# Patient Record
Sex: Female | Born: 1955 | Race: White | Hispanic: No | Marital: Married | Smoking: Never smoker
Health system: Southern US, Community
[De-identification: ages and names within clinical notes are randomized; demographics above are authoritative.]

## PROBLEM LIST (undated history)

## (undated) DIAGNOSIS — D649 Anemia, unspecified: Secondary | ICD-10-CM

## (undated) DIAGNOSIS — M1990 Unspecified osteoarthritis, unspecified site: Secondary | ICD-10-CM

## (undated) DIAGNOSIS — E079 Disorder of thyroid, unspecified: Secondary | ICD-10-CM

## (undated) DIAGNOSIS — T7840XA Allergy, unspecified, initial encounter: Secondary | ICD-10-CM

## (undated) DIAGNOSIS — M25473 Effusion, unspecified ankle: Secondary | ICD-10-CM

## (undated) DIAGNOSIS — Z87442 Personal history of urinary calculi: Secondary | ICD-10-CM

## (undated) DIAGNOSIS — G4730 Sleep apnea, unspecified: Secondary | ICD-10-CM

## (undated) DIAGNOSIS — E039 Hypothyroidism, unspecified: Secondary | ICD-10-CM

## (undated) DIAGNOSIS — E669 Obesity, unspecified: Secondary | ICD-10-CM

## (undated) DIAGNOSIS — L309 Dermatitis, unspecified: Secondary | ICD-10-CM

## (undated) DIAGNOSIS — C801 Malignant (primary) neoplasm, unspecified: Secondary | ICD-10-CM

## (undated) HISTORY — PX: COLONOSCOPY: SHX174

## (undated) HISTORY — PX: KNEE ARTHROSCOPY: SUR90

## (undated) HISTORY — DX: Unspecified osteoarthritis, unspecified site: M19.90

## (undated) HISTORY — DX: Allergy, unspecified, initial encounter: T78.40XA

## (undated) HISTORY — DX: Obesity, unspecified: E66.9

## (undated) HISTORY — DX: Sleep apnea, unspecified: G47.30

## (undated) HISTORY — DX: Disorder of thyroid, unspecified: E07.9

## (undated) HISTORY — PX: POLYPECTOMY: SHX149

---

## 1999-11-21 HISTORY — PX: BREAST BIOPSY: SHX20

## 2000-11-27 ENCOUNTER — Encounter: Admission: RE | Admit: 2000-11-27 | Discharge: 2000-11-27 | Payer: Self-pay | Admitting: Gynecology

## 2000-11-27 ENCOUNTER — Encounter: Payer: Self-pay | Admitting: Gynecology

## 2000-11-29 ENCOUNTER — Other Ambulatory Visit: Admission: RE | Admit: 2000-11-29 | Discharge: 2000-11-29 | Payer: Self-pay | Admitting: Gynecology

## 2002-05-27 ENCOUNTER — Encounter: Admission: RE | Admit: 2002-05-27 | Discharge: 2002-05-27 | Payer: Self-pay | Admitting: Gynecology

## 2002-05-27 ENCOUNTER — Encounter: Payer: Self-pay | Admitting: Gynecology

## 2002-06-04 ENCOUNTER — Other Ambulatory Visit: Admission: RE | Admit: 2002-06-04 | Discharge: 2002-06-04 | Payer: Self-pay | Admitting: Gynecology

## 2004-12-28 ENCOUNTER — Other Ambulatory Visit: Admission: RE | Admit: 2004-12-28 | Discharge: 2004-12-28 | Payer: Self-pay | Admitting: Gynecology

## 2005-01-11 ENCOUNTER — Encounter: Admission: RE | Admit: 2005-01-11 | Discharge: 2005-01-11 | Payer: Self-pay | Admitting: Gynecology

## 2007-07-09 ENCOUNTER — Encounter: Admission: RE | Admit: 2007-07-09 | Discharge: 2007-07-09 | Payer: Self-pay | Admitting: Gynecology

## 2007-07-11 ENCOUNTER — Other Ambulatory Visit: Admission: RE | Admit: 2007-07-11 | Discharge: 2007-07-11 | Payer: Self-pay | Admitting: Gynecology

## 2007-11-21 HISTORY — PX: CHOLECYSTECTOMY: SHX55

## 2007-11-27 ENCOUNTER — Inpatient Hospital Stay (HOSPITAL_COMMUNITY): Admission: EM | Admit: 2007-11-27 | Discharge: 2007-12-03 | Payer: Self-pay | Admitting: Emergency Medicine

## 2007-11-28 ENCOUNTER — Encounter (INDEPENDENT_AMBULATORY_CARE_PROVIDER_SITE_OTHER): Payer: Self-pay | Admitting: General Surgery

## 2008-01-03 ENCOUNTER — Encounter (INDEPENDENT_AMBULATORY_CARE_PROVIDER_SITE_OTHER): Payer: Self-pay | Admitting: *Deleted

## 2008-01-03 ENCOUNTER — Encounter: Admission: RE | Admit: 2008-01-03 | Discharge: 2008-01-03 | Payer: Self-pay | Admitting: General Surgery

## 2008-05-27 ENCOUNTER — Encounter: Admission: RE | Admit: 2008-05-27 | Discharge: 2008-05-27 | Payer: Self-pay | Admitting: General Surgery

## 2008-05-29 ENCOUNTER — Encounter (INDEPENDENT_AMBULATORY_CARE_PROVIDER_SITE_OTHER): Payer: Self-pay | Admitting: *Deleted

## 2008-05-29 ENCOUNTER — Encounter: Admission: RE | Admit: 2008-05-29 | Discharge: 2008-05-29 | Payer: Self-pay | Admitting: General Surgery

## 2008-09-22 ENCOUNTER — Encounter (INDEPENDENT_AMBULATORY_CARE_PROVIDER_SITE_OTHER): Payer: Self-pay | Admitting: *Deleted

## 2008-09-22 ENCOUNTER — Encounter: Admission: RE | Admit: 2008-09-22 | Discharge: 2008-09-22 | Payer: Self-pay | Admitting: Gastroenterology

## 2009-10-08 ENCOUNTER — Ambulatory Visit: Payer: Self-pay | Admitting: Gastroenterology

## 2009-10-08 DIAGNOSIS — R933 Abnormal findings on diagnostic imaging of other parts of digestive tract: Secondary | ICD-10-CM | POA: Insufficient documentation

## 2009-10-12 ENCOUNTER — Ambulatory Visit (HOSPITAL_COMMUNITY): Admission: RE | Admit: 2009-10-12 | Discharge: 2009-10-12 | Payer: Self-pay | Admitting: Gastroenterology

## 2009-11-20 HISTORY — PX: ROTATOR CUFF REPAIR: SHX139

## 2010-12-11 ENCOUNTER — Encounter: Payer: Self-pay | Admitting: Family Medicine

## 2010-12-11 ENCOUNTER — Encounter: Payer: Self-pay | Admitting: Gastroenterology

## 2011-01-24 ENCOUNTER — Encounter (INDEPENDENT_AMBULATORY_CARE_PROVIDER_SITE_OTHER): Payer: Self-pay | Admitting: *Deleted

## 2011-01-31 NOTE — Letter (Signed)
Summary: Pre Visit Letter Revised  [facility]  [address] [place], [place] [address]   Phone: [phone]  Fax: [phone]        01/24/2011 MRN: [id number] Carol [name] [address] [place], [place]  [address]             Procedure Date:  03/10/2011 @ 9:00   Direct colon-Dr. Christella Hartigan   Welcome to the Gastroenterology Division at [facility].    You are scheduled to see a nurse for your pre-procedure visit on 02/23/2011 at 4:30 on the 3rd floor at [facility], [address].  We ask that you try to arrive at our office 15 minutes prior to your appointment time to allow for check-in.  Please take a minute to review the attached form.  If you answer "Yes" to one or more of the questions on the first page, we ask that you call the person listed at your earliest opportunity.  If you answer "No" to all of the questions, please complete the rest of the form and bring it to your appointment.    Your nurse visit will consist of discussing your medical and surgical history, your immediate family medical history, and your medications.   If you are unable to list all of your medications on the form, please bring the medication bottles to your appointment and we will list them.  We will need to be aware of both prescribed and over the counter drugs.  We will need to know exact dosage information as well.    Please be prepared to read and sign documents such as consent forms, a financial agreement, and acknowledgement forms.  If necessary, and with your consent, a friend or relative is welcome to sit-in on the nurse visit with you.  Please bring your insurance card so that we may make a copy of it.  If your insurance requires a referral to see a specialist, please bring your referral form from your primary care physician.  No co-pay is required for this nurse visit.     If you cannot keep your appointment, please call [phone] to cancel or reschedule prior to your  appointment date.  This allows [place] the opportunity to schedule an appointment for another patient in need of care.    Thank you for choosing [facility] for your medical needs.  We appreciate the opportunity to care for you.  Please visit [place] at our website  to learn more about our practice.  Sincerely, The Gastroenterology Division

## 2011-02-21 ENCOUNTER — Telehealth: Payer: Self-pay | Admitting: Gastroenterology

## 2011-02-21 NOTE — Telephone Encounter (Signed)
Pt saw Dr Kinnie Scales three years ago and was told to have a colon in three years.  She will bring her records with her to the previsit appt on Friday

## 2011-02-23 ENCOUNTER — Ambulatory Visit (AMBULATORY_SURGERY_CENTER): Payer: Managed Care, Other (non HMO) | Admitting: *Deleted

## 2011-02-23 VITALS — Ht 62.0 in | Wt 278.6 lb

## 2011-02-23 DIAGNOSIS — Z1211 Encounter for screening for malignant neoplasm of colon: Secondary | ICD-10-CM

## 2011-02-23 MED ORDER — PEG-KCL-NACL-NASULF-NA ASC-C 100 G PO SOLR: ORAL | Status: DC

## 2011-03-05 ENCOUNTER — Telehealth: Payer: Self-pay | Admitting: Gastroenterology

## 2011-03-05 NOTE — Telephone Encounter (Signed)
Received records dated 3 years ago: Colonoscopy dated 06/2007 with Dr. Kinnie Scales; diverticulosis, hemorrhoids; 2 lipomas; 2 polyps (one was 10mm, adenomatous on pathology); recall set for 3 years

## 2011-03-07 ENCOUNTER — Encounter: Payer: Self-pay | Admitting: Gastroenterology

## 2011-03-10 ENCOUNTER — Ambulatory Visit (AMBULATORY_SURGERY_CENTER): Payer: Managed Care, Other (non HMO) | Admitting: Gastroenterology

## 2011-03-10 ENCOUNTER — Encounter: Payer: Self-pay | Admitting: Gastroenterology

## 2011-03-10 VITALS — BP 112/71 | HR 78 | Temp 99.0°F | Resp 18 | Ht 62.0 in | Wt 274.0 lb

## 2011-03-10 DIAGNOSIS — D126 Benign neoplasm of colon, unspecified: Secondary | ICD-10-CM

## 2011-03-10 DIAGNOSIS — Z86010 Personal history of colonic polyps: Secondary | ICD-10-CM

## 2011-03-10 DIAGNOSIS — D1779 Benign lipomatous neoplasm of other sites: Secondary | ICD-10-CM

## 2011-03-10 DIAGNOSIS — D128 Benign neoplasm of rectum: Secondary | ICD-10-CM

## 2011-03-10 DIAGNOSIS — Z1211 Encounter for screening for malignant neoplasm of colon: Secondary | ICD-10-CM

## 2011-03-10 DIAGNOSIS — K635 Polyp of colon: Secondary | ICD-10-CM

## 2011-03-10 DIAGNOSIS — D129 Benign neoplasm of anus and anal canal: Secondary | ICD-10-CM

## 2011-03-10 MED ORDER — SODIUM CHLORIDE 0.9 % IV SOLN: 500.0000 mL | INTRAVENOUS | Status: DC

## 2011-03-10 NOTE — Patient Instructions (Signed)
Two small polyps were removed from your colon today.  Three lipomas seen, that were previously noted by Dr. Kinnie Scales.  Given your personal history of adenomatous polyps, you will need a repeat colonoscopy in 5 years.  Information handout about polyps given to patient and their care partner.  Please call with any questions or concerns.

## 2011-03-13 ENCOUNTER — Telehealth: Payer: Self-pay

## 2011-03-13 NOTE — Telephone Encounter (Signed)
I called home # and the line was busy.  I called her cell home and ID was on the message, left message for pt to call if any questions or concerns.  MAW

## 2011-04-04 NOTE — Consult Note (Signed)
[name], [name] NO.:  [id number]   MEDICAL RECORD NO.:  [id number]          PATIENT TYPE:  EMS   LOCATION:  MAJO                         FACILITY:  [facility]   PHYSICIAN:  Thomas A. Cornett, M.D.DATE OF BIRTH:  01-01-1956   DATE OF CONSULTATION:  11/27/2007  DATE OF DISCHARGE:                                 CONSULTATION   REASON FOR CONSULTATION:  Right upper quadrant pain.   HISTORY OF PRESENT ILLNESS:  The patient is a 55 year old female with a  7-day history of mild right upper quadrant and epigastric pain.  Last  night it became severe with severe, stabbing, sharp, right upper  quadrant pain radiating through her back into her right shoulder on a  scale of 10 to 10 associated with nausea and vomiting.  Nothing made it  better or worse.  She came to the emergency room where ultrasound  revealed thickened, distended gallbladder with gallstones worrisome for  acute cholecystitis.  I was asked to see the patient at the request of  the emergency room doctor for the above.  She had mild pain around [place]  Year's day but it has gotten much worse, and she thought this was going  to be secondary to her reflux.   PAST MEDICAL HISTORY:  1. Questionable borderline diabetes mellitus, diet controlled.  2. Gastroesophageal reflux disease.   PAST SURGICAL HISTORY:  History of right breast biopsy for benign  disease.   FAMILY HISTORY:  Noncontributory.   SOCIAL HISTORY:  Denies tobacco or alcohol use.  She is married.   DRUG ALLERGIES:  None.   MEDICATIONS:  Currently Vicodin given by her primary care doctor  yesterday for her epigastric pain.   REVIEW OF SYSTEMS:  A 15-point review of systems otherwise is negative  except for that stated above.   PHYSICAL EXAMINATION:  VITAL SIGNS:  Temperature 97.  Pulse 69.  Blood  pressure 141/80.  GENERAL APPEARANCE:  Pleasant female in no apparent distress.  HEENT:  Extraocular movements are intact.  No evidence of  scleral  icterus.  NECK:  Supple, nontender.  Trachea midline.  No mass.  CHEST:  Lungs sound clear bilaterally.  Chest wall motion normal.  CARDIOVASCULAR:  Regular rate and rhythm without rub, murmur or gallop.  EXTREMITIES:  Well perfused and warm.  ABDOMEN:  Tender right upper quadrant.  Positive Murphy's sign.  No  mass.  No hernia.  EXTREMITIES:  Muscle tone normal.  Range of motion normal.  NEUROLOGIC:  Motor and sensory function grossly intact.  Glasgow Coma  Scale 15.   DIAGNOSTIC STUDIES:  Ultrasound reveals gallstones and distended,  thickened gallbladder worrisome for acute cholecystitis.  She has a  white count of 12,800, hemoglobin of 14.6.  Platelet count is 140,000.  Urinalysis is within normal limits.  She has a sodium of 132, a glucose  of 185, BUN of 9 and creatinine of 0.6.  Liver function studies appear  to be within normal limits.  Lipase is 23.   Please note, on ultrasound there was a questionable left liver mass  measuring roughly 1  cm.   IMPRESSION:  1. Acute cholecystitis.  2. Borderline diabetes mellitus.  3. Questionable left liver mass by ultrasound.   PLAN:  Ms. Klassen does have acute cholecystitis.  We will admit her for  IV fluids, antibiotics, and laparoscopic cholecystectomy by Dr.  Abbey Chatters who is the doctor of the week.  I told her about this  possibility of having a left liver mass, and that will need to be  further evaluated with laparoscopy and possible CT scan.  She  understands the above and agrees.      Thomas A. Cornett, M.D.  Electronically Signed     TAC/MEDQ  D:  11/27/2007  T:  11/27/2007  Job:  [id number]   cc:   Paula Libra, MD

## 2011-04-04 NOTE — Discharge Summary (Signed)
NAMEMARIUM, RAGAN NO.:  [id number]   MEDICAL RECORD NO.:  [id number]          PATIENT TYPE:  INP   LOCATION:  5018                         FACILITY:  [facility]   PHYSICIAN:  Ardeth Sportsman, MD     DATE OF BIRTH:  08-Jul-1956   DATE OF ADMISSION:  11/27/2007  DATE OF DISCHARGE:  12/03/2007                               DISCHARGE SUMMARY   DISCHARGE PHYSICIAN:  Dr. Michaell Cowing.   OPERATIVE PHYSICIAN:  Dr. Abbey Chatters.   CHIEF COMPLAINT/REASON FOR ADMISSION:  Ms. Davidson is a 51-year female  patient with 7 days of mild right upper quadrant epigastric pain.  Twenty four hours prior to presentation, the patient developed  unrelenting, severe pain of a stabbing sharp quality in the right upper  quadrant radiating through the back and into the shoulder, rated as  10/10.  She was experiencing nausea and vomiting, was unable to obtain  relief presented to the ER where she was found to have significant right  upper quadrant pain on exam.  Her white count was 12,800.  Her LFTs were  normal and ultrasound of the abdomen did reveal gallstones and a  distended thickened gallbladder worse for acute cholecystitis.  Therefore, the patient was admitted with a diagnosis of cholelithiasis  and acute cholecystitis.  In addition, there was a questionable left  liver mass seen on ultrasound.   HOSPITAL COURSE:  The patient was admitted to the general floor where  she was placed on bowel rest, IV fluids and empiric antibiotic therapy  with Cipro to cover enteric pathogens.  Subsequently, care was assumed  by Dr. Abbey Chatters who discussed risks and benefits of surgery with the  patient, and the patient agreed to proceed with surgical intervention.   November 28, 2007, the patient was taken to the OR by Dr. Abbey Chatters where  she underwent an attempted laparoscopic cholecystectomy.  She was found  to have a massive (near football size) gallbladder, so therefore she was  converted to an open  cholecystectomy.  During the procedure, attempt was  made to fully exam the liver to determine possible etiology for the  unusual finding on ultrasound on the liver, but due to the patient's  body habitus, size of the gallbladder and inflammatory changes and acute  cholecystitis, complete visualization of the liver was unable to be  achieved.  The patient otherwise tolerated open procedure well, was  initially sent to the post anesthesia care unit and then back to the  general floor to recover.  Intraoperatively, she had one Blake drain to  the JP bulb suction placed.   In the postoperative period, the patient did well.  Because she  experienced an open procedure, her diet was advanced slowly.  JP drain  remained stable without any bilious output.  She was having  serosanguineous output but a large volume.  She remained afebrile on  Cipro.  Vital signs were otherwise stable.  The pathology from the  procedure showed no evidence of malignancy and she did have fatty liver  changes noted with a sampling of liver tissue that was sent  down with  the pathology.  By November 30, 2007, the patient's white count had  normalized at 6900.   By postop day #4, the patient was ambulating without difficulty.  She  was tolerating Vicodin as needed.  She was given Toradol p.r.n. for pain  management.  At this point, she was tolerating a full liquid diet.  By  postop her day #5, date of discharge, the patient's vital signs were  stable.  Her abdominal exam was benign.  Her staples were clean, dry and  intact in the right upper quadrant umbilical region.  JP drainage was  still serosanguineous in large volumes.  This was left in place.  She  was tolerating a solid diet and passing bowel movements and was  otherwise deemed appropriate for discharge home.   The patient reports that prior to admission she had been on the Nutri-  System nutritional program and had experienced a 60-pound weight loss,  advised  the patient to wait a minimum of 2 weeks, preferably 4 weeks  before reinitiating calorie restricted diet to assure she had adequate  wound healing.   DISCHARGE DIAGNOSES:  1. Acute cholecystitis and cholelithiasis.  2. Status post laparoscopic converted to open cholecystectomy      secondary to massive gallbladder size.  3. Questionable left liver mass seen on ultrasound, unable to      visualize in OR.  4. Obesity.   DISCHARGE MEDICATIONS:  The patient was not on any medications prior to  presenting the hospital.   NEW MEDICATIONS:  1. Vicodin 5/325 1-2 tablets q.4 h p.r.n. pain.  2. Over-the-counter ibuprofen 1-4 tablets q.8 h p.r.n. pain, take with      food.  3. Over-the-counter stool softener twice daily to prevent      constipation.  Stop if you develop diarrhea.  4. Over-the-counter MiraLax one-quarter to one capsule daily to      prevent constipation.   DISCHARGE INSTRUCTIONS:  1. Diet:  No restrictions other than recommendations to not resume      Nutri-System until healed from surgery.  2. Activity:  Increase activity slowly.  May walk up steps.  Sponge      bathe only until drain is removed.  No lifting greater than 10      pounds for 6 weeks from date of surgery.  No driving for 4 weeks or      until released by Dr. Abbey Chatters.  Return to work in 6 weeks from      date of surgery.  Please see attached work note.  Final      determination per Dr. Abbey Chatters.   FOLLOW-UP APPOINTMENTS:  She is to see Dr. Abbey Chatters on Friday, January  16 to 4:00 p.m. for evaluation for a JP drain removal as well as for  removal of wound staples.   OTHER INSTRUCTIONS:  1. Wound care.  The patient is to empty the JP drain and record and      bring this information to her follow-up visit.  She is to pat her      staple area dry.  2.  She is to call the surgeon if she develops:      a.     Fever greater than 101 degrees Fahrenheit.      b.     New or increased belly pain.      c.      Redness or drainage from wounds or change in the character  of the drain, output or color from the JP drain.      d.     Nausea, vomiting or diarrhea.     Allison L. Eliott Nine, MD  Electronically Signed   ALE/MEDQ  D:  12/03/2007  T:  12/03/2007  Job:  [id number]   cc:   Adolph Pollack, M.D.

## 2011-04-04 NOTE — Op Note (Signed)
Carol [name], [name]:  [id number]   MEDICAL RECORD NO.:  [id number]          PATIENT TYPE:  INP   LOCATION:  5018                         FACILITY:  [facility]   PHYSICIAN:  Adolph Pollack, M.D.DATE OF BIRTH:  [birth year]   DATE OF PROCEDURE:  11/28/2007  DATE OF DISCHARGE:                               OPERATIVE REPORT   PREOPERATIVE DIAGNOSIS:  Acute cholecystitis.   POSTOPERATIVE DIAGNOSIS:  Acute cholecystitis.   PROCEDURE:  Laparoscopic converted to open cholecystectomy.   SURGEON:  Adolph Pollack, M.D.   ASSISTANT:  Violeta Gelinas, M.D.   ANESTHESIA:  General.   INDICATIONS:  [name] is a 55 year old female who had a 7-day history  of mild right upper quadrant pain that became very severe the night of  her admission two nights ago.  She subsequently was admitted and  diagnosed with acute cholecystitis and is now brought to the operating  room for cholecystectomy.  Procedure and risks have been discussed with  her preoperatively.   TECHNIQUE:  She is brought to the operating room and placed on the  operating table.  General anesthetic was administered.  The abdominal  wall was sterilely prepped and draped.  Small subumbilical incision was  made through skin, subcutaneous tissue, fascia and peritoneum entering  the peritoneal cavity under direct vision.  A pursestring suture of 0-0  Vicryl placed around the fascial edges.  A Hasson trocar was introduced  into the peritoneal cavity and pneumoperitoneum was created by  insufflation of CO2 gas.   Next laparoscope was introduced and she had a very large distended very  edematous and erythematous gallbladder.  A 10 mm trocar was placed in  the epigastric incision and two 5 mm trocars placed in right lateral  abdomen.  She is placed in reverse Trendelenburg position and right side  tilted slightly up.   The gallbladder was then decompressed of very thick bile.  The fundus  was grasped and  retracted toward the right shoulder.  There was  significant adhesions between the omentum and the gallbladder and using  hydrodissection and careful blunt dissection on the gallbladder I slowly  took these down.  There is bleeding from these.  We continued to do this  to a point that there was a fairly thick rind versus duodenum.  I could  not separate this safely.  At this point I decided to convert to an open  procedure.   I removed the 10 mm trocar in epigastrium and made a right subcostal  incision dividing the skin, subcutaneous tissue and fascial layers,  muscle and peritoneum entering the peritoneal cavity.  Bookwalter  retractor was used for exposure.   Following this we inspected the area and noted that the tissue that was  extremely adherent to the infundibulum and part of the body was just  inflammatory peel.  This was taken down carefully bluntly.  There was  very firm induration down by the infundibular area.   I then inspected the dome of the gallbladder began at the fundus and  which was partially intrahepatic and using  electrocautery was able to  dissect this free from the liver controlling bleeding with  electrocautery and direct pressure applied laparotomy packs.  I used  very careful blunt dissection and when I got down to the infundibulum I  was able to identify the infundibulum and retract it laterally.  I then  saw some small anterior and posterior branches of the cystic artery  which were clipped and divided.  This left me with a singular short  cystic duct structure and I could identify the common bile duct as well.  Her liver function tests were normal.  I felt I had adequate  visualization of her anatomy.  The cystic duct was then clipped and  ligated with Vicryl suture and divided.  The gallbladder was extremely  large and removed from the field.   Following this I irrigated out the hepatic fossa and controlled bleeding  with electrocautery.  Surgicel was  then placed.  There was no bile leak  or further bleeding.   I removed the 5 mm trocars and then through the lateral 5 mm incision  placed 19 Blake drain.  I put it into the near the gallbladder fossa and  tied it and anchored it to the skin with 3-0 nylon suture.  I then  removed the subumbilical trocar and I closed the fascial defect by tying  up and tying down the pursestring suture.  I evacuated as much  irrigation fluid as possible and requested needle, sponge, instrument  count which was reportedly correct.   I then closed the fascia in two layers using running #1 PDS suture.  Subcutaneous tissue was irrigated.  All skin incisions were closed with  staples and sterile dressings were applied.   She tolerated procedure without apparent complications and was taken to  recovery in satisfactory condition.      Adolph Pollack, M.D.  Electronically Signed     TJR/MEDQ  D:  11/28/2007  T:  11/28/2007  Job:  [id number]

## 2011-08-10 LAB — BASIC METABOLIC PANEL
BUN: 6
GFR calc Af Amer: >60
GFR calc non Af Amer: >60
Potassium: 3.5
Sodium: 140

## 2011-08-10 LAB — COMPREHENSIVE METABOLIC PANEL
ALT: 19
AST: 17
Alkaline Phosphatase: 92
BUN: 3 — ABNORMAL LOW
CO2: 22
Chloride: 101
Chloride: 102
Creatinine, Ser: 0.6
GFR calc non Af Amer: >60
Glucose, Bld: 148 — ABNORMAL HIGH
Potassium: 3.7
Total Bilirubin: 0.7
Total Bilirubin: 0.8

## 2011-08-10 LAB — CBC
HCT: 28.6 — ABNORMAL LOW
HCT: 31.4 — ABNORMAL LOW
HCT: 41.7
Hemoglobin: 10 — ABNORMAL LOW
MCV: 88.7
Platelets: 186
Platelets: 240
RBC: 3.52 — ABNORMAL LOW
RBC: 4.74
RDW: 12.9
WBC: 12.8 — ABNORMAL HIGH
WBC: 8.5

## 2011-08-10 LAB — DIFFERENTIAL
Basophils Absolute: 0
Basophils Absolute: 0
Basophils Relative: 0
Basophils Relative: 0
Eosinophils Relative: 0
Monocytes Absolute: 0.4
Neutro Abs: 11.5 — ABNORMAL HIGH
Neutro Abs: 5
Neutrophils Relative %: 73

## 2011-08-10 LAB — URINALYSIS, ROUTINE W REFLEX MICROSCOPIC
Ketones, ur: 40 — AB
Nitrite: NEGATIVE
Urobilinogen, UA: 1

## 2011-08-10 LAB — POCT CARDIAC MARKERS
Operator id: 282201
Troponin i, poc: <0.05

## 2011-08-10 LAB — LIPASE, BLOOD
Lipase: 20
Lipase: 23

## 2011-08-17 NOTE — Progress Notes (Signed)
Addended by: Maple Hudson on: 08/17/2011 06:37 PM   Modules accepted: Orders, Level of Service

## 2012-01-02 ENCOUNTER — Other Ambulatory Visit: Payer: Self-pay | Admitting: Gynecology

## 2012-07-09 ENCOUNTER — Encounter: Payer: Managed Care, Other (non HMO) | Attending: Family Medicine | Admitting: *Deleted

## 2012-07-09 ENCOUNTER — Encounter: Payer: Self-pay | Admitting: *Deleted

## 2012-07-09 DIAGNOSIS — E119 Type 2 diabetes mellitus without complications: Secondary | ICD-10-CM | POA: Insufficient documentation

## 2012-07-09 DIAGNOSIS — Z713 Dietary counseling and surveillance: Secondary | ICD-10-CM | POA: Insufficient documentation

## 2012-07-09 NOTE — Patient Instructions (Addendum)
Plan: Aim for 3 Carbs (45 grams) per meal +/- 1 either way Aim for 0-1 Carb per snack if hungry Consider reading food labels for total carb and fat grams of foods Consider ways to increase activity level in small time increments daily, to be discussed in more detail at next visit

## 2012-07-09 NOTE — Progress Notes (Signed)
  Medical Nutrition Therapy:  Appt start time: 0800 end time:  0900.  Assessment:  Primary concerns today: patient here for education on pre-diabetes. 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 She lives with her husband and 2 grown children along with a 57 year old grandchild. She states she does have a meter and is testing every AM with current average range of 135-150 mg/dl. No activity at this time. She is having foot pain on one foot so walking is painful right now. She tends to eat more on the weekends as she is less busyl  MEDICATIONS: see list  Current A1c on 05/29/2012 = 6.6%  DIETARY INTAKE: Usual eating pattern includes 3 meals and 0-2 snacks per day.  Everyday foods include meats, green vegetables, pasta.  Avoided foods include milk, cheese, beans some vegetables.    24-hr recall:  B ( AM): [employer] cereal with fruit OR scrambled eggs with 2 toast with butter OR 2 toast and butter, water Snk ( AM): not usually unless brought into office (donuts etc)  L ( PM): bring left overs from dinner: casserole, sandwich, etc, occasionally fruit Snk ( PM): PNB crackers or pretzels from vending machine and every other day a Diet soda D ( PM): meat or pasta, bread during the week, on weekends adds vegetables Snk ( PM): occasionally ice cream Beverages: water and diet soda occasionally  Usual physical activity: none due to work schedule and foot pain   Estimated energy needs: 1500 calories 170 g carbohydrates 112 g protein 42 g fat  Progress Towards Goal(s):  In progress.   Nutritional Diagnosis:  NI-1.5 Excessive energy intake As related to activity level.  As evidenced by BMI of 49.9.    Intervention:  Nutrition counseling and diabetes education initiated. Discussed basic physiology of diabetes, SMBG and rationale of checking BG at alternate times of day, A1c, Carb Counting and reading food labels, and benefits  of increased activity.  Plan: Aim for 3 Carbs (45 grams) per meal +/- 1 either way Aim for 0-1 Carb per snack if hungry Consider reading food labels for total carb and fat grams of foods Consider ways to increase activity level in small time increments daily, to be discussed in more detail at next visit  Handouts given during visit include: Living Well with Diabetes Carb Counting and Food Label handouts Meal Plan Card Arm Chair Exercise Book and Calorie King APP information  Monitoring/Evaluation:  Dietary intake, exercise, reading food labels, and body weight in 4 week(s).

## 2012-08-06 ENCOUNTER — Ambulatory Visit: Payer: Managed Care, Other (non HMO) | Admitting: *Deleted

## 2013-01-06 ENCOUNTER — Other Ambulatory Visit: Payer: Self-pay | Admitting: Family Medicine

## 2013-01-06 DIAGNOSIS — Z1231 Encounter for screening mammogram for malignant neoplasm of breast: Secondary | ICD-10-CM

## 2013-01-09 ENCOUNTER — Other Ambulatory Visit: Payer: Self-pay | Admitting: Gynecology

## 2013-01-15 ENCOUNTER — Ambulatory Visit: Payer: Managed Care, Other (non HMO)

## 2013-01-16 ENCOUNTER — Encounter (HOSPITAL_BASED_OUTPATIENT_CLINIC_OR_DEPARTMENT_OTHER): Payer: Self-pay | Admitting: *Deleted

## 2013-01-16 NOTE — Progress Notes (Signed)
To [facility] at 0615- [place],Ekg on arrival-Npo after Mn-may take levothyroxine with small amt water that am.Instructed to bring cpap and mask.

## 2013-01-20 ENCOUNTER — Ambulatory Visit
Admission: RE | Admit: 2013-01-20 | Discharge: 2013-01-20 | Disposition: A | Discharge status: Home / Self Care | Payer: Managed Care, Other (non HMO) | Source: Ambulatory Visit | Attending: Family Medicine | Admitting: Family Medicine

## 2013-01-20 DIAGNOSIS — Z1231 Encounter for screening mammogram for malignant neoplasm of breast: Secondary | ICD-10-CM

## 2013-01-21 ENCOUNTER — Encounter (HOSPITAL_BASED_OUTPATIENT_CLINIC_OR_DEPARTMENT_OTHER): Payer: Self-pay | Admitting: *Deleted

## 2013-01-21 ENCOUNTER — Ambulatory Visit (HOSPITAL_BASED_OUTPATIENT_CLINIC_OR_DEPARTMENT_OTHER)
Admission: RE | Admit: 2013-01-21 | Discharge: 2013-01-21 | Disposition: A | Discharge status: Home / Self Care | Payer: Managed Care, Other (non HMO) | Source: Ambulatory Visit | Attending: Gynecology | Admitting: Gynecology

## 2013-01-21 ENCOUNTER — Encounter (HOSPITAL_BASED_OUTPATIENT_CLINIC_OR_DEPARTMENT_OTHER)
Admission: RE | Disposition: A | Discharge status: Home / Self Care | Payer: Self-pay | Source: Ambulatory Visit | Attending: Gynecology

## 2013-01-21 ENCOUNTER — Other Ambulatory Visit: Payer: Self-pay | Admitting: Family Medicine

## 2013-01-21 ENCOUNTER — Encounter (HOSPITAL_BASED_OUTPATIENT_CLINIC_OR_DEPARTMENT_OTHER): Payer: Self-pay | Admitting: Anesthesiology

## 2013-01-21 ENCOUNTER — Ambulatory Visit (HOSPITAL_BASED_OUTPATIENT_CLINIC_OR_DEPARTMENT_OTHER): Payer: Managed Care, Other (non HMO) | Admitting: Anesthesiology

## 2013-01-21 DIAGNOSIS — E119 Type 2 diabetes mellitus without complications: Secondary | ICD-10-CM | POA: Insufficient documentation

## 2013-01-21 DIAGNOSIS — Z6841 Body Mass Index (BMI) 40.0 and over, adult: Secondary | ICD-10-CM | POA: Insufficient documentation

## 2013-01-21 DIAGNOSIS — N950 Postmenopausal bleeding: Secondary | ICD-10-CM | POA: Insufficient documentation

## 2013-01-21 DIAGNOSIS — N840 Polyp of corpus uteri: Secondary | ICD-10-CM | POA: Insufficient documentation

## 2013-01-21 DIAGNOSIS — Z79899 Other long term (current) drug therapy: Secondary | ICD-10-CM | POA: Insufficient documentation

## 2013-01-21 DIAGNOSIS — E039 Hypothyroidism, unspecified: Secondary | ICD-10-CM | POA: Insufficient documentation

## 2013-01-21 DIAGNOSIS — R9389 Abnormal findings on diagnostic imaging of other specified body structures: Secondary | ICD-10-CM | POA: Insufficient documentation

## 2013-01-21 DIAGNOSIS — R928 Other abnormal and inconclusive findings on diagnostic imaging of breast: Secondary | ICD-10-CM

## 2013-01-21 DIAGNOSIS — G4730 Sleep apnea, unspecified: Secondary | ICD-10-CM | POA: Insufficient documentation

## 2013-01-21 HISTORY — DX: Hypothyroidism, unspecified: E03.9

## 2013-01-21 HISTORY — PX: DILATATION & CURRETTAGE/HYSTEROSCOPY WITH RESECTOCOPE: SHX5572

## 2013-01-21 LAB — GLUCOSE, CAPILLARY: Glucose-Capillary: 139 mg/dL — ABNORMAL HIGH (ref 70–99)

## 2013-01-21 LAB — POCT I-STAT 4, (NA,K, GLUC, HGB,HCT): Hemoglobin: 12.9 g/dL (ref 12.0–15.0)

## 2013-01-21 SURGERY — DILATATION & CURETTAGE/HYSTEROSCOPY WITH RESECTOCOPE
Anesthesia: Monitor Anesthesia Care | Site: Uterus | Wound class: Clean Contaminated

## 2013-01-21 MED ORDER — FENTANYL CITRATE 0.05 MG/ML IJ SOLN
25.0000 ug | INTRAMUSCULAR | Status: DC | PRN
  Administered 2013-01-21: 25 ug via INTRAVENOUS
  Filled 2013-01-21: qty 1

## 2013-01-21 MED ORDER — PROPOFOL 10 MG/ML IV EMUL
INTRAVENOUS | Status: DC | PRN
  Administered 2013-01-21: 50 ug/kg/min via INTRAVENOUS

## 2013-01-21 MED ORDER — MEPERIDINE HCL 25 MG/ML IJ SOLN
6.2500 mg | INTRAMUSCULAR | Status: DC | PRN
  Filled 2013-01-21: qty 1

## 2013-01-21 MED ORDER — ONDANSETRON HCL 4 MG/2ML IJ SOLN
INTRAMUSCULAR | Status: DC | PRN
  Administered 2013-01-21: 4 mg via INTRAVENOUS

## 2013-01-21 MED ORDER — FENTANYL CITRATE 0.05 MG/ML IJ SOLN
INTRAMUSCULAR | Status: DC | PRN
  Administered 2013-01-21 (×5): 12.5 ug via INTRAVENOUS
  Administered 2013-01-21: 25 ug via INTRAVENOUS
  Administered 2013-01-21 (×5): 12.5 ug via INTRAVENOUS
  Administered 2013-01-21: 25 ug via INTRAVENOUS
  Administered 2013-01-21 (×2): 12.5 ug via INTRAVENOUS

## 2013-01-21 MED ORDER — MIDAZOLAM HCL 5 MG/5ML IJ SOLN
INTRAMUSCULAR | Status: DC | PRN
  Administered 2013-01-21 (×4): 1 mg via INTRAVENOUS

## 2013-01-21 MED ORDER — LACTATED RINGERS IV SOLN
INTRAVENOUS | Status: DC
  Administered 2013-01-21: 07:00:00 via INTRAVENOUS
  Filled 2013-01-21: qty 1000

## 2013-01-21 MED ORDER — LIDOCAINE HCL (PF) 1 % IJ SOLN
INTRAMUSCULAR | Status: DC | PRN
  Administered 2013-01-21: 10 mL

## 2013-01-21 MED ORDER — KETOROLAC TROMETHAMINE 30 MG/ML IJ SOLN
INTRAMUSCULAR | Status: DC | PRN
  Administered 2013-01-21: 30 mg via INTRAVENOUS

## 2013-01-21 MED ORDER — LACTATED RINGERS IV SOLN
INTRAVENOUS | Status: DC | PRN
  Administered 2013-01-21 (×2): via INTRAVENOUS

## 2013-01-21 MED ORDER — PROMETHAZINE HCL 25 MG/ML IJ SOLN
6.2500 mg | INTRAMUSCULAR | Status: DC | PRN
  Filled 2013-01-21: qty 1

## 2013-01-21 MED ORDER — LACTATED RINGERS IV SOLN
INTRAVENOUS | Status: DC
  Filled 2013-01-21: qty 1000

## 2013-01-21 MED ORDER — PROPOFOL 10 MG/ML IV BOLUS
INTRAVENOUS | Status: DC | PRN
  Administered 2013-01-21: 20 mg via INTRAVENOUS

## 2013-01-21 MED ORDER — GLYCINE 1.5 % IR SOLN
Status: DC | PRN
  Administered 2013-01-21: 6000 mL

## 2013-01-21 SURGICAL SUPPLY — 29 items
CANISTER SUCTION 2500CC (MISCELLANEOUS) ×2 IMPLANT
CATH ROBINSON RED A/P 16FR (CATHETERS) IMPLANT
CLOTH BEACON ORANGE TIMEOUT ST (SAFETY) ×2 IMPLANT
CORD ACTIVE DISPOSABLE (ELECTRODE) ×1
CORD ELECTRO ACTIVE DISP (ELECTRODE) ×1 IMPLANT
COVER TABLE BACK 60X90 (DRAPES) ×2 IMPLANT
DRAPE CAMERA CLOSED 9X96 (DRAPES) ×2 IMPLANT
DRAPE LG THREE QUARTER DISP (DRAPES) ×2 IMPLANT
ELECT LOOP GYNE PRO 24FR (CUTTING LOOP) ×2
ELECT REM PT RETURN 9FT ADLT (ELECTROSURGICAL) ×2
ELECT VAPORTRODE GRVD BAR (ELECTRODE) IMPLANT
ELECTRODE LOOP GYNE PRO 24FR (CUTTING LOOP) ×1 IMPLANT
ELECTRODE REM PT RTRN 9FT ADLT (ELECTROSURGICAL) ×1 IMPLANT
GLOVE BIO SURGEON STRL SZ8 (GLOVE) ×4 IMPLANT
GLOVE INDICATOR 6.0 STRL GRN (GLOVE) ×1 IMPLANT
GLOVE INDICATOR 6.5 STRL GRN (GLOVE) ×1 IMPLANT
GOWN STRL NON-REIN LRG LVL3 (GOWN DISPOSABLE) ×2 IMPLANT
GOWN STRL REIN XL XLG (GOWN DISPOSABLE) ×2 IMPLANT
LEGGING LITHOTOMY PAIR STRL (DRAPES) ×2 IMPLANT
NDL SPNL 22GX3.5 QUINCKE BK (NEEDLE) ×1 IMPLANT
NEEDLE SPNL 22GX3.5 QUINCKE BK (NEEDLE) ×2 IMPLANT
PACK BASIN DAY SURGERY FS (CUSTOM PROCEDURE TRAY) ×2 IMPLANT
PAD OB MATERNITY 4.3X12.25 (PERSONAL CARE ITEMS) ×2 IMPLANT
PAD PREP 24X48 CUFFED NSTRL (MISCELLANEOUS) ×2 IMPLANT
SYR CONTROL 10ML LL (SYRINGE) ×2 IMPLANT
TOWEL OR 17X24 6PK STRL BLUE (TOWEL DISPOSABLE) ×2 IMPLANT
TRAY DSU PREP LF (CUSTOM PROCEDURE TRAY) ×2 IMPLANT
TUBING HYDROFLEX HYSTEROSCOPY (TUBING) ×2 IMPLANT
WATER STERILE IRR 500ML POUR (IV SOLUTION) ×2 IMPLANT

## 2013-01-21 NOTE — Anesthesia Preprocedure Evaluation (Addendum)
Anesthesia Evaluation  Patient identified by MRN, date of birth, ID band Patient awake    Reviewed: Allergy & Precautions, H&P , NPO status , Patient's Chart, lab work & pertinent test results  Airway Mallampati: II TM Distance: >3 FB Neck ROM: Full    Dental no notable dental hx.    Pulmonary sleep apnea and Continuous Positive Airway Pressure Ventilation ,  breath sounds clear to auscultation  Pulmonary exam normal       Cardiovascular negative cardio ROS  Rhythm:Regular Rate:Normal     Neuro/Psych negative neurological ROS  negative psych ROS   GI/Hepatic negative GI ROS, Neg liver ROS,   Endo/Other  diabetes, Type 2, Oral Hypoglycemic AgentsHypothyroidism Morbid obesity  Renal/GU negative Renal ROS  negative genitourinary   Musculoskeletal negative musculoskeletal ROS (+)   Abdominal   Peds negative pediatric ROS (+)  Hematology negative hematology ROS (+)   Anesthesia Other Findings   Reproductive/Obstetrics negative OB ROS                          Anesthesia Physical Anesthesia Plan  ASA: III  Anesthesia Plan: MAC   Post-op Pain Management:    Induction:   Airway Management Planned: Simple Face Mask  Additional Equipment:   Intra-op Plan:   Post-operative Plan:   Informed Consent: I have reviewed the patients History and Physical, chart, labs and discussed the procedure including the risks, benefits and alternatives for the proposed anesthesia with the patient or authorized representative who has indicated his/her understanding and acceptance.   Dental advisory given  Plan Discussed with:   Anesthesia Plan Comments:         Anesthesia Quick Evaluation

## 2013-01-21 NOTE — Anesthesia Procedure Notes (Signed)
Procedure Name: MAC Date/Time: 01/21/2013 7:46 AM Performed by: Jessica Priest Pre-anesthesia Checklist: Patient identified, Emergency Drugs available, Suction available, Patient being monitored and Timeout performed Patient Re-evaluated:Patient Re-evaluated prior to inductionOxygen Delivery Method: Simple face mask Preoxygenation: Pre-oxygenation with 100% oxygen Intubation Type: IV induction Airway Equipment and Method: Oral airway Placement Confirmation: positive ETCO2

## 2013-01-21 NOTE — Anesthesia Postprocedure Evaluation (Signed)
  Anesthesia Post-op Note  Patient: Carol [name]  Procedure(s) Performed: Procedure(s) (LRB): DILATATION & CURETTAGE/HYSTEROSCOPY WITH RESECTOCOPE AND RESECTION OF ENDOMETRIAL (N/A)  Patient Location: PACU  Anesthesia Type: MAC  Level of Consciousness: awake and alert   Airway and Oxygen Therapy: Patient Spontanous Breathing  Post-op Pain: mild  Post-op Assessment: Post-op Vital signs reviewed, Patient's Cardiovascular Status Stable, Respiratory Function Stable, Patent Airway and No signs of Nausea or vomiting  Last Vitals:  Filed Vitals:   01/21/13 0820  BP: 142/80  Pulse: 72  Temp: 36.5 C  Resp: 12    Post-op Vital Signs: stable   Complications: No apparent anesthesia complications

## 2013-01-21 NOTE — Transfer of Care (Signed)
Immediate Anesthesia Transfer of Care Note  Patient: Carol [name]  Procedure(s) Performed: Procedure(s) (LRB): DILATATION & CURETTAGE/HYSTEROSCOPY WITH RESECTOCOPE AND RESECTION OF ENDOMETRIAL (N/A)  Patient Location: PACU  Anesthesia Type: General  Level of Consciousness: awake, sedated, patient cooperative and responds to stimulation  Airway & Oxygen Therapy: Patient Spontanous Breathing and Patient connected to  O2  Post-op Assessment: Report given to PACU RN, Post -op Vital signs reviewed and stable and Patient moving all extremities  Post vital signs: Reviewed and stable  Complications: No apparent anesthesia complications

## 2013-01-22 ENCOUNTER — Encounter (HOSPITAL_BASED_OUTPATIENT_CLINIC_OR_DEPARTMENT_OTHER): Payer: Self-pay | Admitting: Gynecology

## 2013-01-22 NOTE — Op Note (Signed)
Carol [name], Carol [name] NO.:  [id number]  MEDICAL RECORD NO.:  [id number]  LOCATION:                                 FACILITY:  PHYSICIAN:  Gretta Cool, M.D. DATE OF BIRTH:  Apr 24, 1956  DATE OF PROCEDURE:  01/21/2013 DATE OF DISCHARGE:                              OPERATIVE REPORT   PREOPERATIVE DIAGNOSIS:  Postmenopausal bleeding with endometrial thickening of 9 mm by ultrasound.  POSTOPERATIVE DIAGNOSIS:  Postmenopausal bleeding with endometrial thickening of 9 mm by ultrasound, high risk for endometrial cancer for history of diabetes, BMI 47.  PROCEDURE:  D and C, hysteroscopy.  SURGEON:  Gretta Cool, M.D.  ANESTHESIA:  IV sedation and paracervical block.  DESCRIPTION OF PROCEDURE:  Under excellent general anesthesia as above with the patient prepped and draped as a sterile field, lithotomy position, the cervix was grasped with single-tooth tenaculum and pulled down into view.  It was progressively dilated with series of Pratt dilators to accommodate 7-mm resectoscope.  The resectoscope was then introduced and the cavity examined.  There were thickened areas compatible with polyp formation on anterior and posterior walls. Nothing grossly compatible with endometrial cancer or more.  At this point, the endometrium was sampled and all of the thickened areas and eventually all of the endometrial tissue was removed and submitted for the D and C specimen.  At this point, there was no significant bleeding. The patient was given Toradol 30 mg IV with reduced pressure.  There was no significant bleeding.  Procedure was then terminated without complication.  The patient was returned to the recovery room in excellent condition.          ______________________________ Gretta Cool, M.D.     CWL/MEDQ  D:  01/21/2013  T:  01/21/2013  Job:  [id number]  cc:   Tammy R. Collins [place], M.D. Fax: [phone]

## 2013-01-31 ENCOUNTER — Ambulatory Visit
Admission: RE | Admit: 2013-01-31 | Discharge: 2013-01-31 | Disposition: A | Discharge status: Home / Self Care | Payer: Managed Care, Other (non HMO) | Source: Ambulatory Visit | Attending: Family Medicine | Admitting: Family Medicine

## 2013-01-31 DIAGNOSIS — R928 Other abnormal and inconclusive findings on diagnostic imaging of breast: Secondary | ICD-10-CM

## 2013-08-12 ENCOUNTER — Other Ambulatory Visit: Payer: Self-pay | Admitting: Family Medicine

## 2013-08-12 DIAGNOSIS — R921 Mammographic calcification found on diagnostic imaging of breast: Secondary | ICD-10-CM

## 2013-08-26 ENCOUNTER — Ambulatory Visit
Admission: RE | Admit: 2013-08-26 | Discharge: 2013-08-26 | Disposition: A | Discharge status: Home / Self Care | Payer: Private Health Insurance - Indemnity | Source: Ambulatory Visit | Attending: Family Medicine | Admitting: Family Medicine

## 2013-08-26 DIAGNOSIS — R921 Mammographic calcification found on diagnostic imaging of breast: Secondary | ICD-10-CM

## 2014-11-20 DIAGNOSIS — C801 Malignant (primary) neoplasm, unspecified: Secondary | ICD-10-CM

## 2014-11-20 HISTORY — DX: Malignant (primary) neoplasm, unspecified: C80.1

## 2015-02-20 ENCOUNTER — Emergency Department (HOSPITAL_COMMUNITY): Payer: BLUE CROSS/BLUE SHIELD

## 2015-02-20 ENCOUNTER — Emergency Department (HOSPITAL_COMMUNITY)
Admission: EM | Admit: 2015-02-20 | Discharge: 2015-02-20 | Disposition: A | Discharge status: Home / Self Care | Payer: BLUE CROSS/BLUE SHIELD | Attending: Emergency Medicine | Admitting: Emergency Medicine

## 2015-02-20 ENCOUNTER — Encounter (HOSPITAL_COMMUNITY): Payer: Self-pay | Admitting: Emergency Medicine

## 2015-02-20 DIAGNOSIS — E669 Obesity, unspecified: Secondary | ICD-10-CM | POA: Diagnosis not present

## 2015-02-20 DIAGNOSIS — R935 Abnormal findings on diagnostic imaging of other abdominal regions, including retroperitoneum: Secondary | ICD-10-CM

## 2015-02-20 DIAGNOSIS — K5732 Diverticulitis of large intestine without perforation or abscess without bleeding: Secondary | ICD-10-CM

## 2015-02-20 DIAGNOSIS — R933 Abnormal findings on diagnostic imaging of other parts of digestive tract: Secondary | ICD-10-CM | POA: Insufficient documentation

## 2015-02-20 DIAGNOSIS — G4730 Sleep apnea, unspecified: Secondary | ICD-10-CM | POA: Diagnosis not present

## 2015-02-20 DIAGNOSIS — E119 Type 2 diabetes mellitus without complications: Secondary | ICD-10-CM | POA: Insufficient documentation

## 2015-02-20 DIAGNOSIS — Z9981 Dependence on supplemental oxygen: Secondary | ICD-10-CM | POA: Insufficient documentation

## 2015-02-20 DIAGNOSIS — E079 Disorder of thyroid, unspecified: Secondary | ICD-10-CM | POA: Insufficient documentation

## 2015-02-20 DIAGNOSIS — Z79899 Other long term (current) drug therapy: Secondary | ICD-10-CM | POA: Insufficient documentation

## 2015-02-20 DIAGNOSIS — R1032 Left lower quadrant pain: Secondary | ICD-10-CM | POA: Diagnosis present

## 2015-02-20 LAB — URINALYSIS, ROUTINE W REFLEX MICROSCOPIC
Bilirubin Urine: NEGATIVE
GLUCOSE, UA: NEGATIVE mg/dL
Hgb urine dipstick: NEGATIVE
KETONES UR: NEGATIVE mg/dL
Leukocytes, UA: NEGATIVE
NITRITE: NEGATIVE
Protein, ur: NEGATIVE mg/dL
SPECIFIC GRAVITY, URINE: 1.01 (ref 1.005–1.030)
Urobilinogen, UA: 0.2 mg/dL (ref 0.0–1.0)
pH: 5 (ref 5.0–8.0)

## 2015-02-20 LAB — CBC WITH DIFFERENTIAL/PLATELET
Basophils Absolute: 0 10*3/uL (ref 0.0–0.1)
Basophils Relative: 0 % (ref 0–1)
EOS PCT: 1 % (ref 0–5)
Eosinophils Absolute: 0.1 10*3/uL (ref 0.0–0.7)
HEMATOCRIT: 39.9 % (ref 36.0–46.0)
Hemoglobin: 13.6 g/dL (ref 12.0–15.0)
Lymphocytes Relative: 22 % (ref 12–46)
Lymphs Abs: 1.9 10*3/uL (ref 0.7–4.0)
MCH: 30.8 pg (ref 26.0–34.0)
MCHC: 34.1 g/dL (ref 30.0–36.0)
MCV: 90.5 fL (ref 78.0–100.0)
MONOS PCT: 7 % (ref 3–12)
Monocytes Absolute: 0.6 10*3/uL (ref 0.1–1.0)
Neutro Abs: 6 10*3/uL (ref 1.7–7.7)
Neutrophils Relative %: 70 % (ref 43–77)
PLATELETS: 193 10*3/uL (ref 150–400)
RBC: 4.41 MIL/uL (ref 3.87–5.11)
RDW: 13 % (ref 11.5–15.5)
WBC: 8.6 10*3/uL (ref 4.0–10.5)

## 2015-02-20 LAB — I-STAT CHEM 8, ED
BUN: 11 mg/dL (ref 6–23)
Calcium, Ion: 1.13 mmol/L (ref 1.12–1.23)
Chloride: 102 mmol/L (ref 96–112)
Creatinine, Ser: 0.6 mg/dL (ref 0.50–1.10)
Glucose, Bld: 137 mg/dL — ABNORMAL HIGH (ref 70–99)
HCT: 39 % (ref 36.0–46.0)
Hemoglobin: 13.3 g/dL (ref 12.0–15.0)
Potassium: 3.6 mmol/L (ref 3.5–5.1)
SODIUM: 139 mmol/L (ref 135–145)
TCO2: 21 mmol/L (ref 0–100)

## 2015-02-20 LAB — HEPATIC FUNCTION PANEL
ALT: 36 U/L — ABNORMAL HIGH (ref 0–35)
AST: 30 U/L (ref 0–37)
Albumin: 4 g/dL (ref 3.5–5.2)
Alkaline Phosphatase: 102 U/L (ref 39–117)
BILIRUBIN DIRECT: 0.2 mg/dL (ref 0.0–0.5)
Indirect Bilirubin: 1 mg/dL — ABNORMAL HIGH (ref 0.3–0.9)
Total Bilirubin: 1.2 mg/dL (ref 0.3–1.2)
Total Protein: 7.3 g/dL (ref 6.0–8.3)

## 2015-02-20 LAB — POC OCCULT BLOOD, ED: FECAL OCCULT BLD: NEGATIVE

## 2015-02-20 LAB — LIPASE, BLOOD: Lipase: 21 U/L (ref 11–59)

## 2015-02-20 MED ORDER — HYDROCODONE-ACETAMINOPHEN 5-325 MG PO TABS: 1.0000 | ORAL_TABLET | Freq: Four times a day (QID) | ORAL | Status: DC | PRN

## 2015-02-20 MED ORDER — CIPROFLOXACIN HCL 500 MG PO TABS: 500.0000 mg | ORAL_TABLET | Freq: Two times a day (BID) | ORAL | Status: DC

## 2015-02-20 MED ORDER — METRONIDAZOLE 500 MG PO TABS
500.0000 mg | ORAL_TABLET | Freq: Once | ORAL | Status: AC
  Administered 2015-02-20: 500 mg via ORAL
  Filled 2015-02-20: qty 1

## 2015-02-20 MED ORDER — CIPROFLOXACIN HCL 500 MG PO TABS
500.0000 mg | ORAL_TABLET | Freq: Once | ORAL | Status: AC
  Administered 2015-02-20: 500 mg via ORAL
  Filled 2015-02-20: qty 1

## 2015-02-20 MED ORDER — IOHEXOL 300 MG/ML  SOLN
100.0000 mL | Freq: Once | INTRAMUSCULAR | Status: AC | PRN
  Administered 2015-02-20: 100 mL via INTRAVENOUS

## 2015-02-20 MED ORDER — METRONIDAZOLE 500 MG PO TABS: 500.0000 mg | ORAL_TABLET | Freq: Two times a day (BID) | ORAL | Status: DC

## 2015-02-20 MED ORDER — SODIUM CHLORIDE 0.9 % IV BOLUS (SEPSIS)
1000.0000 mL | Freq: Once | INTRAVENOUS | Status: AC
  Administered 2015-02-20: 1000 mL via INTRAVENOUS

## 2015-02-20 MED ORDER — ONDANSETRON HCL 4 MG/2ML IJ SOLN
4.0000 mg | Freq: Once | INTRAMUSCULAR | Status: AC
  Administered 2015-02-20: 4 mg via INTRAVENOUS
  Filled 2015-02-20: qty 2

## 2015-02-20 MED ORDER — IOHEXOL 300 MG/ML  SOLN
50.0000 mL | Freq: Once | INTRAMUSCULAR | Status: AC | PRN
  Administered 2015-02-20: 50 mL via ORAL

## 2015-02-20 NOTE — ED Notes (Signed)
Pt alert and oriented x4. Respirations even and unlabored, bilateral symmetrical rise and fall of chest. Skin warm and dry. In no acute distress. Denies needs.   

## 2015-02-20 NOTE — ED Notes (Signed)
Pt escorted to discharge window. Pt verbalized understanding discharge instructions. In no acute distress.  

## 2015-02-20 NOTE — ED Notes (Signed)
Pt reports LLQ abd pain that started yesterday. 48 hour hx of diarrhea, but has not had any diarrhea today. Felt a little nauseated yesterday, none today. No dysuria or unusual odor to urine. Has not seen any blood in diarrhea. Had gallbladder removed several years ago, but no other GI problems.

## 2015-02-20 NOTE — Discharge Instructions (Signed)
Please take antibiotics as prescribed for the full duration.  Taking diabetic medication with antibiotic can sometimes affect your blood sugar level therefore monitor carefully.  There is a lesion noted in your left kidney, which is new when compared to prior CT.  Please discuss this with your doctor as you will need an abdominal MRI in the near future for further evaluation.  Return if you have any concerns   Diverticulitis Diverticulitis is inflammation or infection of small pouches in your colon that form when you have a condition called diverticulosis. The pouches in your colon are called diverticula. Your colon, or large intestine, is where water is absorbed and stool is formed. Complications of diverticulitis can include:  Bleeding.  Severe infection.  Severe pain.  Perforation of your colon.  Obstruction of your colon. CAUSES  Diverticulitis is caused by bacteria. Diverticulitis happens when stool becomes trapped in diverticula. This allows bacteria to grow in the diverticula, which can lead to inflammation and infection. RISK FACTORS People with diverticulosis are at risk for diverticulitis. Eating a diet that does not include enough fiber from fruits and vegetables may make diverticulitis more likely to develop. SYMPTOMS  Symptoms of diverticulitis may include:  Abdominal pain and tenderness. The pain is normally located on the left side of the abdomen, but may occur in other areas.  Fever and chills.  Bloating.  Cramping.  Nausea.  Vomiting.  Constipation.  Diarrhea.  Blood in your stool. DIAGNOSIS  Your health care provider will ask you about your medical history and do a physical exam. You may need to have tests done because many medical conditions can cause the same symptoms as diverticulitis. Tests may include:  Blood tests.  Urine tests.  Imaging tests of the abdomen, including X-rays and CT scans. When your condition is under control, your health care  provider may recommend that you have a colonoscopy. A colonoscopy can show how severe your diverticula are and whether something else is causing your symptoms. TREATMENT  Most cases of diverticulitis are mild and can be treated at home. Treatment may include:  Taking over-the-counter pain medicines.  Following a clear liquid diet.  Taking antibiotic medicines by mouth for 7-10 days. More severe cases may be treated at a hospital. Treatment may include:  Not eating or drinking.  Taking prescription pain medicine.  Receiving antibiotic medicines through an IV tube.  Receiving fluids and nutrition through an IV tube.  Surgery. HOME CARE INSTRUCTIONS   Follow your health care provider's instructions carefully.  Follow a full liquid diet or other diet as directed by your health care provider. After your symptoms improve, your health care provider may tell you to change your diet. He or she may recommend you eat a high-fiber diet. Fruits and vegetables are good sources of fiber. Fiber makes it easier to pass stool.  Take fiber supplements or probiotics as directed by your health care provider.  Only take medicines as directed by your health care provider.  Keep all your follow-up appointments. SEEK MEDICAL CARE IF:   Your pain does not improve.  You have a hard time eating food.  Your bowel movements do not return to normal. SEEK IMMEDIATE MEDICAL CARE IF:   Your pain becomes worse.  Your symptoms do not get better.  Your symptoms suddenly get worse.  You have a fever.  You have repeated vomiting.  You have bloody or black, tarry stools. MAKE SURE YOU:   Understand these instructions.  Will watch your condition.  Will get help right away if you are not doing well or get worse. Document Released: 08/16/2005 Document Revised: 11/11/2013 Document Reviewed: 10/01/2013 [facility] Patient Information 2015 [place], [place]. This information is not intended to replace advice  given to you by your health care provider. Make sure you discuss any questions you have with your health care provider.  Incidental Abdominal Radiologic Finding An incidental abdominal radiologic finding happens during an imaging exam of your abdominal region. The finding is an abnormality, and it is incidental because it is unrelated to the reason your caregiver prescribed the exam. The abnormality is usually a very small mass or scar tissue.  With newer imaging tests and technologies, it is becoming more common to find small masses and tissue when looking for other things. These findings can sometimes be related to undiagnosed illness. Although many are not a cause for concern, it is always a good idea to talk to your caregiver about what was found.  TYPES OF FINDINGS  Incidental abdominal radiologic findings are often located in the kidneys or liver, but they can also be found in the gallbladder, adrenal glands, intestines, and other surrounding organs and tissues. There are many types of masses and tissue abnormalities that can be detected during an imaging test:  Lesions. Changes in tissue due to infection, tissue death, or trauma.  Cysts. Sacs filled with fluid or some other substance.  Tumors. Noncancerous or cancerous solid formation. ADDITIONAL TESTING AND DIAGNOSIS Additional tests may be needed when the finding is first noted. After these tests, your caregiver may request periodic follow-up tests to determine if any changes are occurring. Tests may include:  A physical exam.  Blood tests.  Urine tests.  Imaging tests, such as abdominal ultrasonography, computerized X-ray scan (computed tomography, CT), and computerized magnetic scan (MRI).  Biopsy. TREATMENT  Treatment, if any, varies and depends on:  The cause.  Location.  Size.  Appearance of the finding. Treatment will also depend on your age and underlying conditions or symptoms. Treatment may include:  Watchful  waiting, with periodic examination and testing.  Treatments to reduce the size of the mass or abnormality.  Surgical removal of the mass or abnormality. HOME CARE INSTRUCTIONS  See your caregiver for follow-up exams as directed. Your caregiver will let you know if this is routine follow-up or if there is a reason for concern. Early detection can be very beneficial to you. Follow up with your caregiver to find out the results of your tests. Not all test results may be available during your visit. If your test results are not back during the visit, make an appointment with your caregiver to find out the results. Do not assume everything is normal if you have not heard from your caregiver or the medical facility. It is important for you to follow up on all of your test results. MAKE SURE YOU:   Understand these instructions.  Will watch your condition.  Will get help right away if you are not doing well or get worse. Document Released: 04/26/2010 Document Revised: 03/23/2014 Document Reviewed: 04/26/2010 [facility] Patient Information 2015 [place], [place]. This information is not intended to replace advice given to you by your health care provider. Make sure you discuss any questions you have with your health care provider.

## 2015-02-20 NOTE — ED Provider Notes (Signed)
CSN: [id number]     Arrival date & time 02/20/15  1448 History   First MD Initiated Contact with Patient 02/20/15 [phone]     Chief Complaint  Patient presents with  . Abdominal Pain     (Consider location/radiation/quality/duration/timing/severity/associated sxs/prior Treatment) HPI   59 year old obese female with history of non-insulin-dependent diabetes, thyroid disease presents c/o abd pain. Patient reports for the past 48 hours patient has had persistent left lower quadrant abdominal pain with associated nausea and diarrhea. Yesterday she has 6 bouts of nonbloody non-mucousy diarrhea but none today. She felt nauseous without vomiting. She describes sharp pain to her left lower quadrant, intense, worsening with palpation or with movement. Pain initially 8 out of 10 and now is it is 5 out of 10. There is no associated fever, chills, chest pain, shortness of breath, productive cough, back pain, dysuria, hematuria, hematochezia or melena. Prior abdominal surgery for cholecystectomy. No specific treatment tried. No recent antibiotic use. No other specific complaint.  Past Medical History  Diagnosis Date  . Allergy   . Thyroid disease   . Obesity   . Sleep apnea     CPAP  . Diabetes mellitus     oral agent control  . Arthritis     hands,knees,elbows  . Hypothyroidism    Past Surgical History  Procedure Laterality Date  . Colonoscopy    . Polypectomy    . Rotator cuff repair Left 2011  . Cholecystectomy  2009  . Breast biopsy Right 2001  . Dilatation & currettage/hysteroscopy with resectocope N/A 01/21/2013    Procedure: DILATATION & CURETTAGE/HYSTEROSCOPY WITH RESECTOCOPE AND RESECTION OF ENDOMETRIAL;  Surgeon: Selinda Orion, MD;  Location: [facility];  Service: Gynecology;  Laterality: N/A;   No family history on file. History  Substance Use Topics  . Smoking status: Never Smoker   . Smokeless tobacco: Never Used  . Alcohol Use: No   OB History    No data  available     Review of Systems  All other systems reviewed and are negative.     Allergies  Review of patient's allergies indicates no known allergies.  Home Medications   Prior to Admission medications   Medication Sig Start Date End Date Taking? Authorizing Provider  Calcium Carbonate-Vitamin D (CALCIUM-VITAMIN D) 500-200 MG-UNIT per tablet Take 1 tablet by mouth daily.      Historical Provider, MD  furosemide (LASIX) 20 MG tablet Take 20 mg by mouth 2 (two) times daily.      Historical Provider, MD  ibuprofen (ADVIL,MOTRIN) 200 MG tablet Take 200 mg by mouth as needed.      Historical Provider, MD  levothyroxine (SYNTHROID, LEVOTHROID) 25 MCG tablet Take 25 mcg by mouth daily.      Historical Provider, MD  metFORMIN (GLUCOPHAGE) 1000 MG tablet Take 1,000 mg by mouth 2 (two) times daily with a meal.    Historical Provider, MD   There were no vitals taken for this visit. Physical Exam  Constitutional: She appears well-developed and well-nourished. No distress.  Moderately obese Caucasian female appears to be in no acute distress, nontoxic appearance.  HENT:  Head: Atraumatic.  Eyes: Conjunctivae are normal.  Neck: Neck supple.  Cardiovascular: Normal rate and regular rhythm.   Pulmonary/Chest: Effort normal and breath sounds normal.  Abdominal: Soft. Bowel sounds are normal. She exhibits no distension. There is tenderness (tenderness to left lower quadrant on palpation without guarding or rebound tenderness).  Genitourinary:  No CVA tenderness  Neurological:  She is alert.  Skin: No rash noted.  Psychiatric: She has a normal mood and affect.  Nursing note and vitals reviewed.   ED Course  Procedures (including critical care time)  Patient presents with symptoms suggestive of diverticulitis or colitis. Workup initiated. Pain medication offered, patient declined.  1:11 PM Patient's labs were reassuring. Abdominal and pelvis CT scan shows evidence of diverticulitis at the  descending and sigmoid colon without evidence of abscess or perforation. There is also an enhancing mass lesion in the left kidney that was not present in 2009. Recommend MRI for further evaluation as this could be malignancy. I discussed this finding with patient. At this time patient is stable for discharge. She is able to tolerates by mouth. Her blood pressures is soft however she prefers to be discharged. Antibiotic prescribed, return precaution given. Care discussed with Dr. Maryan Rued.  Labs Review Labs Reviewed  HEPATIC FUNCTION PANEL - Abnormal; Notable for the following:    ALT 36 (*)    Indirect Bilirubin 1.0 (*)    All other components within normal limits  I-STAT CHEM 8, ED - Abnormal; Notable for the following:    Glucose, Bld 137 (*)    All other components within normal limits  CBC WITH DIFFERENTIAL/PLATELET  URINALYSIS, ROUTINE W REFLEX MICROSCOPIC  LIPASE, BLOOD  POC OCCULT BLOOD, ED    Imaging Review Ct Abdomen Pelvis W Contrast  02/20/2015   CLINICAL DATA:  Left lower quadrant pain for 1 day  EXAM: CT ABDOMEN AND PELVIS WITH CONTRAST  TECHNIQUE: Multidetector CT imaging of the abdomen and pelvis was performed using the standard protocol following bolus administration of intravenous contrast.  CONTRAST:  79mL OMNIPAQUE IOHEXOL 300 MG/ML SOLN, 14mL OMNIPAQUE IOHEXOL 300 MG/ML SOLN  COMPARISON:  01/03/2008.  FINDINGS: Lung bases are free of acute infiltrate or sizable effusion.  The liver is diffusely fatty infiltrated. The gallbladder has been surgically removed. The spleen, adrenal glands and pancreas are all normal in their CT appearance. Right kidney show some mild fullness of the collecting system although no true obstructive changes are seen. The left kidney demonstrates similar fullness of the collecting system without definitive obstructive change. No calculi are seen. A 3.7 x 2.9 cm enhancing mass lesion is noted arising from midportion of the left kidney however. This must  be viewed with suspicion for neoplasm.  Changes of diverticulitis are noted at junction of the descending and sigmoid colons. No abscess or perforation is identified.  The bladder is well distended. No pelvic fluid is seen. Degenerative changes of the lumbar spine are seen. No acute bony abnormality noted.  IMPRESSION: Changes consistent with diverticulitis as described.  Enhancing mass lesion in the left kidney. This was not present on a prior exam of 2009 and must be viewed as a neoplastic lesion until proven otherwise. Contrast enhanced MRI may be helpful for further evaluation when the patient's current condition improves.   Electronically Signed   By: Inez Catalina M.D.   On: 02/20/2015 11:31     EKG Interpretation None      MDM   Final diagnoses:  Diverticulitis of large intestine without perforation or abscess without bleeding  Abnormal abdominal CT scan    BP 100/46 mmHg  Pulse 85  Temp(Src) 97.8 F (36.6 C) (Oral)  Resp 18  SpO2 99%  I have reviewed nursing notes and vital signs. I personally reviewed the imaging tests through PACS system  I reviewed available ER/hospitalization records thought the EMR  Domenic Moras, PA-C 02/20/15 1313  Blanchie Dessert, MD 02/20/15 [phone]

## 2015-02-20 NOTE — ED Notes (Signed)
Pt back from CT

## 2015-02-20 NOTE — ED Notes (Signed)
PA at bedside.

## 2015-05-05 ENCOUNTER — Other Ambulatory Visit: Payer: Self-pay | Admitting: Urology

## 2015-05-06 ENCOUNTER — Other Ambulatory Visit: Payer: Self-pay | Admitting: Urology

## 2015-05-31 NOTE — Patient Instructions (Signed)
Carol [name]  05/31/2015   Your procedure is scheduled on:    06/11/2015    Report to [facility] Main  Entrance take [place]  elevators to 3rd floor to  [place] at     [place] AM.  Call this number if you have problems the morning of surgery [phone]   Remember: ONLY 1 PERSON MAY GO WITH YOU TO SHORT STAY TO GET  READY MORNING OF [place].  Do not eat food or drink liquids :After Midnight.     Take these medicines the morning of surgery with A SIP OF WATER:   Synthroid, Proair inhaler if needed and bring                               You may not have any metal on your body including hair pins and              piercings  Do not wear jewelry, make-up, lotions, powders or perfumes, deodorant             Do not wear nail polish.  Do not shave  48 hours prior to surgery.                Do not bring valuables to the hospital. [place].  Contacts, dentures or bridgework may not be worn into surgery.  Leave suitcase in the car. After surgery it may be brought to your room.      Special Instructions: coughing and deep breathing exercises, leg exercises               Please read over the following fact sheets you were given: _____________________________________________________________________             [facility] - Preparing for Surgery Before surgery, you can play an important role.  Because skin is not sterile, your skin needs to be as free of germs as possible.  You can reduce the number of germs on your skin by washing with CHG (chlorahexidine gluconate) soap before surgery.  CHG is an antiseptic cleaner which kills germs and bonds with the skin to continue killing germs even after washing. Please DO NOT use if you have an allergy to CHG or antibacterial soaps.  If your skin becomes reddened/irritated stop using the CHG and inform your nurse when you arrive at Short Stay. Do not shave (including  legs and underarms) for at least 48 hours prior to the first CHG shower.  You may shave your face/neck. Please follow these instructions carefully:  1.  Shower with CHG Soap the night before surgery and the  morning of Surgery.  2.  If you choose to wash your hair, wash your hair first as usual with your  normal  shampoo.  3.  After you shampoo, rinse your hair and body thoroughly to remove the  shampoo.                           4.  Use CHG as you would any other liquid soap.  You can apply chg directly  to the skin and wash  Gently with a scrungie or clean washcloth.  5.  Apply the CHG Soap to your body ONLY FROM THE NECK DOWN.   Do not use on face/ open                           Wound or open sores. Avoid contact with eyes, ears mouth and genitals (private parts).                       Wash face,  Genitals (private parts) with your normal soap.             6.  Wash thoroughly, paying special attention to the area where your surgery  will be performed.  7.  Thoroughly rinse your body with warm water from the neck down.  8.  DO NOT shower/wash with your normal soap after using and rinsing off  the CHG Soap.                9.  Pat yourself dry with a clean towel.            10.  Wear clean pajamas.            11.  Place clean sheets on your bed the night of your first shower and do not  sleep with pets. Day of Surgery : Do not apply any lotions/deodorants the morning of surgery.  Please wear clean clothes to the hospital/surgery center.  FAILURE TO FOLLOW THESE INSTRUCTIONS MAY RESULT IN THE CANCELLATION OF YOUR SURGERY PATIENT SIGNATURE_________________________________  NURSE SIGNATURE__________________________________  ________________________________________________________________________  WHAT IS A BLOOD TRANSFUSION? Blood Transfusion Information  A transfusion is the replacement of blood or some of its parts. Blood is made up of multiple cells which provide  different functions.  Red blood cells carry oxygen and are used for blood loss replacement.  White blood cells fight against infection.  Platelets control bleeding.  Plasma helps clot blood.  Other blood products are available for specialized needs, such as hemophilia or other clotting disorders. BEFORE THE TRANSFUSION  Who gives blood for transfusions?   Healthy volunteers who are fully evaluated to make sure their blood is safe. This is blood bank blood. Transfusion therapy is the safest it has ever been in the practice of medicine. Before blood is taken from a donor, a complete history is taken to make sure that person has no history of diseases nor engages in risky social behavior (examples are intravenous drug use or sexual activity with multiple partners). The donor's travel history is screened to minimize risk of transmitting infections, such as malaria. The donated blood is tested for signs of infectious diseases, such as HIV and hepatitis. The blood is then tested to be sure it is compatible with you in order to minimize the chance of a transfusion reaction. If you or a relative donates blood, this is often done in anticipation of surgery and is not appropriate for emergency situations. It takes many days to process the donated blood. RISKS AND COMPLICATIONS Although transfusion therapy is very safe and saves many lives, the main dangers of transfusion include:   Getting an infectious disease.  Developing a transfusion reaction. This is an allergic reaction to something in the blood you were given. Every precaution is taken to prevent this. The decision to have a blood transfusion has been considered carefully by your caregiver before blood is given. Blood is not given unless the benefits outweigh  the risks. AFTER THE TRANSFUSION  Right after receiving a blood transfusion, you will usually feel much better and more energetic. This is especially true if your red blood cells have  gotten low (anemic). The transfusion raises the level of the red blood cells which carry oxygen, and this usually causes an energy increase.  The nurse administering the transfusion will monitor you carefully for complications. HOME CARE INSTRUCTIONS  No special instructions are needed after a transfusion. You may find your energy is better. Speak with your caregiver about any limitations on activity for underlying diseases you may have. SEEK MEDICAL CARE IF:   Your condition is not improving after your transfusion.  You develop redness or irritation at the intravenous (IV) site. SEEK IMMEDIATE MEDICAL CARE IF:  Any of the following symptoms occur over the next 12 hours:  Shaking chills.  You have a temperature by mouth above 102 F (38.9 C), not controlled by medicine.  Chest, back, or muscle pain.  People around you feel you are not acting correctly or are confused.  Shortness of breath or difficulty breathing.  Dizziness and fainting.  You get a rash or develop hives.  You have a decrease in urine output.  Your urine turns a dark color or changes to pink, red, or brown. Any of the following symptoms occur over the next 10 days:  You have a temperature by mouth above 102 F (38.9 C), not controlled by medicine.  Shortness of breath.  Weakness after normal activity.  The white part of the eye turns yellow (jaundice).  You have a decrease in the amount of urine or are urinating less often.  Your urine turns a dark color or changes to pink, red, or brown. Document Released: 11/03/2000 Document Revised: 01/29/2012 Document Reviewed: 06/22/2008 [facility] Patient Information 2014 [place], [place].  _______________________________________________________________________

## 2015-06-01 ENCOUNTER — Other Ambulatory Visit: Payer: Self-pay

## 2015-06-01 ENCOUNTER — Encounter (HOSPITAL_COMMUNITY)
Admission: RE | Admit: 2015-06-01 | Discharge: 2015-06-01 | Disposition: A | Discharge status: Home / Self Care | Payer: BLUE CROSS/BLUE SHIELD | Source: Ambulatory Visit | Attending: Urology | Admitting: Urology

## 2015-06-01 ENCOUNTER — Ambulatory Visit (HOSPITAL_COMMUNITY)
Admission: RE | Admit: 2015-06-01 | Discharge: 2015-06-01 | Disposition: A | Discharge status: Home / Self Care | Payer: BLUE CROSS/BLUE SHIELD | Source: Ambulatory Visit | Attending: Urology | Admitting: Urology

## 2015-06-01 ENCOUNTER — Encounter (HOSPITAL_COMMUNITY): Payer: Self-pay

## 2015-06-01 DIAGNOSIS — Z01818 Encounter for other preprocedural examination: Secondary | ICD-10-CM | POA: Diagnosis not present

## 2015-06-01 DIAGNOSIS — E119 Type 2 diabetes mellitus without complications: Secondary | ICD-10-CM | POA: Insufficient documentation

## 2015-06-01 DIAGNOSIS — Z01810 Encounter for preprocedural cardiovascular examination: Secondary | ICD-10-CM | POA: Diagnosis not present

## 2015-06-01 HISTORY — DX: Effusion, unspecified ankle: M25.473

## 2015-06-01 LAB — COMPREHENSIVE METABOLIC PANEL
ALBUMIN: 4.4 g/dL (ref 3.5–5.0)
ALT: 35 U/L (ref 14–54)
ANION GAP: 10 (ref 5–15)
AST: 27 U/L (ref 15–41)
Alkaline Phosphatase: 86 U/L (ref 38–126)
BUN: 16 mg/dL (ref 6–20)
CO2: 26 mmol/L (ref 22–32)
Calcium: 9.7 mg/dL (ref 8.9–10.3)
Chloride: 102 mmol/L (ref 101–111)
Creatinine, Ser: 0.78 mg/dL (ref 0.44–1.00)
GFR calc Af Amer: >60 mL/min (ref >60)
GFR calc non Af Amer: >60 mL/min (ref >60)
GLUCOSE: 225 mg/dL — AB (ref 65–99)
Potassium: 4.1 mmol/L (ref 3.5–5.1)
SODIUM: 138 mmol/L (ref 135–145)
Total Bilirubin: 0.8 mg/dL (ref 0.3–1.2)
Total Protein: 7.5 g/dL (ref 6.5–8.1)

## 2015-06-01 LAB — CBC
HCT: 39.5 % (ref 36.0–46.0)
Hemoglobin: 14.1 g/dL (ref 12.0–15.0)
MCH: 31.9 pg (ref 26.0–34.0)
MCHC: 35.7 g/dL (ref 30.0–36.0)
MCV: 89.4 fL (ref 78.0–100.0)
PLATELETS: 214 10*3/uL (ref 150–400)
RBC: 4.42 MIL/uL (ref 3.87–5.11)
RDW: 13.4 % (ref 11.5–15.5)
WBC: 6.8 10*3/uL (ref 4.0–10.5)

## 2015-06-01 NOTE — Progress Notes (Signed)
Final EKG in EPIC done 06/01/2015.

## 2015-06-02 LAB — URINE CULTURE

## 2015-06-02 NOTE — Progress Notes (Signed)
Urine culture done 06/01/2015 faxed via EPIC to Dr Louis Meckel.

## 2015-06-02 NOTE — Progress Notes (Signed)
05/07/2015 [name],NP with LOV on chart.

## 2015-06-09 NOTE — Progress Notes (Signed)
Called patient to clarify which part of cpap machine was broken and how long had it been broken. Patient stated that she has not used in greater than a year and that the mask is the part that is broken.  Patient reports she had knee surgery earlier this year and did not have to have a CPAP.  Patient plans to get fixed in the upcoming future.

## 2015-06-11 ENCOUNTER — Inpatient Hospital Stay (HOSPITAL_COMMUNITY): Payer: BLUE CROSS/BLUE SHIELD | Admitting: Anesthesiology

## 2015-06-11 ENCOUNTER — Inpatient Hospital Stay (HOSPITAL_COMMUNITY)
Admission: RE | Admit: 2015-06-11 | Discharge: 2015-06-15 | DRG: 660 | Disposition: A | Discharge status: Home / Self Care | Payer: BLUE CROSS/BLUE SHIELD | Source: Ambulatory Visit | Attending: Urology | Admitting: Urology

## 2015-06-11 ENCOUNTER — Encounter (HOSPITAL_COMMUNITY)
Admission: RE | Disposition: A | Discharge status: Home / Self Care | Payer: Self-pay | Source: Ambulatory Visit | Attending: Urology

## 2015-06-11 ENCOUNTER — Encounter (HOSPITAL_COMMUNITY): Payer: Self-pay | Admitting: Urology

## 2015-06-11 DIAGNOSIS — G4733 Obstructive sleep apnea (adult) (pediatric): Secondary | ICD-10-CM | POA: Diagnosis present

## 2015-06-11 DIAGNOSIS — K219 Gastro-esophageal reflux disease without esophagitis: Secondary | ICD-10-CM | POA: Diagnosis present

## 2015-06-11 DIAGNOSIS — N280 Ischemia and infarction of kidney: Secondary | ICD-10-CM | POA: Diagnosis present

## 2015-06-11 DIAGNOSIS — E039 Hypothyroidism, unspecified: Secondary | ICD-10-CM | POA: Diagnosis present

## 2015-06-11 DIAGNOSIS — D49519 Neoplasm of unspecified behavior of unspecified kidney: Secondary | ICD-10-CM | POA: Diagnosis present

## 2015-06-11 DIAGNOSIS — Z6841 Body Mass Index (BMI) 40.0 and over, adult: Secondary | ICD-10-CM | POA: Diagnosis not present

## 2015-06-11 DIAGNOSIS — Z9049 Acquired absence of other specified parts of digestive tract: Secondary | ICD-10-CM | POA: Diagnosis present

## 2015-06-11 DIAGNOSIS — D62 Acute posthemorrhagic anemia: Secondary | ICD-10-CM | POA: Diagnosis not present

## 2015-06-11 DIAGNOSIS — N2889 Other specified disorders of kidney and ureter: Principal | ICD-10-CM | POA: Diagnosis present

## 2015-06-11 DIAGNOSIS — E119 Type 2 diabetes mellitus without complications: Secondary | ICD-10-CM

## 2015-06-11 DIAGNOSIS — E780 Pure hypercholesterolemia: Secondary | ICD-10-CM | POA: Diagnosis present

## 2015-06-11 HISTORY — PX: ROBOTIC ASSITED PARTIAL NEPHRECTOMY: SHX6087

## 2015-06-11 LAB — BASIC METABOLIC PANEL
Anion gap: 9 (ref 5–15)
BUN: 13 mg/dL (ref 6–20)
CHLORIDE: 105 mmol/L (ref 101–111)
CO2: 22 mmol/L (ref 22–32)
CREATININE: 0.91 mg/dL (ref 0.44–1.00)
Calcium: 7.7 mg/dL — ABNORMAL LOW (ref 8.9–10.3)
GFR calc non Af Amer: >60 mL/min (ref >60)
Glucose, Bld: 319 mg/dL — ABNORMAL HIGH (ref 65–99)
Potassium: 4.4 mmol/L (ref 3.5–5.1)
Sodium: 136 mmol/L (ref 135–145)

## 2015-06-11 LAB — ABO/RH: ABO/RH(D): O NEG

## 2015-06-11 LAB — GLUCOSE, CAPILLARY
GLUCOSE-CAPILLARY: 138 mg/dL — AB (ref 65–99)
Glucose-Capillary: 308 mg/dL — ABNORMAL HIGH (ref 65–99)
Glucose-Capillary: 335 mg/dL — ABNORMAL HIGH (ref 65–99)

## 2015-06-11 LAB — TYPE AND SCREEN
ABO/RH(D): O NEG
Antibody Screen: NEGATIVE

## 2015-06-11 LAB — HEMOGLOBIN AND HEMATOCRIT, BLOOD
HCT: 26.6 % — ABNORMAL LOW (ref 36.0–46.0)
Hemoglobin: 9.1 g/dL — ABNORMAL LOW (ref 12.0–15.0)

## 2015-06-11 SURGERY — ROBOTIC ASSITED PARTIAL NEPHRECTOMY
Anesthesia: General | Laterality: Left

## 2015-06-11 MED ORDER — MEPERIDINE HCL 50 MG/ML IJ SOLN: 6.2500 mg | INTRAMUSCULAR | Status: DC | PRN

## 2015-06-11 MED ORDER — LEVOTHYROXINE SODIUM 25 MCG PO TABS
75.0000 ug | ORAL_TABLET | Freq: Every day | ORAL | Status: DC
  Administered 2015-06-12 – 2015-06-14 (×3): 75 ug via ORAL
  Filled 2015-06-11 (×3): qty 3

## 2015-06-11 MED ORDER — CEFAZOLIN SODIUM-DEXTROSE 2-3 GM-% IV SOLR
INTRAVENOUS | Status: AC
  Filled 2015-06-11: qty 50

## 2015-06-11 MED ORDER — ONDANSETRON HCL 4 MG/2ML IJ SOLN
INTRAMUSCULAR | Status: AC
  Administered 2015-06-11: 18:00:00
  Filled 2015-06-11: qty 2

## 2015-06-11 MED ORDER — LIDOCAINE HCL (CARDIAC) 20 MG/ML IV SOLN
INTRAVENOUS | Status: DC | PRN
  Administered 2015-06-11: 50 mg via INTRAVENOUS

## 2015-06-11 MED ORDER — BUPIVACAINE-EPINEPHRINE (PF) 0.25% -1:200000 IJ SOLN
INTRAMUSCULAR | Status: DC | PRN
  Administered 2015-06-11: 10 mL

## 2015-06-11 MED ORDER — DIPHENHYDRAMINE HCL 12.5 MG/5ML PO ELIX: 12.5000 mg | ORAL_SOLUTION | Freq: Four times a day (QID) | ORAL | Status: DC | PRN

## 2015-06-11 MED ORDER — MORPHINE SULFATE 2 MG/ML IJ SOLN
2.0000 mg | INTRAMUSCULAR | Status: DC | PRN
  Filled 2015-06-11: qty 1

## 2015-06-11 MED ORDER — SODIUM CHLORIDE 0.9 % IJ SOLN
INTRAMUSCULAR | Status: AC
  Filled 2015-06-11: qty 10

## 2015-06-11 MED ORDER — PHENYLEPHRINE HCL 10 MG/ML IJ SOLN
INTRAMUSCULAR | Status: DC | PRN
  Administered 2015-06-11 (×2): 80 ug via INTRAVENOUS

## 2015-06-11 MED ORDER — PROPOFOL 10 MG/ML IV BOLUS
INTRAVENOUS | Status: DC | PRN
  Administered 2015-06-11: 200 mg via INTRAVENOUS

## 2015-06-11 MED ORDER — BUPIVACAINE-EPINEPHRINE (PF) 0.25% -1:200000 IJ SOLN
INTRAMUSCULAR | Status: AC
  Filled 2015-06-11: qty 30

## 2015-06-11 MED ORDER — INSULIN ASPART 100 UNIT/ML ~~LOC~~ SOLN
0.0000 [IU] | SUBCUTANEOUS | Status: DC
  Administered 2015-06-11: 11 [IU] via SUBCUTANEOUS
  Administered 2015-06-12 (×3): 3 [IU] via SUBCUTANEOUS
  Administered 2015-06-12: 5 [IU] via SUBCUTANEOUS
  Administered 2015-06-12: 11 [IU] via SUBCUTANEOUS
  Administered 2015-06-12 (×2): 5 [IU] via SUBCUTANEOUS
  Administered 2015-06-13 (×3): 3 [IU] via SUBCUTANEOUS
  Administered 2015-06-13: 2 [IU] via SUBCUTANEOUS
  Administered 2015-06-13 – 2015-06-15 (×9): 3 [IU] via SUBCUTANEOUS

## 2015-06-11 MED ORDER — HYDROMORPHONE HCL 2 MG/ML IJ SOLN
INTRAMUSCULAR | Status: AC
  Filled 2015-06-11: qty 1

## 2015-06-11 MED ORDER — FENTANYL CITRATE (PF) 100 MCG/2ML IJ SOLN: 25.0000 ug | INTRAMUSCULAR | Status: DC | PRN

## 2015-06-11 MED ORDER — LACTATED RINGERS IV SOLN: INTRAVENOUS | Status: DC

## 2015-06-11 MED ORDER — EPHEDRINE SULFATE 50 MG/ML IJ SOLN
INTRAMUSCULAR | Status: DC | PRN
  Administered 2015-06-11: 5 mg via INTRAVENOUS

## 2015-06-11 MED ORDER — ROCURONIUM BROMIDE 100 MG/10ML IV SOLN
INTRAVENOUS | Status: DC | PRN
  Administered 2015-06-11: 20 mg via INTRAVENOUS
  Administered 2015-06-11: 50 mg via INTRAVENOUS
  Administered 2015-06-11 (×2): 20 mg via INTRAVENOUS

## 2015-06-11 MED ORDER — ALBUTEROL SULFATE (2.5 MG/3ML) 0.083% IN NEBU: 3.0000 mL | INHALATION_SOLUTION | RESPIRATORY_TRACT | Status: DC | PRN

## 2015-06-11 MED ORDER — NEOSTIGMINE METHYLSULFATE 10 MG/10ML IV SOLN
INTRAVENOUS | Status: DC | PRN
  Administered 2015-06-11: 5 mg via INTRAVENOUS

## 2015-06-11 MED ORDER — MANNITOL 25 % IV SOLN
25.0000 g | Freq: Once | INTRAVENOUS | Status: AC
  Administered 2015-06-11 (×2): 12.5 g via INTRAVENOUS
  Filled 2015-06-11: qty 100

## 2015-06-11 MED ORDER — DEXAMETHASONE SODIUM PHOSPHATE 10 MG/ML IJ SOLN
INTRAMUSCULAR | Status: DC | PRN
  Administered 2015-06-11: 10 mg via INTRAVENOUS

## 2015-06-11 MED ORDER — LIDOCAINE HCL (CARDIAC) 20 MG/ML IV SOLN
INTRAVENOUS | Status: AC
  Filled 2015-06-11: qty 5

## 2015-06-11 MED ORDER — MIDAZOLAM HCL 2 MG/2ML IJ SOLN
INTRAMUSCULAR | Status: AC
  Filled 2015-06-11: qty 2

## 2015-06-11 MED ORDER — CIPROFLOXACIN IN D5W 400 MG/200ML IV SOLN
400.0000 mg | INTRAVENOUS | Status: AC
  Administered 2015-06-11: 400 mg via INTRAVENOUS

## 2015-06-11 MED ORDER — LACTATED RINGERS IR SOLN
Status: DC | PRN
  Administered 2015-06-11: 3000 mL

## 2015-06-11 MED ORDER — ALBUMIN HUMAN 5 % IV SOLN
INTRAVENOUS | Status: DC | PRN
  Administered 2015-06-11: 16:00:00 via INTRAVENOUS

## 2015-06-11 MED ORDER — LABETALOL HCL 5 MG/ML IV SOLN
INTRAVENOUS | Status: AC
  Filled 2015-06-11: qty 4

## 2015-06-11 MED ORDER — DIPHENHYDRAMINE HCL 50 MG/ML IJ SOLN: 12.5000 mg | Freq: Four times a day (QID) | INTRAMUSCULAR | Status: DC | PRN

## 2015-06-11 MED ORDER — PHENYLEPHRINE HCL 10 MG/ML IJ SOLN
INTRAMUSCULAR | Status: AC
  Filled 2015-06-11: qty 1

## 2015-06-11 MED ORDER — BUPIVACAINE LIPOSOME 1.3 % IJ SUSP
20.0000 mL | Freq: Once | INTRAMUSCULAR | Status: AC
  Administered 2015-06-11: 20 mL
  Filled 2015-06-11: qty 20

## 2015-06-11 MED ORDER — ALBUMIN HUMAN 5 % IV SOLN
INTRAVENOUS | Status: AC
  Filled 2015-06-11: qty 250

## 2015-06-11 MED ORDER — MIDAZOLAM HCL 5 MG/5ML IJ SOLN
INTRAMUSCULAR | Status: DC | PRN
  Administered 2015-06-11: 2 mg via INTRAVENOUS

## 2015-06-11 MED ORDER — FENTANYL CITRATE (PF) 100 MCG/2ML IJ SOLN
INTRAMUSCULAR | Status: DC | PRN
  Administered 2015-06-11: 50 ug via INTRAVENOUS
  Administered 2015-06-11: 100 ug via INTRAVENOUS
  Administered 2015-06-11 (×2): 50 ug via INTRAVENOUS

## 2015-06-11 MED ORDER — PROMETHAZINE HCL 25 MG/ML IJ SOLN: 6.2500 mg | INTRAMUSCULAR | Status: DC | PRN

## 2015-06-11 MED ORDER — CIPROFLOXACIN IN D5W 400 MG/200ML IV SOLN
INTRAVENOUS | Status: AC
  Filled 2015-06-11: qty 200

## 2015-06-11 MED ORDER — ROCURONIUM BROMIDE 100 MG/10ML IV SOLN
INTRAVENOUS | Status: AC
  Filled 2015-06-11: qty 1

## 2015-06-11 MED ORDER — DOCUSATE SODIUM 100 MG PO CAPS
100.0000 mg | ORAL_CAPSULE | Freq: Two times a day (BID) | ORAL | Status: DC
  Administered 2015-06-11 – 2015-06-14 (×7): 100 mg via ORAL
  Filled 2015-06-11 (×7): qty 1

## 2015-06-11 MED ORDER — CEFAZOLIN SODIUM-DEXTROSE 2-3 GM-% IV SOLR
2.0000 g | Freq: Once | INTRAVENOUS | Status: AC
  Administered 2015-06-11: 2 g via INTRAVENOUS

## 2015-06-11 MED ORDER — STERILE WATER FOR IRRIGATION IR SOLN
Status: DC | PRN
  Administered 2015-06-11: 1500 mL

## 2015-06-11 MED ORDER — DEXTROSE-NACL 5-0.45 % IV SOLN
INTRAVENOUS | Status: DC
  Administered 2015-06-11: 18:00:00 via INTRAVENOUS

## 2015-06-11 MED ORDER — INSULIN ASPART 100 UNIT/ML ~~LOC~~ SOLN
SUBCUTANEOUS | Status: AC
  Filled 2015-06-11: qty 1

## 2015-06-11 MED ORDER — ONDANSETRON HCL 4 MG/2ML IJ SOLN
INTRAMUSCULAR | Status: AC
  Filled 2015-06-11: qty 2

## 2015-06-11 MED ORDER — INSULIN ASPART 100 UNIT/ML ~~LOC~~ SOLN
8.0000 [IU] | Freq: Once | SUBCUTANEOUS | Status: AC
  Administered 2015-06-11: 8 [IU] via SUBCUTANEOUS

## 2015-06-11 MED ORDER — SODIUM CHLORIDE 0.9 % IJ SOLN
INTRAMUSCULAR | Status: DC | PRN
  Administered 2015-06-11: 20 mL

## 2015-06-11 MED ORDER — LABETALOL HCL 5 MG/ML IV SOLN
INTRAVENOUS | Status: DC | PRN
  Administered 2015-06-11 (×2): 5 mg via INTRAVENOUS

## 2015-06-11 MED ORDER — LACTATED RINGERS IV SOLN
INTRAVENOUS | Status: DC
  Administered 2015-06-11: 13:00:00 via INTRAVENOUS
  Administered 2015-06-11: 1000 mL via INTRAVENOUS
  Administered 2015-06-11: 15:00:00 via INTRAVENOUS

## 2015-06-11 MED ORDER — ONDANSETRON HCL 4 MG/2ML IJ SOLN
4.0000 mg | INTRAMUSCULAR | Status: DC | PRN
  Administered 2015-06-11 – 2015-06-12 (×4): 4 mg via INTRAVENOUS
  Filled 2015-06-11 (×3): qty 2

## 2015-06-11 MED ORDER — SODIUM CHLORIDE 0.9 % IV SOLN
INTRAVENOUS | Status: DC
  Administered 2015-06-11 – 2015-06-14 (×8): via INTRAVENOUS

## 2015-06-11 MED ORDER — GLYCOPYRROLATE 0.2 MG/ML IJ SOLN
INTRAMUSCULAR | Status: DC | PRN
  Administered 2015-06-11: 0.6 mg via INTRAVENOUS

## 2015-06-11 MED ORDER — ONDANSETRON HCL 4 MG/2ML IJ SOLN
INTRAMUSCULAR | Status: DC | PRN
  Administered 2015-06-11: 4 mg via INTRAVENOUS

## 2015-06-11 MED ORDER — ACETAMINOPHEN 10 MG/ML IV SOLN
1000.0000 mg | Freq: Four times a day (QID) | INTRAVENOUS | Status: AC
  Administered 2015-06-11 – 2015-06-12 (×4): 1000 mg via INTRAVENOUS
  Filled 2015-06-11 (×4): qty 100

## 2015-06-11 MED ORDER — PROPOFOL 10 MG/ML IV BOLUS
INTRAVENOUS | Status: AC
  Filled 2015-06-11: qty 20

## 2015-06-11 MED ORDER — CEFAZOLIN SODIUM 1-5 GM-% IV SOLN
1.0000 g | Freq: Three times a day (TID) | INTRAVENOUS | Status: AC
  Administered 2015-06-11 – 2015-06-12 (×2): 1 g via INTRAVENOUS
  Filled 2015-06-11 (×2): qty 50

## 2015-06-11 MED ORDER — HYDROCODONE-ACETAMINOPHEN 5-325 MG PO TABS: 1.0000 | ORAL_TABLET | Freq: Four times a day (QID) | ORAL | Status: DC | PRN

## 2015-06-11 MED ORDER — EPHEDRINE SULFATE 50 MG/ML IJ SOLN
INTRAMUSCULAR | Status: AC
  Filled 2015-06-11: qty 1

## 2015-06-11 MED ORDER — HYDROMORPHONE HCL 1 MG/ML IJ SOLN
INTRAMUSCULAR | Status: DC | PRN
  Administered 2015-06-11: 1 mg via INTRAVENOUS
  Administered 2015-06-11: 0.5 mg via INTRAVENOUS
  Administered 2015-06-11: 1 mg via INTRAVENOUS
  Administered 2015-06-11 (×3): 0.5 mg via INTRAVENOUS

## 2015-06-11 MED ORDER — PHENYLEPHRINE 40 MCG/ML (10ML) SYRINGE FOR IV PUSH (FOR BLOOD PRESSURE SUPPORT)
PREFILLED_SYRINGE | INTRAVENOUS | Status: AC
  Filled 2015-06-11: qty 10

## 2015-06-11 MED ORDER — FENTANYL CITRATE (PF) 250 MCG/5ML IJ SOLN
INTRAMUSCULAR | Status: AC
  Filled 2015-06-11: qty 5

## 2015-06-11 MED ORDER — ATORVASTATIN CALCIUM 10 MG PO TABS
10.0000 mg | ORAL_TABLET | Freq: Every morning | ORAL | Status: DC
  Administered 2015-06-12 – 2015-06-14 (×3): 10 mg via ORAL
  Filled 2015-06-11 (×3): qty 1

## 2015-06-11 SURGICAL SUPPLY — 73 items
APL ESCP 34 STRL LF DISP (HEMOSTASIS) ×1
APPLICATOR SURGIFLO ENDO (HEMOSTASIS) ×2 IMPLANT
BAG SPEC RTRVL LRG 6X4 10 (ENDOMECHANICALS) ×1
CABLE HIGH FREQUENCY MONO STRZ (ELECTRODE) ×1 IMPLANT
CHLORAPREP W/TINT 26ML (MISCELLANEOUS) ×3 IMPLANT
CLIP LIGATING HEM O LOK PURPLE (MISCELLANEOUS) ×2 IMPLANT
CLIP LIGATING HEMO LOK XL GOLD (MISCELLANEOUS) ×1 IMPLANT
CLIP LIGATING HEMO O LOK GREEN (MISCELLANEOUS) ×6 IMPLANT
CLIP SUT LAPRA TY ABSORB (SUTURE) ×2 IMPLANT
CORDS BIPOLAR (ELECTRODE) ×1 IMPLANT
COVER SURGICAL LIGHT HANDLE (MISCELLANEOUS) ×1 IMPLANT
COVER TIP SHEARS 8 DVNC (MISCELLANEOUS) ×1 IMPLANT
COVER TIP SHEARS 8MM DA VINCI (MISCELLANEOUS) ×1
CUTTER ECHEON FLEX ENDO 45 340 (ENDOMECHANICALS) IMPLANT
DECANTER SPIKE VIAL GLASS SM (MISCELLANEOUS) ×2 IMPLANT
DRAIN CHANNEL 15F RND FF 3/16 (WOUND CARE) ×2 IMPLANT
DRAPE ARM DVNC X/XI (DISPOSABLE) IMPLANT
DRAPE COLUMN DVNC XI (DISPOSABLE) IMPLANT
DRAPE DA VINCI XI ARM (DISPOSABLE) ×4
DRAPE DA VINCI XI COLUMN (DISPOSABLE) ×1
DRAPE INCISE IOBAN 66X45 STRL (DRAPES) ×2 IMPLANT
DRAPE LAPAROSCOPIC ABDOMINAL (DRAPES) ×1 IMPLANT
DRAPE SHEET LG 3/4 BI-LAMINATE (DRAPES) ×2 IMPLANT
DRAPE TABLE BACK 44X90 PK DISP (DRAPES) ×1 IMPLANT
DRAPE WARM FLUID 44X44 (DRAPE) ×1 IMPLANT
ELECT REM PT RETURN 9FT ADLT (ELECTROSURGICAL) ×2
ELECTRODE REM PT RTRN 9FT ADLT (ELECTROSURGICAL) ×1 IMPLANT
EVACUATOR SILICONE 100CC (DRAIN) ×1 IMPLANT
GLOVE BIO SURGEON STRL SZ 6.5 (GLOVE) ×2 IMPLANT
GLOVE BIOGEL M STRL SZ7.5 (GLOVE) ×4 IMPLANT
GOWN STRL REUS W/TWL LRG LVL3 (GOWN DISPOSABLE) ×5 IMPLANT
HEMOSTAT SURGICEL 4X8 (HEMOSTASIS) ×1 IMPLANT
KIT ACCESSORY DA VINCI DISP (KITS) ×1
KIT ACCESSORY DVNC DISP (KITS) ×1 IMPLANT
KIT BASIN OR (CUSTOM PROCEDURE TRAY) ×2 IMPLANT
LIQUID BAND (GAUZE/BANDAGES/DRESSINGS) ×2 IMPLANT
LOOP VESSEL MAXI BLUE (MISCELLANEOUS) ×2 IMPLANT
NDL INSUFFLATION 14GA 120MM (NEEDLE) ×1 IMPLANT
NEEDLE INSUFFLATION 14GA 120MM (NEEDLE) IMPLANT
NS IRRIG 1000ML POUR BTL (IV SOLUTION) ×2 IMPLANT
PEN SKIN MARKING BROAD (MISCELLANEOUS) ×2 IMPLANT
PENCIL BUTTON HOLSTER BLD 10FT (ELECTRODE) ×2 IMPLANT
POSITIONER SURGICAL ARM (MISCELLANEOUS) ×4 IMPLANT
POUCH SPECIMEN RETRIEVAL 10MM (ENDOMECHANICALS) ×2 IMPLANT
RELOAD WH ECHELON 45 (STAPLE) IMPLANT
SEAL CANN UNIV 5-8 DVNC XI (MISCELLANEOUS) IMPLANT
SEAL XI 5MM-8MM UNIVERSAL (MISCELLANEOUS) ×4
SET TUBE IRRIG SUCTION NO TIP (IRRIGATION / IRRIGATOR) ×1 IMPLANT
SOLUTION ELECTROLUBE (MISCELLANEOUS) ×2 IMPLANT
SPONGE LAP 4X18 X RAY DECT (DISPOSABLE) ×2 IMPLANT
SURGIFLO W/THROMBIN 8M KIT (HEMOSTASIS) ×2 IMPLANT
SUT ETHILON 3 0 PS 1 (SUTURE) ×2 IMPLANT
SUT MNCRL AB 4-0 PS2 18 (SUTURE) ×4 IMPLANT
SUT PDS AB 1 CT1 27 (SUTURE) ×4 IMPLANT
SUT V-LOC BARB 180 2/0GR6 GS22 (SUTURE)
SUT VIC AB 0 CT1 27 (SUTURE) ×8
SUT VIC AB 0 CT1 27XBRD ANTBC (SUTURE) ×4 IMPLANT
SUT VIC AB 2-0 SH 27 (SUTURE) ×4
SUT VIC AB 2-0 SH 27X BRD (SUTURE) ×2 IMPLANT
SUT VICRYL 0 UR6 27IN ABS (SUTURE) ×2 IMPLANT
SUT VLOC BARB 180 ABS3/0GR12 (SUTURE) ×4
SUTURE V-LC BRB 180 2/0GR6GS22 (SUTURE) IMPLANT
SUTURE VLOC BRB 180 ABS3/0GR12 (SUTURE) ×1 IMPLANT
TOWEL OR 17X26 10 PK STRL BLUE (TOWEL DISPOSABLE) ×4 IMPLANT
TOWEL OR NON WOVEN STRL DISP B (DISPOSABLE) ×2 IMPLANT
TRAY FOLEY W/METER SILVER 14FR (SET/KITS/TRAYS/PACK) ×2 IMPLANT
TRAY LAPAROSCOPIC (CUSTOM PROCEDURE TRAY) ×2 IMPLANT
TROCAR 12M 150ML BLUNT (TROCAR) ×2 IMPLANT
TROCAR BLADELESS OPT 5 100 (ENDOMECHANICALS) IMPLANT
TROCAR UNIVERSAL OPT 12M 100M (ENDOMECHANICALS) ×2 IMPLANT
TROCAR XCEL 12X100 BLDLESS (ENDOMECHANICALS) ×2 IMPLANT
TUBING INSUFFLATION 10FT LAP (TUBING) ×1 IMPLANT
WATER STERILE IRR 1500ML POUR (IV SOLUTION) ×3 IMPLANT

## 2015-06-11 NOTE — Progress Notes (Signed)
Pt states C-Pap is non-functioning and therefore did not bring, states hasn't used in >1 year. Also did not bring Proair inhaler today, note placed on front of chart also for OR

## 2015-06-11 NOTE — Plan of Care (Signed)
Problem: Phase I Progression Outcomes Goal: OOB as tolerated unless otherwise ordered Outcome: Not Applicable Date Met:  50/87/19 Pt on bedrest

## 2015-06-11 NOTE — H&P (Signed)
Reason For Visit Left renal mass   History of Present Illness 59 year old female referred by Dr. Karsten Ro, MD for evaluation and management of left enhancing renal mass.  This was discovered incidentally. She had a follow-up MRI, we do not have the images in our system at this point. The patient denies any left flank pain or gross hematuria. She denies any recent weight loss, night sweats, or new bone pain. She has no family history of GU malignancies. She has discussed treatment options with Dr. Karsten Ro and has opted for surgical extirpation.    The patient has a history of laparoscopic cholecystectomy as well as orthopedic procedures past. Her past medical history is largely associated with her weight including morbid obesity, diabetes, obstructive sleep apnea, etc.   Past Medical History Problems  1. History of Diverticulitis (K57.92) 2. History of diabetes mellitus (Z86.39) 3. History of esophageal reflux (Z87.19) 4. History of hypercholesterolemia (Z86.39) 5. History of hypothyroidism (Z86.39) 6. History of sleep apnea (Z87.09)  Surgical History Problems  1. History of Cholecystectomy 2. History of Knee Surgery Left 3. History of Shoulder Surgery Left  Current Meds 1. Atorvastatin Calcium 10 MG Oral Tablet;  Therapy: (Recorded:07Jun2016) to Recorded 2. Furosemide 20 MG Oral Tablet;  Therapy: (Recorded:07Jun2016) to Recorded 3. Glimepiride 2 MG Oral Tablet;  Therapy: (Recorded:07Jun2016) to Recorded 4. Levothyroxine Sodium 75 MCG Oral Tablet;  Therapy: (Recorded:07Jun2016) to Recorded 5. Lisinopril 10 MG Oral Tablet;  Therapy: (Recorded:07Jun2016) to Recorded 6. MetFORMIN HCl - 500 MG Oral Tablet; takes 2 tablets BID;  Therapy: (Recorded:07Jun2016) to Recorded  Allergies Medication  1. No Known Drug Allergies  Family History Problems  1. Family history of malignant neoplasm of urinary bladder (Z80.52) : Father  Social History Problems  1. Denied: History of  Alcohol use 2. Caffeine use (F15.90)   1 3. Married 4. Mother deceased 61. Never a smoker 6. Number of children   1 son; 1 daughter 40. 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 No acute distress.  Pulmonary: No respiratory distress and normal respiratory rhythm and effort.  Cardiovascular: Heart rate and rhythm are normal . No peripheral edema.  Abdomen: The abdomen is obese. The abdomen is soft and nontender. No masses are palpated. No CVA tenderness. No hernias are palpable. No hepatosplenomegaly noted.  Lymphatics: The posterior cervical, anterior cervical and supraclavicular nodes are not enlarged or tender.  Skin: Normal skin turgor, no visible rash and no visible skin lesions.  Neuro/Psych:. Mood and affect are appropriate.    Results/Data Urine [Data Includes: Last 1 Day]   [id number]  COLOR YELLOW   APPEARANCE CLEAR   SPECIFIC GRAVITY 1.020   pH 5.0   GLUCOSE NEG mg/dL  BILIRUBIN NEG   KETONE TRACE mg/dL  BLOOD NEG   PROTEIN NEG mg/dL  UROBILINOGEN 0.2 mg/dL  NITRITE NEG   LEUKOCYTE ESTERASE TRACE   SQUAMOUS EPITHELIAL/HPF MODERATE   WBC 0-2 WBC/hpf  RBC 0-2 RBC/hpf  BACTERIA RARE   CRYSTALS NONE SEEN   CASTS NONE SEEN   Other MUCUS NOTED    Patient's urinalysis today is negative  I independently reviewed the patient's CT scan demonstrating a 4 cm mid pole enhancing left renal mass. This does abut the collecting system. There appears to be one renal artery and one renal vein. There is no  lymphadenopathy. There is no evidence of metastasis.   Assessment Assessed  1. 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   Plan Health Maintenance  1. UA With REFLEX; [Do Not Release]; Status:Resulted - Requires Verification;    Done:  [id number] 03:08PM Renal neoplasm  2. Follow-up Schedule Surgery Office  Follow-up  Status: Hold For - Appointment   Requested for: [phone]  Discussion/Summary  The patient has been given the natural history of renal cancer, treatment options, and recommended surgical extirpation for this patient. I went over the robotic-assisted laparoscopic partial nephrectomy approach. I described for the patient the procedure in detail including port placement. I detailed the postoperative course including the fact that the patient would have both a drain and a Foley catheter following the surgery. I told the patient that most often patients are discharged on postoperative day one or 2. I then detailed the expected recovery time, I told the patient that he would not be able to lift anything greater than 20 pounds for 4 weeks. I also went over the risks and benefits of this operation in great detail. We discussed the risk of injury to surrounding structures, major blood vessels and nerves, bleeding, infection, loss of kidney, and the risk of recurrent cancer.

## 2015-06-11 NOTE — Op Note (Signed)
Preoperative diagnosis: LEFT renal mass  Postoperative diagnosis: LEFT renal mass  Procedure:  1. LEFT robot-assisted laparoscopic partial nephrectomy 2. Intraoperative renal ultrasonography  Surgeon: Louis Meckel, Brooke Bonito. M.D. 1st Assistant(s): Debbrah Alar, [place] Resident Surgeon: Pietro Cassis. Ottis Stain, M.D.  Anesthesia: General  Complications: None  EBL: 1200 mL  IVF:  Per anesthesia record  Specimens: 1.  LEFT renal mass 2.  Base of LEFT renal mass sent for frozen and returned "normal renal parenchyma, no involvement of tumor" Disposition of specimens: Pathology  Intraoperative findings:       1. Warm renal ischemia time: 21 minutes       2. Intraoperative renal ultrasound findings: tumor measuring nearly 4 cm posterior abutting collecting               system       3. R.E.N.A.L. 1+2+3+p+3 (9p)       4. 2 arteries each clamped with single small bull-dog clamp       5. Extensive peri-and para nephric fat, small portion of renal capsule peeled off with fat causing                    continuous ooze throughout the case       6. Collecting system was entered and closed with a 2-0 V-lock suture       Drains: 1. # 15 Blake perinephric drain 2. 16 Fr foley catheter  Indication:  Carol [name] is a 59 y.o. year old patient with a left renal mass.  After a thorough review of the management options for their renal mass, they elected to proceed with surgical treatment and the above procedure.  We have discussed the potential benefits and risks of the procedure, side effects of the proposed treatment, the likelihood of the patient achieving the goals of the procedure, and any potential problems that might occur during the procedure or recuperation. Informed consent has been obtained.   Description of procedure:  The patient was taken to the operating room and a general anesthetic was administered. The patient was given preoperative antibiotics, placed in the left modified flank position  with care to pad all potential pressure points, and prepped and draped in the usual sterile fashion. Next a preoperative timeout was performed.  A site was selected on the left side of the umbilicus for placement of the camera port. This was placed using a standard open Hassan technique which allowed entry into the peritoneal cavity under direct vision and without difficulty. Two #0 Vicryl stay sutures were pre-placed in the fascia. A 12 mm port was placed and a pneumoperitoneum established. The camera was then used to inspect the abdomen and there was no evidence of any intra-abdominal injuries or other abnormalities. The remaining abdominal ports were then placed. 8 mm robotic ports were placed in linear fashion along the mid-clavicular line with a hands-width between them. All ports were placed under direct vision without difficulty. The surgical cart was then docked.   Utilizing the cautery scissors, the white line of Toldt was incised allowing the colon to be mobilized medially and the plane between the mesocolon and the anterior layer of Gerota's fascia to be developed and the kidney to be exposed. There was extensive perinephric fat which made the dissection quite challenging.  The ureter and gonadal vein were identified inferiorly and the ureter was lifted anteriorly off the psoas muscle.  Dissection proceeded superiorly along the gonadal vein until the renal vein was identified. Small perforating branches  of the gonadal were taken with bipolar cautery. The renal hilum was then carefully isolated with a combination of blunt and sharp dissection allowing the renal arterial and venous structures to be separated and isolated in preparation for renal hilar vessel clamping. 2 renal arteries were identified as well as a single renal vein with 3 branches.  12.5 g of IV mannitol was then administered.   Attention turned to the kidney and the perinephric fat surrounding the renal mass was removed and the kidney  was mobilized sufficiently for exposure and resection of the renal mass. Again, there was extensive perinephric fat which while peeling off dorsum of the renal capsule which caused a continuous ooze throughout the case and contributed to the overall blood loss.  Intraoperative renal ultrasonography was utilized with the laparoscopic ultrasound probe to identify the renal tumor and identify the tumor margins. The tumor was just under 4 cm located in the posterior lower pole and was predominantly endophytic and abutted the collecting system.  Once the renal mass was properly isolated, preparations were made for resection of the tumor.  Reconstructive sutures were placed into the abdomen for the renorrhaphy portion of the procedure.  Both renal arteries were clamped with bulldog clamps.  The tumor was then excised with cold scissor dissection along with an adequate visible gross margin of normal renal parenchyma. The tumor appeared to be excised without any gross violation of the tumor. The renal collecting system was entered during removal of the tumor. A separate frozen section was taken from the tumor base which returned normal kidney parenchyma.  A running 3-0 V-lock suture was then brought through the capsule of the kidney and run along the base of the renal defect to provide hemostasis and close any entry into the renal collecting system if present. Weck clips were used to secure this suture outside the renal capsule at the proximal and distal ends. An additional hemostatic agent (Surgiflo) was then placed into the renal defect. A running 2-0 V lock suture was then used to close the renal capsule using a sliding clip technique which resulted in excellent compression of the renal defect.    The bulldog clamps were then removed from the renal hilar vessel(s) and an additional 12.5 g of IV mannitol was administered. Total warm renal ischemia time was 21 minutes. The renal tumor resection site was examined.  Hemostasis appeared adequate.   The kidney was placed back into its normal anatomic position and covered with perinephric fat as needed.  A # 41 Blake drain was then brought through the lateral lower port site and positioned in the perinephric space.  It was secured to the skin with a nylon suture. The surgical cart was undocked.  The renal tumor specimen was removed intact within an endopouch retrieval bag via the assistant 12 mm port site.  The camera port site and the other 12 mm port site were then closed at the fascial level with 0-vicryl suture.  All other laparoscopic/robotic ports were removed under direct vision and the pneumoperitoneum let down with inspection of the operative field performed and hemostasis again confirmed. All incision sites were then injected with local anesthetic and reapproximated at the skin level with 4-0 monocryl subcuticular closures.  Dermabond was applied to the skin.  The patient tolerated the procedure well and without complications.  The patient was able to be extubated and transferred to the recovery unit in satisfactory condition.   Dr. Louis Meckel was present and scrubbed for the entirety of  the procedure

## 2015-06-11 NOTE — Anesthesia Procedure Notes (Signed)
Procedure Name: Intubation Date/Time: 06/11/2015 11:44 AM Performed by: Glory Buff Pre-anesthesia Checklist: Patient identified, Emergency Drugs available, Suction available and Patient being monitored Patient Re-evaluated:Patient Re-evaluated prior to inductionOxygen Delivery Method: Circle System Utilized Preoxygenation: Pre-oxygenation with 100% oxygen Intubation Type: IV induction Ventilation: Mask ventilation without difficulty Laryngoscope Size: Mac and 3 Grade View: Grade I Tube type: Oral Tube size: 7.5 mm Number of attempts: 1 Airway Equipment and Method: Stylet and Oral airway Placement Confirmation: ETT inserted through vocal cords under direct vision,  positive ETCO2 and breath sounds checked- equal and bilateral Secured at: 19 cm Tube secured with: Tape Dental Injury: Teeth and Oropharynx as per pre-operative assessment

## 2015-06-11 NOTE — Discharge Instructions (Signed)
·   Activity:  You are encouraged to ambulate frequently (about every hour during waking hours) to help prevent blood clots from forming in your legs or lungs.  However, you should not engage in any heavy lifting (> 10-15 lbs), strenuous activity, or straining.   Diet: You should advance your diet as instructed by your physician.  It will be normal to have some bloating, nausea, and abdominal discomfort intermittently.   Prescriptions:  You will be provided a prescription for pain medication to take as needed.  If your pain is not severe enough to require the prescription pain medication, you may take extra strength Tylenol instead which will have less side effects.  You should also take a prescribed stool softener to avoid straining with bowel movements as the prescription pain medication may constipate you.   Incisions: You may remove your dressing bandages 48 hours after surgery if not removed in the hospital.  You will either have some small staples or special tissue glue at each of the incision sites. Once the bandages are removed (if present), the incisions may stay open to air.  You may start showering (but not soaking or bathing in water) the 2nd day after surgery and the incisions simply need to be patted dry after the shower.  No additional care is needed.   What to call us about: You should call the office [phone]) if you develop fever > 101 or develop persistent vomiting. Activity:  You are encouraged to ambulate frequently (about every hour during waking hours) to help prevent blood clots from forming in your legs or lungs.  However, you should not engage in any heavy lifting (> 10-15 lbs), strenuous activity, or straining.  You may resume aspirin, advil, aleve, vitamins, and supplements 7 days after surgery.

## 2015-06-11 NOTE — Anesthesia Preprocedure Evaluation (Addendum)
Anesthesia Evaluation  Patient identified by MRN, date of birth, ID band Patient awake    Reviewed: Allergy & Precautions, H&P , NPO status , Patient's Chart, lab work & pertinent test results  Airway Mallampati: II  TM Distance: >3 FB Neck ROM: Full    Dental no notable dental hx.    Pulmonary sleep apnea and Continuous Positive Airway Pressure Ventilation ,  breath sounds clear to auscultation  Pulmonary exam normal       Cardiovascular negative cardio ROS Normal cardiovascular examRhythm:Regular Rate:Normal     Neuro/Psych negative neurological ROS  negative psych ROS   GI/Hepatic negative GI ROS, Neg liver ROS,   Endo/Other  diabetes, Type 2, Oral Hypoglycemic AgentsHypothyroidism Morbid obesity  Renal/GU negative Renal ROS  negative genitourinary   Musculoskeletal negative musculoskeletal ROS (+)   Abdominal   Peds negative pediatric ROS (+)  Hematology negative hematology ROS (+)   Anesthesia Other Findings   Reproductive/Obstetrics negative OB ROS                           Anesthesia Physical  Anesthesia Plan  ASA: III  Anesthesia Plan: General   Post-op Pain Management:    Induction: Intravenous  Airway Management Planned: Oral ETT  Additional Equipment:   Intra-op Plan:   Post-operative Plan: Extubation in OR  Informed Consent: I have reviewed the patients History and Physical, chart, labs and discussed the procedure including the risks, benefits and alternatives for the proposed anesthesia with the patient or authorized representative who has indicated his/her understanding and acceptance.   Dental advisory given  Plan Discussed with:   Anesthesia Plan Comments:         Anesthesia Quick Evaluation

## 2015-06-11 NOTE — Transfer of Care (Signed)
Immediate Anesthesia Transfer of Care Note  Patient: Carol [name]  Procedure(s) Performed: Procedure(s): LEFT ROBOTIC ASSISTED LAPAROSCOPIC  PARTIAL NEPHRECTOMY (Left)  Patient Location: PACU  Anesthesia Type:General  Level of Consciousness:  sedated, patient cooperative and responds to stimulation  Airway & Oxygen Therapy:Patient Spontanous Breathing and Patient connected to face mask oxgen  Post-op Assessment:  Report given to PACU RN and Post -op Vital signs reviewed and stable  Post vital signs:  Reviewed and stable  Last Vitals:  Filed Vitals:   06/11/15 0904  BP: 120/74  Pulse: 79  Temp: 36.5 C  Resp: 16    Complications: No apparent anesthesia complications

## 2015-06-12 LAB — GLUCOSE, CAPILLARY
GLUCOSE-CAPILLARY: 176 mg/dL — AB (ref 65–99)
GLUCOSE-CAPILLARY: 223 mg/dL — AB (ref 65–99)
Glucose-Capillary: 156 mg/dL — ABNORMAL HIGH (ref 65–99)
Glucose-Capillary: 177 mg/dL — ABNORMAL HIGH (ref 65–99)
Glucose-Capillary: 204 mg/dL — ABNORMAL HIGH (ref 65–99)
Glucose-Capillary: 239 mg/dL — ABNORMAL HIGH (ref 65–99)
Glucose-Capillary: 312 mg/dL — ABNORMAL HIGH (ref 65–99)

## 2015-06-12 LAB — BASIC METABOLIC PANEL
ANION GAP: 7 (ref 5–15)
BUN: 14 mg/dL (ref 6–20)
CALCIUM: 8 mg/dL — AB (ref 8.9–10.3)
CO2: 26 mmol/L (ref 22–32)
Chloride: 104 mmol/L (ref 101–111)
Creatinine, Ser: 1.01 mg/dL — ABNORMAL HIGH (ref 0.44–1.00)
GFR calc Af Amer: >60 mL/min (ref >60)
GFR, EST NON AFRICAN AMERICAN: 60 mL/min — AB (ref >60)
GLUCOSE: 250 mg/dL — AB (ref 65–99)
POTASSIUM: 4.8 mmol/L (ref 3.5–5.1)
SODIUM: 137 mmol/L (ref 135–145)

## 2015-06-12 LAB — HEMOGLOBIN AND HEMATOCRIT, BLOOD
HCT: 25.5 % — ABNORMAL LOW (ref 36.0–46.0)
Hemoglobin: 9.1 g/dL — ABNORMAL LOW (ref 12.0–15.0)

## 2015-06-12 LAB — CREATININE, FLUID (PLEURAL, PERITONEAL, JP DRAINAGE): Creat, Fluid: 0.9 mg/dL

## 2015-06-12 MED ORDER — OXYCODONE HCL 5 MG PO TABS
5.0000 mg | ORAL_TABLET | ORAL | Status: DC | PRN
  Administered 2015-06-12 (×3): 5 mg via ORAL
  Filled 2015-06-12 (×4): qty 1

## 2015-06-12 MED ORDER — OXYCODONE-ACETAMINOPHEN 5-325 MG PO TABS: 1.0000 | ORAL_TABLET | ORAL | Status: DC | PRN

## 2015-06-12 NOTE — Progress Notes (Signed)
Utilization Review completed. Lataya Varnell RN BSN CM 

## 2015-06-12 NOTE — Assessment & Plan Note (Addendum)
She is doing well post op with minimal JP output.  She chest and shoulder pain have improved but she is still weak and not ambulating well yet.  She has had some increased gas pain.   JP fluid Cr was serum level.    I will have the drained removed.    She would like to stay one more day.  I have encouraged ambulation and will repeat labs in AM prior to DC.

## 2015-06-12 NOTE — Progress Notes (Signed)
Patient ID: Carol [name], female   DOB: May 04, 1956, 59 y.o.   MRN: [id number] 1 Day Post-Op  Subjective: Carol [name] is POD 1 from a left PN.   She had significant bleeding at surgery but her hgb is stable at 9.1.  She had nausea last night but that has abated.  She has some chest and back pain that she feels is musculoskeletal and makes it hard to take a deep breath.   Her foley has been removed this morning but she has not voided yet.  Her JP drainage is declining and is serous.   ROS:  Review of Systems  Constitutional: Negative for fever and chills.  Respiratory: Positive for shortness of breath. Negative for cough.   Cardiovascular: Positive for chest pain (musculoskeletal).  Gastrointestinal: Negative for nausea and vomiting.       She has been burping but has not passed flatus.  Musculoskeletal: Positive for back pain.       Chest and right shoulder.    Anti-infectives: Anti-infectives    Start     Dose/Rate Route Frequency Ordered Stop   06/11/15 2000  ceFAZolin (ANCEF) IVPB 1 g/50 mL premix     1 g 100 mL/hr over 30 Minutes Intravenous Every 8 hours 06/11/15 1802 06/12/15 0509   06/11/15 1230  ceFAZolin (ANCEF) IVPB 2 g/50 mL premix     2 g 100 mL/hr over 30 Minutes Intravenous  Once 06/11/15 1226 06/11/15 1225   06/11/15 0900  ciprofloxacin (CIPRO) IVPB 400 mg     400 mg 200 mL/hr over 60 Minutes Intravenous 60 min pre-op 06/11/15 0900 06/11/15 1245      Current Facility-Administered Medications  Medication Dose Route Frequency Provider Last Rate Last Dose  . 0.9 %  sodium chloride infusion   Intravenous Continuous Ardis Hughs, MD 125 mL/hr at 06/12/15 [phone]    . acetaminophen (OFIRMEV) IV 1,000 mg  1,000 mg Intravenous 4 times per day Star Age, MD   1,000 mg at 06/12/15 0610  . albuterol (PROVENTIL) (2.5 MG/3ML) 0.083% nebulizer solution 3 mL  3 mL Inhalation Q4H PRN Star Age, MD      . atorvastatin (LIPITOR) tablet 10 mg  10 mg Oral q morning - 10a  Star Age, MD      . diphenhydrAMINE (BENADRYL) injection 12.5-25 mg  12.5-25 mg Intravenous Q6H PRN Star Age, MD       Or  . diphenhydrAMINE (BENADRYL) 12.5 MG/5ML elixir 12.5-25 mg  12.5-25 mg Oral Q6H PRN Star Age, MD      . docusate sodium (COLACE) capsule 100 mg  100 mg Oral BID Star Age, MD   100 mg at 06/11/15 2209  . insulin aspart (novoLOG) injection 0-15 Units  0-15 Units Subcutaneous 6 times per day Star Age, MD   3 Units at 06/12/15 0756  . levothyroxine (SYNTHROID, LEVOTHROID) tablet 75 mcg  75 mcg Oral QAC breakfast Star Age, MD   75 mcg at 06/12/15 0801  . morphine 2 MG/ML injection 2-4 mg  2-4 mg Intravenous Q2H PRN Star Age, MD      . ondansetron [facility]) injection 4 mg  4 mg Intravenous Q4H PRN Star Age, MD   4 mg at 06/12/15 1191     Objective: Vital signs in last 24 hours: Temp:  [97.6 F (36.4 C)-99.1 F (37.3 C)] 97.9 F (36.6 C) (07/23 0531) Pulse Rate:  [76-94] 88 (07/23 0531) Resp:  [10-18] 18 (07/23 0531) BP: (88-119)/(47-68) 107/57 mmHg (07/23 0531) SpO2:  [  96 %-100 %] 99 % (07/23 0531)  Intake/Output from previous day: 07/22 0701 - 07/23 0700 In: 4641.3 [P.O.:120; I.V.:3871.3; IV Piggyback:650] Out: 2735 [Urine:1075; Drains:460; Blood:1200] Intake/Output this shift: Total I/O In: 474.2 [P.O.:120; I.V.:354.2] Out: -    Physical Exam  Lab Results:   Recent Labs  06/11/15 1648 06/12/15 0527  HGB 9.1* 9.1*  HCT 26.6* 25.5*   BMET  Recent Labs  06/11/15 1648 06/12/15 0527  NA 136 137  K 4.4 4.8  CL 105 104  CO2 22 26  GLUCOSE 319* 250*  BUN 13 14  CREATININE 0.91 1.01*  CALCIUM 7.7* 8.0*   PT/INR No results for input(s): LABPROT, INR in the last 72 hours. ABG No results for input(s): PHART, HCO3 in the last 72 hours.  Invalid input(s): PCO2, PO2  Studies/Results: No results found.   Assessment and Plan: Renal neoplasm She is doing well post op with declining JP output and a stable  Hgb.  She has some musculoskeletal chest and shoulder pain that is making it difficult for her to move in the bed.   I am going to send the JP fluid for Cr and remove the drain if that is normal.  Advance diet. I have encouraged her to ambulate and she is anxious to do that.          LOS: 1 day    Khris Jansson J 06/12/2015

## 2015-06-13 DIAGNOSIS — E119 Type 2 diabetes mellitus without complications: Secondary | ICD-10-CM

## 2015-06-13 DIAGNOSIS — D62 Acute posthemorrhagic anemia: Secondary | ICD-10-CM | POA: Diagnosis not present

## 2015-06-13 LAB — GLUCOSE, CAPILLARY
GLUCOSE-CAPILLARY: 195 mg/dL — AB (ref 65–99)
GLUCOSE-CAPILLARY: 179 mg/dL — AB (ref 65–99)
Glucose-Capillary: 185 mg/dL — ABNORMAL HIGH (ref 65–99)
Glucose-Capillary: 187 mg/dL — ABNORMAL HIGH (ref 65–99)
Glucose-Capillary: 170 mg/dL — ABNORMAL HIGH (ref 65–99)

## 2015-06-13 MED ORDER — ACETAMINOPHEN 325 MG PO TABS
650.0000 mg | ORAL_TABLET | ORAL | Status: DC | PRN
  Administered 2015-06-13 – 2015-06-15 (×6): 650 mg via ORAL
  Filled 2015-06-13 (×6): qty 2

## 2015-06-13 NOTE — Assessment & Plan Note (Signed)
Her Hgb was 9.1 post op but stable.   It will be repeated tomorrow.

## 2015-06-13 NOTE — Assessment & Plan Note (Signed)
Her CBG's have been in the 180's to 200.

## 2015-06-13 NOTE — Progress Notes (Signed)
Patient ID: Carol [name], female   DOB: May 10, 1956, 59 y.o.   MRN: [id number] 2 Days Post-Op  Subjective: She continues to improve but remains weak.  The chest and shoulder pain have improved.  She has gas pain but is passing flatus. ROS:  Review of Systems  Constitutional: Negative for fever and chills.  Respiratory: Negative for shortness of breath.   Cardiovascular: Negative for chest pain.  Gastrointestinal: Negative for abdominal pain.    Anti-infectives: Anti-infectives    Start     Dose/Rate Route Frequency Ordered Stop   06/11/15 2000  ceFAZolin (ANCEF) IVPB 1 g/50 mL premix     1 g 100 mL/hr over 30 Minutes Intravenous Every 8 hours 06/11/15 1802 06/12/15 0509   06/11/15 1230  ceFAZolin (ANCEF) IVPB 2 g/50 mL premix     2 g 100 mL/hr over 30 Minutes Intravenous  Once 06/11/15 1226 06/11/15 1225   06/11/15 0900  ciprofloxacin (CIPRO) IVPB 400 mg     400 mg 200 mL/hr over 60 Minutes Intravenous 60 min pre-op 06/11/15 0900 06/11/15 1245      Current Facility-Administered Medications  Medication Dose Route Frequency Provider Last Rate Last Dose  . 0.9 %  sodium chloride infusion   Intravenous Continuous Ardis Hughs, MD 125 mL/hr at 06/13/15 [phone]    . albuterol (PROVENTIL) (2.5 MG/3ML) 0.083% nebulizer solution 3 mL  3 mL Inhalation Q4H PRN Star Age, MD      . atorvastatin (LIPITOR) tablet 10 mg  10 mg Oral q morning - 10a Star Age, MD   10 mg at 06/12/15 0949  . diphenhydrAMINE (BENADRYL) injection 12.5-25 mg  12.5-25 mg Intravenous Q6H PRN Star Age, MD       Or  . diphenhydrAMINE (BENADRYL) 12.5 MG/5ML elixir 12.5-25 mg  12.5-25 mg Oral Q6H PRN Star Age, MD      . docusate sodium (COLACE) capsule 100 mg  100 mg Oral BID Star Age, MD   100 mg at 06/12/15 2042  . insulin aspart (novoLOG) injection 0-15 Units  0-15 Units Subcutaneous 6 times per day Star Age, MD   3 Units at 06/13/15 0754  . levothyroxine (SYNTHROID, LEVOTHROID) tablet 75  mcg  75 mcg Oral QAC breakfast Star Age, MD   75 mcg at 06/13/15 0754  . morphine 2 MG/ML injection 2-4 mg  2-4 mg Intravenous Q2H PRN Star Age, MD      . ondansetron [facility]) injection 4 mg  4 mg Intravenous Q4H PRN Star Age, MD   4 mg at 06/12/15 1126  . oxyCODONE (Oxy IR/ROXICODONE) immediate release tablet 5 mg  5 mg Oral Q4H PRN Irine Seal, MD   5 mg at 06/12/15 2122     Objective: Vital signs in last 24 hours: Temp:  [98.5 F (36.9 C)-99.5 F (37.5 C)] 98.9 F (37.2 C) (07/24 0434) Pulse Rate:  [94-115] 115 (07/24 0434) Resp:  [18-19] 19 (07/24 0434) BP: (105-142)/(52-60) 125/60 mmHg (07/24 0434) SpO2:  [93 %-97 %] 97 % (07/24 0434)  Intake/Output from previous day: 07/23 0701 - 07/24 0700 In: 3065 [P.O.:240; I.V.:2775] Out: 90 [Drains:90] Intake/Output this shift: Total I/O In: -  Out: 20 [Drains:20]   Physical Exam  Lab Results:   Recent Labs  06/11/15 1648 06/12/15 0527  HGB 9.1* 9.1*  HCT 26.6* 25.5*   BMET  Recent Labs  06/11/15 1648 06/12/15 0527  NA 136 137  K 4.4 4.8  CL 105 104  CO2 22 26  GLUCOSE 319*  250*  BUN 13 14  CREATININE 0.91 1.01*  CALCIUM 7.7* 8.0*   PT/INR No results for input(s): LABPROT, INR in the last 72 hours. ABG No results for input(s): PHART, HCO3 in the last 72 hours.  Invalid input(s): PCO2, PO2  Studies/Results: No results found.   Assessment and Plan: Renal neoplasm She is doing well post op with minimal JP output.  She chest and shoulder pain have improved but she is still weak and not ambulating well yet.  She has had some increased gas pain.   JP fluid Cr was serum level.    I will have the drained removed.    She would like to stay one more day.  I have encouraged ambulation and will repeat labs in AM prior to DC.    Diabetes Her CBG's have been in the 180's to 200.  Postoperative anemia due to acute blood loss Her Hgb was 9.1 post op but stable.   It will be repeated tomorrow.          LOS: 2 days    Malka So 06/13/2015

## 2015-06-13 NOTE — Anesthesia Postprocedure Evaluation (Signed)
  Anesthesia Post-op Note  Patient: Carol [name]  Procedure(s) Performed: Procedure(s) (LRB): LEFT ROBOTIC ASSISTED LAPAROSCOPIC  PARTIAL NEPHRECTOMY (Left)  Patient Location: PACU  Anesthesia Type: General  Level of Consciousness: awake and alert   Airway and Oxygen Therapy: Patient Spontanous Breathing  Post-op Pain: mild  Post-op Assessment: Post-op Vital signs reviewed, Patient's Cardiovascular Status Stable, Respiratory Function Stable, Patent Airway and No signs of Nausea or vomiting  Last Vitals:  Filed Vitals:   06/13/15 0434  BP: 125/60  Pulse: 115  Temp: 37.2 C  Resp: 19    Post-op Vital Signs: stable   Complications: No apparent anesthesia complications

## 2015-06-14 ENCOUNTER — Encounter (HOSPITAL_COMMUNITY): Payer: Self-pay | Admitting: Urology

## 2015-06-14 LAB — HEMOGLOBIN AND HEMATOCRIT, BLOOD
HCT: 18.2 % — ABNORMAL LOW (ref 36.0–46.0)
HCT: 19.5 % — ABNORMAL LOW (ref 36.0–46.0)
HEMOGLOBIN: 6.6 g/dL — AB (ref 12.0–15.0)
Hemoglobin: 6.2 g/dL — CL (ref 12.0–15.0)

## 2015-06-14 LAB — GLUCOSE, CAPILLARY
GLUCOSE-CAPILLARY: 158 mg/dL — AB (ref 65–99)
GLUCOSE-CAPILLARY: 186 mg/dL — AB (ref 65–99)
Glucose-Capillary: 154 mg/dL — ABNORMAL HIGH (ref 65–99)
Glucose-Capillary: 164 mg/dL — ABNORMAL HIGH (ref 65–99)
Glucose-Capillary: 183 mg/dL — ABNORMAL HIGH (ref 65–99)
Glucose-Capillary: 188 mg/dL — ABNORMAL HIGH (ref 65–99)

## 2015-06-14 LAB — BASIC METABOLIC PANEL
ANION GAP: 4 — AB (ref 5–15)
BUN: 6 mg/dL (ref 6–20)
CO2: 24 mmol/L (ref 22–32)
CREATININE: 0.62 mg/dL (ref 0.44–1.00)
Calcium: 7.4 mg/dL — ABNORMAL LOW (ref 8.9–10.3)
Chloride: 112 mmol/L — ABNORMAL HIGH (ref 101–111)
GFR calc Af Amer: >60 mL/min (ref >60)
GFR calc non Af Amer: >60 mL/min (ref >60)
GLUCOSE: 165 mg/dL — AB (ref 65–99)
POTASSIUM: 3.3 mmol/L — AB (ref 3.5–5.1)
Sodium: 140 mmol/L (ref 135–145)

## 2015-06-14 MED ORDER — POTASSIUM CHLORIDE 20 MEQ PO PACK
40.0000 meq | PACK | Freq: Two times a day (BID) | ORAL | Status: DC
Start: 2015-06-14 — End: 2015-06-14
  Filled 2015-06-14 (×2): qty 2

## 2015-06-14 MED ORDER — POTASSIUM CHLORIDE CRYS ER 20 MEQ PO TBCR
40.0000 meq | EXTENDED_RELEASE_TABLET | Freq: Two times a day (BID) | ORAL | Status: DC
  Administered 2015-06-14 (×2): 40 meq via ORAL
  Filled 2015-06-14 (×2): qty 2

## 2015-06-14 NOTE — Progress Notes (Addendum)
Patient ID: Carol [name], female   DOB: Dec 31, 1955, 59 y.o.   MRN: [id number] 3 Days Post-Op  Subjective: NAEON. Ambulating, passing BM and flatus. Tolerating hearty breakfast this AM. Hb came back low at 6.2 from stability in the 9 range post-operatively. Asymptomatic.  ROS:  Review of Systems  Constitutional: Negative for fever and chills.  Respiratory: Negative for shortness of breath.   Cardiovascular: Negative for chest pain.  Gastrointestinal: Negative for abdominal pain.    Anti-infectives: Anti-infectives    Start     Dose/Rate Route Frequency Ordered Stop   06/11/15 2000  ceFAZolin (ANCEF) IVPB 1 g/50 mL premix     1 g 100 mL/hr over 30 Minutes Intravenous Every 8 hours 06/11/15 1802 06/12/15 0509   06/11/15 1230  ceFAZolin (ANCEF) IVPB 2 g/50 mL premix     2 g 100 mL/hr over 30 Minutes Intravenous  Once 06/11/15 1226 06/11/15 1225   06/11/15 0900  ciprofloxacin (CIPRO) IVPB 400 mg     400 mg 200 mL/hr over 60 Minutes Intravenous 60 min pre-op 06/11/15 0900 06/11/15 1245      Current Facility-Administered Medications  Medication Dose Route Frequency Provider Last Rate Last Dose  . 0.9 %  sodium chloride infusion   Intravenous Continuous Ardis Hughs, MD 125 mL/hr at 06/14/15 [phone]    . acetaminophen (TYLENOL) tablet 650 mg  650 mg Oral Q4H PRN Irine Seal, MD   650 mg at 06/14/15 0432  . albuterol (PROVENTIL) (2.5 MG/3ML) 0.083% nebulizer solution 3 mL  3 mL Inhalation Q4H PRN Star Age, MD      . atorvastatin (LIPITOR) tablet 10 mg  10 mg Oral q morning - 10a Star Age, MD   10 mg at 06/13/15 0933  . diphenhydrAMINE (BENADRYL) injection 12.5-25 mg  12.5-25 mg Intravenous Q6H PRN Star Age, MD       Or  . diphenhydrAMINE (BENADRYL) 12.5 MG/5ML elixir 12.5-25 mg  12.5-25 mg Oral Q6H PRN Star Age, MD      . docusate sodium (COLACE) capsule 100 mg  100 mg Oral BID Star Age, MD   100 mg at 06/13/15 2110  . insulin aspart (novoLOG) injection 0-15  Units  0-15 Units Subcutaneous 6 times per day Star Age, MD   3 Units at 06/14/15 0431  . levothyroxine (SYNTHROID, LEVOTHROID) tablet 75 mcg  75 mcg Oral QAC breakfast Star Age, MD   75 mcg at 06/13/15 0754  . morphine 2 MG/ML injection 2-4 mg  2-4 mg Intravenous Q2H PRN Star Age, MD      . ondansetron [facility]) injection 4 mg  4 mg Intravenous Q4H PRN Star Age, MD   4 mg at 06/12/15 1126  . oxyCODONE (Oxy IR/ROXICODONE) immediate release tablet 5 mg  5 mg Oral Q4H PRN Irine Seal, MD   5 mg at 06/12/15 2122     Objective: Vital signs in last 24 hours: Temp:  [98.5 F (36.9 C)-98.9 F (37.2 C)] 98.8 F (37.1 C) (07/25 0420) Pulse Rate:  [91-107] 102 (07/25 0420) Resp:  [16-18] 16 (07/25 0420) BP: (126-140)/(60-69) 140/69 mmHg (07/25 0420) SpO2:  [94 %-96 %] 96 % (07/25 0420)  Intake/Output from previous day: 07/24 0701 - 07/25 0700 In: 740 [P.O.:240; I.V.:500] Out: 20 [Drains:20] Intake/Output this shift:     Physical Exam  NAD Atraumatic normocephalic abd soft, NT/ND, jp site c/d/i, incisions c/d/i No foley Extremities warm, well perfused  Lab Results:   Recent Labs  06/12/15 0527 06/14/15 0615  HGB 9.1* 6.2*  HCT 25.5* 18.2*   BMET  Recent Labs  06/12/15 0527 06/14/15 0615  NA 137 140  K 4.8 3.3*  CL 104 112*  CO2 26 24  GLUCOSE 250* 165*  BUN 14 6  CREATININE 1.01* 0.62  CALCIUM 8.0* 7.4*   PT/INR No results for input(s): LABPROT, INR in the last 72 hours. ABG No results for input(s): PHART, HCO3 in the last 72 hours.  Invalid input(s): PCO2, PO2  Studies/Results: No results found.   Assessment and Plan: 59yF POD 3 s/p LEFT partial Nx.   -STAT repeat Hb (6.2 this morning from stability at 9 over weekend), potentially spurrious, asymptomatic -MLIV -Continue regular diet, bowel regimen -OOB/AMB -If labs stable will plan for d/c later this morning      LOS: 3 days    Star Age 06/14/2015  Hgb 6.6.  Will  continue to monitor for 24 hours.  Pt clinically very stable and will not transfuse at this time unless she becomes symptomatic or unstable.  If Hgb is stable over next 24 hrs, will d/c home.

## 2015-06-14 NOTE — Progress Notes (Signed)
CRITICAL VALUE ALERT  Critical value received:  Hgb 6.2  Date of notification:  06/14/15  Time of notification:  0700  Critical value read back:Yes.    Nurse who received alert:  Virgina Norfolk  MD notified (1st page): [place]  Time of first page:   0705  MD notified (2nd page):   Time of second page:  Responding MD: Ottis Stain  Time MD responded:  [phone]

## 2015-06-15 LAB — GLUCOSE, CAPILLARY
Glucose-Capillary: 146 mg/dL — ABNORMAL HIGH (ref 65–99)
Glucose-Capillary: 148 mg/dL — ABNORMAL HIGH (ref 65–99)

## 2015-06-15 LAB — BASIC METABOLIC PANEL
ANION GAP: 9 (ref 5–15)
BUN: 7 mg/dL (ref 6–20)
CO2: 24 mmol/L (ref 22–32)
Calcium: 8.2 mg/dL — ABNORMAL LOW (ref 8.9–10.3)
Chloride: 108 mmol/L (ref 101–111)
Creatinine, Ser: 0.78 mg/dL (ref 0.44–1.00)
GFR calc Af Amer: >60 mL/min (ref >60)
GFR calc non Af Amer: >60 mL/min (ref >60)
Glucose, Bld: 184 mg/dL — ABNORMAL HIGH (ref 65–99)
Potassium: 3.8 mmol/L (ref 3.5–5.1)
SODIUM: 141 mmol/L (ref 135–145)

## 2015-06-15 LAB — HEMOGLOBIN AND HEMATOCRIT, BLOOD
HEMATOCRIT: 21.6 % — AB (ref 36.0–46.0)
HEMOGLOBIN: 7.3 g/dL — AB (ref 12.0–15.0)

## 2015-06-15 MED ORDER — OXYCODONE-ACETAMINOPHEN 5-325 MG PO TABS: 1.0000 | ORAL_TABLET | ORAL | Status: DC | PRN

## 2015-06-15 MED ORDER — DOCUSATE SODIUM 100 MG PO CAPS: 100.0000 mg | ORAL_CAPSULE | Freq: Two times a day (BID) | ORAL | Status: DC

## 2015-06-15 NOTE — Progress Notes (Signed)
Patient ID: Carol [name], female   DOB: [birth year], 59 y.o.   MRN: [id number]  4 Days Post-Op Subjective: Pt doing well.  Some mild weakness and headache yesterday but now improved. Ambulating well, passing flatus. Pain controlled with Tylenol.  Objective: Vital signs in last 24 hours: Temp:  [98.5 F (36.9 C)-99.2 F (37.3 C)] 98.5 F (36.9 C) (07/26 0416) Pulse Rate:  [94-108] 94 (07/26 0416) Resp:  [16-19] 19 (07/26 0416) BP: (120-131)/(62-68) 129/64 mmHg (07/26 0416) SpO2:  [98 %-100 %] 100 % (07/26 0416)  Intake/Output from previous day: 07/25 0701 - 07/26 0700 In: 960 [P.O.:960] Out: 1450 [Urine:1450] Intake/Output this shift: Total I/O In: 240 [P.O.:240] Out: 900 [Urine:900]  Physical Exam:  General: Alert and oriented CV: RRR Lungs: Clear Abdomen: Soft, ND Incisions: C/D/I Ext: NT, No erythema  Lab Results:  Recent Labs  06/14/15 0615 06/14/15 0730 06/15/15 0507  HGB 6.2* 6.6* 7.3*  HCT 18.2* 19.5* 21.6*   BMET  Recent Labs  06/14/15 0615 06/15/15 0507  NA 140 141  K 3.3* 3.8  CL 112* 108  CO2 24 24  GLUCOSE 165* 184*  BUN 6 7  CREATININE 0.62 0.78  CALCIUM 7.4* 8.2*     Studies/Results: No results found.  Assessment/Plan: - POD # 4 s/p partial nephrectomy - Hgb stable, D/C home   LOS: 4 days   Carol [name],LES 06/15/2015, 6:49 AM

## 2015-06-15 NOTE — Discharge Summary (Signed)
Date of admission: 06/11/2015  Date of discharge: 06/15/2015  Admission diagnosis: Renal cancer  Discharge diagnosis:  Renal cancer  Secondary diagnoses:  Acute blood loss anemia  History and Physical: For full details, please see admission history and physical. Briefly, Carol [name] is a 59 y.o. year old patient with a LEFT renal mass. Please see prior H&P for full details.   Hospital Course:   Renal mass-She underwent uncomplicated LEFT partial nephrectomy 06/11/15. She tolerated the procedure well. She was transferred to the floor and her diet was slowly advanced. By day of discharge her pain was controlled on oral pain medications, she was voiding volitionally and tolerating a regular diet. She was passing flatus at time of discharge. Foley catheter and JP drain were removed prior to discharge.  Acute blood loss anemia-Her post-operative hemoglobin was 9.1 from pre-op of 13. She drifted down to 6.2 and back up to 7.3 on day of discharge. She remained asymptomatic and was not transfused blood.  Laboratory values:  Recent Labs  06/14/15 0615 06/14/15 0730 06/15/15 0507  HGB 6.2* 6.6* 7.3*  HCT 18.2* 19.5* 21.6*    Recent Labs  06/14/15 0615 06/15/15 0507  CREATININE 0.62 0.78    Disposition: Home  Discharge instruction: The patient was instructed to be ambulatory but told to refrain from heavy lifting, strenuous activity, or driving.   1- Drain Sites - You may have some mild persistent drainage from old drain site for several days, this is normal. This can be covered with cotton gauze for convenience.  2 - Stiches - Your stitches are all dissolvable. You may notice a "loose thread" at your incisions, these are normal and require no intervention. You may cut them flush to the skin with fingernail clippers if needed for comfort.  3 - Diet - No restrictions  4 - Activity - No heavy lifting / straining (any activities that require valsalva or "bearing down") x 4 weeks.  Otherwise, no restrictions.  5 - Bathing - You may shower immediately. Do not take a bath or get into swimming pool where incision sites are submersed in water x 4 weeks.   6 - When to Call the Doctor - Call MD for any fever >102, any acute wound problems, or any severe nausea / vomiting. You can call the [facility] [phone]) 24 hours a day 365 days a year. It will roll-over to the answering service and on-call physician after hours.  Discharge medications:    Medication List    STOP taking these medications        ciprofloxacin 500 MG tablet  Commonly known as:  CIPRO     ibuprofen 200 MG tablet  Commonly known as:  ADVIL,MOTRIN     metroNIDAZOLE 500 MG tablet  Commonly known as:  FLAGYL      TAKE these medications        atorvastatin 10 MG tablet  Commonly known as:  LIPITOR  Take 10 mg by mouth every morning.     furosemide 20 MG tablet  Commonly known as:  LASIX  Take 20 mg by mouth every morning.     glimepiride 2 MG tablet  Commonly known as:  AMARYL  Take 2 mg by mouth daily with breakfast.     HYDROcodone-acetaminophen 5-325 MG per tablet  Commonly known as:  NORCO/VICODIN  Take 1-2 tablets by mouth every 6 (six) hours as needed for moderate pain.     levothyroxine 75 MCG tablet  Commonly known as:  SYNTHROID, LEVOTHROID  Take 75 mcg by mouth daily before breakfast.     lisinopril 20 MG tablet  Commonly known as:  PRINIVIL,ZESTRIL  Take 10 mg by mouth every morning.     metFORMIN 1000 MG tablet  Commonly known as:  GLUCOPHAGE  Take 1,000 mg by mouth 2 (two) times daily with a meal.     PROAIR HFA 108 (90 BASE) MCG/ACT inhaler  Generic drug:  albuterol  2 puffs every 4 (four) hours as needed. for wheezing        Followup:      Follow-up Information    Follow up with Ardis Hughs, MD On 06/25/2015.   Specialty:  Urology   Why:  3pm   Contact information:   [place] [address] [phone]

## 2016-01-04 ENCOUNTER — Other Ambulatory Visit: Payer: Self-pay | Admitting: Urology

## 2016-01-04 ENCOUNTER — Ambulatory Visit (HOSPITAL_COMMUNITY)
Admission: RE | Admit: 2016-01-04 | Discharge: 2016-01-04 | Disposition: A | Discharge status: Home / Self Care | Payer: BLUE CROSS/BLUE SHIELD | Source: Ambulatory Visit | Attending: Urology | Admitting: Urology

## 2016-01-04 DIAGNOSIS — R918 Other nonspecific abnormal finding of lung field: Secondary | ICD-10-CM | POA: Diagnosis not present

## 2016-01-04 DIAGNOSIS — C642 Malignant neoplasm of left kidney, except renal pelvis: Secondary | ICD-10-CM | POA: Diagnosis present

## 2016-02-10 ENCOUNTER — Encounter: Payer: Self-pay | Admitting: Gastroenterology

## 2016-05-03 ENCOUNTER — Ambulatory Visit (INDEPENDENT_AMBULATORY_CARE_PROVIDER_SITE_OTHER): Payer: BLUE CROSS/BLUE SHIELD | Admitting: Internal Medicine

## 2016-05-03 ENCOUNTER — Encounter: Payer: Self-pay | Admitting: Internal Medicine

## 2016-05-03 VITALS — BP 113/70 | HR 70 | Ht 62.0 in | Wt 224.8 lb

## 2016-05-03 DIAGNOSIS — E785 Hyperlipidemia, unspecified: Secondary | ICD-10-CM

## 2016-05-03 DIAGNOSIS — G4733 Obstructive sleep apnea (adult) (pediatric): Secondary | ICD-10-CM

## 2016-05-03 DIAGNOSIS — E119 Type 2 diabetes mellitus without complications: Secondary | ICD-10-CM | POA: Diagnosis not present

## 2016-05-03 DIAGNOSIS — Z9989 Dependence on other enabling machines and devices: Secondary | ICD-10-CM | POA: Insufficient documentation

## 2016-05-03 NOTE — Patient Instructions (Addendum)
Your physician has recommended that you have a sleep study @ [facility]. This test records several body functions during sleep, including: brain activity, eye movement, oxygen and carbon dioxide blood levels, heart rate and rhythm, breathing rate and rhythm, the flow of air through your mouth and nose, snoring, body muscle movements, and chest and belly movement.  Your physician recommends that you schedule a follow-up appointment in 4-6 weeks with Dr. Debara Pickett (after sleep study)  Labs requested from PCP office

## 2016-05-03 NOTE — Progress Notes (Signed)
OFFICE NOTE  Chief Complaint:  Daytime fatigue  Primary Care Physician: Rachell Cipro, MD  HPI:  Carol [name] is a 60 y.o. female who I previously saw in 2012 for fatigue and she was ultimately diagnosed with obstructive sleep apnea. She was placed on CPAP however reports that her CPAP unit broke about a year ago and she's been without it. Last year was eventful for her, she underwent 2 surgeries for a kidney cancer as well as knee surgeries and one of her surgeries was complicated by Clostridium difficile colitis. During that time she lost a total of 43 pounds and currently weighs 224 pounds. This drop caused her hemoglobin A1c to go down from 7.4-6.3. Her blood pressure also normalized and she was taken off of lisinopril. Her Lasix was reduced to 10 mg from 20 mg. Despite these significant changes and increased exercise and activity, she feels better but still has some daytime fatigue and sleepiness. She feels that she is in need of her CPAP machine. Again her last study was more than 4 years ago. Currently she denies any chest pain or worsening shortness of breath with exertion. She recently had lab work from Dr. Ival Bible office which we will request.  PMHx:  Past Medical History  Diagnosis Date  . Allergy   . Thyroid disease   . Obesity   . Diabetes mellitus     oral agent control  . Arthritis     hands,knees,elbows  . Hypothyroidism   . Ankle edema   . Sleep apnea     cpap machine is broke   . Shortness of breath dyspnea     with exertion     Past Surgical History  Procedure Laterality Date  . Colonoscopy    . Polypectomy    . Rotator cuff repair Left 2011  . Cholecystectomy  2009  . Breast biopsy Right 2001  . Dilatation & currettage/hysteroscopy with resectocope N/A 01/21/2013    Procedure: DILATATION & CURETTAGE/HYSTEROSCOPY WITH RESECTOCOPE AND RESECTION OF ENDOMETRIAL;  Surgeon: Selinda Orion, MD;  [facility];  Service: Gynecology;   Laterality: N/A;  . Knee arthroscopy      left   . Robotic assited partial nephrectomy Left 06/11/2015    Procedure: LEFT ROBOTIC ASSISTED LAPAROSCOPIC  PARTIAL NEPHRECTOMY;  Surgeon: Ardis Hughs, MD;  Location: [facility];  Service: Urology;  Laterality: Left;    FAMHx:  Family History  Problem Relation Age of Onset  . Cancer Father     SOCHx:   reports that she has never smoked. She has never used smokeless tobacco. She reports that she does not drink alcohol or use illicit drugs.  ALLERGIES:  No Known Allergies  ROS: Pertinent items noted in HPI and remainder of comprehensive ROS otherwise negative.  HOME MEDS: Current Outpatient Prescriptions  Medication Sig Dispense Refill  . atorvastatin (LIPITOR) 10 MG tablet Take 10 mg by mouth every morning.    . Cholecalciferol (VITAMIN D3) 10000 units TABS Take 1 tablet by mouth daily.    . furosemide (LASIX) 20 MG tablet Take 10 mg by mouth every morning.     Marland Kitchen levothyroxine (SYNTHROID, LEVOTHROID) 75 MCG tablet Take 75 mcg by mouth daily before breakfast.    . metFORMIN (GLUCOPHAGE) 1000 MG tablet Take 1,000 mg by mouth 2 (two) times daily with a meal.    . PROAIR HFA 108 (90 BASE) MCG/ACT inhaler 2 puffs every 4 (four) hours as needed. for wheezing  3  No current facility-administered medications for this visit.    LABS/IMAGING: No results found for this or any previous visit (from the past 48 hour(s)). No results found.  WEIGHTS: Wt Readings from Last 3 Encounters:  05/03/16 224 lb 12.8 oz (101.969 kg)  06/11/15 261 lb 12.8 oz (118.752 kg)  06/01/15 261 lb 12.8 oz (118.752 kg)    VITALS: BP 113/70 mmHg  Pulse 70  Ht 5\' 2"  (1.575 m)  Wt 224 lb 12.8 oz (101.969 kg)  BMI 41.11 kg/m2  EXAM: General appearance: alert and no distress Neck: no carotid bruit, no JVD and thyroid not enlarged, symmetric, no tenderness/mass/nodules Lungs: clear to auscultation bilaterally Heart: regular rate and rhythm, S1, S2 normal,  no murmur, click, rub or gallop Abdomen: soft, non-tender; bowel sounds normal; no masses,  no organomegaly Extremities: extremities normal, atraumatic, no cyanosis or edema Pulses: 2+ and symmetric Skin: Skin color, texture, turgor normal. No rashes or lesions Neurologic: Grossly normal Psych: Pleasant  EKG: Normal sinus rhythm at 70  ASSESSMENT: 1. Morbid obesity 2. Obstructive sleep apnea 3. Type 2 diabetes 4. Dyslipidemia  PLAN: 1.   Carol [name] presents with persistent daytime fatigue and somnolence. She had previously been on CPAP for obstructive sleep apnea but her machine broke about a year ago. She would like a repeat study. As she's lost more than 43 pounds recently, her settings may be dramatically different. She may not even have obstructive sleep apnea. It would be important to restudy her. This will help [place] to get new equipment and titrated to her current settings. I'll refer her for sleep study and plan to see her back afterwards to discuss. Her diabetes and dyslipidemia managed by her primary care provider. We'll request her recent lipid testing. Her A1c has gone down to 6.3. She was previously on lisinopril for hypertension however that has improved as well.  Pixie Casino, MD, [facility] Attending Cardiologist Payne C Gilad Dugger 05/03/2016, 8:31 AM

## 2016-06-19 ENCOUNTER — Ambulatory Visit (HOSPITAL_BASED_OUTPATIENT_CLINIC_OR_DEPARTMENT_OTHER): Payer: BLUE CROSS/BLUE SHIELD | Attending: Internal Medicine | Admitting: Cardiovascular Disease

## 2016-06-19 VITALS — Ht 62.0 in | Wt 215.0 lb

## 2016-06-19 DIAGNOSIS — Z79899 Other long term (current) drug therapy: Secondary | ICD-10-CM | POA: Diagnosis not present

## 2016-06-19 DIAGNOSIS — Z7984 Long term (current) use of oral hypoglycemic drugs: Secondary | ICD-10-CM | POA: Diagnosis not present

## 2016-06-19 DIAGNOSIS — G4719 Other hypersomnia: Secondary | ICD-10-CM | POA: Insufficient documentation

## 2016-06-19 DIAGNOSIS — G4733 Obstructive sleep apnea (adult) (pediatric): Secondary | ICD-10-CM | POA: Diagnosis not present

## 2016-06-19 DIAGNOSIS — G4761 Periodic limb movement disorder: Secondary | ICD-10-CM | POA: Insufficient documentation

## 2016-06-19 DIAGNOSIS — Z9989 Dependence on other enabling machines and devices: Secondary | ICD-10-CM

## 2016-06-19 DIAGNOSIS — E119 Type 2 diabetes mellitus without complications: Secondary | ICD-10-CM | POA: Insufficient documentation

## 2016-06-19 DIAGNOSIS — R5383 Other fatigue: Secondary | ICD-10-CM | POA: Insufficient documentation

## 2016-06-19 DIAGNOSIS — Z6839 Body mass index (BMI) 39.0-39.9, adult: Secondary | ICD-10-CM | POA: Insufficient documentation

## 2016-06-19 DIAGNOSIS — E669 Obesity, unspecified: Secondary | ICD-10-CM | POA: Insufficient documentation

## 2016-06-19 DIAGNOSIS — R0683 Snoring: Secondary | ICD-10-CM | POA: Diagnosis not present

## 2016-06-21 ENCOUNTER — Encounter (HOSPITAL_BASED_OUTPATIENT_CLINIC_OR_DEPARTMENT_OTHER): Payer: Self-pay | Admitting: Cardiovascular Disease

## 2016-06-21 NOTE — Procedures (Signed)
Patient Name: Carol [name], Montone Date: 06/19/2016 Gender: Female D.O.B: 1956/05/08 Age (years): 59 Referring Provider: Nadean Corwin Hilty Height (inches): 62 Interpreting Physician: Shelva Majestic MD, ABSM Weight (lbs): 215 RPSGT: Jonna Coup BMI: 39 MRN: [id number] Neck Size: 17.00  CLINICAL INFORMATION Sleep Study Type: Split Night CPAP Indication for sleep study: Diabetes, Excessive Daytime Sleepiness, Fatigue, Obesity, OSA, Snoring Epworth Sleepiness Score: 11  SLEEP STUDY TECHNIQUE As per the AASM Manual for the Scoring of Sleep and Associated Events v2.3 (April 2016) with a hypopnea requiring 4% desaturations. The channels recorded and monitored were frontal, central and occipital EEG, electrooculogram (EOG), submentalis EMG (chin), nasal and oral airflow, thoracic and abdominal wall motion, anterior tibialis EMG, snore microphone, electrocardiogram, and pulse oximetry. Continuous positive airway pressure (CPAP) was initiated when the patient met split night criteria and was titrated according to treat sleep-disordered breathing.  MEDICATIONS atorvastatin (LIPITOR) 10 MG tablet Taking -- -- Historical Provider, MD Cholecalciferol (VITAMIN D3) 10000 units TABS Taking -- -- Historical Provider, MD Notes:  Received from: [place]: Take by mouth. furosemide (LASIX) 20 MG tablet Taking -- -- Historical Provider, MD levothyroxine (SYNTHROID, LEVOTHROID) 75 MCG tablet Taking -- -- Historical Provider, MD metFORMIN (GLUCOPHAGE) 1000 MG tablet Taking -- -- Historical Provider, MD PROAIR HFA 108 (90 BASE) MCG/ACT inhaler Taking  Medications administered by patient during sleep study : No sleep medicine administered.  RESPIRATORY PARAMETERS Diagnostic Total AHI (/hr): 19.0 RDI (/hr): 19.4 OA Index (/hr): 2.4 CA Index (/hr): 0.0 REM AHI (/hr): 55.7 NREM AHI (/hr): 1.7 Supine AHI (/hr): 30.5 Non-supine AHI (/hr): 12.07 Min O2 Sat (%): 77.00 Mean O2  (%): 94.18 Time below 88% (min): 7.5   Titration Optimal Pressure (cm): 10 AHI at Optimal Pressure (/hr): 0.0 Min O2 at Optimal Pressure (%): 94.0 Supine % at Optimal (%): 0 Sleep % at Optimal (%): 95    SLEEP ARCHITECTURE The recording time for the entire night was 362.7 minutes. During a baseline period of 161.1 minutes, the patient slept for 151.5 minutes in REM and nonREM, yielding a sleep efficiency of 94.0%. Sleep onset after lights out was 0.0 minutes with a REM latency of 24.5 minutes. The patient spent 2.31% of the night in stage N1 sleep, 65.68% in stage N2 sleep, 0.00% in stage N3 and 32.01% in REM.   During the titration period of 198.1 minutes, the patient slept for 172.0 minutes in REM and nonREM, yielding a sleep efficiency of 86.8%. Sleep onset after CPAP initiation was 13.9 minutes with a REM latency of 56.0 minutes. The patient spent 4.07% of the night in stage N1 sleep, 61.34% in stage N2 sleep, 0.00% in stage N3 and 34.59% in REM.  CARDIAC DATA The 2 lead EKG demonstrated sinus rhythm. The mean heart rate was 66.90 beats per minute. Other EKG findings include: None.  LEG MOVEMENT DATA The total Periodic Limb Movements of Sleep (PLMS) were 387. The PLMS index was 71.78 .  IMPRESSIONS - Moderate obstructive sleep apnea overall with an AHI of 19.0/hour, but sleep apnea was severe during REM sleep (AHI 55.7/hour).   An optimal PAP pressure was selected for this patient ( 10 cm of water) - No significant central sleep apnea occurred during the diagnostic portion of the study (CAI = 0.0/hour). - Significant  oxygen desaturation to a nadir of O2 = 77.00%. - The patient snored with Loud snoring volume during the diagnostic portion of the study. - No cardiac abnormalities were noted during this study. -  Severe periodic limb movements of sleep occurred during the study.  DIAGNOSIS - Obstructive Sleep Apnea (327.23 [G47.33 ICD-10]) -  Periodic Limb Movement Disorder of  sleep.  RECOMMENDATIONS - Recommend an initial trial of CPAP therapy with EPR/C-Flex at 10 cm H2O with heated humidification.  A small size Fisher&Paykel Full Face Mask Simplus mask was used for the titration. - Efforts should be made to optimize nasal and oropharyngeal patency. - Avoid alcohol, sedatives and other CNS depressants that may worsen sleep apnea and disrupt normal sleep architecture. - If patient is symptomatic with restless legs on CPAP therapy, consider a trial of pharmacotherapy. - Sleep hygiene should be reviewed to assess factors that may improve sleep quality. - Weight management (BMI 39) and regular exercise should be initiated or continued. - Recommend a download be obtained in 30 days and sleep clinic evaluation.  [Electronically signed] 06/21/2016 05:49 PM  Shelva Majestic MD, [facility], [place], American Board of Sleep Medicine   NPI: [id number] [place] PH: [phone]   FX: [phone] [place]

## 2016-06-27 ENCOUNTER — Telehealth: Payer: Self-pay | Admitting: *Deleted

## 2016-06-27 NOTE — Progress Notes (Signed)
Left message for patient to return a call to discuss sleep study.

## 2016-06-27 NOTE — Telephone Encounter (Signed)
Left message to return a call to discuss her sleep study. ?

## 2016-06-28 NOTE — Telephone Encounter (Signed)
F/u ° ° °Pt returning nurse call. Please call.  °

## 2016-06-29 NOTE — Telephone Encounter (Signed)
Informed patient of sleep study results and recommendations. Referral sent to [facility].

## 2016-06-30 ENCOUNTER — Telehealth: Payer: Self-pay | Admitting: Internal Medicine

## 2016-06-30 NOTE — Telephone Encounter (Signed)
Patient called with MD recommendations/advice on follow up. She states she will keep her appointment for now. She will call back if she decides to cancel.

## 2016-06-30 NOTE — Telephone Encounter (Signed)
-----   Message from Pixie Casino, MD sent at 06/30/2016  8:22 AM EDT ----- Dr. Claiborne Billings is only seeing for sleep apnea - I'm seeing her for overall prevention of cardiovascular disease. It's up to her.  Dr. Lemmie Evens  ----- Message ----- From: Fidel Levy, RN Sent: 06/30/2016   8:19 AM To: Pixie Casino, MD  Please see message from Mariann Laster.   ----- Message ----- From: Lauralee Evener, CMA Sent: 06/29/2016   5:39 PM To: Fidel Levy, RN  Eliezer Lofts I spoke with this patient about her sleep study results and recommendations. I told her that Dr Claiborne Billings will see her in 2-3 months. She wants to know why she has to keep the September appointment with Dr Debara Pickett if he is not going to manage her OSA. She said to please confirm what she needs to do and call her.  Thanks,   Mariann Laster

## 2016-07-14 ENCOUNTER — Ambulatory Visit (HOSPITAL_COMMUNITY)
Admission: RE | Admit: 2016-07-14 | Discharge: 2016-07-14 | Disposition: A | Discharge status: Home / Self Care | Payer: BLUE CROSS/BLUE SHIELD | Source: Ambulatory Visit | Attending: Urology | Admitting: Urology

## 2016-07-14 ENCOUNTER — Other Ambulatory Visit: Payer: Self-pay | Admitting: Urology

## 2016-07-14 DIAGNOSIS — C642 Malignant neoplasm of left kidney, except renal pelvis: Secondary | ICD-10-CM

## 2016-07-14 DIAGNOSIS — R918 Other nonspecific abnormal finding of lung field: Secondary | ICD-10-CM | POA: Insufficient documentation

## 2016-07-25 ENCOUNTER — Ambulatory Visit (INDEPENDENT_AMBULATORY_CARE_PROVIDER_SITE_OTHER): Payer: BLUE CROSS/BLUE SHIELD | Admitting: Internal Medicine

## 2016-07-25 ENCOUNTER — Encounter: Payer: Self-pay | Admitting: Internal Medicine

## 2016-07-25 VITALS — BP 115/76 | HR 75 | Ht 62.5 in | Wt 227.6 lb

## 2016-07-25 DIAGNOSIS — G4733 Obstructive sleep apnea (adult) (pediatric): Secondary | ICD-10-CM | POA: Diagnosis not present

## 2016-07-25 DIAGNOSIS — E785 Hyperlipidemia, unspecified: Secondary | ICD-10-CM

## 2016-07-25 DIAGNOSIS — Z9989 Dependence on other enabling machines and devices: Secondary | ICD-10-CM

## 2016-07-25 NOTE — Progress Notes (Signed)
OFFICE NOTE  Chief Complaint:  Routine follow-up  Primary Care Physician: Barrie Lyme, FNP  HPI:  Carol [name] is a 60 y.o. female who I previously saw in 2012 for fatigue and she was ultimately diagnosed with obstructive sleep apnea. She was placed on CPAP however reports that her CPAP unit broke about a year ago and she's been without it. Last year was eventful for her, she underwent 2 surgeries for a kidney cancer as well as knee surgeries and one of her surgeries was complicated by Clostridium difficile colitis. During that time she lost a total of 43 pounds and currently weighs 224 pounds. This drop caused her hemoglobin A1c to go down from 7.4-6.3. Her blood pressure also normalized and she was taken off of lisinopril. Her Lasix was reduced to 10 mg from 20 mg. Despite these significant changes and increased exercise and activity, she feels better but still has some daytime fatigue and sleepiness. She feels that she is in need of her CPAP machine. Again her last study was more than 4 years ago. Currently she denies any chest pain or worsening shortness of breath with exertion. She recently had lab work from Dr. Ival Bible office which we will request.  07/25/2016  [name] returns today for follow-up. When I last saw her she was referred for repeat sleep study which confirmed obstructive sleep apnea. She was placed on CPAP at 10 cm water. She reports that she sleeps fairly well with this and likes her device although does have some trouble falling asleep. Recently she switched providers to [name], [name]. She had surveillance CT for kidney cancer which came out well. Fortunately she has continued to do well with weight loss and has become nondiabetic by A1c. Her metformin was discontinued. She is now off of lisinopril. She is on low-dose atorvastatin  She denies any chest pain or worsening shortness of breath with exertion. She reports doing water aerobics a few times a week because of knee  problems her exercises otherwise limited.  PMHx:  Past Medical History:  Diagnosis Date  . Allergy   . Ankle edema   . Arthritis    hands,knees,elbows  . Diabetes mellitus    oral agent control  . Hypothyroidism   . Obesity   . Shortness of breath dyspnea    with exertion   . Sleep apnea    cpap machine is broke   . Thyroid disease     Past Surgical History:  Procedure Laterality Date  . BREAST BIOPSY Right 2001  . CHOLECYSTECTOMY  2009  . COLONOSCOPY    . DILATATION & CURRETTAGE/HYSTEROSCOPY WITH RESECTOCOPE N/A 01/21/2013   Procedure: DILATATION & CURETTAGE/HYSTEROSCOPY WITH RESECTOCOPE AND RESECTION OF ENDOMETRIAL;  Surgeon: Selinda Orion, MD;  Location[facility];  Service: Gynecology;  Laterality: N/A;  . KNEE ARTHROSCOPY     left   . POLYPECTOMY    . ROBOTIC ASSITED PARTIAL NEPHRECTOMY Left 06/11/2015   Procedure: LEFT ROBOTIC ASSISTED LAPAROSCOPIC  PARTIAL NEPHRECTOMY;  Surgeon: Ardis Hughs, MD;  Location: [facility];  Service: Urology;  Laterality: Left;  . ROTATOR CUFF REPAIR Left 2011    FAMHx:  Family History  Problem Relation Age of Onset  . Cancer Father     SOCHx:   reports that she has never smoked. She has never used smokeless tobacco. She reports that she does not drink alcohol or use drugs.  ALLERGIES:  No Known Allergies  ROS: Pertinent items noted in  HPI and remainder of comprehensive ROS otherwise negative.  HOME MEDS: Current Outpatient Prescriptions  Medication Sig Dispense Refill  . atorvastatin (LIPITOR) 10 MG tablet Take 10 mg by mouth every morning.    . Cholecalciferol (VITAMIN D3) 10000 units TABS Take 1 tablet by mouth daily.    . furosemide (LASIX) 20 MG tablet Take 10 mg by mouth every morning.     Marland Kitchen levothyroxine (SYNTHROID, LEVOTHROID) 75 MCG tablet Take 75 mcg by mouth daily before breakfast.    . PROAIR HFA 108 (90 BASE) MCG/ACT inhaler 2 puffs every 4 (four) hours as needed. for wheezing  3   No current  facility-administered medications for this visit.     LABS/IMAGING: No results found for this or any previous visit (from the past 48 hour(s)). No results found.  WEIGHTS: Wt Readings from Last 3 Encounters:  07/25/16 227 lb 9.6 oz (103.2 kg)  06/19/16 215 lb (97.5 kg)  05/03/16 224 lb 12.8 oz (102 kg)    VITALS: BP 115/76   Pulse 75   Ht 5' 2.5" (1.588 m)   Wt 227 lb 9.6 oz (103.2 kg)   BMI 40.97 kg/m   EXAM: General appearance: alert and no distress Neck: no carotid bruit, no JVD and thyroid not enlarged, symmetric, no tenderness/mass/nodules Lungs: clear to auscultation bilaterally Heart: regular rate and rhythm, S1, S2 normal, no murmur, click, rub or gallop Abdomen: soft, non-tender; bowel sounds normal; no masses,  no organomegaly Extremities: extremities normal, atraumatic, no cyanosis or edema Pulses: 2+ and symmetric Skin: Skin color, texture, turgor normal. No rashes or lesions Neurologic: Grossly normal Psych: Pleasant  EKG:  Normal sinus rhythm at 75  ASSESSMENT: 1. Morbid obesity 2. Obstructive sleep apnea 3. Type 2 diabetes - resolved with weight loss and diet changes 4. Dyslipidemia  PLAN: 1.   [name] has had improvement in her type 2 diabetes and is now off of medications. She still is on a low-dose cholesterol medicine. She uses CPAP for obstructive sleep apnea and has a follow-up appointment with Dr. Claiborne Billings in the sleep clinic in November. She may need some mild titration of her device. I've encouraged her to continue activity and weight loss. Plan follow-up with me annually or sooner as necessary.  Pixie Casino, MD, [facility] Attending Cardiologist [place] 07/25/2016, 8:27 AM

## 2016-07-25 NOTE — Patient Instructions (Signed)
Your physician wants you to follow-up in: ONE YEAR with Dr. Hilty. You will receive a reminder letter in the mail two months in advance. If you don't receive a letter, please call our office to schedule the follow-up appointment.  

## 2016-10-09 ENCOUNTER — Encounter: Payer: Self-pay | Admitting: Cardiovascular Disease

## 2016-10-09 ENCOUNTER — Ambulatory Visit (INDEPENDENT_AMBULATORY_CARE_PROVIDER_SITE_OTHER): Payer: BLUE CROSS/BLUE SHIELD | Admitting: Cardiovascular Disease

## 2016-10-09 VITALS — BP 119/76 | HR 84 | Ht 62.0 in | Wt 226.6 lb

## 2016-10-09 DIAGNOSIS — G4733 Obstructive sleep apnea (adult) (pediatric): Secondary | ICD-10-CM | POA: Diagnosis not present

## 2016-10-09 DIAGNOSIS — E119 Type 2 diabetes mellitus without complications: Secondary | ICD-10-CM

## 2016-10-09 MED ORDER — ROPINIROLE HCL 0.5 MG PO TABS: ORAL_TABLET | ORAL | 6 refills | Status: DC

## 2016-10-09 NOTE — Patient Instructions (Addendum)
Your physician wants you to follow-up in: 1 year or sooner if needed in a office sleep clinic. You will receive a reminder letter in the mail two months in advance. If you don't receive a letter, please call our office to schedule the follow-up appointment.   Start new prescription given today for Ropinirole as directed on the bottle.

## 2016-10-11 NOTE — Progress Notes (Addendum)
Date:  10/11/2016   ID:  Carol [name], DOB 04/12/1956, MRN [id number]  PCP:  Barrie Lyme, FNP  Cardiologist:  Shelva Majestic, MD (sleep);  Dr. Debara Pickett  Sleep Clinic Evaluation  HPI: Carol [name], is a 60 y.o. female presents for a sleep clinic evaluation following initiation of CPAP therapy.  [name] has remote history of some obstructive sleep apnea and had been on CPAP therapy until her CPAP.  She was no longer functional.  She has a history of morbid obesity as well as type 2 diabetes mellitus and hyperlipidemia.  Is referred for a new seat sleep evaluation which was done on 06/19/2016.  This confirmed moderate obstructive sleep apnea with an overall AHI of 19.0 per hour.  However, her sleep apnea was severe doing ROM sleep with an HI of 55.7/h.  Begin oxygen desaturation to a nadir of 77%.  There was evidence for loud snoring and she also had severe periodic lipid disorder 387 limb movements with an index of 71.78.  She ultimately underwent CPAP titration trial and 10 cm water pressure was recommended.  She has a new ResMed AirSense 10 AutoSet  CPAP unit and has a ResMed air fifth of 20 small size mask.  Her set up date was 07/11/2016 and she uses [employer] for her DME company.  Typically she goes to bed between 8 and 8:30 PM but wakes up at 4 AM.  She works at [employer] and typically is at work at 6 AM.  A download was obtained from October 9 through 09/26/2016.  She is meeting compliance standards with usage stays at 90% and usage greater than 4 hours at 83%.  She is averaging 6 hours and 48 minutes of sleep per night.  At a 10 cm set water pressure, AHI is 2.8.  Initially, when she had a small mask.  She had more leak and recently has been using a medium size mask with less leak.  She feels significantly improved.  She is unaware of breakthrough snoring.  She denies residual daytime sleepiness.  However, she is symptomatic with restless legs.  She has the urge to move with  painful legs.  She presents for evaluation.   Epworth Sleepiness Scale: Situation   Chance of Dozing/Sleeping (0 = never , 1 = slight chance , 2 = moderate chance , 3 = high chance )   sitting and reading 2   watching TV 2   sitting inactive in a public place 0   being a passenger in a motor vehicle for an hour or more 1   lying down in the afternoon 3   sitting and talking to someone 0   sitting quietly after lunch (no alcohol) 2   while stopped for a few minutes in traffic as the driver 0   Total Score  10    Past Medical History:  Diagnosis Date  . Allergy   . Ankle edema   . Arthritis    hands,knees,elbows  . Diabetes mellitus    oral agent control  . Hypothyroidism   . Obesity   . Shortness of breath dyspnea    with exertion   . Sleep apnea    cpap machine is broke   . Thyroid disease     Past Surgical History:  Procedure Laterality Date  . BREAST BIOPSY Right 2001  . CHOLECYSTECTOMY  2009  . COLONOSCOPY    . DILATATION & CURRETTAGE/HYSTEROSCOPY WITH RESECTOCOPE N/A 01/21/2013  Procedure: DILATATION & CURETTAGE/HYSTEROSCOPY WITH RESECTOCOPE AND RESECTION OF ENDOMETRIAL;  Surgeon: Selinda Orion, MD;  Location:  [facility];  Service: Gynecology;  Laterality: N/A;  . KNEE ARTHROSCOPY     left   . POLYPECTOMY    . ROBOTIC ASSITED PARTIAL NEPHRECTOMY Left 06/11/2015   Procedure: LEFT ROBOTIC ASSISTED LAPAROSCOPIC  PARTIAL NEPHRECTOMY;  Surgeon: Ardis Hughs, MD;  Location: [facility];  Service: Urology;  Laterality: Left;  . ROTATOR CUFF REPAIR Left 2011    No Known Allergies  Current Outpatient Prescriptions  Medication Sig Dispense Refill  . atorvastatin (LIPITOR) 10 MG tablet Take 10 mg by mouth every morning.    . Cholecalciferol (VITAMIN D3) 10000 units TABS Take 1 tablet by mouth daily.    . furosemide (LASIX) 20 MG tablet Take 10 mg by mouth every morning.     Marland Kitchen levothyroxine (SYNTHROID, LEVOTHROID) 75 MCG tablet Take 75 mcg by mouth  daily before breakfast.    . PROAIR HFA 108 (90 BASE) MCG/ACT inhaler 2 puffs every 4 (four) hours as needed. for wheezing  3  . rOPINIRole (REQUIP) 0.5 MG tablet Take 1/2 tablet for 2 nights, then increase to 1 tablet nightly for 1 week. May increase to 2 tablets nightly if needed. 60 tablet 6   No current facility-administered medications for this visit.     Social History   Social History  . Marital status: Married    Spouse name: N/A  . Number of children: N/A  . Years of education: N/A   Occupational History  . Not on file.   Social History Main Topics  . Smoking status: Never Smoker  . Smokeless tobacco: Never Used  . Alcohol use No  . Drug use: No  . Sexual activity: Not on file   Other Topics Concern  . Not on file   Social History Narrative  . No narrative on file    Family History  Problem Relation Age of Onset  . Cancer Father      ROS General: Negative; No fevers, chills, or night sweats HEENT: Negative; No changes in vision or hearing, sinus congestion, difficulty swallowing Pulmonary: Negative; No cough, wheezing, shortness of breath, hemoptysis Cardiovascular: Negative; No chest pain, presyncope, syncope, palpatations GI: Negative; No nausea, vomiting, diarrhea, or abdominal pain GU: Negative; No dysuria, hematuria, or difficulty voiding Musculoskeletal: Negative; no myalgias, joint pain, or weakness Hematologic: Negative; no easy bruising, bleeding Endocrine: Negative; no heat/cold intolerance Neuro: Negative; no changes in balance, headaches Skin: Negative; No rashes or skin lesions Psychiatric: Negative; No behavioral problems, depression Sleep: Positive for OSA and restless legs.  No daytime sleepiness, hypersomnolence, bruxism, rhypnogognic hallucinations, no cataplexy   Physical Exam BP 119/76   Pulse 84   Ht '5\' 2"'  (1.575 m)   Wt 226 lb 9.6 oz (102.8 kg)   SpO2 99%   BMI 41.45 kg/m    BMI is compatible with mild obesity. Wt Readings  from Last 3 Encounters:  10/09/16 226 lb 9.6 oz (102.8 kg)  07/25/16 227 lb 9.6 oz (103.2 kg)  06/19/16 215 lb (97.5 kg)   General: Alert, oriented, no distress.  Skin: normal turgor, no rashes HEENT: Normocephalic, atraumatic. Pupils round and reactive; sclera anicteric; extraocular muscles intact; Fundi ** Nose without nasal septal hypertrophy Mouth/Parynx benign; Mallinpatti scale.  Disks flat, no hemorrhages or exudates. Neck: No JVD, no carotid bruits Lungs: clear to ausculatation and percussion; no wheezing or rales  Chest wall: No tenderness to palpation Heart: RRR,  s1 s2 normal; faint 1/6 systolic murmur.  No S3 gallop. No rubs thrills or heaves. Abdomen: soft, nontender; no hepatosplenomehaly, BS+; abdominal aorta nontender and not dilated by palpation. Back: No CVA tenderness Pulses 2+ Extremities: no clubbinbg cyanosis or edema, Homan's sign negative  Neurologic: grossly nonfocal; cranial nerves intact. Psychological: Normal affect and mood.  ECG was not done today, but her last ECG on 07/25/2016 was independently reviewed and shows normal sinus rhythm at 75 bpm.  LABS:  BMP Latest Ref Rng & Units 06/15/2015 06/14/2015 06/12/2015  Glucose 65 - 99 mg/dL 184(H) 165(H) 250(H)  BUN 6 - 20 mg/dL '7 6 14  ' Creatinine 0.44 - 1.00 mg/dL 0.78 0.62 1.01(H)  Sodium 135 - 145 mmol/L 141 140 137  Potassium 3.5 - 5.1 mmol/L 3.8 3.3(L) 4.8  Chloride 101 - 111 mmol/L 108 112(H) 104  CO2 22 - 32 mmol/L '24 24 26  ' Calcium 8.9 - 10.3 mg/dL 8.2(L) 7.4(L) 8.0(L)     Hepatic Function Latest Ref Rng & Units 06/01/2015 02/20/2015 11/30/2007  Total Protein 6.5 - 8.1 g/dL 7.5 7.3 5.5(L)  Albumin 3.5 - 5.0 g/dL 4.4 4.0 2.2(L)  AST 15 - 41 U/L '27 30 24  ' ALT 14 - 54 U/L 35 36(H) 34  Alk Phosphatase 38 - 126 U/L 86 102 92  Total Bilirubin 0.3 - 1.2 mg/dL 0.8 1.2 0.7  Bilirubin, Direct 0.0 - 0.5 mg/dL - 0.2 -     CBC Latest Ref Rng & Units 06/15/2015 06/14/2015 06/14/2015  WBC 4.0 - 10.5 K/uL - - -    Hemoglobin 12.0 - 15.0 g/dL 7.3(L) 6.6(LL) 6.2(LL)  Hematocrit 36.0 - 46.0 % 21.6(L) 19.5(L) 18.2(L)  Platelets 150 - 400 K/uL - - -     Lipid Panel  No results found for: CHOL, TRIG, HDL, CHOLHDL, VLDL, LDLCALC, LDLDIRECT   RADIOLOGY: No results found.    ASSESSMENT AND PLAN: Ms. Chavon Lucarelli is a 60 year old female who has a history of morbid obesity with a BMI of 41.45, type 2 diabetes mellitus, hyperlipidemia, and on sleep study was found to have moderate sleep apnea.  Overall, within HI of 19.0 per hour; however, sleep apnea was very severe during grams sleep with an AHI of 55.7 per hour.  She had significant nocturnal oxygen desaturation to 77%.  In addition, she had severe periodic limb movement disorder of sleep.  Her download straights that she is meeting compliance standards for Medicare standpoint.  Her AHI is excellent at 2.8.  She does have an intermittent leak and this seems to be occurring on the days when she was using a small size mask and she may need a medium-size mask for optimal treatment.  Reviewed with her the importance of continuation of therapy and have stressed sleep duration of at least 7 to almost 8 hours.  Her AHI is very severe and  In REM sleep and will be extremely point for her to continue using treatment all night since the preponderance of from sleep occurs in the second half of night.  She is symptomatic with reference to restless legs.  As result, I will initiate requip and will start her at 0.25 mg for 2 days taken 1-3 hours before sleep.  She will then titrate this to 0.5  mg for one week.  If she is still having symptoms she will then increase her dose to 1 mg to take 1-3 hours prior to bedtime.  With her morbid obesity, weight loss, increased exercise was recommended.  Controlled.  She will follow-up with Dr. Debara Pickett for primary cardiology care.  As long as she is stable, I will see her in one year for sleep reevaluation.     Troy Sine, MD, [facility]   10/11/2016 10:13 PM

## 2016-12-22 ENCOUNTER — Other Ambulatory Visit: Payer: Self-pay

## 2017-01-22 ENCOUNTER — Other Ambulatory Visit: Payer: Self-pay | Admitting: Urology

## 2017-01-22 ENCOUNTER — Ambulatory Visit (HOSPITAL_COMMUNITY)
Admission: RE | Admit: 2017-01-22 | Discharge: 2017-01-22 | Disposition: A | Discharge status: Home / Self Care | Payer: BLUE CROSS/BLUE SHIELD | Source: Ambulatory Visit | Attending: Urology | Admitting: Urology

## 2017-01-22 DIAGNOSIS — C642 Malignant neoplasm of left kidney, except renal pelvis: Secondary | ICD-10-CM | POA: Insufficient documentation

## 2017-02-12 ENCOUNTER — Other Ambulatory Visit: Payer: Self-pay | Admitting: Urology

## 2017-02-12 ENCOUNTER — Other Ambulatory Visit (HOSPITAL_COMMUNITY): Payer: Self-pay | Admitting: *Deleted

## 2017-02-12 NOTE — Progress Notes (Signed)
CHEST XRAY 01-22-17 EPIC  EKG 07-25-16 EPIC

## 2017-02-12 NOTE — Patient Instructions (Addendum)
LAGINA READER  02/12/2017   Your procedure is scheduled on: 02-14-17  Report to [facility]  Entrance take [facility]  elevators to 3rd floor to  [place] at 1015 AM.  Call this number if you have problems the morning of surgery [phone]   Remember: ONLY 1 PERSON MAY GO WITH YOU TO SHORT STAY TO GET  READY MORNING OF [place].  Do not eat food or drink liquids :After Midnight.     Take these medicines the morning of surgery with A SIP OF WATER: LEVOTHYROXINE (SYNTHROID), PROAIR INHALER PRN AND BRING                                 You may not have any metal on your body including hair pins and              piercings  Do not wear jewelry, make-up, lotions, powders or perfumes, deodorant             Do not wear nail polish.  Do not shave  48 hours prior to surgery.              Men may shave face and neck.   Do not bring valuables to the hospital. [place].  Contacts, dentures or bridgework may not be worn into surgery.  Leave suitcase in the car. After surgery it may be brought to your room.     Patients discharged the day of surgery will not be allowed to drive home.  Name and phone number of your driver:spouse joseph cell [phone]  Special Instructions: N/A              Please read over the following fact sheets you were given: _____________________________________________________________________     Do not shave (including legs and underarms) for at least 48 hours prior to the first CHG shower.  You may shave your face/neck. Please follow these instructions carefully:  1.  Shower with CHG Soap the night before surgery and the  morning of Surgery.  2.  If you choose to wash your hair, wash your hair first as usual with your  normal  shampoo.  3.  After you shampoo, rinse your hair and body thoroughly to remove the  shampoo.                            4.  Use CHG as you would any other liquid soap.  You can apply chg directly  to the skin and wash                       Gently with a scrungie or clean washcloth.  5.  Apply the CHG Soap to your body ONLY FROM THE NECK DOWN.   Do not use on face/ open                           Wound or open  sores. Avoid contact with eyes, ears mouth and genitals (private parts).                       Wash face,  Genitals (private parts) with your normal soap.             6.  Wash thoroughly, paying special attention to the area where your surgery  will be performed.  7.  Thoroughly rinse your body with warm water from the neck down.  8.  DO NOT shower/wash with your normal soap after using and rinsing off  the CHG Soap.                9.  Pat yourself dry with a clean towel.            10.  Wear clean pajamas.            11.  Place clean sheets on your bed the night of your first shower and do not  sleep with pets. Day of Surgery : Do not apply any lotions/deodorants the morning of surgery.  Please wear clean clothes to the hospital/surgery center.   PATIENT SIGNATURE___________________  NURSE SIGNATURE_______________________

## 2017-02-13 ENCOUNTER — Encounter (HOSPITAL_COMMUNITY): Payer: Self-pay

## 2017-02-13 ENCOUNTER — Encounter (HOSPITAL_COMMUNITY)
Admission: RE | Admit: 2017-02-13 | Discharge: 2017-02-13 | Disposition: A | Discharge status: Home / Self Care | Payer: BLUE CROSS/BLUE SHIELD | Source: Ambulatory Visit | Attending: Urology | Admitting: Urology

## 2017-02-13 DIAGNOSIS — Z79899 Other long term (current) drug therapy: Secondary | ICD-10-CM | POA: Diagnosis not present

## 2017-02-13 DIAGNOSIS — N135 Crossing vessel and stricture of ureter without hydronephrosis: Secondary | ICD-10-CM | POA: Diagnosis present

## 2017-02-13 DIAGNOSIS — Z905 Acquired absence of kidney: Secondary | ICD-10-CM | POA: Diagnosis not present

## 2017-02-13 DIAGNOSIS — Z6841 Body Mass Index (BMI) 40.0 and over, adult: Secondary | ICD-10-CM | POA: Diagnosis not present

## 2017-02-13 DIAGNOSIS — Z8052 Family history of malignant neoplasm of bladder: Secondary | ICD-10-CM | POA: Diagnosis not present

## 2017-02-13 DIAGNOSIS — G4730 Sleep apnea, unspecified: Secondary | ICD-10-CM | POA: Diagnosis not present

## 2017-02-13 DIAGNOSIS — N132 Hydronephrosis with renal and ureteral calculous obstruction: Secondary | ICD-10-CM | POA: Diagnosis not present

## 2017-02-13 DIAGNOSIS — Z85528 Personal history of other malignant neoplasm of kidney: Secondary | ICD-10-CM | POA: Diagnosis not present

## 2017-02-13 DIAGNOSIS — E119 Type 2 diabetes mellitus without complications: Secondary | ICD-10-CM | POA: Diagnosis not present

## 2017-02-13 HISTORY — DX: Malignant (primary) neoplasm, unspecified: C80.1

## 2017-02-13 HISTORY — DX: Dermatitis, unspecified: L30.9

## 2017-02-13 HISTORY — DX: Personal history of urinary calculi: Z87.442

## 2017-02-13 LAB — CBC
HCT: 35.6 % — ABNORMAL LOW (ref 36.0–46.0)
Hemoglobin: 12.6 g/dL (ref 12.0–15.0)
MCH: 30.7 pg (ref 26.0–34.0)
MCHC: 35.4 g/dL (ref 30.0–36.0)
MCV: 86.8 fL (ref 78.0–100.0)
PLATELETS: 181 10*3/uL (ref 150–400)
RBC: 4.1 MIL/uL (ref 3.87–5.11)
RDW: 13.4 % (ref 11.5–15.5)
WBC: 7.7 10*3/uL (ref 4.0–10.5)

## 2017-02-13 LAB — BASIC METABOLIC PANEL
ANION GAP: 7 (ref 5–15)
BUN: 26 mg/dL — AB (ref 6–20)
CO2: 27 mmol/L (ref 22–32)
CREATININE: 1.19 mg/dL — AB (ref 0.44–1.00)
Calcium: 9.4 mg/dL (ref 8.9–10.3)
Chloride: 104 mmol/L (ref 101–111)
GFR calc Af Amer: 56 mL/min — ABNORMAL LOW (ref >60)
GFR, EST NON AFRICAN AMERICAN: 48 mL/min — AB (ref >60)
GLUCOSE: 120 mg/dL — AB (ref 65–99)
Potassium: 4.4 mmol/L (ref 3.5–5.1)
Sodium: 138 mmol/L (ref 135–145)

## 2017-02-13 LAB — GLUCOSE, CAPILLARY: GLUCOSE-CAPILLARY: 129 mg/dL — AB (ref 65–99)

## 2017-02-13 NOTE — Progress Notes (Signed)
BMET RESULTS ROUTED TO DR [place]

## 2017-02-14 ENCOUNTER — Encounter (HOSPITAL_COMMUNITY): Payer: Self-pay | Admitting: *Deleted

## 2017-02-14 ENCOUNTER — Ambulatory Visit (HOSPITAL_COMMUNITY)
Admission: RE | Admit: 2017-02-14 | Discharge: 2017-02-14 | Disposition: A | Discharge status: Home / Self Care | Payer: BLUE CROSS/BLUE SHIELD | Source: Ambulatory Visit | Attending: Urology | Admitting: Urology

## 2017-02-14 ENCOUNTER — Encounter (HOSPITAL_COMMUNITY)
Admission: RE | Disposition: A | Discharge status: Home / Self Care | Payer: Self-pay | Source: Ambulatory Visit | Attending: Urology

## 2017-02-14 ENCOUNTER — Ambulatory Visit (HOSPITAL_COMMUNITY): Payer: BLUE CROSS/BLUE SHIELD

## 2017-02-14 ENCOUNTER — Ambulatory Visit (HOSPITAL_COMMUNITY): Payer: BLUE CROSS/BLUE SHIELD | Admitting: Anesthesiology

## 2017-02-14 DIAGNOSIS — Z905 Acquired absence of kidney: Secondary | ICD-10-CM | POA: Insufficient documentation

## 2017-02-14 DIAGNOSIS — E119 Type 2 diabetes mellitus without complications: Secondary | ICD-10-CM | POA: Insufficient documentation

## 2017-02-14 DIAGNOSIS — Z8052 Family history of malignant neoplasm of bladder: Secondary | ICD-10-CM | POA: Insufficient documentation

## 2017-02-14 DIAGNOSIS — N132 Hydronephrosis with renal and ureteral calculous obstruction: Secondary | ICD-10-CM | POA: Insufficient documentation

## 2017-02-14 DIAGNOSIS — Z6841 Body Mass Index (BMI) 40.0 and over, adult: Secondary | ICD-10-CM | POA: Insufficient documentation

## 2017-02-14 DIAGNOSIS — Z85528 Personal history of other malignant neoplasm of kidney: Secondary | ICD-10-CM | POA: Insufficient documentation

## 2017-02-14 DIAGNOSIS — G4730 Sleep apnea, unspecified: Secondary | ICD-10-CM | POA: Insufficient documentation

## 2017-02-14 DIAGNOSIS — Z79899 Other long term (current) drug therapy: Secondary | ICD-10-CM | POA: Insufficient documentation

## 2017-02-14 HISTORY — PX: URETEROSCOPY WITH HOLMIUM LASER LITHOTRIPSY: SHX6645

## 2017-02-14 HISTORY — PX: URETERAL BIOPSY: SHX6688

## 2017-02-14 LAB — HEMOGLOBIN A1C
Hgb A1c MFr Bld: 5.3 % (ref 4.8–5.6)
Mean Plasma Glucose: 105 mg/dL

## 2017-02-14 LAB — GLUCOSE, CAPILLARY
GLUCOSE-CAPILLARY: 108 mg/dL — AB (ref 65–99)
GLUCOSE-CAPILLARY: 124 mg/dL — AB (ref 65–99)

## 2017-02-14 SURGERY — URETEROSCOPY, WITH LITHOTRIPSY USING HOLMIUM LASER
Anesthesia: General | Site: Ureter | Laterality: Left

## 2017-02-14 MED ORDER — OXYCODONE HCL 5 MG PO TABS: 5.0000 mg | ORAL_TABLET | Freq: Once | ORAL | Status: DC | PRN

## 2017-02-14 MED ORDER — TRAMADOL HCL 50 MG PO TABS: 50.0000 mg | ORAL_TABLET | Freq: Four times a day (QID) | ORAL | 0 refills | Status: DC | PRN

## 2017-02-14 MED ORDER — PHENAZOPYRIDINE HCL 200 MG PO TABS
ORAL_TABLET | ORAL | Status: AC
  Filled 2017-02-14: qty 1

## 2017-02-14 MED ORDER — ONDANSETRON HCL 4 MG/2ML IJ SOLN
INTRAMUSCULAR | Status: AC
  Filled 2017-02-14: qty 2

## 2017-02-14 MED ORDER — CIPROFLOXACIN IN D5W 400 MG/200ML IV SOLN
400.0000 mg | Freq: Once | INTRAVENOUS | Status: AC
  Administered 2017-02-14: 400 mg via INTRAVENOUS
  Filled 2017-02-14: qty 200

## 2017-02-14 MED ORDER — ONDANSETRON HCL 4 MG/2ML IJ SOLN
INTRAMUSCULAR | Status: DC | PRN
  Administered 2017-02-14: 4 mg via INTRAVENOUS

## 2017-02-14 MED ORDER — SODIUM CHLORIDE 0.9 % IV SOLN
INTRAVENOUS | Status: DC | PRN
  Administered 2017-02-14: 50 mL

## 2017-02-14 MED ORDER — SODIUM CHLORIDE 0.9 % IR SOLN
Status: DC | PRN
  Administered 2017-02-14 (×3): 1000 mL via INTRAVESICAL

## 2017-02-14 MED ORDER — DEXAMETHASONE SODIUM PHOSPHATE 10 MG/ML IJ SOLN
INTRAMUSCULAR | Status: AC
  Filled 2017-02-14: qty 1

## 2017-02-14 MED ORDER — LIDOCAINE 2% (20 MG/ML) 5 ML SYRINGE
INTRAMUSCULAR | Status: AC
  Filled 2017-02-14: qty 5

## 2017-02-14 MED ORDER — LIDOCAINE HCL 2 % EX GEL
CUTANEOUS | Status: AC
  Filled 2017-02-14: qty 5

## 2017-02-14 MED ORDER — SUGAMMADEX SODIUM 200 MG/2ML IV SOLN
INTRAVENOUS | Status: DC | PRN
  Administered 2017-02-14: 200 mg via INTRAVENOUS

## 2017-02-14 MED ORDER — PHENAZOPYRIDINE HCL 200 MG PO TABS: 200.0000 mg | ORAL_TABLET | Freq: Three times a day (TID) | ORAL | 0 refills | Status: DC | PRN

## 2017-02-14 MED ORDER — ROCURONIUM BROMIDE 100 MG/10ML IV SOLN
INTRAVENOUS | Status: DC | PRN
  Administered 2017-02-14: 50 mg via INTRAVENOUS
  Administered 2017-02-14: 10 mg via INTRAVENOUS

## 2017-02-14 MED ORDER — PHENAZOPYRIDINE HCL 200 MG PO TABS
200.0000 mg | ORAL_TABLET | Freq: Once | ORAL | Status: AC | PRN
  Administered 2017-02-14: 200 mg via ORAL

## 2017-02-14 MED ORDER — BELLADONNA ALKALOIDS-OPIUM 16.2-60 MG RE SUPP
RECTAL | Status: AC
  Filled 2017-02-14: qty 1

## 2017-02-14 MED ORDER — PROMETHAZINE HCL 25 MG/ML IJ SOLN: 6.2500 mg | INTRAMUSCULAR | Status: DC | PRN

## 2017-02-14 MED ORDER — MIDAZOLAM HCL 5 MG/5ML IJ SOLN
INTRAMUSCULAR | Status: DC | PRN
  Administered 2017-02-14: 2 mg via INTRAVENOUS

## 2017-02-14 MED ORDER — LACTATED RINGERS IV SOLN
INTRAVENOUS | Status: DC
  Administered 2017-02-14 (×2): via INTRAVENOUS

## 2017-02-14 MED ORDER — FENTANYL CITRATE (PF) 100 MCG/2ML IJ SOLN
INTRAMUSCULAR | Status: AC
  Filled 2017-02-14: qty 2

## 2017-02-14 MED ORDER — MIDAZOLAM HCL 2 MG/2ML IJ SOLN
INTRAMUSCULAR | Status: AC
  Filled 2017-02-14: qty 2

## 2017-02-14 MED ORDER — FENTANYL CITRATE (PF) 100 MCG/2ML IJ SOLN
INTRAMUSCULAR | Status: DC | PRN
Start: 2017-02-14 — End: 2017-02-14
  Administered 2017-02-14: 50 ug via INTRAVENOUS
  Administered 2017-02-14: 100 ug via INTRAVENOUS

## 2017-02-14 MED ORDER — BELLADONNA ALKALOIDS-OPIUM 16.2-60 MG RE SUPP
RECTAL | Status: DC | PRN
  Administered 2017-02-14: 1 via RECTAL

## 2017-02-14 MED ORDER — LIDOCAINE 2% (20 MG/ML) 5 ML SYRINGE
INTRAMUSCULAR | Status: DC | PRN
  Administered 2017-02-14: 80 mg via INTRAVENOUS

## 2017-02-14 MED ORDER — HYDROMORPHONE HCL 1 MG/ML IJ SOLN
INTRAMUSCULAR | Status: AC
  Administered 2017-02-14: 0.25 mg via INTRAVENOUS
  Filled 2017-02-14: qty 1

## 2017-02-14 MED ORDER — PROPOFOL 10 MG/ML IV BOLUS
INTRAVENOUS | Status: AC
  Filled 2017-02-14: qty 40

## 2017-02-14 MED ORDER — HYDROMORPHONE HCL 1 MG/ML IJ SOLN
0.2500 mg | INTRAMUSCULAR | Status: DC | PRN
  Administered 2017-02-14 (×2): 0.25 mg via INTRAVENOUS

## 2017-02-14 MED ORDER — PROPOFOL 10 MG/ML IV BOLUS
INTRAVENOUS | Status: DC | PRN
  Administered 2017-02-14: 150 mg via INTRAVENOUS

## 2017-02-14 MED ORDER — MEPERIDINE HCL 50 MG/ML IJ SOLN: 6.2500 mg | INTRAMUSCULAR | Status: DC | PRN

## 2017-02-14 MED ORDER — OXYCODONE HCL 5 MG/5ML PO SOLN: 5.0000 mg | Freq: Once | ORAL | Status: DC | PRN

## 2017-02-14 MED ORDER — DEXAMETHASONE SODIUM PHOSPHATE 4 MG/ML IJ SOLN
INTRAMUSCULAR | Status: DC | PRN
  Administered 2017-02-14: 10 mg via INTRAVENOUS

## 2017-02-14 SURGICAL SUPPLY — 28 items
BAG URO CATCHER STRL LF (MISCELLANEOUS) ×2 IMPLANT
BASKET DAKOTA 1.9FR 11X120 (BASKET) IMPLANT
BASKET STONE 1.7 NGAGE (UROLOGICAL SUPPLIES) ×1 IMPLANT
BASKET STONE NCOMPASS (UROLOGICAL SUPPLIES) ×1 IMPLANT
BASKET ZERO TIP NITINOL 2.4FR (BASKET) IMPLANT
BSKT STON RTRVL ZERO TP 2.4FR (BASKET)
CATH URET 5FR 28IN OPEN ENDED (CATHETERS) ×2 IMPLANT
CATH URET DUAL LUMEN 6-10FR 50 (CATHETERS) ×1 IMPLANT
CLOTH BEACON ORANGE TIMEOUT ST (SAFETY) ×2 IMPLANT
GLOVE BIOGEL M STRL SZ7.5 (GLOVE) ×2 IMPLANT
GLOVE BIOGEL PI IND STRL 6.5 (GLOVE) IMPLANT
GLOVE BIOGEL PI IND STRL 7.5 (GLOVE) IMPLANT
GLOVE BIOGEL PI INDICATOR 6.5 (GLOVE) ×1
GLOVE BIOGEL PI INDICATOR 7.5 (GLOVE) ×2
GOWN STRL REUS W/TWL XL LVL3 (GOWN DISPOSABLE) ×2 IMPLANT
GUIDEWIRE ANG ZIPWIRE 038X150 (WIRE) IMPLANT
GUIDEWIRE STR DUAL SENSOR (WIRE) ×3 IMPLANT
KIT BALLN UROMAX 15FX4 (MISCELLANEOUS) IMPLANT
KIT BALLN UROMAX 26 75X4 (MISCELLANEOUS) ×1
MANIFOLD NEPTUNE II (INSTRUMENTS) ×2 IMPLANT
PACK CYSTO (CUSTOM PROCEDURE TRAY) ×2 IMPLANT
SHEATH ACCESS URETERAL 24CM (SHEATH) IMPLANT
SHEATH ACCESS URETERAL 38CM (SHEATH) IMPLANT
SHEATH ACCESS URETERAL 54CM (SHEATH) IMPLANT
STENT CONTOUR 8FR X 24 (STENTS) ×1 IMPLANT
SYR 20CC LL (SYRINGE) ×1 IMPLANT
TUBING CONNECTING 10 (TUBING) ×2 IMPLANT
WIRE COONS/BENSON .038X145CM (WIRE) IMPLANT

## 2017-02-14 NOTE — Anesthesia Preprocedure Evaluation (Addendum)
Anesthesia Evaluation  Patient identified by MRN, date of birth, ID band Patient awake    Reviewed: Allergy & Precautions, H&P , NPO status , Patient's Chart, lab work & pertinent test results  Airway Mallampati: II  TM Distance: >3 FB Neck ROM: Full    Dental no notable dental hx. (+) Teeth Intact, Caps, Dental Advisory Given, Chipped,    Pulmonary sleep apnea and Continuous Positive Airway Pressure Ventilation ,    Pulmonary exam normal breath sounds clear to auscultation       Cardiovascular negative cardio ROS Normal cardiovascular exam Rhythm:Regular Rate:Normal     Neuro/Psych negative neurological ROS  negative psych ROS   GI/Hepatic negative GI ROS, Neg liver ROS,   Endo/Other  diabetes, Type 2, Oral Hypoglycemic AgentsHypothyroidism Morbid obesity  Renal/GU negative Renal ROSLeft renal calculus  negative genitourinary   Musculoskeletal negative musculoskeletal ROS (+) Arthritis , Osteoarthritis,    Abdominal (+) + obese,   Peds negative pediatric ROS (+)  Hematology negative hematology ROS (+) anemia ,   Anesthesia Other Findings   Reproductive/Obstetrics negative OB ROS                            Anesthesia Physical  Anesthesia Plan  ASA: III  Anesthesia Plan: General   Post-op Pain Management:    Induction: Intravenous  Airway Management Planned: Oral ETT  Additional Equipment:   Intra-op Plan:   Post-operative Plan: Extubation in OR  Informed Consent: I have reviewed the patients History and Physical, chart, labs and discussed the procedure including the risks, benefits and alternatives for the proposed anesthesia with the patient or authorized representative who has indicated his/her understanding and acceptance.   Dental advisory given  Plan Discussed with:   Anesthesia Plan Comments:         Anesthesia Quick Evaluation

## 2017-02-14 NOTE — H&P (Signed)
f/u for kidney cancer HPI: Carol [name] is a 61 year-old female established patient who is here for kidney cancer follow-up.  The patient is s/p left partial nephrectomy. Her surgery was done 06/10/2016.   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 The resection margins were negative.   Her last CT scan was on approximately 01/25/2017. The results of the CT scan demonstrated Stone in the proximal ureter on the left kidney with associated hydronephrosis and coritical atrophy.   The last CXR was approximately 01/25/2017. The CXR demonstrated Stable nodular density in LUL (stable since 2009).   The patient had labs prior to her office visit today. Pertinent labs: WNL; BUN/Cr 27/1.1.   She is not having flank pain. She has not had blood in her urine recently. She has not recently had unwanted weight loss. She is having normal bowel movements. She does have a good appetite. She is not having pain in new locations. The patient is complaining of incisional hernia or pain.   No pain/issues since last visit.  Stone in her left UPJ with worsening hydronephrosis.    ALLERGIES: No Allergies   MEDICATIONS: Atorvastatin Calcium 10 mg tablet Oral  Furosemide 20 mg tablet Oral  Levothyroxine Sodium 75 mcg tablet Oral    GU PSH: Locm 300-399Mg /Ml Iodine,1Ml - 01/22/2017, 07/14/2016 Partial nephrectomy (laparoscopic) - 06/14/2015     PSH Notes: Kidney Surgery Laparoscopic Partial Nephrectomy, Cholecystectomy, Knee Surgery Left, Shoulder Surgery Left  NON-GU PSH: Cholecystectomy (open) - 04/27/2015      GU PMH: Kidney Cancer Left, expect renal pelvis - 07/18/2016, Renal cell carcinoma of left kidney, - 01/18/2016 Abdominal Pain Unspec, Abdominal pain - 07/06/2015 Neoplasm of unspecified behavior of unspecified kidney, Renal neoplasm - 05/04/2015   NON-GU PMH: Encounter for general adult medical examination without abnormal findings, Encounter for preventive health examination -  01/18/2016 Diverticulitis of intestine, part unspecified, without perforation or abscess without bleeding, Diverticulitis - 04/27/2015 Personal history of other diseases of the digestive system, History of esophageal reflux - 04/27/2015 Personal history of other diseases of the nervous system and sense organs, History of sleep apnea - 04/27/2015 Personal history of other endocrine, nutritional and metabolic disease, History of hypothyroidism - 04/27/2015, History of diabetes mellitus, - 04/27/2015, History of hypercholesterolemia, - 04/27/2015   FAMILY HISTORY: malignant neoplasm of urinary bladder - Runs In Family  SOCIAL HISTORY: Marital Status: Married    Notes: Caffeine use, Occupation, Number of children, Never a smoker, Mother deceased, Alcohol use, Married  REVIEW OF SYSTEMS:    GU Review Female:  Patient reports get up at night to urinate. Patient denies frequent urination, hard to postpone urination, burning /pain with urination, leakage of urine, stream starts and stops, trouble starting your stream, have to strain to urinate, and currently pregnant.   Gastrointestinal (Upper):  Patient denies nausea, vomiting, and indigestion/ heartburn.   Gastrointestinal (Lower):  Patient reports constipation. Patient denies diarrhea.   Constitutional:  Patient denies fever, night sweats, weight loss, and fatigue.   Skin:  Patient denies skin rash/ lesion and itching.   Eyes:  Patient denies blurred vision and double vision.   Ears/ Nose/ Throat:  Patient denies sore throat and sinus problems.   Hematologic/Lymphatic:  Patient denies swollen glands and easy bruising.   Cardiovascular:  Patient denies chest pains and leg swelling.   Respiratory:  Patient denies cough and shortness of breath.   Endocrine:  Patient denies excessive thirst.   Musculoskeletal:  Patient denies back pain and  joint pain.   Neurological:  Patient denies headaches and dizziness.   Psychologic:  Patient denies depression and  anxiety.   VITAL SIGNS:      02/08/2017 02:55 PM    Weight 227 lb / 102.97 kg    Height 62 in / 157.48 cm    BP 113/75 mmHg    Pulse 80 /min    BMI 41.5 kg/m    MULTI-SYSTEM PHYSICAL EXAMINATION:     Constitutional: Well-nourished. No physical deformities. Normally developed. Good grooming.    Respiratory: No labored breathing, no use of accessory muscles. CTA-B    Cardiovascular: Normal temperature, normal extremity pulses, no swelling, no varicosities. RRR    Gastrointestinal: No mass, no tenderness, no rigidity, non obese abdomen.           PAST DATA REVIEWED:  Source Of History:  Patient Records Review:  Pathology Reports, Previous Patient Records X-Ray Review: C.T. Abdomen/Pelvis: Reviewed Films.    PROCEDURES:   Urinalysis  Dipstick Dipstick Cont'd Color: Yellow Bilirubin: Neg Appearance: Clear Ketones: Neg Specific Gravity: 1.020 Blood: Neg pH: <=5.0 Protein: Neg Glucose: Neg Urobilinogen: 0.2  Nitrites: Neg  Leukocyte Esterase: Neg   ASSESSMENT:    ICD-10 Details 1 GU:  Kidney Cancer Left, expect renal pelvis - C64.2 Left, NED on todays evaluation. HEr renal function is stable. However she has an obstructing stone in the left ureter around the level of her ureter stricture. Fortunately she is not having any pain. 2  Calculus Kidney and Ureter - N20.2   PLAN:  Orders   Schedule  Labs: 6 Months - CMP  6 Months - Urinalysis X-Rays: 6 Months - C.T. Abdomen/Pelvis With and Without I.V. - Eval renal mass  6 Months - Chest X-Ray Outside. Return Visit/Planned Activity: 6 Months Return Visit/Planned Activity: ASAP - Schedule Surgery Document  Letter(s):  Created for Patient: Clinical Summary The patient is doing well and has no evidence of recurrence of their kidney cancer. I will plan to follow-up with them in 6 months.  Notes:  I suggested that we go get her stones in the left kidney which will also provide the opportunity to dilate that segment of the  ureter. I discussed the surgery with the patient in detail and explained the risk/benefits. I will plan to leave her stent and remove it in clinic in 10-14 days.

## 2017-02-14 NOTE — Discharge Instructions (Signed)
DISCHARGE INSTRUCTIONS FOR KIDNEY STONE/URETERAL STENT   MEDICATIONS:  1.  Resume all your other meds from home - except do not take any extra narcotic pain meds that you may have at home.  2. Pyridium is to help with the burning/stinging when you urinate. 3. Tramadol is for moderate/severe pain, otherwise taking upto 1000 mg every 6 hours of plainTylenol will help treat your pain.     ACTIVITY:  1. No strenuous activity x 1week  2. No driving while on narcotic pain medications  3. Drink plenty of water  4. Continue to walk at home - you can still get blood clots when you are at home, so keep active, but don't over do it.  5. May return to work/school tomorrow or when you feel ready   BATHING:  1. You can shower and we recommend daily showers   SIGNS/SYMPTOMS TO CALL:  Please call us if you have a fever greater than 101.5, uncontrolled nausea/vomiting, uncontrolled pain, dizziness, unable to urinate, bloody urine, chest pain, shortness of breath, leg swelling, leg pain, redness around wound, drainage from wound, or any other concerns or questions.   You can reach [place] at [phone].   FOLLOW-UP:  1. You have an appointment for stent removal in 2 week.   General Anesthesia, Adult, Care After These instructions provide you with information about caring for yourself after your procedure. Your health care provider may also give you more specific instructions. Your treatment has been planned according to current medical practices, but problems sometimes occur. Call your health care provider if you have any problems or questions after your procedure. What can I expect after the procedure? After the procedure, it is common to have:  Vomiting.  A sore throat.  Mental slowness. It is common to feel:  Nauseous.  Cold or shivery.  Sleepy.  Tired.  Sore or achy, even in parts of your body where you did not have surgery. Follow these instructions at home: For at least 24 hours  after the procedure:   Do not:  Participate in activities where you could fall or become injured.  Drive.  Use heavy machinery.  Drink alcohol.  Take sleeping pills or medicines that cause drowsiness.  Make important decisions or sign legal documents.  Take care of children on your own.  Rest. Eating and drinking   If you vomit, drink water, juice, or soup when you can drink without vomiting.  Drink enough fluid to keep your urine clear or pale yellow.  Make sure you have little or no nausea before eating solid foods.  Follow the diet recommended by your health care provider. General instructions   Have a responsible adult stay with you until you are awake and alert.  Return to your normal activities as told by your health care provider. Ask your health care provider what activities are safe for you.  Take over-the-counter and prescription medicines only as told by your health care provider.  If you smoke, do not smoke without supervision.  Keep all follow-up visits as told by your health care provider. This is important. Contact a health care provider if:  You continue to have nausea or vomiting at home, and medicines are not helpful.  You cannot drink fluids or start eating again.  You cannot urinate after 8-12 hours.  You develop a skin rash.  You have fever.  You have increasing redness at the site of your procedure. Get help right away if:  You have difficulty breathing.  You have chest pain.  You have unexpected bleeding.  You feel that you are having a life-threatening or urgent problem. This information is not intended to replace advice given to you by your health care provider. Make sure you discuss any questions you have with your health care provider. Document Released: 02/12/2001 Document Revised: 04/10/2016 Document Reviewed: 10/21/2015 Elsevier Interactive Patient Education  2017 [employer].

## 2017-02-14 NOTE — Anesthesia Postprocedure Evaluation (Signed)
Anesthesia Post Note  Patient: Carol [name]  Procedure(s) Performed: Procedure(s) (LRB): URETEROSCOPY WITH HOLMIUM LASER LITHOTRIPSY, URETERAL BALLOON DILATION (Left) URETERAL BIOPSY (Left)  Patient location during evaluation: PACU Anesthesia Type: General Level of consciousness: awake and alert Pain management: pain level controlled Vital Signs Assessment: post-procedure vital signs reviewed and stable Respiratory status: spontaneous breathing, nonlabored ventilation and respiratory function stable Cardiovascular status: blood pressure returned to baseline and stable Postop Assessment: no signs of nausea or vomiting Anesthetic complications: no       Last Vitals:  Vitals:   02/14/17 1515 02/14/17 1530  BP: 136/67 130/63  Pulse: 77 65  Resp: 15 15  Temp:  36.4 C    Last Pain:  Vitals:   02/14/17 1515  TempSrc:   PainSc: 0-No pain                 Lynda Rainwater

## 2017-02-14 NOTE — Transfer of Care (Signed)
Immediate Anesthesia Transfer of Care Note  Patient: Carol [name]  Procedure(s) Performed: Procedure(s): URETEROSCOPY WITH HOLMIUM LASER LITHOTRIPSY, URETERAL BALLOON DILATION (Left) URETERAL BIOPSY (Left)  Patient Location: PACU  Anesthesia Type:General  Level of Consciousness: Patient easily awoken, sedated, comfortable, cooperative, following commands, responds to stimulation.   Airway & Oxygen Therapy: Patient spontaneously breathing, ventilating well, oxygen via simple oxygen mask.  Post-op Assessment: Report given to PACU RN, vital signs reviewed and stable, moving all extremities.   Post vital signs: Reviewed and stable.  Complications: No apparent anesthesia complications  Last Vitals:  Vitals:   02/14/17 1018 02/14/17 1445  BP: 130/69 139/82  Pulse: 83 85  Resp: 18 15  Temp: 36.7 C 36.5 C    Last Pain:  Vitals:   02/14/17 1445  TempSrc:   PainSc: 0-No pain      Patients Stated Pain Goal: 4 (18/28/83 3744)  Complications: No apparent anesthesia complications

## 2017-02-14 NOTE — Anesthesia Procedure Notes (Signed)
Procedure Name: Intubation Date/Time: 02/14/2017 12:23 PM Performed by: Wanita Chamberlain Pre-anesthesia Checklist: Patient identified, Emergency Drugs available, Suction available, Patient being monitored and Timeout performed Patient Re-evaluated:Patient Re-evaluated prior to inductionOxygen Delivery Method: Circle system utilized Preoxygenation: Pre-oxygenation with 100% oxygen Intubation Type: IV induction Ventilation: Mask ventilation without difficulty Grade View: Grade I Tube type: Oral Number of attempts: 1 Airway Equipment and Method: Stylet Placement Confirmation: breath sounds checked- equal and bilateral,  positive ETCO2 and ETT inserted through vocal cords under direct vision Secured at: 22 cm Tube secured with: Tape Dental Injury: Teeth and Oropharynx as per pre-operative assessment

## 2017-02-14 NOTE — Op Note (Signed)
Preoperative diagnosis:  1. Left ureteral stricture, mid ureter 2. Left ureteral stone and nonobstructing left renal pelvic stone    Postoperative diagnosis:        1.   Left ureteral stricture, mid ureter        2.  Left ureteral stone and nonobstructing left renal pelvic stone   Procedure: 1. Left ureteral balloon dilation 2. Left ureteroscopy with stone removal, no laser necessary 3. Left retrograde pyelogram with interpretation 4. Left ureteral stent placement  Surgeon: Ardis Hughs, MD  Anesthesia: General  Complications: None  Intraoperative findings:  #1: The patient had a fairly sizable stricture from approximately L 2 to L4.  #2: She also had  a narrow ureteropelvic junction making it very difficult scope beyond this area and into the renal pelvis. Ultimately, I was able to get this dilated with a 4 cm times 15 French ureteral balloon dilator and basket the nonobstructing stone in the left kidney. The ureteral stent was removed with the basket as well, and the laser was required. #3: Retrograde pyelogram was performed in the routine sterile fashion using 10 mL of Omnipaque contrast. Stone is treated a proximally 5-6 cm ureteral stricture in the mid ureter from L2-L4 with proximal severe dilation and hydronephrosis. The calyces were blunted. The distal ureter was normal in appearance. #4: There is an tissue within the ureter around the area of the stricture which I was able to remove and sent for a permanent specimen.  EBL: Minimal  Specimens: #1: Left ureteral biopsy #2: Left ureteral stone, this was taken to the light urology specialist for stent, positioned analysis.  Indication: Carol [name] is a 61 y.o. patient with history of a partial nephrectomy for renal cell carcinoma. Her postoperative recovery was uneventful. However in her surveillance imaging she was noted to have a midureteral stricture just beyond the U P.J, this area also had a stone, although the  patient was asymptomatic. Subsequent imaging demonstrated worsening of this area with also a nonobstructing stone which is a new finding..  After reviewing the management options for treatment, he elected to proceed with the above surgical procedure(s). We have discussed the potential benefits and risks of the procedure, side effects of the proposed treatment, the likelihood of the patient achieving the goals of the procedure, and any potential problems that might occur during the procedure or recuperation. Informed consent has been obtained.  Description of procedure:  The patient was taken to the operating room and general anesthesia was induced.  The patient was placed in the dorsal lithotomy position, prepped and draped in the usual sterile fashion, and preoperative antibiotics were administered. A preoperative time-out was performed.   A 21 French 30 cystoscope was passed to the patient's urethra into the bladder under visual guidance. A 30 cystoscopic evaluation was performed to demonstrate no unusual findings. The bladder mucosa was normal without any tumor, stones, or other abnormality. The ureteral orifices were orthotopic.  I then inserted a 5 [place] open ureteral catheter into the patient's left renal orifice instilled 10 mL of Omnipaque contrast performing a retrograde pyelogram the above findings. I then advanced a 0.038 since her wire through the patient's ureteral catheter and up into the renal pelvis. I then advanced a 4 cm times 15 French balloon dilator to the area of the stricture as seen on the retrograde pyelogram. I inflated the balloon under fluoroscopic guidance and left it there for 90 seconds. I then backed the balloon back centimeters and repeated  the dilation. After proximal and 90 seconds and deflated the balloon and removed the dilator.  I then used a 4.5 [place] semirigid ureteroscope and was able to cannulate the left ureteral orifice and passed the scope up to the area of  the dilated ureter. In this area, there was a lot of tissue debris as well as soft stone fragments. I was able to use a engage basket and removed the tissue as well as stone fragments without laser. The tissue fragments were sent for permanent specimen. The stones were taken to light urology for stone comes in for analysis.  At this point I was unable to advance the semirigid scope beyond the proximal aspect of the stricture and is such advanced a second wire through a dual-lumen catheter during the dissection wire was up in the renal pelvis. I then redilated the UPJ region using the same ureteral balloon dilator in a similar fashion.  I then passed the single-lumen 4.5 French flexible ureteroscope over the second wire and into the proximal ureter. With some persistent pressure and manipulation I was able to get the scope beyond this cyst area and ultimately the tip into the UPJ. I then performed a retrograde pyelogram again and aspirated all blood clots and dirty urine. Then with some gentle irrigation was able to see throughout the renal pelvis and did identify the stone in the mid pole region. Using a 0 tipped basket I was able to grasp the stone and remove it without laser fragmentation.  I then backed out the scope slowly under visual guidance ensuring that the ureter was clear there were no additional stone fragments or any additional tissue contents. I then replaced the 5 French ureteral catheter and again performed retrograde pyelogram is draining a patent ureter.  I then backloaded the sensor wire through the cystoscope and gently inserted Cisco back in the patient's bladder. Using the Seldinger technique I was able to advance the 8 [place] times 24 cm double-J ureteral stent over the wire into the left renal pelvis. Under fluoroscopic guidance I was able to place the tip of the curl in the upper pole of the left kidney. Once the stent was noted to be in proper position I backed the scope up to the  bladder neck and advance his stent all the way in to the bladder neck before removing the wire. Stent was noted to be well placed in the bladder as well. The bladder was subsequently drained. A BNO spell as well as place in the patient's rectum. Urethral lidocaine jelly was inserted and the patient's urethra. The patient was subsequent x-ray returned to PACU stable condition.  The patient will be scheduled for stent removal in 14 days.  Ardis Hughs, M.D.

## 2017-02-15 ENCOUNTER — Encounter (HOSPITAL_COMMUNITY): Payer: Self-pay | Admitting: Urology

## 2017-06-17 ENCOUNTER — Other Ambulatory Visit: Payer: Self-pay | Admitting: Cardiovascular Disease

## 2017-09-07 ENCOUNTER — Ambulatory Visit (HOSPITAL_COMMUNITY)
Admission: RE | Admit: 2017-09-07 | Discharge: 2017-09-07 | Disposition: A | Discharge status: Home / Self Care | Payer: BLUE CROSS/BLUE SHIELD | Source: Ambulatory Visit | Attending: Urology | Admitting: Urology

## 2017-09-07 ENCOUNTER — Other Ambulatory Visit: Payer: Self-pay | Admitting: Urology

## 2017-09-07 DIAGNOSIS — C642 Malignant neoplasm of left kidney, except renal pelvis: Secondary | ICD-10-CM | POA: Insufficient documentation

## 2017-09-07 DIAGNOSIS — J9811 Atelectasis: Secondary | ICD-10-CM | POA: Insufficient documentation

## 2017-10-17 ENCOUNTER — Telehealth: Payer: Self-pay | Admitting: *Deleted

## 2017-10-17 NOTE — Telephone Encounter (Signed)
CPAP supply order signed by Dr. Claiborne Billings and returned to [facility] to RT in office.

## 2018-09-11 ENCOUNTER — Ambulatory Visit (HOSPITAL_COMMUNITY)
Admission: RE | Admit: 2018-09-11 | Discharge: 2018-09-11 | Disposition: A | Discharge status: Home / Self Care | Payer: BLUE CROSS/BLUE SHIELD | Source: Ambulatory Visit | Attending: Urology | Admitting: Urology

## 2018-09-11 ENCOUNTER — Other Ambulatory Visit: Payer: Self-pay | Admitting: Urology

## 2018-09-11 DIAGNOSIS — C642 Malignant neoplasm of left kidney, except renal pelvis: Secondary | ICD-10-CM | POA: Insufficient documentation

## 2018-09-16 ENCOUNTER — Other Ambulatory Visit: Payer: Self-pay | Admitting: Urology

## 2018-09-16 DIAGNOSIS — C642 Malignant neoplasm of left kidney, except renal pelvis: Secondary | ICD-10-CM

## 2018-09-18 ENCOUNTER — Ambulatory Visit (HOSPITAL_COMMUNITY)
Admission: RE | Admit: 2018-09-18 | Discharge: 2018-09-18 | Disposition: A | Discharge status: Home / Self Care | Payer: BLUE CROSS/BLUE SHIELD | Source: Ambulatory Visit | Attending: Urology | Admitting: Urology

## 2018-09-18 DIAGNOSIS — C642 Malignant neoplasm of left kidney, except renal pelvis: Secondary | ICD-10-CM

## 2018-09-18 MED ORDER — TECHNETIUM TC 99M MERTIATIDE
5.3000 | Freq: Once | INTRAVENOUS | Status: AC | PRN
  Administered 2018-09-18: 5.3 via INTRAVENOUS

## 2018-09-18 MED ORDER — FUROSEMIDE 10 MG/ML IJ SOLN
50.0000 mg | Freq: Once | INTRAMUSCULAR | Status: AC
  Administered 2018-09-18: 50 mg via INTRAVENOUS

## 2018-09-18 MED ORDER — FUROSEMIDE 10 MG/ML IJ SOLN
INTRAMUSCULAR | Status: AC
  Filled 2018-09-18: qty 8

## 2018-11-18 NOTE — Progress Notes (Signed)
cxr 09-11-18 Epic

## 2018-11-18 NOTE — Patient Instructions (Addendum)
PELAGIA IACOBUCCI  11/18/2018   Your procedure is scheduled on: 11-22-18    Report to [facility] Main  Entrance     Report to admitting at 5:30AM    Call this number if you have problems the morning of surgery [phone]   PLEASE BRING CPAP MASK AND  TUBING ONLY. DEVICE WILL BE PROVIDED!     Remember: Do not eat food or drink liquids :After Midnight. BRUSH YOUR TEETH MORNING OF SURGERY AND RINSE YOUR MOUTH OUT, NO CHEWING GUM CANDY OR MINTS.     Take these medicines the morning of surgery with A SIP OF WATER: LEVOTHYROXINE, PRO-AIR INHALER IF NEEDED   DIABETES INSTRUCTIONS:  DO NOT TAKE ANY DIABETIC MEDICATIONS DAY OF YOUR SURGERY PLEASE CHECK YOU MORNING BLOOD SUGAR AND REPORT IT YOUR NURSE ON ARRIVAL TO [place].                               You may not have any metal on your body including hair pins and              piercings  Do not wear jewelry, make-up, lotions, powders or perfumes, deodorant             Do not wear nail polish.  Do not shave  48 hours prior to surgery.          Do not bring valuables to the hospital. [place].  Contacts, dentures or bridgework may not be worn into surgery.  Leave suitcase in the car. After surgery it may be brought to your room.                   Please read over the following fact sheets you were given: _____________________________________________________________________             [facility] - Preparing for Surgery Before surgery, you can play an important role.  Because skin is not sterile, your skin needs to be as free of germs as possible.  You can reduce the number of germs on your skin by washing with CHG (chlorahexidine gluconate) soap before surgery.  CHG is an antiseptic cleaner which kills germs and bonds with the skin to continue killing germs even after washing. Please DO NOT use if you have an allergy to CHG or antibacterial soaps.  If  your skin becomes reddened/irritated stop using the CHG and inform your nurse when you arrive at Short Stay. Do not shave (including legs and underarms) for at least 48 hours prior to the first CHG shower.  You may shave your face/neck. Please follow these instructions carefully:  1.  Shower with CHG Soap the night before surgery and the  morning of Surgery.  2.  If you choose to wash your hair, wash your hair first as usual with your  normal  shampoo.  3.  After you shampoo, rinse your hair and body thoroughly to remove the  shampoo.                           4.  Use CHG as you would any other liquid soap.  You can apply chg directly  to the skin and wash  Gently with a scrungie or clean washcloth.  5.  Apply the CHG Soap to your body ONLY FROM THE NECK DOWN.   Do not use on face/ open                           Wound or open sores. Avoid contact with eyes, ears mouth and genitals (private parts).                       Wash face,  Genitals (private parts) with your normal soap.             6.  Wash thoroughly, paying special attention to the area where your surgery  will be performed.  7.  Thoroughly rinse your body with warm water from the neck down.  8.  DO NOT shower/wash with your normal soap after using and rinsing off  the CHG Soap.                9.  Pat yourself dry with a clean towel.            10.  Wear clean pajamas.            11.  Place clean sheets on your bed the night of your first shower and do not  sleep with pets. Day of Surgery : Do not apply any lotions/deodorants the morning of surgery.  Please wear clean clothes to the hospital/surgery center.  FAILURE TO FOLLOW THESE INSTRUCTIONS MAY RESULT IN THE CANCELLATION OF YOUR SURGERY PATIENT SIGNATURE_________________________________  NURSE SIGNATURE__________________________________  ________________________________________________________________________

## 2018-11-19 ENCOUNTER — Encounter (HOSPITAL_COMMUNITY)
Admission: RE | Admit: 2018-11-19 | Discharge: 2018-11-19 | Disposition: A | Discharge status: Home / Self Care | Payer: BLUE CROSS/BLUE SHIELD | Source: Ambulatory Visit | Attending: Urology | Admitting: Urology

## 2018-11-19 ENCOUNTER — Other Ambulatory Visit: Payer: Self-pay

## 2018-11-19 ENCOUNTER — Encounter (HOSPITAL_COMMUNITY): Payer: Self-pay

## 2018-11-19 DIAGNOSIS — Z01818 Encounter for other preprocedural examination: Secondary | ICD-10-CM | POA: Diagnosis present

## 2018-11-19 DIAGNOSIS — R9431 Abnormal electrocardiogram [ECG] [EKG]: Secondary | ICD-10-CM | POA: Diagnosis not present

## 2018-11-19 DIAGNOSIS — E119 Type 2 diabetes mellitus without complications: Secondary | ICD-10-CM | POA: Insufficient documentation

## 2018-11-19 LAB — HEMOGLOBIN A1C
Hgb A1c MFr Bld: 5.6 % (ref 4.8–5.6)
Mean Plasma Glucose: 114.02 mg/dL

## 2018-11-19 LAB — BASIC METABOLIC PANEL
Anion gap: 9 (ref 5–15)
BUN: 24 mg/dL — ABNORMAL HIGH (ref 8–23)
CO2: 25 mmol/L (ref 22–32)
Calcium: 8.9 mg/dL (ref 8.9–10.3)
Chloride: 107 mmol/L (ref 98–111)
Creatinine, Ser: 0.92 mg/dL (ref 0.44–1.00)
GFR calc Af Amer: >60 mL/min (ref >60)
GFR calc non Af Amer: >60 mL/min (ref >60)
Glucose, Bld: 138 mg/dL — ABNORMAL HIGH (ref 70–99)
POTASSIUM: 4.4 mmol/L (ref 3.5–5.1)
Sodium: 141 mmol/L (ref 135–145)

## 2018-11-19 LAB — CBC
HCT: 41.8 % (ref 36.0–46.0)
Hemoglobin: 14.2 g/dL (ref 12.0–15.0)
MCH: 30.7 pg (ref 26.0–34.0)
MCHC: 34 g/dL (ref 30.0–36.0)
MCV: 90.5 fL (ref 80.0–100.0)
PLATELETS: 175 10*3/uL (ref 150–400)
RBC: 4.62 MIL/uL (ref 3.87–5.11)
RDW: 13 % (ref 11.5–15.5)
WBC: 5.1 10*3/uL (ref 4.0–10.5)
nRBC: 0 % (ref 0.0–0.2)

## 2018-11-19 LAB — GLUCOSE, CAPILLARY: Glucose-Capillary: 134 mg/dL — ABNORMAL HIGH (ref 70–99)

## 2018-11-21 NOTE — Progress Notes (Signed)
No orders for tomorrow's surgery.  Notified [facility] office nurse, who stated she would send an electronic message to Dr. Louis Meckel.  Coolidge Breeze, RN 11/21/2018

## 2018-11-21 NOTE — Anesthesia Preprocedure Evaluation (Addendum)
Anesthesia Evaluation  Patient identified by MRN, date of birth, ID band Patient awake    Reviewed: Allergy & Precautions, H&P , NPO status , Patient's Chart, lab work & pertinent test results  History of Anesthesia Complications Negative for: history of anesthetic complications  Airway Mallampati: II  TM Distance: >3 FB Neck ROM: Full    Dental no notable dental hx. (+) Caps, Chipped, Dental Advisory Given,    Pulmonary sleep apnea and Continuous Positive Airway Pressure Ventilation ,    Pulmonary exam normal        Cardiovascular negative cardio ROS Normal cardiovascular exam     Neuro/Psych negative neurological ROS  negative psych ROS   GI/Hepatic negative GI ROS, Neg liver ROS,   Endo/Other  diabetes, Type 2, Oral Hypoglycemic AgentsHypothyroidism Morbid obesity  Renal/GU Ureteral stricture  negative genitourinary   Musculoskeletal negative musculoskeletal ROS (+) Arthritis , Osteoarthritis,    Abdominal (+) + obese,   Peds negative pediatric ROS (+)  Hematology negative hematology ROS (+) anemia ,   Anesthesia Other Findings   Reproductive/Obstetrics negative OB ROS                            Anesthesia Physical  Anesthesia Plan  ASA: III  Anesthesia Plan: General   Post-op Pain Management:    Induction: Intravenous  PONV Risk Score and Plan: 3 and Ondansetron, Dexamethasone and Scopolamine patch - Pre-op  Airway Management Planned: Oral ETT  Additional Equipment:   Intra-op Plan:   Post-operative Plan: Extubation in OR  Informed Consent: I have reviewed the patients History and Physical, chart, labs and discussed the procedure including the risks, benefits and alternatives for the proposed anesthesia with the patient or authorized representative who has indicated his/her understanding and acceptance.   Dental advisory given  Plan Discussed with: CRNA and  Anesthesiologist  Anesthesia Plan Comments:       Anesthesia Quick Evaluation

## 2018-11-22 ENCOUNTER — Other Ambulatory Visit: Payer: Self-pay

## 2018-11-22 ENCOUNTER — Inpatient Hospital Stay (HOSPITAL_COMMUNITY)
Admission: RE | Admit: 2018-11-22 | Discharge: 2018-11-25 | DRG: 660 | Disposition: A | Discharge status: Home / Self Care | Payer: BLUE CROSS/BLUE SHIELD | Attending: Urology | Admitting: Urology

## 2018-11-22 ENCOUNTER — Inpatient Hospital Stay (HOSPITAL_COMMUNITY): Payer: BLUE CROSS/BLUE SHIELD | Admitting: Anesthesiology

## 2018-11-22 ENCOUNTER — Encounter (HOSPITAL_COMMUNITY): Payer: Self-pay | Admitting: Emergency Medicine

## 2018-11-22 ENCOUNTER — Inpatient Hospital Stay (HOSPITAL_COMMUNITY): Payer: BLUE CROSS/BLUE SHIELD | Admitting: Physician Assistant

## 2018-11-22 ENCOUNTER — Encounter (HOSPITAL_COMMUNITY)
Admission: RE | Disposition: A | Discharge status: Home / Self Care | Payer: Self-pay | Source: Home / Self Care | Attending: Urology

## 2018-11-22 ENCOUNTER — Inpatient Hospital Stay (HOSPITAL_COMMUNITY): Payer: BLUE CROSS/BLUE SHIELD

## 2018-11-22 DIAGNOSIS — R509 Fever, unspecified: Secondary | ICD-10-CM

## 2018-11-22 DIAGNOSIS — Z8052 Family history of malignant neoplasm of bladder: Secondary | ICD-10-CM | POA: Diagnosis not present

## 2018-11-22 DIAGNOSIS — N132 Hydronephrosis with renal and ureteral calculous obstruction: Principal | ICD-10-CM | POA: Diagnosis present

## 2018-11-22 DIAGNOSIS — K660 Peritoneal adhesions (postprocedural) (postinfection): Secondary | ICD-10-CM | POA: Diagnosis present

## 2018-11-22 DIAGNOSIS — Z6841 Body Mass Index (BMI) 40.0 and over, adult: Secondary | ICD-10-CM | POA: Diagnosis not present

## 2018-11-22 DIAGNOSIS — Z79890 Hormone replacement therapy (postmenopausal): Secondary | ICD-10-CM | POA: Diagnosis not present

## 2018-11-22 DIAGNOSIS — J9811 Atelectasis: Secondary | ICD-10-CM | POA: Diagnosis present

## 2018-11-22 DIAGNOSIS — M1990 Unspecified osteoarthritis, unspecified site: Secondary | ICD-10-CM | POA: Diagnosis present

## 2018-11-22 DIAGNOSIS — E119 Type 2 diabetes mellitus without complications: Secondary | ICD-10-CM | POA: Diagnosis present

## 2018-11-22 DIAGNOSIS — D649 Anemia, unspecified: Secondary | ICD-10-CM | POA: Diagnosis present

## 2018-11-22 DIAGNOSIS — Z9049 Acquired absence of other specified parts of digestive tract: Secondary | ICD-10-CM | POA: Diagnosis not present

## 2018-11-22 DIAGNOSIS — K429 Umbilical hernia without obstruction or gangrene: Secondary | ICD-10-CM | POA: Diagnosis present

## 2018-11-22 DIAGNOSIS — N135 Crossing vessel and stricture of ureter without hydronephrosis: Secondary | ICD-10-CM | POA: Diagnosis present

## 2018-11-22 DIAGNOSIS — Z7984 Long term (current) use of oral hypoglycemic drugs: Secondary | ICD-10-CM

## 2018-11-22 HISTORY — PX: ROBOT ASSISTED PYELOPLASTY: SHX5143

## 2018-11-22 HISTORY — PX: CYSTOSCOPY W/ RETROGRADES: SHX1426

## 2018-11-22 LAB — POCT I-STAT 4, (NA,K, GLUC, HGB,HCT)
GLUCOSE: 241 mg/dL — AB (ref 70–99)
Glucose, Bld: 248 mg/dL — ABNORMAL HIGH (ref 70–99)
HCT: 33 % — ABNORMAL LOW (ref 36.0–46.0)
HCT: 27 % — ABNORMAL LOW (ref 36.0–46.0)
HEMOGLOBIN: 9.2 g/dL — AB (ref 12.0–15.0)
Hemoglobin: 11.2 g/dL — ABNORMAL LOW (ref 12.0–15.0)
Potassium: 4.3 mmol/L (ref 3.5–5.1)
Potassium: 3.7 mmol/L (ref 3.5–5.1)
Sodium: 139 mmol/L (ref 135–145)
Sodium: 140 mmol/L (ref 135–145)

## 2018-11-22 LAB — HEMOGLOBIN AND HEMATOCRIT, BLOOD
HCT: 30.1 % — ABNORMAL LOW (ref 36.0–46.0)
HEMOGLOBIN: 10.2 g/dL — AB (ref 12.0–15.0)

## 2018-11-22 LAB — GLUCOSE, CAPILLARY
Glucose-Capillary: 155 mg/dL — ABNORMAL HIGH (ref 70–99)
Glucose-Capillary: 266 mg/dL — ABNORMAL HIGH (ref 70–99)
Glucose-Capillary: 249 mg/dL — ABNORMAL HIGH (ref 70–99)
Glucose-Capillary: 215 mg/dL — ABNORMAL HIGH (ref 70–99)

## 2018-11-22 SURGERY — PYELOPLASTY, ROBOT-ASSISTED
Anesthesia: Monitor Anesthesia Care | Laterality: Left

## 2018-11-22 MED ORDER — CEFAZOLIN SODIUM-DEXTROSE 2-4 GM/100ML-% IV SOLN
INTRAVENOUS | Status: AC
  Filled 2018-11-22: qty 100

## 2018-11-22 MED ORDER — FENTANYL CITRATE (PF) 250 MCG/5ML IJ SOLN
INTRAMUSCULAR | Status: AC
  Filled 2018-11-22: qty 5

## 2018-11-22 MED ORDER — SODIUM CHLORIDE (PF) 0.9 % IJ SOLN
INTRAMUSCULAR | Status: AC
  Filled 2018-11-22: qty 50

## 2018-11-22 MED ORDER — PHENYLEPHRINE HCL 10 MG/ML IJ SOLN
INTRAMUSCULAR | Status: AC
  Filled 2018-11-22: qty 1

## 2018-11-22 MED ORDER — LEVOTHYROXINE SODIUM 50 MCG PO TABS
75.0000 ug | ORAL_TABLET | Freq: Every day | ORAL | Status: DC
  Administered 2018-11-23 – 2018-11-25 (×3): 75 ug via ORAL
  Filled 2018-11-22 (×3): qty 1

## 2018-11-22 MED ORDER — PROPOFOL 10 MG/ML IV BOLUS
INTRAVENOUS | Status: DC | PRN
  Administered 2018-11-22: 170 mg via INTRAVENOUS

## 2018-11-22 MED ORDER — ONDANSETRON HCL 4 MG/2ML IJ SOLN
INTRAMUSCULAR | Status: AC
  Filled 2018-11-22: qty 4

## 2018-11-22 MED ORDER — ALBUTEROL SULFATE (2.5 MG/3ML) 0.083% IN NEBU: 2.5000 mg | INHALATION_SOLUTION | RESPIRATORY_TRACT | Status: DC | PRN

## 2018-11-22 MED ORDER — BUPIVACAINE-EPINEPHRINE (PF) 0.25% -1:200000 IJ SOLN
INTRAMUSCULAR | Status: AC
  Filled 2018-11-22: qty 30

## 2018-11-22 MED ORDER — INSULIN ASPART 100 UNIT/ML ~~LOC~~ SOLN
SUBCUTANEOUS | Status: AC
  Filled 2018-11-22: qty 1

## 2018-11-22 MED ORDER — SODIUM CHLORIDE 0.9% IV SOLUTION: Freq: Once | INTRAVENOUS | Status: DC

## 2018-11-22 MED ORDER — EPHEDRINE 5 MG/ML INJ
INTRAVENOUS | Status: AC
  Filled 2018-11-22: qty 20

## 2018-11-22 MED ORDER — SUGAMMADEX SODIUM 500 MG/5ML IV SOLN
INTRAVENOUS | Status: AC
  Filled 2018-11-22: qty 5

## 2018-11-22 MED ORDER — MIDAZOLAM HCL 2 MG/2ML IJ SOLN
INTRAMUSCULAR | Status: AC
  Filled 2018-11-22: qty 2

## 2018-11-22 MED ORDER — ONDANSETRON HCL 4 MG/2ML IJ SOLN
INTRAMUSCULAR | Status: DC | PRN
  Administered 2018-11-22: 4 mg via INTRAVENOUS

## 2018-11-22 MED ORDER — FENTANYL CITRATE (PF) 100 MCG/2ML IJ SOLN
INTRAMUSCULAR | Status: AC
  Filled 2018-11-22: qty 2

## 2018-11-22 MED ORDER — CEFAZOLIN SODIUM-DEXTROSE 2-3 GM-%(50ML) IV SOLR
INTRAVENOUS | Status: DC | PRN
  Administered 2018-11-22 (×3): 2 g via INTRAVENOUS

## 2018-11-22 MED ORDER — DIPHENHYDRAMINE HCL 50 MG/ML IJ SOLN: 12.5000 mg | Freq: Four times a day (QID) | INTRAMUSCULAR | Status: DC | PRN

## 2018-11-22 MED ORDER — ONDANSETRON HCL 4 MG/2ML IJ SOLN: 4.0000 mg | INTRAMUSCULAR | Status: DC | PRN

## 2018-11-22 MED ORDER — PROPOFOL 10 MG/ML IV BOLUS
INTRAVENOUS | Status: AC
  Filled 2018-11-22: qty 20

## 2018-11-22 MED ORDER — DEXAMETHASONE SODIUM PHOSPHATE 10 MG/ML IJ SOLN
INTRAMUSCULAR | Status: DC | PRN
  Administered 2018-11-22: 4 mg via INTRAVENOUS

## 2018-11-22 MED ORDER — DIPHENHYDRAMINE HCL 12.5 MG/5ML PO ELIX: 12.5000 mg | ORAL_SOLUTION | Freq: Four times a day (QID) | ORAL | Status: DC | PRN

## 2018-11-22 MED ORDER — SODIUM CHLORIDE 0.9 % IV SOLN
INTRAVENOUS | Status: DC | PRN
  Administered 2018-11-22: 13:00:00 via INTRAVENOUS

## 2018-11-22 MED ORDER — MIDAZOLAM HCL 2 MG/2ML IJ SOLN
INTRAMUSCULAR | Status: DC | PRN
  Administered 2018-11-22 (×2): 1 mg via INTRAVENOUS

## 2018-11-22 MED ORDER — SODIUM CHLORIDE 0.45 % IV SOLN
INTRAVENOUS | Status: DC
  Administered 2018-11-22 – 2018-11-23 (×2): via INTRAVENOUS

## 2018-11-22 MED ORDER — TRAMADOL HCL 50 MG PO TABS
50.0000 mg | ORAL_TABLET | Freq: Four times a day (QID) | ORAL | Status: DC | PRN
  Administered 2018-11-22: 100 mg via ORAL
  Administered 2018-11-23: 50 mg via ORAL
  Filled 2018-11-22: qty 1
  Filled 2018-11-22: qty 2

## 2018-11-22 MED ORDER — BUPIVACAINE-EPINEPHRINE (PF) 0.25% -1:200000 IJ SOLN
INTRAMUSCULAR | Status: DC | PRN
  Administered 2018-11-22: 30 mL

## 2018-11-22 MED ORDER — SUGAMMADEX SODIUM 500 MG/5ML IV SOLN
INTRAVENOUS | Status: DC | PRN
  Administered 2018-11-22: 350 mg via INTRAVENOUS

## 2018-11-22 MED ORDER — ALBUMIN HUMAN 5 % IV SOLN
INTRAVENOUS | Status: DC | PRN
  Administered 2018-11-22 (×5): via INTRAVENOUS

## 2018-11-22 MED ORDER — PROMETHAZINE HCL 25 MG/ML IJ SOLN
6.2500 mg | INTRAMUSCULAR | Status: DC | PRN
  Administered 2018-11-22: 6.25 mg via INTRAVENOUS

## 2018-11-22 MED ORDER — ALBUMIN HUMAN 5 % IV SOLN
INTRAVENOUS | Status: AC
  Filled 2018-11-22: qty 1000

## 2018-11-22 MED ORDER — SODIUM CHLORIDE 0.9 % IR SOLN
Status: DC | PRN
  Administered 2018-11-22: 3000 mL via INTRAVESICAL

## 2018-11-22 MED ORDER — TRAMADOL HCL 50 MG PO TABS: 50.0000 mg | ORAL_TABLET | Freq: Four times a day (QID) | ORAL | 0 refills | Status: DC | PRN

## 2018-11-22 MED ORDER — DEXAMETHASONE SODIUM PHOSPHATE 10 MG/ML IJ SOLN
INTRAMUSCULAR | Status: AC
  Filled 2018-11-22: qty 2

## 2018-11-22 MED ORDER — BUPIVACAINE LIPOSOME 1.3 % IJ SUSP
20.0000 mL | Freq: Once | INTRAMUSCULAR | Status: AC
  Administered 2018-11-22: 20 mL
  Filled 2018-11-22: qty 20

## 2018-11-22 MED ORDER — LACTATED RINGERS IV SOLN
INTRAVENOUS | Status: DC
  Administered 2018-11-22 (×4): via INTRAVENOUS

## 2018-11-22 MED ORDER — ROCURONIUM BROMIDE 10 MG/ML (PF) SYRINGE
PREFILLED_SYRINGE | INTRAVENOUS | Status: DC | PRN
  Administered 2018-11-22: 10 mg via INTRAVENOUS
  Administered 2018-11-22: 20 mg via INTRAVENOUS
  Administered 2018-11-22: 5 mg via INTRAVENOUS
  Administered 2018-11-22 (×2): 10 mg via INTRAVENOUS
  Administered 2018-11-22: 70 mg via INTRAVENOUS
  Administered 2018-11-22: 30 mg via INTRAVENOUS

## 2018-11-22 MED ORDER — SODIUM CHLORIDE 0.9 % IV SOLN
INTRAVENOUS | Status: DC | PRN
  Administered 2018-11-22: 15 ug/min via INTRAVENOUS

## 2018-11-22 MED ORDER — METHYLENE BLUE 0.5 % INJ SOLN
INTRAVENOUS | Status: AC
  Filled 2018-11-22: qty 10

## 2018-11-22 MED ORDER — HYDROMORPHONE HCL 1 MG/ML IJ SOLN: 0.5000 mg | INTRAMUSCULAR | Status: DC | PRN

## 2018-11-22 MED ORDER — EPHEDRINE 5 MG/ML INJ
INTRAVENOUS | Status: AC
  Filled 2018-11-22: qty 10

## 2018-11-22 MED ORDER — PROMETHAZINE HCL 25 MG/ML IJ SOLN
INTRAMUSCULAR | Status: AC
  Filled 2018-11-22: qty 1

## 2018-11-22 MED ORDER — IOHEXOL 300 MG/ML  SOLN
INTRAMUSCULAR | Status: DC | PRN
  Administered 2018-11-22: 20 mL via URETHRAL

## 2018-11-22 MED ORDER — SCOPOLAMINE 1 MG/3DAYS TD PT72
MEDICATED_PATCH | TRANSDERMAL | Status: AC
  Filled 2018-11-22: qty 1

## 2018-11-22 MED ORDER — LIDOCAINE 2% (20 MG/ML) 5 ML SYRINGE
INTRAMUSCULAR | Status: AC
  Filled 2018-11-22: qty 10

## 2018-11-22 MED ORDER — SCOPOLAMINE 1 MG/3DAYS TD PT72
MEDICATED_PATCH | TRANSDERMAL | Status: DC | PRN
  Administered 2018-11-22: 1 via TRANSDERMAL

## 2018-11-22 MED ORDER — ROCURONIUM BROMIDE 10 MG/ML (PF) SYRINGE
PREFILLED_SYRINGE | INTRAVENOUS | Status: AC
  Filled 2018-11-22: qty 20

## 2018-11-22 MED ORDER — INSULIN ASPART 100 UNIT/ML ~~LOC~~ SOLN
0.0000 [IU] | Freq: Three times a day (TID) | SUBCUTANEOUS | Status: DC
  Administered 2018-11-23: 3 [IU] via SUBCUTANEOUS
  Administered 2018-11-24 (×2): 2 [IU] via SUBCUTANEOUS

## 2018-11-22 MED ORDER — PHENYLEPHRINE 40 MCG/ML (10ML) SYRINGE FOR IV PUSH (FOR BLOOD PRESSURE SUPPORT)
PREFILLED_SYRINGE | INTRAVENOUS | Status: AC
  Filled 2018-11-22: qty 10

## 2018-11-22 MED ORDER — DEXAMETHASONE SODIUM PHOSPHATE 10 MG/ML IJ SOLN
INTRAMUSCULAR | Status: AC
  Filled 2018-11-22: qty 1

## 2018-11-22 MED ORDER — INSULIN ASPART 100 UNIT/ML ~~LOC~~ SOLN
6.0000 [IU] | Freq: Once | SUBCUTANEOUS | Status: AC
  Administered 2018-11-22: 6 [IU] via SUBCUTANEOUS

## 2018-11-22 MED ORDER — ACETAMINOPHEN 10 MG/ML IV SOLN
1000.0000 mg | Freq: Four times a day (QID) | INTRAVENOUS | Status: AC
  Administered 2018-11-22 – 2018-11-23 (×4): 1000 mg via INTRAVENOUS
  Filled 2018-11-22 (×4): qty 100

## 2018-11-22 MED ORDER — FENTANYL CITRATE (PF) 100 MCG/2ML IJ SOLN: 25.0000 ug | INTRAMUSCULAR | Status: DC | PRN

## 2018-11-22 MED ORDER — INDOCYANINE GREEN 25 MG IV SOLR
INTRAVENOUS | Status: DC | PRN
  Administered 2018-11-22: 25 mg

## 2018-11-22 MED ORDER — BELLADONNA ALKALOIDS-OPIUM 16.2-60 MG RE SUPP: 1.0000 | Freq: Four times a day (QID) | RECTAL | Status: DC | PRN

## 2018-11-22 MED ORDER — LIDOCAINE 2% (20 MG/ML) 5 ML SYRINGE
INTRAMUSCULAR | Status: DC | PRN
  Administered 2018-11-22: 100 mg via INTRAVENOUS

## 2018-11-22 MED ORDER — FENTANYL CITRATE (PF) 250 MCG/5ML IJ SOLN
INTRAMUSCULAR | Status: DC | PRN
  Administered 2018-11-22 (×10): 50 ug via INTRAVENOUS

## 2018-11-22 MED ORDER — EPHEDRINE SULFATE-NACL 50-0.9 MG/10ML-% IV SOSY
PREFILLED_SYRINGE | INTRAVENOUS | Status: DC | PRN
  Administered 2018-11-22: 10 mg via INTRAVENOUS

## 2018-11-22 MED ORDER — SODIUM CHLORIDE 0.9 % IV SOLN
1.0000 g | Freq: Once | INTRAVENOUS | Status: AC
  Administered 2018-11-22: 1 g via INTRAVENOUS
  Filled 2018-11-22: qty 1

## 2018-11-22 MED ORDER — STERILE WATER FOR INJECTION IJ SOLN
INTRAMUSCULAR | Status: AC
  Filled 2018-11-22: qty 10

## 2018-11-22 MED ORDER — SODIUM CHLORIDE 0.9 % IR SOLN: Status: DC | PRN

## 2018-11-22 MED ORDER — SODIUM CHLORIDE (PF) 0.9 % IJ SOLN
INTRAMUSCULAR | Status: DC | PRN
  Administered 2018-11-22: 17 mL

## 2018-11-22 SURGICAL SUPPLY — 83 items
ADH SKN CLS APL DERMABOND .7 (GAUZE/BANDAGES/DRESSINGS) ×1
APL SRG 38 LTWT LNG FL B (MISCELLANEOUS) ×1
APPLICATOR ARISTA FLEXITIP XL (MISCELLANEOUS) ×1 IMPLANT
BAG LAPAROSCOPIC 12 15 PORT 16 (BASKET) IMPLANT
BAG RETRIEVAL 12/15 (BASKET) ×2
BAG URO CATCHER STRL LF (MISCELLANEOUS) ×1 IMPLANT
CATH FOLEY 3WAY  5CC 16FR (CATHETERS)
CATH FOLEY 3WAY 5CC 16FR (CATHETERS) ×1 IMPLANT
CATH URET 5FR 28IN OPEN ENDED (CATHETERS) ×1 IMPLANT
CHLORAPREP W/TINT 26ML (MISCELLANEOUS) ×2 IMPLANT
CLIP VESOLOCK LG 6/CT PURPLE (CLIP) ×3 IMPLANT
CLIP VESOLOCK MED LG 6/CT (CLIP) ×1 IMPLANT
CLIP VESOLOCK XL 6/CT (CLIP) ×1 IMPLANT
COVER SURGICAL LIGHT HANDLE (MISCELLANEOUS) ×2 IMPLANT
COVER TIP SHEARS 8 DVNC (MISCELLANEOUS) ×1 IMPLANT
COVER TIP SHEARS 8MM DA VINCI (MISCELLANEOUS) ×1
COVER WAND RF STERILE (DRAPES) ×1 IMPLANT
DECANTER SPIKE VIAL GLASS SM (MISCELLANEOUS) ×2 IMPLANT
DERMABOND ADVANCED (GAUZE/BANDAGES/DRESSINGS) ×1
DERMABOND ADVANCED .7 DNX12 (GAUZE/BANDAGES/DRESSINGS) IMPLANT
DRAIN CHANNEL 15F RND FF 3/16 (WOUND CARE) ×1 IMPLANT
DRAPE ARM DVNC X/XI (DISPOSABLE) ×4 IMPLANT
DRAPE COLUMN DVNC XI (DISPOSABLE) ×1 IMPLANT
DRAPE DA VINCI XI ARM (DISPOSABLE) ×4
DRAPE DA VINCI XI COLUMN (DISPOSABLE) ×1
DRAPE INCISE IOBAN 66X45 STRL (DRAPES) ×2 IMPLANT
DRAPE SHEET LG 3/4 BI-LAMINATE (DRAPES) ×2 IMPLANT
DRSG TEGADERM 4X4.75 (GAUZE/BANDAGES/DRESSINGS) ×2 IMPLANT
ELECT PENCIL ROCKER SW 15FT (MISCELLANEOUS) ×2 IMPLANT
ELECT REM PT RETURN 15FT ADLT (MISCELLANEOUS) ×2 IMPLANT
EVACUATOR SILICONE 100CC (DRAIN) ×1 IMPLANT
GLOVE BIO SURGEON STRL SZ 6.5 (GLOVE) ×2 IMPLANT
GLOVE BIOGEL M STRL SZ7.5 (GLOVE) ×9 IMPLANT
GOWN STRL REUS W/TWL LRG LVL3 (GOWN DISPOSABLE) ×8 IMPLANT
GUIDEWIRE STR DUAL SENSOR (WIRE) IMPLANT
HEMOSTAT ARISTA ABSORB 3G PWDR (MISCELLANEOUS) ×1 IMPLANT
HOLDER FOLEY CATH W/STRAP (MISCELLANEOUS) ×1 IMPLANT
IRRIG SUCT STRYKERFLOW 2 WTIP (MISCELLANEOUS) ×2
IRRIGATION SUCT STRKRFLW 2 WTP (MISCELLANEOUS) IMPLANT
IV LACTATED RINGERS 1000ML (IV SOLUTION) ×1 IMPLANT
KIT BASIN OR (CUSTOM PROCEDURE TRAY) ×2 IMPLANT
KIT PROCEDURE DA VINCI SI (MISCELLANEOUS) ×1
KIT PROCEDURE DVNC SI (MISCELLANEOUS) IMPLANT
MARKER SKIN DUAL TIP RULER LAB (MISCELLANEOUS) ×1 IMPLANT
NDL INSUFFLATION 14GA 120MM (NEEDLE) ×1 IMPLANT
NEEDLE INSUFFLATION 14GA 120MM (NEEDLE) ×2 IMPLANT
NS IRRIG 1000ML POUR BTL (IV SOLUTION) ×1 IMPLANT
PACK CYSTO (CUSTOM PROCEDURE TRAY) ×1 IMPLANT
PAD POSITIONING PINK XL (MISCELLANEOUS) ×1 IMPLANT
PORT ACCESS TROCAR AIRSEAL 12 (TROCAR) IMPLANT
PORT ACCESS TROCAR AIRSEAL 5M (TROCAR) ×1
PROTECTOR NERVE ULNAR (MISCELLANEOUS) ×4 IMPLANT
RELOAD STAPLE 60 2.6 WHT THN (STAPLE) IMPLANT
RELOAD STAPLER WHITE 60MM (STAPLE) ×3 IMPLANT
SCISSORS LAP 5X45 EPIX DISP (ENDOMECHANICALS) ×1 IMPLANT
SEAL CANN UNIV 5-8 DVNC XI (MISCELLANEOUS) ×4 IMPLANT
SEAL XI 5MM-8MM UNIVERSAL (MISCELLANEOUS) ×4
SET IRRIG Y TYPE TUR BLADDER L (SET/KITS/TRAYS/PACK) ×1 IMPLANT
SET TRI-LUMEN FLTR TB AIRSEAL (TUBING) ×1 IMPLANT
SOLUTION ELECTROLUBE (MISCELLANEOUS) ×2 IMPLANT
SPONGE LAP 18X18 X RAY DECT (DISPOSABLE) ×1 IMPLANT
SPONGE LAP 4X18 RFD (DISPOSABLE) ×1 IMPLANT
STAPLER ECHELON LONG 60 440 (INSTRUMENTS) ×1 IMPLANT
STAPLER RELOAD WHITE 60MM (STAPLE) ×6
SUT ETHILON 3 0 PS 1 (SUTURE) ×2 IMPLANT
SUT MON AB 4-0 SH 27 (SUTURE) ×4 IMPLANT
SUT SILK 0 (SUTURE) ×2
SUT SILK 0 30XBRD TIE 6 (SUTURE) IMPLANT
SUT VIC AB 0 CT1 27 (SUTURE)
SUT VIC AB 0 CT1 27XBRD ANTBC (SUTURE) IMPLANT
SUT VIC AB 0 UR5 27 (SUTURE) IMPLANT
SUT VIC AB 2-0 CT1 27 (SUTURE) ×2
SUT VIC AB 2-0 CT1 27XBRD (SUTURE) IMPLANT
SUT VIC AB 4-0 RB1 27 (SUTURE) ×4
SUT VIC AB 4-0 RB1 27XBRD (SUTURE) IMPLANT
SUT VICRYL 0 UR6 27IN ABS (SUTURE) ×4 IMPLANT
TOWEL OR 17X26 10 PK STRL BLUE (TOWEL DISPOSABLE) ×1 IMPLANT
TOWEL OR NON WOVEN STRL DISP B (DISPOSABLE) ×2 IMPLANT
TRAY FOLEY MTR SLVR 16FR STAT (SET/KITS/TRAYS/PACK) ×2 IMPLANT
TRAY LAPAROSCOPIC (CUSTOM PROCEDURE TRAY) ×2 IMPLANT
TROCAR BLADELESS OPT 5 100 (ENDOMECHANICALS) ×1 IMPLANT
TUBING CONNECTING 10 (TUBING) ×1 IMPLANT
WATER STERILE IRR 1000ML POUR (IV SOLUTION) ×2 IMPLANT

## 2018-11-22 NOTE — H&P (Signed)
hydronephrosis.  HPI: Carol [name] is a 63 year-old female established patient who is here for hydronephrosis.  The problem is on the right side. She is not having pain. She is not currently having flank pain, back pain, groin pain, nausea, vomiting, fever or chills.   The patient has had recent labs. They had some abnormal labs, including BUN/Cr 22/0.8.   She has had previous abdominal surgery. She has had 2 surgeries. She has had a stent placed in her kidney. She has not received radiation therapy. The patient does not have a history of recurrent UTIs.   The patient had a Lasix renogram performed demonstrating 23% function of the right kidney. There was no T1/2 max reached after Lasix demonstrating a high-grade obstruction. Fortunately, the patient has had no symptoms.   The patient underwent ureteroscopy and laser lithotripsy of a stone that was obstructing proximal to the stricture the stricture was noted to be approximately 4 cm, just distal to the UPJ.   Interval: The patient presents today for further discussion of repair of that left proximal ureter. Since she was last seen she has not had any ongoing flank pain. She denies any hematuria. She denies any dysuria.   The patient has a past medical history significant for obesity and diverticulitis. She has had her gallbladder removed and a left partial nephrectomy.     ALLERGIES: No Allergies    MEDICATIONS: Metformin Hcl  Atorvastatin Calcium 10 mg tablet Oral  Furosemide 20 mg tablet Oral  Levothyroxine Sodium 75 mcg tablet Oral     GU PSH: Cysto Remove Stent FB Sim - 2018 Cysto Uretero Balloon Dil Strict - 2018 Cystoscopy Insert Stent - 2018 Locm 300-399Mg /Ml Iodine,1Ml - 09/11/2018, 09/10/2017, 2018, 2017 Partial nephrectomy (laparoscopic) - 2016 Ureteroscopic stone removal - 2018      PSH Notes: Kidney Surgery Laparoscopic Partial Nephrectomy, Cholecystectomy, Knee Surgery Left, Shoulder Surgery Left   NON-GU PSH:  Cholecystectomy (open) - 2016    GU PMH: Ureteral stricture - 09/30/2018 Renal cell carcinoma, left - 09/17/2017, - 2017, Renal cell carcinoma of left kidney, - 2017 Ureteral calculus - 2018 Renal and ureteral calculus - 2018 Abdominal Pain Unspec, Abdominal pain - 2016 Neoplasm of unspecified behavior of unspecified kidney, Renal neoplasm - 2016    NON-GU PMH: Encounter for general adult medical examination without abnormal findings, Encounter for preventive health examination - 2017 Diverticulitis of intestine, part unspecified, without perforation or abscess without bleeding, Diverticulitis - 2016 Personal history of other diseases of the digestive system, History of esophageal reflux - 2016 Personal history of other diseases of the nervous system and sense organs, History of sleep apnea - 2016 Personal history of other endocrine, nutritional and metabolic disease, History of hypercholesterolemia - 2016, History of diabetes mellitus, - 2016, History of hypothyroidism, - 2016    FAMILY HISTORY: malignant neoplasm of urinary bladder - Runs In Family   SOCIAL HISTORY: Marital Status: Married Preferred Language: English; Ethnicity: Not Hispanic Or Latino; Race: White     Notes: Caffeine use, Occupation, Number of children, Never a smoker, Mother deceased, Alcohol use, Married   REVIEW OF SYSTEMS:    GU Review Female:   Patient reports frequent urination. Patient denies hard to postpone urination, burning /pain with urination, get up at night to urinate, leakage of urine, stream starts and stops, trouble starting your stream, have to strain to urinate, and being pregnant.  Gastrointestinal (Upper):   Patient denies nausea, vomiting, and indigestion/ heartburn.  Gastrointestinal (Lower):  Patient denies diarrhea and constipation.  Constitutional:   Patient denies fever, night sweats, weight loss, and fatigue.  Skin:   Patient denies skin rash/ lesion and itching.  Eyes:   Patient denies  blurred vision and double vision.  Ears/ Nose/ Throat:   Patient denies sore throat and sinus problems.  Hematologic/Lymphatic:   Patient denies swollen glands and easy bruising.  Cardiovascular:   Patient denies leg swelling and chest pains.  Respiratory:   Patient denies cough and shortness of breath.  Endocrine:   Patient denies excessive thirst.  Musculoskeletal:   Patient denies back pain and joint pain.  Neurological:   Patient denies headaches and dizziness.  Psychologic:   Patient denies depression and anxiety.   Notes: pt states she gets frequent urination when taking furosemide.    VITAL SIGNS:      10/29/2018 03:44 PM  Weight 223 lb / 101.15 kg  Height 62 in / 157.48 cm  BP 123/78 mmHg  Heart Rate 75 /min  BMI 40.8 kg/m   MULTI-SYSTEM PHYSICAL EXAMINATION:    Constitutional: Well-nourished. No physical deformities. Normally developed. Good grooming.  Respiratory: Normal breath sounds. No labored breathing, no use of accessory muscles.   Cardiovascular: Regular rate and rhythm. No murmur, no gallop. Normal temperature, normal extremity pulses, no swelling, no varicosities.   Gastrointestinal: No mass, no tenderness, no rigidity, non obese abdomen. The patient has a small umbilical hernia, her scars in the left lateral abdomen are well healed.     PAST DATA REVIEWED:  Source Of History:  Patient  Records Review:   Previous Doctor Records, Previous Patient Records, POC Tool  X-Ray Review: C.T. Abdomen/Pelvis: Reviewed Films. Discussed With Patient.     PROCEDURES: None   ASSESSMENT:      ICD-10 Details  1 GU:   Ureteral stricture - N13.5   2   Renal and ureteral calculus - N20.2    PLAN:           Document Letter(s):  Created for Patient: Clinical Summary         Notes:   The patient has a history of low left renal cell carcinoma and is status post partial nephrectomy. She has no evidence of disease. She does have what appears to be an ischemic stricture of the  proximal ureter. We discussed the treatment options and our plan is to perform a by or a flap of the renal pelvis to jump the strictured segment. This will likely require nephropexy. We also have the option of performing a buccal graft repair. I went through the surgery with the patient in detail. I described for her all the steps and the associated risk and benefits. We discussed the potential hospital course which would be at least 1 night but up to 3 or 4. She understands she will have a Foley catheter, stent, and a drain postoperatively. We will try to remove the drain in the Foley catheter prior to her discharge, but she will need the stent for 3 or 4 weeks. The patient understands the biggest risk years failure. Just stone understands the inherent risks of surgery.   We will try to get this scheduled for the patient as soon as possible.

## 2018-11-22 NOTE — Op Note (Signed)
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 Cystoscopy, left retrograde pyelogram with interpretation 2.  instillation of firefly in the left ureter retrograde 3.   Extensive lysis of adhesions requiring several hours of surgery time 4.  robotic assisted left laparoscopic nephrectomy  5.   Periumbilical hernia repair  Surgeon: Louis Meckel,  M.D.  Assistant(s):  Debbrah Alar, PA  Anesthesia: General  Complications:  After dissecting out the kidney with extreme difficulty given the adhesions and the association of the kidney and the colon and recognizing that the significant edema and inflammation within the collecting system and the scar tissue surrounding the proximal ureter it was clear to me that  A ureteral stricture repair was not possible.  I went to speak with the patient's husband about whether to proceed with nephrectomy or to close up the patient's abdomen and we together decided to proceed with nephrectomy.  EBL:   One thousand mL   Specimens: 1.  left kidney and proximal ureter  Intraoperative findings:  1.:  A retrograde pyelogram was performed using 10 cc of Omnipaque contrast.  This demonstrated a normal caliber ureter up till the very proximal ureter which was very narrowed and lead into a very dilated and blunted collecting system. 2.:  The patient had extensive adhesions on the anterior abdominal wall requiring care to place the ports in the appropriate location.  We had to take down most of these adhesions that were along the line of the ports before we could get all of the ports placed in the appropriate location.  There was extensive adhesions of the colon to the anterior abdominal wall as well as the kidney requiring extended amount of time of dissection removing the adhesions prior to being able to make a decision as to whether to proceed with the  pyeloplasty. 3.:  Given the extent of the scar tissue it was clear that I was not going to be able to fix the strictured ureter, and as such a nephrectomy was performed after consultation with the patient's husband.   4. Colon general surgery was consulted intraoperatively to evaluate the transverse and descending colon for enterotomies given the extensive dissection performed.  Dr. Ninfa Linden  Did evaluate the bowel laparoscopically and deemed it clean of enterotomies.  5.  The extraction was performed at the midline around the umbilicus so that her periumbilical hernia could be repaired simultaneously.  Drains:  1. 16 Fr Foley catheter  Indication: Carol [name] is a 63 y.o. patient with a suspected left ureteropelvic junction obstruction.  After a thorough review of the management options for their ureteropelvic junction obstruction, they elected to proceed with surgical treatment and the above procedure.  We have discussed the potential benefits and risks of the procedure, side effects of the proposed treatment, the likelihood of the patient achieving the goals of the procedure, and any potential problems that might occur during the procedure or recuperation. Informed consent has been obtained.  Description of procedure: The patient was taken to the operating room and a general anesthetic was administered. The patient was given preoperative antibiotics, placed in  Dorsal lithotomy position and prepped and draped in the routine sterile fashion.  A time-out was then performed for cystoscopy and retrograde pyelogram.  Then using a 21 French 30 degree cystoscope by gently passed through the patient's urethra and into the bladder under visual guidance.  Cystoscopy demonstrated a normal bladder with no mucosa or abnormalities.  The ureters were orthotopic.  A 5 French open-ended catheter was then used to perform retrograde pyelogram using 10 cc of Omnipaque contrast.  The above findings were noted.  I then  instilled 10 cc of fiber fly contrast.  I then advanced the catheter up to these distal end of the stricture and removed the scope leaving the catheter behind.  I secured the catheter to the Foley catheter which I placed this time.    The patient was then placed in the left modified flank position and prepped and draped in the usual sterile fashion. Next a preoperative timeout was performed.     A site was selected on the left side of the umbilicus for placement of the camera port. This was placed using a standard open Hassan technique which allowed entry into the peritoneal cavity under direct vision and without difficulty. A 8 mm port was placed and a pneumoperitoneum established. The camera was then used to inspect the abdomen and there was no evidence of any intra-abdominal injuries or other abnormalities. The remaining abdominal ports were then placed. 8 mm robotic ports were placed in the ipsilateral upper quadrant, lower quadrant, and far lateral abdominal wall. A 12 mm port was placed in the upper midline for laparoscopic assistance. We were able to get and the camera port safely and then the most lateral port on the lower abdomen under visual guidance.  At this time we then performed an extensive lysis of adhesions to remove the adhesions of the omentum in the colon from the anterior abdominal wall.  Ultimately, we were able to get all the ports placed safely. All ports were placed under direct vision. The surgical cart was then docked.   Utilizing the cautery scissors,   I then proceeded to lyse the adhesions of the transverse and descending colon off of the anterior abdominal wall and was able to eventually find the plane between the mesocolon and the anterior layer of Gerota's fascia to be developed and the kidney exposed.  The renal pelvis was noted to be quite large and I was able to follow this down to the lower pole region of kidney.  Here the colon was very stuck to the ureter and the  surrounding structures.  The colon was very densely adherent to the UPJ segment and the proximal ureter.  I was able to dissect the colon away from the UPJ, but was never really able to get the exposure of the proximal ureter because of the dense adhesions.   At this juncture we had been operating from [place] 4-1/2 hours.  I realize that the extensive amount of adhesions and dense scar and fibrous tissue  Were going to prevent the stricture repair from being successful.  I did open up the renal pelvis and the thickness of the tissue was quite surprising.  It became very clear to me that I was not going to be able to perform a a renal pelvic flap to jump to the strictured segment because I did not think I would be able to tubularized such thickened urothelium.  I then stopped the surgery and went to speak to the patient's husband and we agreed to proceed with radical nephrectomy.    I also spoke with Dr. Tresa Moore, 1 of my partners who concurred that this surgery was best performed as a radical nephrectomy.   I then used the 4th arm to lift the kidney as best as possible up to the anterior wall allowing dissection of the renal  hilum.  I encountered 2 small arteries and 1 large vein.  The arteries were taken with 2 clips down and 1 up prior to them being ligated.  The vein was taken with endovascular stapler.  The adrenal gland was spared.  Dissection then ensued to free the kidney from the surrounding attachments.  Once again down in the lower aspect where the ureter and the UPJ were this was very difficult to see and navigate.  Ultimately, I was able to navigate through the scar tissue without performing any enterotomies.  This was confirmed by Dr. Ninfa Linden of General surgery.  I then placed the kidney in a Endo-Catch bag through the assistant port.  I then ensured that there was no ongoing vascular bleeding around the hilum.  All the robotic ports were then removed and the robotic cart was her taken away from the  table.  We then took the patient out of the kidney flex and air planed her more horizontal and did a lower midline periumbilical incision to extract the specimen.  This was made down through the fascia and the hernia sac was identified and resected.  The anterior abdominal wall was then closed with a looped PDS in a running fashion starting at each end.  The deep dermal layer was reapproximated with Vicryl.  The skin was closed with a 4 0 Monocryl.  The assistant port was closed with a 0 Vicryl at the fascial level.  All the other ports were closed with 4-0 Monocryl. ALl port sites were injected with 0.25% bupivicaine and Dermabond was applied to the skin.  The patient appeared to tolerate the procedure well and without complications.  The patient was able to be extubated and transferred to the recovery unit in satisfactory condition.

## 2018-11-22 NOTE — Anesthesia Procedure Notes (Signed)
Date/Time: 11/22/2018 3:26 PM Performed by: Cynda Familia, CRNA Oxygen Delivery Method: Simple face mask Placement Confirmation: positive ETCO2 and breath sounds checked- equal and bilateral Dental Injury: Teeth and Oropharynx as per pre-operative assessment

## 2018-11-22 NOTE — Transfer of Care (Signed)
Immediate Anesthesia Transfer of Care Note  Patient: Carol [name]  Procedure(s) Performed: XI ROBOTIC ASSISTED ATTEMPTED PYELOPLASTY CONVERTED TO NEPHRECTOMY/LYSIS OF ADHESIONS/UMBILICAL HERNIA REPAIR (Left ) CYSTOSCOPY WITH RETROGRADE PYELOGRAM (Left )  Patient Location: PACU  Anesthesia Type:General  Level of Consciousness: sedated  Airway & Oxygen Therapy: Patient Spontanous Breathing and Patient connected to face mask oxygen  Post-op Assessment: Report given to RN and Post -op Vital signs reviewed and stable  Post vital signs: Reviewed and stable  Last Vitals:  Vitals Value Taken Time  BP 147/76 11/22/2018  3:45 PM  Temp    Pulse 100 11/22/2018  3:55 PM  Resp 23 11/22/2018  3:55 PM  SpO2 100 % 11/22/2018  3:55 PM  Vitals shown include unvalidated device data.  Last Pain:  Vitals:   11/22/18 1532  TempSrc:   PainSc: 0-No pain      Patients Stated Pain Goal: 4 (63/33/54 [id number])  Complications: No apparent anesthesia complications

## 2018-11-22 NOTE — Discharge Instructions (Signed)

## 2018-11-22 NOTE — Anesthesia Postprocedure Evaluation (Signed)
Anesthesia Post Note  Patient: ERLA BACCHI  Procedure(s) Performed: XI ROBOTIC ASSISTED ATTEMPTED PYELOPLASTY CONVERTED TO NEPHRECTOMY/LYSIS OF ADHESIONS/UMBILICAL HERNIA REPAIR (Left ) CYSTOSCOPY WITH RETROGRADE PYELOGRAM (Left )     Patient location during evaluation: PACU Anesthesia Type: General Level of consciousness: awake and alert Pain management: pain level controlled Vital Signs Assessment: post-procedure vital signs reviewed and stable Respiratory status: spontaneous breathing, nonlabored ventilation and respiratory function stable Cardiovascular status: blood pressure returned to baseline and stable Postop Assessment: no apparent nausea or vomiting Anesthetic complications: no    Last Vitals:  Vitals:   11/22/18 1615 11/22/18 1640  BP: 137/80 139/72  Pulse: 91 77  Resp: 19 16  Temp:  37 C  SpO2: 97% 100%    Last Pain:  Vitals:   11/22/18 1645  TempSrc:   PainSc: Asleep                 Lidia Collum

## 2018-11-22 NOTE — Anesthesia Procedure Notes (Signed)
Procedure Name: Intubation Date/Time: 11/22/2018 7:42 AM Performed by: Cynda Familia, CRNA Pre-anesthesia Checklist: Patient identified, Emergency Drugs available, Suction available and Patient being monitored Patient Re-evaluated:Patient Re-evaluated prior to induction Oxygen Delivery Method: Circle System Utilized Preoxygenation: Pre-oxygenation with 100% oxygen Induction Type: IV induction Ventilation: Mask ventilation without difficulty Laryngoscope Size: Miller and 2 Grade View: Grade I Tube type: Oral Tube size: 7.0 mm Number of attempts: 1 Airway Equipment and Method: Stylet Placement Confirmation: ETT inserted through vocal cords under direct vision,  positive ETCO2 and breath sounds checked- equal and bilateral Secured at: 21 cm Tube secured with: Tape Dental Injury: Teeth and Oropharynx as per pre-operative assessment  Comments: Smooth Iv induction-- Tobias Alexander-- intubation AM CRNA atraumatic-- chipped teeth upper front left-- unchanged after larnygoscopy --- bilat BS Tobias Alexander

## 2018-11-23 ENCOUNTER — Encounter (HOSPITAL_COMMUNITY): Payer: Self-pay | Admitting: Urology

## 2018-11-23 LAB — BASIC METABOLIC PANEL
Anion gap: 10 (ref 5–15)
BUN: 13 mg/dL (ref 8–23)
CO2: 23 mmol/L (ref 22–32)
Calcium: 7.9 mg/dL — ABNORMAL LOW (ref 8.9–10.3)
Chloride: 103 mmol/L (ref 98–111)
Creatinine, Ser: 1.08 mg/dL — ABNORMAL HIGH (ref 0.44–1.00)
GFR calc Af Amer: >60 mL/min (ref >60)
GFR calc non Af Amer: 55 mL/min — ABNORMAL LOW (ref >60)
GLUCOSE: 181 mg/dL — AB (ref 70–99)
Potassium: 4.1 mmol/L (ref 3.5–5.1)
Sodium: 136 mmol/L (ref 135–145)

## 2018-11-23 LAB — GLUCOSE, CAPILLARY
GLUCOSE-CAPILLARY: 149 mg/dL — AB (ref 70–99)
Glucose-Capillary: 163 mg/dL — ABNORMAL HIGH (ref 70–99)
Glucose-Capillary: 193 mg/dL — ABNORMAL HIGH (ref 70–99)
Glucose-Capillary: 113 mg/dL — ABNORMAL HIGH (ref 70–99)

## 2018-11-23 LAB — HEMOGLOBIN AND HEMATOCRIT, BLOOD
HCT: 26.9 % — ABNORMAL LOW (ref 36.0–46.0)
Hemoglobin: 9.1 g/dL — ABNORMAL LOW (ref 12.0–15.0)

## 2018-11-23 NOTE — Progress Notes (Signed)
Patient's foley catheter removed today at 0850.  Patient has ambulated on unit twice.  Tolerated well.  Patient has voided x1.

## 2018-11-24 ENCOUNTER — Inpatient Hospital Stay (HOSPITAL_COMMUNITY): Payer: BLUE CROSS/BLUE SHIELD

## 2018-11-24 LAB — GLUCOSE, CAPILLARY
Glucose-Capillary: 148 mg/dL — ABNORMAL HIGH (ref 70–99)
Glucose-Capillary: 120 mg/dL — ABNORMAL HIGH (ref 70–99)
Glucose-Capillary: 128 mg/dL — ABNORMAL HIGH (ref 70–99)
Glucose-Capillary: 101 mg/dL — ABNORMAL HIGH (ref 70–99)

## 2018-11-24 LAB — BASIC METABOLIC PANEL
Anion gap: 8 (ref 5–15)
BUN: 11 mg/dL (ref 8–23)
CO2: 24 mmol/L (ref 22–32)
Calcium: 8.1 mg/dL — ABNORMAL LOW (ref 8.9–10.3)
Chloride: 105 mmol/L (ref 98–111)
Creatinine, Ser: 1.1 mg/dL — ABNORMAL HIGH (ref 0.44–1.00)
GFR calc Af Amer: >60 mL/min (ref >60)
GFR calc non Af Amer: 53 mL/min — ABNORMAL LOW (ref >60)
Glucose, Bld: 163 mg/dL — ABNORMAL HIGH (ref 70–99)
Potassium: 4.3 mmol/L (ref 3.5–5.1)
Sodium: 137 mmol/L (ref 135–145)

## 2018-11-24 LAB — HEMOGLOBIN AND HEMATOCRIT, BLOOD
HCT: 24.2 % — ABNORMAL LOW (ref 36.0–46.0)
HEMOGLOBIN: 8 g/dL — AB (ref 12.0–15.0)

## 2018-11-24 NOTE — Progress Notes (Signed)
   Subjective/Chief Complaint:  1 - Left Ureteral Obstruction  - s/p LEFT robotic radical nephrectomy with umbilical hernia repair 04/25/4402 for proximal ureteral obstruction in setting of prior left renal surgeries and severe left peri-renal inflamation.   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 Baselien overall GFR normal.   Today "Carol [name]" is improving. Walked some. Catheter still in .    Objective: Vital signs in last 24 hours: Temp:  [98.2 F (36.8 C)-99.9 F (37.7 C)] 99.1 F (37.3 C) (01/05 1325) Pulse Rate:  [75-102] 102 (01/05 1325) Resp:  [18] 18 (01/05 1325) BP: (108-131)/(50-60) 131/60 (01/05 1325) SpO2:  [91 %-99 %] 92 % (01/05 1325) Last BM Date: 11/21/18  Intake/Output from previous day: 01/04 0701 - 01/05 0700 In: 340 [P.O.:240; IV Piggyback:100] Out: 1500 [Urine:1500] Intake/Output this shift: Total I/O In: 360 [P.O.:360] Out: -   General appearance: alert, cooperative, appears stated age and family at bedside Eyes: negative Nose: Nares normal. Septum midline. Mucosa normal. No drainage or sinus tenderness. Throat: lips, mucosa, and tongue normal; teeth and gums normal Neck: supple, symmetrical, trachea midline Back: symmetric, no curvature. ROM normal. No CVA tenderness. Resp: non-labored on minimal [place] O2 Cardio: Nl rate GI: soft, non-tender; bowel sounds normal; no masses,  no organomegaly and large truncal obesity.  Pelvic: external genitalia normal and foley in place with non-foul yellow urine.  Extremities: extremities normal, atraumatic, no cyanosis or edema Lymph nodes: Cervical, supraclavicular, and axillary nodes normal. Neurologic: Grossly normal Incision/Wound: recent port / exraction sites c/d/i, no erythema / drainage.   Lab Results:  Recent Labs    11/23/18 0754 11/24/18 0528  HGB 9.1* 8.0*  HCT 26.9* 24.2*   BMET Recent Labs    11/23/18 0754 11/24/18 0528  NA 136 137  K 4.1 4.3  CL 103 105  CO2 23 24   GLUCOSE 181* 163*  BUN 13 11  CREATININE 1.08* 1.10*  CALCIUM 7.9* 8.1*   PT/INR No results for input(s): LABPROT, INR in the last 72 hours. ABG No results for input(s): PHART, HCO3 in the last 72 hours.  Invalid input(s): PCO2, PO2  Studies/Results: No results found.  Anti-infectives: Anti-infectives (From admission, onward)   Start     Dose/Rate Route Frequency Ordered Stop   11/22/18 2100  cefTRIAXone (ROCEPHIN) 1 g in sodium chloride 0.9 % 100 mL IVPB     1 g 200 mL/hr over 30 Minutes Intravenous  Once 11/22/18 1723 11/22/18 2114   11/22/18 0716  ceFAZolin (ANCEF) 2-4 GM/100ML-% IVPB    Note to Pharmacy:  Octavio Graves   : cabinet override      11/22/18 0716 11/22/18 1929      Assessment/Plan:  Doing well POD 1. DC foley, saline lock, encourage IS. Remain in house.

## 2018-11-24 NOTE — Progress Notes (Signed)
2 Days Post-Op   Subjective/Chief Complaint:  1 - Left Ureteral Obstruction  - s/p LEFT robotic radical nephrectomy with umbilical hernia repair 05/27/6766 for proximal ureteral obstruction in setting of prior left renal surgeries and severe left peri-renal inflamation.   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 Baselien overall GFR normal. Cr post-op <1.5.    3 - Anemia - Hgb 8's after nephrectomy. NO CP/SOB/Orthostasis.   4 - Low Grade Fevers - low grade fevers 99-100 after nephrectomy. Surgery quite complex with ahesiolysis. NO cough / wound driange / dysuria. She is having flatus.   Today "Carol [name]" is stable. Walking and compliant with IS up to 1347mL volume. Voiding w/o foley. Still with some low grade fever and heart rate up some. Tollerating PO.    Objective: Vital signs in last 24 hours: Temp:  [98.2 F (36.8 C)-99.9 F (37.7 C)] 99.1 F (37.3 C) (01/05 1325) Pulse Rate:  [75-102] 102 (01/05 1325) Resp:  [18] 18 (01/05 1325) BP: (108-131)/(50-60) 131/60 (01/05 1325) SpO2:  [91 %-99 %] 92 % (01/05 1325) Last BM Date: 11/21/18  Intake/Output from previous day: 01/04 0701 - 01/05 0700 In: 340 [P.O.:240; IV Piggyback:100] Out: 1500 [Urine:1500] Intake/Output this shift: Total I/O In: 360 [P.O.:360] Out: -   General appearance: alert, cooperative, appears stated age and family at bedside Eyes: negative Nose: Nares normal. Septum midline. Mucosa normal. No drainage or sinus tenderness. Throat: lips, mucosa, and tongue normal; teeth and gums normal Neck: supple, symmetrical, trachea midline Back: symmetric, no curvature. ROM normal. No CVA tenderness. Resp: non-labored on minimal [place] O2 Cardio: Nl rate GI: soft, non-tender; bowel sounds normal; no masses,  no organomegaly and large truncal obesity.  Extremities: extremities normal, atraumatic, no cyanosis or edema Lymph nodes: Cervical, supraclavicular, and axillary nodes normal. Neurologic: Grossly  normal Incision/Wound: recent port / exraction sites c/d/i, no erythema / drainage.   Lab Results:  Recent Labs    11/23/18 0754 11/24/18 0528  HGB 9.1* 8.0*  HCT 26.9* 24.2*   BMET Recent Labs    11/23/18 0754 11/24/18 0528  NA 136 137  K 4.1 4.3  CL 103 105  CO2 23 24  GLUCOSE 181* 163*  BUN 13 11  CREATININE 1.08* 1.10*  CALCIUM 7.9* 8.1*   PT/INR No results for input(s): LABPROT, INR in the last 72 hours. ABG No results for input(s): PHART, HCO3 in the last 72 hours.  Invalid input(s): PCO2, PO2  Studies/Results: No results found.  Anti-infectives: Anti-infectives (From admission, onward)   Start     Dose/Rate Route Frequency Ordered Stop   11/22/18 2100  cefTRIAXone (ROCEPHIN) 1 g in sodium chloride 0.9 % 100 mL IVPB     1 g 200 mL/hr over 30 Minutes Intravenous  Once 11/22/18 1723 11/22/18 2114   11/22/18 0716  ceFAZolin (ANCEF) 2-4 GM/100ML-% IVPB    Note to Pharmacy:  Octavio Graves   : cabinet override      11/22/18 0716 11/22/18 1929      Assessment/Plan:  1 - Left Ureteral Obstruction  - no further therapy s/p nephrectomy.  2 - Solitary Right Kidney - GFR remains excellent.   3 - Anemia - recheck Hgb tomorrow AM, high thrshold for transfusion as she is not symptomatic.    4 - Low Grade Fevers - Likely atelectasis, but to be cautious, UCX, CXR today. Will consider BCX and start ABX if fever becomes high grade.    Alexis Frock 11/24/2018

## 2018-11-24 NOTE — Progress Notes (Signed)
I assumed care of this pt at 1300 and I agree with the assessment done by the previous nurse.

## 2018-11-25 LAB — BASIC METABOLIC PANEL
Anion gap: 8 (ref 5–15)
BUN: 11 mg/dL (ref 8–23)
CALCIUM: 8.5 mg/dL — AB (ref 8.9–10.3)
CO2: 24 mmol/L (ref 22–32)
CREATININE: 1.01 mg/dL — AB (ref 0.44–1.00)
Chloride: 108 mmol/L (ref 98–111)
GFR calc Af Amer: >60 mL/min (ref >60)
GFR calc non Af Amer: 59 mL/min — ABNORMAL LOW (ref >60)
Glucose, Bld: 124 mg/dL — ABNORMAL HIGH (ref 70–99)
Potassium: 4 mmol/L (ref 3.5–5.1)
Sodium: 140 mmol/L (ref 135–145)

## 2018-11-25 LAB — CBC
HCT: 25 % — ABNORMAL LOW (ref 36.0–46.0)
Hemoglobin: 8.2 g/dL — ABNORMAL LOW (ref 12.0–15.0)
MCH: 31.4 pg (ref 26.0–34.0)
MCHC: 32.8 g/dL (ref 30.0–36.0)
MCV: 95.8 fL (ref 80.0–100.0)
Platelets: 131 10*3/uL — ABNORMAL LOW (ref 150–400)
RBC: 2.61 MIL/uL — ABNORMAL LOW (ref 3.87–5.11)
RDW: 13.2 % (ref 11.5–15.5)
WBC: 5.9 10*3/uL (ref 4.0–10.5)
nRBC: 0 % (ref 0.0–0.2)

## 2018-11-25 LAB — GLUCOSE, CAPILLARY: Glucose-Capillary: 104 mg/dL — ABNORMAL HIGH (ref 70–99)

## 2018-11-25 NOTE — Discharge Summary (Signed)
[facility] Discharge Summary  Admit date: 11/22/2018  Discharge date and time: 11/25/18   Discharge to: Home  Discharge Service: Urology  Discharge Attending Physician:  Burman Nieves, MD  Discharge  Diagnoses: <principal problem not specified>  Secondary Diagnosis: Active Problems:   UPJ (ureteropelvic junction) obstruction   OR Procedures: Procedure(s): XI ROBOTIC ASSISTED ATTEMPTED PYELOPLASTY CONVERTED TO NEPHRECTOMY/LYSIS OF ADHESIONS/UMBILICAL HERNIA REPAIR CYSTOSCOPY WITH RETROGRADE PYELOGRAM 11/22/2018   Ancillary Procedures: None   Discharge Day Services: The patient was seen and examined by the Urology team both in the morning and immediately prior to discharge.  Vital signs and laboratory values were stable and within normal limits.  The physical exam was benign and unchanged and all surgical wounds were examined.  Discharge instructions were explained and all questions answered.  Subjective  No acute events overnight. Pain Controlled. No fever or chills.  Objective Patient Vitals for the past 8 hrs:  BP Temp Temp src Pulse Resp SpO2  11/25/18 0616 116/63 98.6 F (37 C) Oral 78 18 97 %   No intake/output data recorded.  General Appearance:        No acute distress Lungs:                      Normal work of breathing on room air Heart:                                Regular rate and rhythm Abdomen:                          Soft, appropriately tender, non-distended, incisions c/d/i Extremities:                       Warm and well perfused   Hospital Course:  The patient underwent left robotic nephrectomy (aborted left pyeloplasty) and umbilical hernia repair on 11/22/2018.  The patient tolerated the procedure well, was extubated in the OR, and afterwards was taken to the PACU for routine post-surgical care. When stable the patient was transferred to the floor.   The patient did well postoperatively.  The patient's diet was slowly advanced and at the time of  discharge was tolerating a regular diet.  The patient was discharged home 3 Days Post-Op, at which point was tolerating a regular solid diet, was able to void spontaneously, have adequate pain control with P.O. pain medication, and could ambulate without difficulty. The patient will follow up with [place] for post op check.   Condition at Discharge: Improved  Discharge Medications:  Allergies as of 11/25/2018   No Known Allergies     Medication List    TAKE these medications   atorvastatin 10 MG tablet Commonly known as:  LIPITOR Take 10 mg by mouth at bedtime.   furosemide 20 MG tablet Commonly known as:  LASIX Take 20 mg by mouth every morning.   levothyroxine 75 MCG tablet Commonly known as:  SYNTHROID, LEVOTHROID Take 75 mcg by mouth daily before breakfast.   metFORMIN 1000 MG tablet Commonly known as:  GLUCOPHAGE Take 1,000 mg by mouth daily with breakfast.   phenazopyridine 200 MG tablet Commonly known as:  PYRIDIUM Take 1 tablet (200 mg total) by mouth 3 (three) times daily as needed for pain.   PROAIR HFA 108 (90 Base) MCG/ACT inhaler Generic drug:  albuterol Inhale 2 puffs into the lungs every  4 (four) hours as needed for wheezing or shortness of breath. Uses seasonal   rOPINIRole 0.5 MG tablet Commonly known as:  REQUIP TAKE 1/2 TABLET FOR 2 NIGHTS THEN INCREASE TO 1 TABLET NIGHTLY FOR 1 WEEK MAY INCREASE TO 2 TABLETS   traMADol 50 MG tablet Commonly known as:  ULTRAM Take 1-2 tablets (50-100 mg total) by mouth every 6 (six) hours as needed for moderate pain.

## 2018-11-26 LAB — TYPE AND SCREEN
ABO/RH(D): O NEG
Antibody Screen: NEGATIVE
Unit division: 0
Unit division: 0

## 2018-11-26 LAB — BPAM RBC
Blood Product Expiration Date: 202001272359
Blood Product Expiration Date: 202002062359
ISSUE DATE / TIME: 202001031338
ISSUE DATE / TIME: 202001031338
UNIT TYPE AND RH: 9500
Unit Type and Rh: 9500

## 2018-11-26 LAB — URINE CULTURE
Culture: NO GROWTH
Special Requests: NORMAL

## 2019-04-29 ENCOUNTER — Other Ambulatory Visit: Payer: Self-pay | Admitting: Pediatric Intensive Care

## 2019-04-29 DIAGNOSIS — Z20822 Contact with and (suspected) exposure to covid-19: Secondary | ICD-10-CM

## 2019-04-29 NOTE — Progress Notes (Signed)
lab

## 2019-05-01 ENCOUNTER — Telehealth: Payer: Self-pay

## 2019-05-01 LAB — NOVEL CORONAVIRUS, NAA: SARS-CoV-2, NAA: NOT DETECTED

## 2019-05-01 NOTE — Telephone Encounter (Signed)
Patient called for covid test results, results given as noted not detected which means negative and she was not infected with the Novel Coronavirus, patient verbalized understanding and says she feels great.

## 2019-06-24 ENCOUNTER — Telehealth: Payer: Self-pay | Admitting: Internal Medicine

## 2019-06-24 NOTE — Telephone Encounter (Signed)
06-24-19 lm for recalls [facility] and [facility])

## 2021-07-29 ENCOUNTER — Other Ambulatory Visit: Payer: Self-pay

## 2021-07-29 ENCOUNTER — Other Ambulatory Visit (HOSPITAL_BASED_OUTPATIENT_CLINIC_OR_DEPARTMENT_OTHER): Payer: Self-pay

## 2021-07-29 ENCOUNTER — Emergency Department (HOSPITAL_BASED_OUTPATIENT_CLINIC_OR_DEPARTMENT_OTHER)
Admission: EM | Admit: 2021-07-29 | Discharge: 2021-07-29 | Disposition: A | Discharge status: Home / Self Care | Payer: BC Managed Care – PPO | Attending: Emergency Medicine | Admitting: Emergency Medicine

## 2021-07-29 ENCOUNTER — Emergency Department (HOSPITAL_BASED_OUTPATIENT_CLINIC_OR_DEPARTMENT_OTHER): Payer: BC Managed Care – PPO

## 2021-07-29 ENCOUNTER — Encounter (HOSPITAL_BASED_OUTPATIENT_CLINIC_OR_DEPARTMENT_OTHER): Payer: Self-pay | Admitting: Emergency Medicine

## 2021-07-29 DIAGNOSIS — Z85528 Personal history of other malignant neoplasm of kidney: Secondary | ICD-10-CM | POA: Insufficient documentation

## 2021-07-29 DIAGNOSIS — M542 Cervicalgia: Secondary | ICD-10-CM | POA: Diagnosis not present

## 2021-07-29 DIAGNOSIS — R202 Paresthesia of skin: Secondary | ICD-10-CM | POA: Insufficient documentation

## 2021-07-29 DIAGNOSIS — E039 Hypothyroidism, unspecified: Secondary | ICD-10-CM | POA: Diagnosis not present

## 2021-07-29 DIAGNOSIS — Z79899 Other long term (current) drug therapy: Secondary | ICD-10-CM | POA: Insufficient documentation

## 2021-07-29 DIAGNOSIS — Z7984 Long term (current) use of oral hypoglycemic drugs: Secondary | ICD-10-CM | POA: Diagnosis not present

## 2021-07-29 DIAGNOSIS — R200 Anesthesia of skin: Secondary | ICD-10-CM

## 2021-07-29 DIAGNOSIS — E119 Type 2 diabetes mellitus without complications: Secondary | ICD-10-CM | POA: Insufficient documentation

## 2021-07-29 MED ORDER — ONDANSETRON 4 MG PO TBDP
4.0000 mg | ORAL_TABLET | Freq: Once | ORAL | Status: AC
  Administered 2021-07-29: 4 mg via ORAL
  Filled 2021-07-29: qty 1

## 2021-07-29 MED ORDER — HYDROMORPHONE HCL 1 MG/ML IJ SOLN
2.0000 mg | Freq: Once | INTRAMUSCULAR | Status: AC
  Administered 2021-07-29: 2 mg via INTRAMUSCULAR
  Filled 2021-07-29: qty 2

## 2021-07-29 MED ORDER — ONDANSETRON 4 MG PO TBDP
4.0000 mg | ORAL_TABLET | Freq: Three times a day (TID) | ORAL | 1 refills | Status: DC | PRN
  Filled 2021-07-29: qty 12, 4d supply, fill #0

## 2021-07-29 MED ORDER — HYDROCODONE-ACETAMINOPHEN 5-325 MG PO TABS
1.0000 | ORAL_TABLET | Freq: Four times a day (QID) | ORAL | 0 refills | Status: DC | PRN
  Filled 2021-07-29: qty 14, 4d supply, fill #0

## 2021-07-29 MED ORDER — ONDANSETRON 4 MG PO TBDP: 4.0000 mg | ORAL_TABLET | Freq: Once | ORAL | Status: DC

## 2021-07-29 NOTE — Discharge Instructions (Addendum)
Take the hydrocodone as needed for pain.  Take the Zofran as needed for nausea and vomiting.  Pick those prescriptions up here at this pharmacy.  As we discussed we found lesion on your second cervical vertebrae of your neck.  This needs close follow-up with neurosurgery.  Follow-up with Dr. Ronnald Ramp or Dr. Venetia Constable.  They are both in the same group.  Information provided above.  Also there is some concern that this could be a metastatic lesion.  So make an appointment to follow-up with hematology oncology and/or follow-up with your primary care doctor they can initiate some of this work-up.  Wear the hard collar at all times.  Until seen by neurosurgery.

## 2021-07-29 NOTE — ED Provider Notes (Signed)
[facility] EMERGENC[facility] DEPARTMENT Provider Note   CSN: [id number] Arrival date & time: 07/29/21  0831     History Chief Complaint  Patient presents with   Neck Pain    left    Carol [name] is a 65 y.o. female.  Patient presenting with the acute onset of left-sided neck pain that started this morning.  Its been bothering her some for several days.  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  Is in the process of following up with Dr. Ronnald Ramp for the first time from neurosurgery.  Referral by her primary care doctor.  The pain this morning was quite intense.  Lateral neck seem to move into the jaw area.  Into the top of the shoulder but did not go down the left arm.  No numbness or weakness to the hand.  Patient did have some left facial numbness and some of that has persisted.  The pain really prohibits any movement of the neck to the left side.      Past Medical History:  Diagnosis Date   Allergy    Ankle edema    both ankles   Arthritis    hands,knees,elbows   Cancer ([place]) 2016   tumor kidney   Diabetes mellitus    type 2    Eczema    History of kidney stones    Hypothyroidism    Obesity    Sleep apnea    cpap machine setting of 3   Thyroid disease     Patient Active Problem List   Diagnosis Date Noted   UPJ (ureteropelvic junction) obstruction 11/22/2018   OSA on CPAP 05/03/2016   Morbid obesity ([place]) 05/03/2016   Hyperlipidemia 05/03/2016   Diabetes ([place]) 06/13/2015   Postoperative anemia due to acute blood loss 06/13/2015   Renal neoplasm 06/11/2015   NONSPECIFIC ABN FINDING RAD & OTH EXAM GI TRACT 10/08/2009    Past Surgical History:  Procedure Laterality Date   BREAST BIOPSY Right 2001   CHOLECYSTECTOMY  2009   open   COLONOSCOPY     CYSTOSCOPY W/ RETROGRADES Left 11/22/2018   Procedure: CYSTOSCOPY WITH RETROGRADE PYELOGRAM;  Surgeon: Ardis Hughs, MD;  Location: [facility];  Service: Urology;  Laterality: Left;    DILATATION & CURRETTAGE/HYSTEROSCOPY WITH RESECTOCOPE N/A 01/21/2013   Procedure: DILATATION & CURETTAGE/HYSTEROSCOPY WITH RESECTOCOPE AND RESECTION OF ENDOMETRIAL;  Surgeon: Selinda Orion, MD;  Location[facility];  Service: Gynecology;  Laterality: N/A;   KNEE ARTHROSCOPY     left    POLYPECTOMY     ROBOT ASSISTED PYELOPLASTY Left 11/22/2018   Procedure: XI ROBOTIC ASSISTED ATTEMPTED PYELOPLASTY CONVERTED TO NEPHRECTOMY/LYSIS OF ADHESIONS/UMBILICAL HERNIA REPAIR;  Surgeon: Ardis Hughs, MD;  Location: [facility];  Service: Urology;  Laterality: Left;   ROBOTIC ASSITED PARTIAL NEPHRECTOMY Left 06/11/2015   Procedure: LEFT ROBOTIC ASSISTED LAPAROSCOPIC  PARTIAL NEPHRECTOMY;  Surgeon: Ardis Hughs, MD;  Location: [facility];  Service: Urology;  Laterality: Left;   ROTATOR CUFF REPAIR Left 2011   URETERAL BIOPSY Left 02/14/2017   Procedure: URETERAL BIOPSY;  Surgeon: Ardis Hughs, MD;  Location: [facility];  Service: Urology;  Laterality: Left;   URETEROSCOPY WITH HOLMIUM LASER LITHOTRIPSY Left 02/14/2017   Procedure: URETEROSCOPY LITHOTRIPSY, URETERAL BALLOON DILATION;  Surgeon: Ardis Hughs, MD;  Location: [facility];  Service: Urology;  Laterality: Left;  With STENT     OB History   No obstetric history on file.  Family History  Problem Relation Age of Onset   Cancer Father     Social History   Tobacco Use   Smoking status: Never   Smokeless tobacco: Never  Substance Use Topics   Alcohol use: Yes    Comment: occ   Drug use: No    Home Medications Prior to Admission medications   Medication Sig Start Date End Date Taking? Authorizing Provider  atorvastatin (LIPITOR) 20 MG tablet Take 1 tablet by mouth daily. 08/06/15  Yes [provider]  atorvastatin (LIPITOR) 40 MG tablet atorvastatin 40 mg tablet  TAKE ONE TABLET BY MOUTH DAILY. 05/12/21  Yes [provider]  furosemide (LASIX) 20 MG tablet Take 1 tablet by mouth daily. 08/06/15   Yes [provider]  HYDROcodone-acetaminophen (NORCO/VICODIN) 5-325 MG tablet Take 1 tablet by mouth every 6 (six) hours as needed for moderate pain. 07/29/21  Yes Fredia Sorrow, MD  levothyroxine (SYNTHROID) 75 MCG tablet Take by mouth. 05/12/21  Yes [provider]  metFORMIN (GLUCOPHAGE) 1000 MG tablet Take by mouth. 05/31/21  Yes [provider]  ondansetron (ZOFRAN ODT) 4 MG disintegrating tablet Take 1 tablet (4 mg total) by mouth every 8 (eight) hours as needed for nausea or vomiting. 07/29/21  Yes Fredia Sorrow, MD  atorvastatin (LIPITOR) 10 MG tablet Take 10 mg by mouth at bedtime.     [provider]  calcium carbonate (OS-CAL) 1250 (500 Ca) MG chewable tablet Chew by mouth.    [provider]  furosemide (LASIX) 20 MG tablet Take 20 mg by mouth every morning.     [provider]  levothyroxine (SYNTHROID, LEVOTHROID) 75 MCG tablet Take 75 mcg by mouth daily before breakfast.    [provider]  metFORMIN (GLUCOPHAGE) 1000 MG tablet Take 1,000 mg by mouth daily with breakfast.    [provider]  OZEMPIC, 0.25 OR 0.5 MG/DOSE, 2 MG/1.5ML SOPN Inject 1 mg into the skin once a week. 07/19/21   [provider]  phenazopyridine (PYRIDIUM) 200 MG tablet Take 1 tablet (200 mg total) by mouth 3 (three) times daily as needed for pain. Patient not taking: Reported on 11/08/2018 02/14/17   Ardis Hughs, MD  [facility] HFA 108 [phone] BASE) MCG/ACT inhaler Inhale 2 puffs into the lungs every 4 (four) hours as needed for wheezing or shortness of breath. Uses seasonal 02/12/15   [provider]  rOPINIRole (REQUIP) 0.5 MG tablet TAKE 1/2 TABLET FOR 2 NIGHTS THEN INCREASE TO 1 TABLET NIGHTLY FOR 1 WEEK MAY INCREASE TO 2 TABLETS Patient not taking: Reported on 11/08/2018 06/19/17   Troy Sine, MD  traMADol (ULTRAM) 50 MG tablet Take 1-2 tablets (50-100 mg total) by mouth every 6 (six) hours as needed for moderate  pain. 11/22/18   Debbrah Alar, PA-C    Allergies    Morphine  Review of Systems   Review of Systems  Constitutional:  Negative for chills and fever.  HENT:  Negative for ear pain and sore throat.   Eyes:  Negative for pain and visual disturbance.  Respiratory:  Negative for cough and shortness of breath.   Cardiovascular:  Negative for chest pain and palpitations.  Gastrointestinal:  Negative for abdominal pain and vomiting.  Genitourinary:  Negative for dysuria and hematuria.  Musculoskeletal:  Positive for neck pain. Negative for arthralgias and back pain.  Skin:  Negative for color change and rash.  Neurological:  Positive for numbness. Negative for seizures, syncope, facial asymmetry, speech difficulty and  weakness.  All other systems reviewed and are negative.  Physical Exam Updated Vital Signs BP 140/63   Pulse 60   Temp 98 F (36.7 C) (Oral)   Resp 14   Ht 1.575 m ('5\' 2"'$ )   Wt 104.3 kg   SpO2 94%   BMI 42.07 kg/m   Physical Exam Vitals and nursing note reviewed.  Constitutional:      General: She is not in acute distress.    Appearance: Normal appearance. She is well-developed. She is not ill-appearing.  HENT:     Head: Normocephalic and atraumatic.  Eyes:     Extraocular Movements: Extraocular movements intact.     Conjunctiva/sclera: Conjunctivae normal.     Pupils: Pupils are equal, round, and reactive to light.  Cardiovascular:     Rate and Rhythm: Normal rate and regular rhythm.     Heart sounds: No murmur heard. Pulmonary:     Effort: Pulmonary effort is normal. No respiratory distress.     Breath sounds: Normal breath sounds.  Abdominal:     Palpations: Abdomen is soft.     Tenderness: There is no abdominal tenderness.  Musculoskeletal:        General: No swelling. Normal range of motion.     Cervical back: Tenderness present. No rigidity.  Skin:    General: Skin is warm and dry.  Neurological:     Mental Status: She is alert.     Cranial  Nerves: Cranial nerve deficit present.    ED Results / Procedures / Treatments   Labs (all labs ordered are listed, but only abnormal results are displayed) Labs Reviewed - No data to display  EKG None  Radiology CT Head Wo Contrast  Result Date: 07/29/2021 CLINICAL DATA:  Three weeks of neck pain, new numbness today, no trauma reported EXAM: CT HEAD WITHOUT CONTRAST CT CERVICAL SPINE WITHOUT CONTRAST TECHNIQUE: Multidetector CT imaging of the head and cervical spine was performed following the standard protocol without intravenous contrast. Multiplanar CT image reconstructions of the cervical spine were also generated. COMPARISON:  None. FINDINGS: CT HEAD FINDINGS Brain: No evidence of acute infarction, hemorrhage, hydrocephalus, extra-axial collection or mass lesion/mass effect. Vascular: No hyperdense vessel or unexpected calcification. Skull: Normal. Negative for fracture or focal lesion. Sinuses/Orbits: No acute finding. Other: None. CT CERVICAL SPINE FINDINGS Alignment: Normal. 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 No acute fracture. There is a large, somewhat expansile lytic lesion which almost completely replaces the normal C2 vertebral body and process of dens (series 5, image 38 series 2, image 25). There is little if any normal remaining medullary bone and there are multiple areas of cortical erosion; suspect nondisplaced pathologic fracture the left pedicle (series 5, image 45). Soft tissues and spinal canal: No prevertebral fluid or swelling. No visible canal hematoma. Disc levels: Mild disc space height loss and osteophytosis of the lower cervical spine. Upper chest: Negative. Other: None. IMPRESSION: 1. No acute intracranial pathology. 2. There is a large, somewhat expansile lytic lesion which almost completely replaces the normal C2 vertebral body and process of dens. 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 Electronically Signed   By: Eddie Candle M.D.   On: 07/29/2021 12:48   CT Cervical Spine Wo Contrast  Result Date: 07/29/2021 CLINICAL DATA:  Three weeks of neck pain, new numbness today, no trauma reported EXAM: CT HEAD WITHOUT CONTRAST CT CERVICAL SPINE WITHOUT CONTRAST TECHNIQUE: Multidetector CT imaging  of the head and cervical spine was performed following the standard protocol without intravenous contrast. Multiplanar CT image reconstructions of the cervical spine were also generated. COMPARISON:  None. FINDINGS: CT HEAD FINDINGS Brain: No evidence of acute infarction, hemorrhage, hydrocephalus, extra-axial collection or mass lesion/mass effect. Vascular: No hyperdense vessel or unexpected calcification. Skull: Normal. Negative for fracture or focal lesion. Sinuses/Orbits: No acute finding. Other: None. CT CERVICAL SPINE FINDINGS Alignment: Normal. 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 No acute fracture. There is a large, somewhat expansile lytic lesion which almost completely replaces the normal C2 vertebral body and process of dens (series 5, image 38 series 2, image 25). There is little if any normal remaining medullary bone and there are multiple areas of cortical erosion; suspect nondisplaced pathologic fracture the left pedicle (series 5, image 45). Soft tissues and spinal canal: No prevertebral fluid or swelling. No visible canal hematoma. Disc levels: Mild disc space height loss and osteophytosis of the lower cervical spine. Upper chest: Negative. Other: None. IMPRESSION: 1. No acute intracranial pathology. 2. There is a large, somewhat expansile lytic lesion which almost completely replaces the normal C2 vertebral body and process of dens. Findings are most consistent with osseous metastasis, perhaps renal cell carcinoma given expansile appearance. Electronically Signed   By: Eddie Candle M.D.   On: 07/29/2021 12:48     Procedures Procedures   Medications Ordered in ED Medications  HYDROmorphone (DILAUDID) injection 2 mg (2 mg Intramuscular Given 07/29/21 1019)  ondansetron (ZOFRAN-ODT) disintegrating tablet 4 mg (4 mg Oral Given 07/29/21 1018)  ondansetron (ZOFRAN-ODT) disintegrating tablet 4 mg (4 mg Oral Given 07/29/21 1331)    ED Course  I have reviewed the triage vital signs and the nursing notes.  Pertinent labs & imaging results that were available during my care of the patient were reviewed by me and considered in my medical decision making (see chart for details).    MDM Rules/Calculators/A&P                           After extensive talking to the patient it sounds like this been going on for few days.  A lot of the symptoms currently suggestive of torticollis.  But the left facial numbness was unusual and the severity of the pain is a bit unusual.  So patient got CT head and neck.  CT cervical spine showed concerns of a lesion bony lesion in C2 affecting the dens.  Highly suggestive of perhaps some metastasis lesion.  Head CT had no acute findings.  Discussed with Dr. Venetia Constable from neurosurgery.  He recommended aspen type hard collar.  We have not here his [place] J.  So that was used.  Patient will keep that in place.  He is recommending that either Dr. Ronnald Ramp or him follow her up within 2 weeks.  In addition we will follow-up give patient referral to hematology oncology.  Did offered to do additional evaluation here with labs and scans and chest x-ray if patient wanted she did opted not to do that would rather get that done on an outpatient basis.  So given information about hematology oncology and also can follow-up with her primary care doctor who is with [facility] to have this initiated.  Patient understands the importance of having this followed.  Patient will keep the hard collar on at all times.  Patient improved here with with Dilaudid for the pain.  But does require  Zofran to help with the  nausea.  Patient will be discharged with hydrocodone and Zofran. Final Clinical Impression(s) / ED Diagnoses Final diagnoses:  Neck pain  Left facial numbness    Rx / DC Orders ED Discharge Orders          Ordered    HYDROcodone-acetaminophen (NORCO/VICODIN) 5-325 MG tablet  Every 6 hours PRN        07/29/21 1551    ondansetron (ZOFRAN ODT) 4 MG disintegrating tablet  Every 8 hours PRN        07/29/21 1551             Fredia Sorrow, MD 07/29/21 1559

## 2021-07-29 NOTE — ED Triage Notes (Signed)
Left neck pain this am, Hx of degenerative disc of upper spine. Reports tensions and  stiffness to neck. Alert and oriented x 4, no neuro deficit.

## 2021-07-29 NOTE — ED Notes (Signed)
D/c paperwork reviewed with pt, including follow up care and prescriptions.  Pt assisted to wheelchair, wheeled out by family. All questions addressed at time of d/c.

## 2021-08-09 ENCOUNTER — Other Ambulatory Visit: Payer: Self-pay

## 2021-08-09 NOTE — Addendum Note (Signed)
Addended by: Junious Dresser on: 08/09/2021 11:32 AM   Modules accepted: Orders

## 2021-08-15 ENCOUNTER — Inpatient Hospital Stay: Payer: BC Managed Care – PPO | Attending: Physician Assistant

## 2021-08-15 ENCOUNTER — Other Ambulatory Visit: Payer: Self-pay | Admitting: Radiation Therapy

## 2021-08-15 DIAGNOSIS — C7951 Secondary malignant neoplasm of bone: Secondary | ICD-10-CM

## 2021-08-16 ENCOUNTER — Other Ambulatory Visit: Payer: Self-pay

## 2021-08-16 ENCOUNTER — Ambulatory Visit
Admission: RE | Admit: 2021-08-16 | Discharge: 2021-08-16 | Disposition: A | Discharge status: Home / Self Care | Payer: BC Managed Care – PPO | Source: Ambulatory Visit | Attending: Radiation Oncology | Admitting: Radiation Oncology

## 2021-08-16 ENCOUNTER — Encounter: Payer: Self-pay | Admitting: Radiation Oncology

## 2021-08-16 ENCOUNTER — Other Ambulatory Visit: Payer: Self-pay | Admitting: Radiation Therapy

## 2021-08-16 DIAGNOSIS — C9000 Multiple myeloma not having achieved remission: Secondary | ICD-10-CM | POA: Insufficient documentation

## 2021-08-16 DIAGNOSIS — C7951 Secondary malignant neoplasm of bone: Secondary | ICD-10-CM

## 2021-08-16 DIAGNOSIS — E669 Obesity, unspecified: Secondary | ICD-10-CM | POA: Insufficient documentation

## 2021-08-16 DIAGNOSIS — R911 Solitary pulmonary nodule: Secondary | ICD-10-CM

## 2021-08-16 DIAGNOSIS — E039 Hypothyroidism, unspecified: Secondary | ICD-10-CM | POA: Diagnosis not present

## 2021-08-16 DIAGNOSIS — C642 Malignant neoplasm of left kidney, except renal pelvis: Secondary | ICD-10-CM | POA: Diagnosis not present

## 2021-08-16 DIAGNOSIS — Z87442 Personal history of urinary calculi: Secondary | ICD-10-CM | POA: Diagnosis not present

## 2021-08-16 DIAGNOSIS — G4730 Sleep apnea, unspecified: Secondary | ICD-10-CM | POA: Diagnosis not present

## 2021-08-16 DIAGNOSIS — E119 Type 2 diabetes mellitus without complications: Secondary | ICD-10-CM | POA: Insufficient documentation

## 2021-08-16 NOTE — Progress Notes (Signed)
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    ICD-10-CM   1. Metastatic bone tumor ([place])  C79.51       HISTORY OF PRESENT ILLNESS: Carol [name] is a 65 y.o. female seen at the request of Dr. Louis Meckel. 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 In summary, she initially presented with left lower quadrant pain and was found to have a 3.7 cm lesion in the left kidney. She proceeded to left partial nephrectomy on 06/11/15 under the care of Dr. Louis Meckel, with pathology showing renal cell carcinoma, clear cell type, 3.4 cm, with focal extracapsular extension. She was followed in close observation and remained without evidence of disease on serial CT A/P scans through 08/2019, after which she was lost to follow up with urology. Of note, she had completion of left nephrectomy in 11/2018 after being unable to perform a ureteral stricture repair because of the dense adhesions and fibrosis in the area.  More recently, she presented to the ED on 07/29/21 with 3 weeks of neck pain and new facial numbness.  A CT head and CT cervical spine performed at that time showed no acute intracranial pathology but there was a large, somewhat expansile lytic lesion which almost completed replaces the normal C2 vertebral body and process of dens.  She was seen by her PCP in follow-up and underwent CT A/P on 08/08/21 showing a 2 cm soft tissue density just anteromedial to the spleen in addition to a 3 cm lesion in the left renal bed.  On  review of the imaging, we also noted a nodule in the left upper lobe that was only partially imaged and not mentioned in the radiology report.  Her case was reviewed in the recent multidisciplinary brain and spine tumor conference and the consensus recommendation was to proceed with corpectomy for debulking and stabilization at the C2 level as well as tissue confirmation under the care of Dr. Venetia Constable and likely to be followed by postoperative radiation.  She is scheduled for a PET scan on 08/26/21 to complete her disease staging.  She has been kindly referred to [place] today for consideration of postoperative radiotherapy to the osseous metastatic disease at C2.  PREVIOUS RADIATION THERAPY: No  PAST MEDICAL HISTORY:  Past Medical History:  Diagnosis Date   Allergy    Ankle edema    both ankles   Arthritis    hands,knees,elbows   Cancer ([place]) 2016   tumor kidney   Diabetes mellitus    type 2    Eczema    History of kidney stones    Hypothyroidism    Obesity    Sleep apnea    cpap machine setting of 3   Thyroid disease       PAST SURGICAL HISTORY: Past Surgical History:  Procedure Laterality Date   BREAST BIOPSY Right 2001   CHOLECYSTECTOMY  2009   open   COLONOSCOPY     CYSTOSCOPY W/ RETROGRADES Left 11/22/2018   Procedure: CYSTOSCOPY WITH RETROGRADE PYELOGRAM;  Surgeon: Louis Meckel  W, MD;  Location: [facility];  Service: Urology;  Laterality: Left;   DILATATION & CURRETTAGE/HYSTEROSCOPY WITH RESECTOCOPE N/A 01/21/2013   Procedure: DILATATION & CURETTAGE/HYSTEROSCOPY WITH RESECTOCOPE AND RESECTION OF ENDOMETRIAL;  Surgeon: Selinda Orion, MD;  Location: [facility];  Service: Gynecology;  Laterality: N/A;   KNEE ARTHROSCOPY     left    POLYPECTOMY     ROBOT ASSISTED PYELOPLASTY Left 11/22/2018   Procedure: XI ROBOTIC ASSISTED ATTEMPTED PYELOPLASTY CONVERTED TO NEPHRECTOMY/LYSIS OF ADHESIONS/UMBILICAL HERNIA REPAIR;  Surgeon: Ardis Hughs, MD;  Location:  [facility];  Service: Urology;  Laterality: Left;   ROBOTIC ASSITED PARTIAL NEPHRECTOMY Left 06/11/2015   Procedure: LEFT ROBOTIC ASSISTED LAPAROSCOPIC  PARTIAL NEPHRECTOMY;  Surgeon: Ardis Hughs, MD;  Location: [facility];  Service: Urology;  Laterality: Left;   ROTATOR CUFF REPAIR Left 2011   URETERAL BIOPSY Left 02/14/2017   Procedure: URETERAL BIOPSY;  Surgeon: Ardis Hughs, MD;  Location: [facility];  Service: Urology;  Laterality: Left;   URETEROSCOPY WITH HOLMIUM LASER LITHOTRIPSY Left 02/14/2017   Procedure: URETEROSCOPY LITHOTRIPSY, URETERAL BALLOON DILATION;  Surgeon: Ardis Hughs, MD;  Location: [facility];  Service: Urology;  Laterality: Left;  With STENT    FAMILY HISTORY:  Family History  Problem Relation Age of Onset   Cancer Father     SOCIAL HISTORY:  Social History   Socioeconomic History   Marital status: Married    Spouse name: Not on file   Number of children: Not on file   Years of education: Not on file   Highest education level: Not on file  Occupational History   Not on file  Tobacco Use   Smoking status: Never   Smokeless tobacco: Never  Vaping Use   Vaping Use: Not on file  Substance and Sexual Activity   Alcohol use: Yes    Comment: occ   Drug use: No   Sexual activity: Not on file  Other Topics Concern   Not on file  Social History Narrative   Not on file   Social Determinants of Health   Financial Resource Strain: Not on file  Food Insecurity: Not on file  Transportation Needs: Not on file  Physical Activity: Not on file  Stress: Not on file  Social Connections: Not on file  Intimate Partner Violence: Not on file    ALLERGIES: Morphine  MEDICATIONS:  Current Outpatient Medications  Medication Sig Dispense Refill   atorvastatin (LIPITOR) 20 MG tablet Take 20 mg by mouth at bedtime.     furosemide (LASIX) 20 MG tablet Take 20 mg by mouth daily as needed for fluid or edema.     levothyroxine (SYNTHROID) 75 MCG tablet Take 75 mcg  by mouth daily before breakfast.     metFORMIN (GLUCOPHAGE) 1000 MG tablet Take 1,000 mg by mouth daily with breakfast.     oxyCODONE (OXY IR/ROXICODONE) 5 MG immediate release tablet Take 2.5 mg by mouth every 6 (six) hours as needed for moderate pain.     OZEMPIC, 0.25 OR 0.5 MG/DOSE, 2 MG/1.5ML SOPN Inject 0.5 mg into the skin every Saturday.     PROAIR HFA 108 (90 BASE) MCG/ACT inhaler Inhale 2 puffs into the lungs every 4 (four) hours as needed for wheezing or shortness of breath. Uses seasonal  3   ondansetron (ZOFRAN ODT) 4 MG disintegrating tablet Take 1 tablet (4 mg total) by mouth every 8 (eight) hours as needed for nausea or vomiting. (Patient not taking: No sig  reported) 12 tablet 1   traMADol (ULTRAM) 50 MG tablet Take 1-2 tablets (50-100 mg total) by mouth every 6 (six) hours as needed for moderate pain. (Patient not taking: No sig reported) 30 tablet 0   No current facility-administered medications for this encounter.    REVIEW OF SYSTEMS:  On review of systems, the patient reports that she is doing well overall. She denies any chest pain, shortness of breath, cough, fevers, chills, night sweats, unintended weight changes. She denies any bowel or bladder disturbances, and denies abdominal pain, nausea or vomiting. She reports her neck pain today is rated 4/10 and much improved in her neck brace.  She denies any paresthesias or focal weakness in the upper extremities.  A complete review of systems is obtained and is otherwise negative.  PHYSICAL EXAM:  Wt Readings from Last 3 Encounters:  08/16/21 219 lb 9.6 oz (99.6 kg)  07/29/21 230 lb (104.3 kg)  11/22/18 235 lb (106.6 kg)   Temp Readings from Last 3 Encounters:  08/16/21 97.6 F (36.4 C)  07/29/21 98 F (36.7 C) (Oral)  11/25/18 98.6 F (37 C) (Oral)   BP Readings from Last 3 Encounters:  08/16/21 112/68  07/29/21 124/81  11/25/18 116/63   Pulse Readings from Last 3 Encounters:  08/16/21 76  07/29/21 64  11/25/18  78   Pain Assessment Pain Score: 4  Pain Frequency: Constant Pain Loc: Neck/10  In general this is a well appearing Caucasian female in no acute distress.  She's alert and oriented x4 and appropriate throughout the examination. Cardiopulmonary assessment is negative for acute distress and she exhibits normal effort.   KPS = 70  100 - Normal; no complaints; no evidence of disease. 90   - Able to carry on normal activity; minor signs or symptoms of disease. 80   - Normal activity with effort; some signs or symptoms of disease. 71   - Cares for self; unable to carry on normal activity or to do active work. 60   - Requires occasional assistance, but is able to care for most of his personal needs. 50   - Requires considerable assistance and frequent medical care. 33   - Disabled; requires special care and assistance. 27   - Severely disabled; hospital admission is indicated although death not imminent. 45   - Very sick; hospital admission necessary; active supportive treatment necessary. 10   - Moribund; fatal processes progressing rapidly. 0     - Dead  Karnofsky DA, Abelmann Stark, Craver LS and Burchenal [facility] [phone]) The use of the nitrogen mustards in the palliative treatment of carcinoma: with particular reference to bronchogenic carcinoma Cancer 1 634-56  LABORATORY DATA:  Lab Results  Component Value Date   WBC 5.9 11/25/2018   HGB 8.2 (L) 11/25/2018   HCT 25.0 (L) 11/25/2018   MCV 95.8 11/25/2018   PLT 131 (L) 11/25/2018   Lab Results  Component Value Date   NA 140 11/25/2018   K 4.0 11/25/2018   CL 108 11/25/2018   CO2 24 11/25/2018   Lab Results  Component Value Date   ALT 35 06/01/2015   AST 27 06/01/2015   ALKPHOS 86 06/01/2015   BILITOT 0.8 06/01/2015     RADIOGRAPHY: CT Head Wo Contrast  Result Date: 07/29/2021 CLINICAL DATA:  Three weeks of neck pain, new numbness today, no trauma reported EXAM: CT HEAD WITHOUT CONTRAST CT CERVICAL SPINE WITHOUT CONTRAST  TECHNIQUE: Multidetector CT imaging of the head and cervical spine was  performed following the standard protocol without intravenous contrast. Multiplanar CT image reconstructions of the cervical spine were also generated. COMPARISON:  None. FINDINGS: CT HEAD FINDINGS Brain: No evidence of acute infarction, hemorrhage, hydrocephalus, extra-axial collection or mass lesion/mass effect. Vascular: No hyperdense vessel or unexpected calcification. Skull: Normal. Negative for fracture or focal lesion. Sinuses/Orbits: No acute finding. Other: None. CT CERVICAL SPINE FINDINGS Alignment: Normal. 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 No acute fracture. There is a large, somewhat expansile lytic lesion which almost completely replaces the normal C2 vertebral body and process of dens (series 5, image 38 series 2, image 25). There is little if any normal remaining medullary bone and there are multiple areas of cortical erosion; suspect nondisplaced pathologic fracture the left pedicle (series 5, image 45). Soft tissues and spinal canal: No prevertebral fluid or swelling. No visible canal hematoma. Disc levels: Mild disc space height loss and osteophytosis of the lower cervical spine. Upper chest: Negative. Other: None. IMPRESSION: 1. No acute intracranial pathology. 2. There is a large, somewhat expansile lytic lesion which almost completely replaces the normal C2 vertebral body and process of dens. Findings are most consistent with osseous metastasis, perhaps renal cell carcinoma given expansile appearance. Electronically Signed   By: Eddie Candle M.D.   On: 07/29/2021 12:48   CT Chest W Contrast  Result Date: 08/18/2021 CLINICAL DATA:  Outside MRI showed cervical spine lesions. History of left nephrectomy for renal cell cancer. EXAM: CT CHEST WITH CONTRAST TECHNIQUE: Multidetector CT imaging of the chest was performed during intravenous contrast administration. CONTRAST:  89m  OMNIPAQUE IOHEXOL 350 MG/ML SOLN COMPARISON:  Prior radiographs dating back to 2017 FINDINGS: Cardiovascular: The heart is normal in size. No pericardial effusion. The aorta is normal in caliber. No dissection. Minimal scattered atherosclerotic calcifications. The branch vessels are patent. Minimal scattered coronary artery calcifications. Mediastinum/Nodes: No mediastinal or hilar mass or lymphadenopathy. The esophagus is grossly normal. The thyroid gland is unremarkable. Lungs/Pleura: Smoothly marginated well circumscribed left upper lobe pulmonary nodule measuring 9.5 mm. It appears to have few speckled calcifications and possibly some macroscopic fat. A hamartoma would be a distinct possibility. This is unchanged since multiple prior chest x-rays and is considered benign. No other pulmonary lesions are identified. There are few tiny scattered subpleural nodules which are likely benign nodes. No infiltrates, edema or effusions. No interstitial lung disease or bronchiectasis. Upper Abdomen: No significant upper abdominal findings. Status post left nephrectomy. No hepatic or adrenal gland lesions. No upper abdominal adenopathy. Musculoskeletal: No breast masses, supraclavicular or axillary adenopathy. The thoracic vertebral bodies are grossly normal. Suspect very small lytic lesions in the sternum and ribs. Multiple myeloma would be a possibility. IMPRESSION: 1. Stable 9.5 mm left upper lobe pulmonary nodule. This is considered benign. 2. No mediastinal or hilar mass or adenopathy. 3. Suspect very small lytic lesions in the sternum and ribs. Multiple myeloma would be a possibility. 4. Status post left nephrectomy. No findings for upper abdominal metastatic disease. 5. Aortic atherosclerosis. Aortic Atherosclerosis (ICD10-I70.0). Electronically Signed   By: PMarijo SanesM.D.   On: 08/18/2021 17:54   CT Cervical Spine Wo Contrast  Result Date: 07/29/2021 CLINICAL DATA:  Three weeks of neck pain, new numbness  today, no trauma reported EXAM: CT HEAD WITHOUT CONTRAST CT CERVICAL SPINE WITHOUT CONTRAST TECHNIQUE: Multidetector CT imaging of the head and cervical spine was performed following the standard protocol without intravenous contrast. Multiplanar CT image reconstructions of the cervical spine were  also generated. COMPARISON:  None. FINDINGS: CT HEAD FINDINGS Brain: No evidence of acute infarction, hemorrhage, hydrocephalus, extra-axial collection or mass lesion/mass effect. Vascular: No hyperdense vessel or unexpected calcification. Skull: Normal. Negative for fracture or focal lesion. Sinuses/Orbits: No acute finding. Other: None. CT CERVICAL SPINE FINDINGS Alignment: Normal. 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 No acute fracture. There is a large, somewhat expansile lytic lesion which almost completely replaces the normal C2 vertebral body and process of dens (series 5, image 38 series 2, image 25). There is little if any normal remaining medullary bone and there are multiple areas of cortical erosion; suspect nondisplaced pathologic fracture the left pedicle (series 5, image 45). Soft tissues and spinal canal: No prevertebral fluid or swelling. No visible canal hematoma. Disc levels: Mild disc space height loss and osteophytosis of the lower cervical spine. Upper chest: Negative. Other: None. IMPRESSION: 1. No acute intracranial pathology. 2. There is a large, somewhat expansile lytic lesion which almost completely replaces the normal C2 vertebral body and process of dens. Findings are most consistent with osseous metastasis, perhaps renal cell carcinoma given expansile appearance. Electronically Signed   By: Eddie Candle M.D.   On: 07/29/2021 12:48      IMPRESSION/PLAN: 1. 65 y.o. 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 Today, we talked to the patient and family about the findings  and workup thus far.  We have recommended a CT chest for further evaluation of the partially imaged nodule in the left upper lobe lung and she is in agreement to proceed.  She is also scheduled for a PET scan to complete her disease staging on 08/26/2021.  We discussed the natural history of metastatic carcinoma and general treatment, highlighting the role of radiotherapy in the management. We discussed the available radiation techniques, and focused on the details and logistics of delivery.  The current recommendation is to proceed with a 5 fraction course of postoperative stereotactic radiosurgery  [facility]) at the level of C2 but she understands that our recommendations may potentially change based on final pathology from her procedure as well as any additional information noted on her upcoming PET scan.  We reviewed the anticipated acute and late sequelae associated with radiation in this setting. The patient was encouraged to ask questions that were answered to her satisfaction.  We will await her final surgical pathology and proceed with treatment planning accordingly at that time in anticipation of beginning the postoperative SRS treatments approximately 3 weeks after her procedure.  She appears to have a good understanding of her disease and our treatment recommendations and is comfortable and in agreement with the stated plan.  She has freely signed written consent to proceed today in the office and a copy of this document will be placed in her medical record.  We enjoyed meeting her and her husband today and look forward to continuing to participate in her care.  We personally spent 70 minutes in this encounter including chart review, reviewing radiological studies, meeting face-to-face with the patient, entering orders, coordinating her care and completing documentation.    Nicholos Johns, PA-C    Tyler Pita, MD  [facility] Direct Dial: [phone]  Fax:  [phone] [email or web address]  Skype  [email or web address]   This document serves as a record of services personally performed by Tyler Pita, MD and Freeman Caldron, PA-C. It was created on their behalf by  Wilburn Mylar, a trained medical scribe. The creation of this record is based on the scribe's personal observations and the provider's statements to them. This document has been checked and approved by the attending provider.

## 2021-08-16 NOTE — Progress Notes (Signed)
Histology and Location of Primary Cancer: Renal Ca  Sites of Visceral and Bony Metastatic Disease: Spine  Location(s) of Symptomatic Metastases: Spine  Past/Anticipated chemotherapy by medical oncology, if any: none  Pain on a scale of 0-10 is: 4    If Spine Met(s), symptoms, if any, include: Bowel/Bladder retention or incontinence (please describe):  Numbness or weakness in extremities (please describe):  Current Decadron regimen, if applicable: no  Ambulatory status? Walker? Wheelchair?: Ambulatory with Assistance  SAFETY ISSUES: Prior radiation? no Pacemaker/ICD? no Possible current pregnancy? no Is the patient on methotrexate? no  Current Complaints / other details:  Neck pain started September 9th got bad and had to go to the hospital due to severe pain and numbness to left side of her face and this continues to be numb.

## 2021-08-18 ENCOUNTER — Other Ambulatory Visit: Payer: Self-pay

## 2021-08-18 ENCOUNTER — Encounter (HOSPITAL_COMMUNITY): Payer: Self-pay

## 2021-08-18 ENCOUNTER — Ambulatory Visit (HOSPITAL_COMMUNITY)
Admission: RE | Admit: 2021-08-18 | Discharge: 2021-08-18 | Disposition: A | Discharge status: Home / Self Care | Payer: BC Managed Care – PPO | Source: Ambulatory Visit | Attending: Radiation Oncology | Admitting: Radiation Oncology

## 2021-08-18 DIAGNOSIS — R911 Solitary pulmonary nodule: Secondary | ICD-10-CM | POA: Diagnosis present

## 2021-08-18 LAB — POCT I-STAT CREATININE: Creatinine, Ser: 1 mg/dL (ref 0.44–1.00)

## 2021-08-18 MED ORDER — IOHEXOL 350 MG/ML SOLN
55.0000 mL | Freq: Once | INTRAVENOUS | Status: AC | PRN
  Administered 2021-08-18: 55 mL via INTRAVENOUS

## 2021-08-19 ENCOUNTER — Ambulatory Visit
Admission: RE | Admit: 2021-08-19 | Discharge: 2021-08-19 | Disposition: A | Discharge status: Home / Self Care | Payer: BC Managed Care – PPO | Source: Ambulatory Visit | Attending: Radiation Oncology | Admitting: Radiation Oncology

## 2021-08-19 ENCOUNTER — Other Ambulatory Visit: Payer: Self-pay | Admitting: Radiation Oncology

## 2021-08-19 ENCOUNTER — Other Ambulatory Visit: Payer: Self-pay | Admitting: Radiation Therapy

## 2021-08-19 ENCOUNTER — Other Ambulatory Visit: Payer: Self-pay | Admitting: Neurological Surgery

## 2021-08-19 DIAGNOSIS — C7951 Secondary malignant neoplasm of bone: Secondary | ICD-10-CM

## 2021-08-23 ENCOUNTER — Other Ambulatory Visit: Payer: Self-pay | Admitting: Radiation Therapy

## 2021-08-23 LAB — PROTEIN ELECTROPHORESIS, SERUM
A/G Ratio: 1.6 (ref 0.7–1.7)
Albumin ELP: 3.7 g/dL (ref 2.9–4.4)
Alpha-1-Globulin: 0.2 g/dL (ref 0.0–0.4)
Alpha-2-Globulin: 0.6 g/dL (ref 0.4–1.0)
Beta Globulin: 1 g/dL (ref 0.7–1.3)
Gamma Globulin: 0.5 g/dL (ref 0.4–1.8)
Globulin, Total: 2.3 g/dL (ref 2.2–3.9)
Total Protein ELP: 6 g/dL (ref 6.0–8.5)

## 2021-08-24 ENCOUNTER — Other Ambulatory Visit: Payer: Self-pay

## 2021-08-24 ENCOUNTER — Encounter (HOSPITAL_COMMUNITY)
Admission: RE | Admit: 2021-08-24 | Discharge: 2021-08-24 | Disposition: A | Discharge status: Home / Self Care | Payer: BC Managed Care – PPO | Source: Ambulatory Visit | Attending: Physician Assistant | Admitting: Physician Assistant

## 2021-08-24 ENCOUNTER — Encounter (HOSPITAL_COMMUNITY): Payer: Self-pay | Admitting: Neurological Surgery

## 2021-08-24 DIAGNOSIS — C7951 Secondary malignant neoplasm of bone: Secondary | ICD-10-CM

## 2021-08-24 LAB — GLUCOSE, CAPILLARY: Glucose-Capillary: 120 mg/dL — ABNORMAL HIGH (ref 70–99)

## 2021-08-24 MED ORDER — FLUDEOXYGLUCOSE F - 18 (FDG) INJECTION
10.7000 | Freq: Once | INTRAVENOUS | Status: AC
  Administered 2021-08-24: 10.7 via INTRAVENOUS

## 2021-08-24 NOTE — Progress Notes (Signed)
PCP - Dr. Rogelia Boga  Cardiologist - Denies  EP-Denies  Endocrine-Denies  Pulm-Denies  Chest x-ray - Denies  EKG - 08/25/21 DOS  Stress Test - Denies  ECHO - Denies  Cardiac Cath - Denies  AICD-na PM-na LOOP-na  Dialysis-Denies  Sleep Study - Yes- Positive CPAP - Has not used for 4 months  LABS- 08/25/21: CBC, BMP, T/S, COVID, PCR  ASA-Denies  ERAS-Yes, clears until 1000  HA1C- 05/11/21: (E) 7.6 Fasting Blood Sugar - 108-120 Checks Blood Sugar ___3__ times a week  Anesthesia-No  Pt denies having chest pain, sob, or fever during the pre-op phone call. All instructions explained to the pt, with a verbal understanding of the material including: as of today, stop taking all Aspirin (unless instructed by your doctor) and Other Aspirin containing products, Vitamins, Fish oils, and Herbal medications. Also stop all NSAIDS i.e. Advil, Ibuprofen, Motrin, Aleve, Anaprox, Naproxen, BC, Goody Powders, and all Supplements.    WHAT DO I DO ABOUT MY DIABETES MEDICATION?  Do not take MetFORMIN (GLUCOPHAGE) the morning of surgery.  The day of surgery, do not take other diabetes injectable OZEMPIC   How do I manage my blood sugar before surgery? Check your blood sugar at least 4 times a day, starting 2 days before surgery, to make sure that the level is not too high or low. Check your blood sugar the morning of your surgery when you wake up and every 2 hours until you get to the Short Stay unit. If your blood sugar is less than 70 mg/dL, you will need to treat for low blood sugar: Do not take insulin. Treat a low blood sugar (less than 70 mg/dL) with  cup of clear juice (cranberry or apple), 4 glucose tablets, OR glucose gel. Recheck blood sugar in 15 minutes after treatment (to make sure it is greater than 70 mg/dL). If your blood sugar is not greater than 70 mg/dL on recheck, call [phone]  for further instructions.  If your CBG is greater than 220 mg/dL, call the number  above for further instructions.  Reviewed and Endorsed by [facility] Patient Education Committee, August 2015  Pt also instructed to wear a mask and social distance if she has to go out prior to her surgery. The opportunity to ask questions was provided.   Coronavirus Screening  Have you experienced the following symptoms:  Cough yes/no: No Fever (>100.7F)  yes/no: No Runny nose yes/no: No Sore throat yes/no: No Difficulty breathing/shortness of breath  yes/no: No  Have you or a family member traveled in the last 14 days and where? yes/no: No   If the patient indicates "YES" to the above questions, their PAT will be rescheduled to limit the exposure to others and, the surgeon will be notified. THE PATIENT WILL NEED TO BE ASYMPTOMATIC FOR 14 DAYS.   If the patient is not experiencing any of these symptoms, the PAT nurse will instruct them to NOT bring anyone with them to their appointment since they may have these symptoms or traveled as well.   Please remind your patients and families that hospital visitation restrictions are in effect and the importance of the restrictions.

## 2021-08-25 ENCOUNTER — Inpatient Hospital Stay (HOSPITAL_COMMUNITY): Payer: BC Managed Care – PPO | Admitting: Certified Registered"

## 2021-08-25 ENCOUNTER — Inpatient Hospital Stay (HOSPITAL_COMMUNITY): Payer: BC Managed Care – PPO

## 2021-08-25 ENCOUNTER — Other Ambulatory Visit: Payer: Self-pay

## 2021-08-25 ENCOUNTER — Encounter (HOSPITAL_COMMUNITY): Payer: Self-pay | Admitting: Neurological Surgery

## 2021-08-25 ENCOUNTER — Inpatient Hospital Stay (HOSPITAL_COMMUNITY)
Admission: RE | Admit: 2021-08-25 | Discharge: 2021-08-28 | DRG: 472 | Disposition: A | Discharge status: Home Health | Payer: BC Managed Care – PPO | Attending: Neurological Surgery | Admitting: Neurological Surgery

## 2021-08-25 ENCOUNTER — Inpatient Hospital Stay (HOSPITAL_COMMUNITY)
Admission: RE | Disposition: A | Discharge status: Home / Self Care | Payer: Self-pay | Source: Home / Self Care | Attending: Neurological Surgery

## 2021-08-25 DIAGNOSIS — Z885 Allergy status to narcotic agent status: Secondary | ICD-10-CM

## 2021-08-25 DIAGNOSIS — M8448XA Pathological fracture, other site, initial encounter for fracture: Secondary | ICD-10-CM | POA: Diagnosis present

## 2021-08-25 DIAGNOSIS — Z20822 Contact with and (suspected) exposure to covid-19: Secondary | ICD-10-CM | POA: Diagnosis present

## 2021-08-25 DIAGNOSIS — G4730 Sleep apnea, unspecified: Secondary | ICD-10-CM | POA: Diagnosis present

## 2021-08-25 DIAGNOSIS — M6748 Ganglion, other site: Secondary | ICD-10-CM | POA: Diagnosis present

## 2021-08-25 DIAGNOSIS — M5481 Occipital neuralgia: Secondary | ICD-10-CM | POA: Diagnosis present

## 2021-08-25 DIAGNOSIS — E039 Hypothyroidism, unspecified: Secondary | ICD-10-CM | POA: Diagnosis present

## 2021-08-25 DIAGNOSIS — M4802 Spinal stenosis, cervical region: Principal | ICD-10-CM | POA: Diagnosis present

## 2021-08-25 DIAGNOSIS — Z419 Encounter for procedure for purposes other than remedying health state, unspecified: Secondary | ICD-10-CM

## 2021-08-25 DIAGNOSIS — Z6839 Body mass index (BMI) 39.0-39.9, adult: Secondary | ICD-10-CM | POA: Diagnosis not present

## 2021-08-25 DIAGNOSIS — E119 Type 2 diabetes mellitus without complications: Secondary | ICD-10-CM | POA: Diagnosis present

## 2021-08-25 DIAGNOSIS — S129XXA Fracture of neck, unspecified, initial encounter: Secondary | ICD-10-CM | POA: Diagnosis present

## 2021-08-25 HISTORY — PX: POSTERIOR CERVICAL FUSION/FORAMINOTOMY: SHX5038

## 2021-08-25 LAB — BASIC METABOLIC PANEL
Anion gap: 10 (ref 5–15)
BUN: 15 mg/dL (ref 8–23)
CO2: 22 mmol/L (ref 22–32)
Calcium: 9.6 mg/dL (ref 8.9–10.3)
Chloride: 109 mmol/L (ref 98–111)
Creatinine, Ser: 0.93 mg/dL (ref 0.44–1.00)
GFR, Estimated: >60 mL/min (ref >60)
Glucose, Bld: 124 mg/dL — ABNORMAL HIGH (ref 70–99)
Potassium: 3.8 mmol/L (ref 3.5–5.1)
Sodium: 141 mmol/L (ref 135–145)

## 2021-08-25 LAB — SURGICAL PCR SCREEN
MRSA, PCR: NEGATIVE
Staphylococcus aureus: NEGATIVE

## 2021-08-25 LAB — CBC
HCT: 43.2 % (ref 36.0–46.0)
Hemoglobin: 14.3 g/dL (ref 12.0–15.0)
MCH: 31 pg (ref 26.0–34.0)
MCHC: 33.1 g/dL (ref 30.0–36.0)
MCV: 93.7 fL (ref 80.0–100.0)
Platelets: 176 10*3/uL (ref 150–400)
RBC: 4.61 MIL/uL (ref 3.87–5.11)
RDW: 13.1 % (ref 11.5–15.5)
WBC: 3.9 10*3/uL — ABNORMAL LOW (ref 4.0–10.5)
nRBC: 0 % (ref 0.0–0.2)

## 2021-08-25 LAB — TYPE AND SCREEN
ABO/RH(D): O NEG
Antibody Screen: NEGATIVE

## 2021-08-25 LAB — GLUCOSE, CAPILLARY
Glucose-Capillary: 133 mg/dL — ABNORMAL HIGH (ref 70–99)
Glucose-Capillary: 189 mg/dL — ABNORMAL HIGH (ref 70–99)

## 2021-08-25 LAB — HEMOGLOBIN A1C
Hgb A1c MFr Bld: 5.4 % (ref 4.8–5.6)
Mean Plasma Glucose: 108.28 mg/dL

## 2021-08-25 LAB — SARS CORONAVIRUS 2 BY RT PCR (HOSPITAL ORDER, PERFORMED IN ~~LOC~~ HOSPITAL LAB): SARS Coronavirus 2: NEGATIVE

## 2021-08-25 SURGERY — POSTERIOR CERVICAL FUSION/FORAMINOTOMY LEVEL 4
Anesthesia: General

## 2021-08-25 MED ORDER — PHENYLEPHRINE 40 MCG/ML (10ML) SYRINGE FOR IV PUSH (FOR BLOOD PRESSURE SUPPORT)
PREFILLED_SYRINGE | INTRAVENOUS | Status: AC
  Filled 2021-08-25: qty 10

## 2021-08-25 MED ORDER — AMISULPRIDE (ANTIEMETIC) 5 MG/2ML IV SOLN
INTRAVENOUS | Status: AC
  Filled 2021-08-25: qty 4

## 2021-08-25 MED ORDER — SODIUM CHLORIDE 0.9% FLUSH
3.0000 mL | Freq: Two times a day (BID) | INTRAVENOUS | Status: DC
  Administered 2021-08-26 (×2): 3 mL via INTRAVENOUS

## 2021-08-25 MED ORDER — MENTHOL 3 MG MT LOZG: 1.0000 | LOZENGE | OROMUCOSAL | Status: DC | PRN

## 2021-08-25 MED ORDER — MIDAZOLAM HCL 2 MG/2ML IJ SOLN
INTRAMUSCULAR | Status: AC
  Filled 2021-08-25: qty 2

## 2021-08-25 MED ORDER — CYCLOBENZAPRINE HCL 10 MG PO TABS
10.0000 mg | ORAL_TABLET | Freq: Three times a day (TID) | ORAL | Status: DC | PRN
  Filled 2021-08-25: qty 1

## 2021-08-25 MED ORDER — LIDOCAINE-EPINEPHRINE 2 %-1:100000 IJ SOLN
INTRAMUSCULAR | Status: DC | PRN
Start: 2021-08-25 — End: 2021-08-25
  Administered 2021-08-25: 10 mL via INTRADERMAL

## 2021-08-25 MED ORDER — ALBUMIN HUMAN 5 % IV SOLN: INTRAVENOUS | Status: DC | PRN

## 2021-08-25 MED ORDER — METFORMIN HCL 500 MG PO TABS
1000.0000 mg | ORAL_TABLET | Freq: Every day | ORAL | Status: DC
  Administered 2021-08-26 – 2021-08-28 (×3): 1000 mg via ORAL
  Filled 2021-08-25 (×3): qty 2

## 2021-08-25 MED ORDER — LIDOCAINE 2% (20 MG/ML) 5 ML SYRINGE
INTRAMUSCULAR | Status: DC | PRN
Start: 2021-08-25 — End: 2021-08-25
  Administered 2021-08-25: 100 mg via INTRAVENOUS

## 2021-08-25 MED ORDER — OXYCODONE HCL 5 MG/5ML PO SOLN: 5.0000 mg | Freq: Once | ORAL | Status: DC | PRN

## 2021-08-25 MED ORDER — LACTATED RINGERS IV SOLN: INTRAVENOUS | Status: DC

## 2021-08-25 MED ORDER — ORAL CARE MOUTH RINSE: 15.0000 mL | Freq: Once | OROMUCOSAL | Status: AC

## 2021-08-25 MED ORDER — HYDROMORPHONE HCL 1 MG/ML IJ SOLN
1.0000 mg | INTRAMUSCULAR | Status: DC | PRN
Start: 2021-08-25 — End: 2021-08-28

## 2021-08-25 MED ORDER — DEXAMETHASONE SODIUM PHOSPHATE 10 MG/ML IJ SOLN
INTRAMUSCULAR | Status: DC | PRN
  Administered 2021-08-25: 10 mg via INTRAVENOUS

## 2021-08-25 MED ORDER — SODIUM CHLORIDE 0.9 % IV SOLN
250.0000 mL | INTRAVENOUS | Status: DC
  Administered 2021-08-26: 250 mL via INTRAVENOUS

## 2021-08-25 MED ORDER — ALBUTEROL SULFATE (2.5 MG/3ML) 0.083% IN NEBU: 3.0000 mL | INHALATION_SOLUTION | RESPIRATORY_TRACT | Status: DC | PRN

## 2021-08-25 MED ORDER — PROPOFOL 10 MG/ML IV BOLUS
INTRAVENOUS | Status: DC | PRN
  Administered 2021-08-25: 150 mg via INTRAVENOUS

## 2021-08-25 MED ORDER — DEXAMETHASONE SODIUM PHOSPHATE 10 MG/ML IJ SOLN
INTRAMUSCULAR | Status: AC
  Filled 2021-08-25: qty 1

## 2021-08-25 MED ORDER — SUGAMMADEX SODIUM 200 MG/2ML IV SOLN
INTRAVENOUS | Status: DC | PRN
  Administered 2021-08-25: 300 mg via INTRAVENOUS

## 2021-08-25 MED ORDER — FENTANYL CITRATE (PF) 250 MCG/5ML IJ SOLN
INTRAMUSCULAR | Status: AC
  Filled 2021-08-25: qty 5

## 2021-08-25 MED ORDER — HYDROMORPHONE HCL 1 MG/ML IJ SOLN
0.2500 mg | INTRAMUSCULAR | Status: DC | PRN
  Administered 2021-08-25 (×2): 0.5 mg via INTRAVENOUS

## 2021-08-25 MED ORDER — ROCURONIUM BROMIDE 10 MG/ML (PF) SYRINGE
PREFILLED_SYRINGE | INTRAVENOUS | Status: DC | PRN
  Administered 2021-08-25: 60 mg via INTRAVENOUS
  Administered 2021-08-25: 30 mg via INTRAVENOUS
  Administered 2021-08-25: 40 mg via INTRAVENOUS

## 2021-08-25 MED ORDER — OXYCODONE HCL 5 MG PO TABS: 5.0000 mg | ORAL_TABLET | Freq: Once | ORAL | Status: DC | PRN

## 2021-08-25 MED ORDER — MIDAZOLAM HCL 2 MG/2ML IJ SOLN
INTRAMUSCULAR | Status: DC | PRN
  Administered 2021-08-25: 2 mg via INTRAVENOUS

## 2021-08-25 MED ORDER — ONDANSETRON HCL 4 MG/2ML IJ SOLN
INTRAMUSCULAR | Status: DC | PRN
Start: 2021-08-25 — End: 2021-08-25
  Administered 2021-08-25: 4 mg via INTRAVENOUS

## 2021-08-25 MED ORDER — SEMAGLUTIDE(0.25 OR 0.5MG/DOS) 2 MG/1.5ML ~~LOC~~ SOPN: 0.5000 mg | PEN_INJECTOR | SUBCUTANEOUS | Status: DC

## 2021-08-25 MED ORDER — BACITRACIN ZINC 500 UNIT/GM EX OINT
TOPICAL_OINTMENT | CUTANEOUS | Status: DC | PRN
  Administered 2021-08-25: 1 via TOPICAL

## 2021-08-25 MED ORDER — ONDANSETRON HCL 4 MG/2ML IJ SOLN
4.0000 mg | Freq: Four times a day (QID) | INTRAMUSCULAR | Status: DC | PRN
  Administered 2021-08-26 (×2): 4 mg via INTRAVENOUS
  Filled 2021-08-25 (×3): qty 2

## 2021-08-25 MED ORDER — THROMBIN 5000 UNITS EX SOLR
CUTANEOUS | Status: AC
  Filled 2021-08-25: qty 5000

## 2021-08-25 MED ORDER — THROMBIN 5000 UNITS EX SOLR: OROMUCOSAL | Status: DC | PRN

## 2021-08-25 MED ORDER — POLYETHYLENE GLYCOL 3350 17 G PO PACK: 17.0000 g | PACK | Freq: Every day | ORAL | Status: DC | PRN

## 2021-08-25 MED ORDER — CEFAZOLIN SODIUM-DEXTROSE 2-4 GM/100ML-% IV SOLN
2.0000 g | Freq: Three times a day (TID) | INTRAVENOUS | Status: AC
  Administered 2021-08-26 (×2): 2 g via INTRAVENOUS
  Filled 2021-08-25 (×2): qty 100

## 2021-08-25 MED ORDER — CHLORHEXIDINE GLUCONATE CLOTH 2 % EX PADS: 6.0000 | MEDICATED_PAD | Freq: Once | CUTANEOUS | Status: DC

## 2021-08-25 MED ORDER — LEVOTHYROXINE SODIUM 75 MCG PO TABS
75.0000 ug | ORAL_TABLET | Freq: Every day | ORAL | Status: DC
  Administered 2021-08-26 – 2021-08-28 (×3): 75 ug via ORAL
  Filled 2021-08-25 (×3): qty 1

## 2021-08-25 MED ORDER — HYDROMORPHONE HCL 1 MG/ML IJ SOLN
INTRAMUSCULAR | Status: AC
  Filled 2021-08-25: qty 1

## 2021-08-25 MED ORDER — DOCUSATE SODIUM 100 MG PO CAPS
100.0000 mg | ORAL_CAPSULE | Freq: Two times a day (BID) | ORAL | Status: DC
  Administered 2021-08-26 – 2021-08-28 (×4): 100 mg via ORAL
  Filled 2021-08-25 (×6): qty 1

## 2021-08-25 MED ORDER — PHENOL 1.4 % MT LIQD: 1.0000 | OROMUCOSAL | Status: DC | PRN

## 2021-08-25 MED ORDER — ONDANSETRON HCL 4 MG/2ML IJ SOLN
4.0000 mg | Freq: Once | INTRAMUSCULAR | Status: AC | PRN
  Administered 2021-08-25: 4 mg via INTRAVENOUS

## 2021-08-25 MED ORDER — ONDANSETRON HCL 4 MG PO TABS: 4.0000 mg | ORAL_TABLET | Freq: Four times a day (QID) | ORAL | Status: DC | PRN

## 2021-08-25 MED ORDER — 0.9 % SODIUM CHLORIDE (POUR BTL) OPTIME
TOPICAL | Status: DC | PRN
  Administered 2021-08-25: 1000 mL

## 2021-08-25 MED ORDER — PHENYLEPHRINE HCL-NACL 20-0.9 MG/250ML-% IV SOLN
INTRAVENOUS | Status: DC | PRN
Start: 2021-08-25 — End: 2021-08-25
  Administered 2021-08-25: 50 ug/min via INTRAVENOUS

## 2021-08-25 MED ORDER — CHLORHEXIDINE GLUCONATE 0.12 % MT SOLN
15.0000 mL | Freq: Once | OROMUCOSAL | Status: AC
  Administered 2021-08-25: 15 mL via OROMUCOSAL
  Filled 2021-08-25: qty 15

## 2021-08-25 MED ORDER — OXYCODONE HCL 5 MG PO TABS
5.0000 mg | ORAL_TABLET | ORAL | Status: DC | PRN
  Administered 2021-08-25 – 2021-08-28 (×11): 5 mg via ORAL
  Filled 2021-08-25 (×10): qty 1

## 2021-08-25 MED ORDER — LIDOCAINE-EPINEPHRINE 2 %-1:100000 IJ SOLN
INTRAMUSCULAR | Status: AC
  Filled 2021-08-25: qty 1

## 2021-08-25 MED ORDER — SUCCINYLCHOLINE CHLORIDE 200 MG/10ML IV SOSY
PREFILLED_SYRINGE | INTRAVENOUS | Status: DC | PRN
  Administered 2021-08-25: 120 mg via INTRAVENOUS

## 2021-08-25 MED ORDER — ATORVASTATIN CALCIUM 10 MG PO TABS
20.0000 mg | ORAL_TABLET | Freq: Every day | ORAL | Status: DC
  Administered 2021-08-26 – 2021-08-27 (×2): 20 mg via ORAL
  Filled 2021-08-25 (×3): qty 2

## 2021-08-25 MED ORDER — ACETAMINOPHEN 10 MG/ML IV SOLN
1000.0000 mg | Freq: Once | INTRAVENOUS | Status: DC | PRN
  Administered 2021-08-25: 1000 mg via INTRAVENOUS

## 2021-08-25 MED ORDER — ACETAMINOPHEN 10 MG/ML IV SOLN
INTRAVENOUS | Status: AC
  Filled 2021-08-25: qty 100

## 2021-08-25 MED ORDER — ACETAMINOPHEN 325 MG PO TABS
650.0000 mg | ORAL_TABLET | ORAL | Status: DC | PRN
  Administered 2021-08-26 – 2021-08-28 (×7): 650 mg via ORAL
  Filled 2021-08-25 (×7): qty 2

## 2021-08-25 MED ORDER — OXYCODONE HCL 5 MG PO TABS
10.0000 mg | ORAL_TABLET | ORAL | Status: DC | PRN
  Administered 2021-08-26: 10 mg via ORAL
  Filled 2021-08-25 (×3): qty 2

## 2021-08-25 MED ORDER — ONDANSETRON HCL 4 MG/2ML IJ SOLN
INTRAMUSCULAR | Status: AC
  Filled 2021-08-25: qty 2

## 2021-08-25 MED ORDER — CEFAZOLIN SODIUM-DEXTROSE 2-4 GM/100ML-% IV SOLN
2.0000 g | INTRAVENOUS | Status: AC
Start: 2021-08-25 — End: 2021-08-25
  Administered 2021-08-25: 2 g via INTRAVENOUS
  Filled 2021-08-25: qty 100

## 2021-08-25 MED ORDER — ACETAMINOPHEN 650 MG RE SUPP: 650.0000 mg | RECTAL | Status: DC | PRN

## 2021-08-25 MED ORDER — AMISULPRIDE (ANTIEMETIC) 5 MG/2ML IV SOLN
10.0000 mg | Freq: Once | INTRAVENOUS | Status: AC
  Administered 2021-08-25: 10 mg via INTRAVENOUS

## 2021-08-25 MED ORDER — SODIUM CHLORIDE 0.9% FLUSH: 3.0000 mL | INTRAVENOUS | Status: DC | PRN

## 2021-08-25 MED ORDER — FUROSEMIDE 20 MG PO TABS: 20.0000 mg | ORAL_TABLET | Freq: Every day | ORAL | Status: DC | PRN

## 2021-08-25 MED ORDER — FENTANYL CITRATE (PF) 250 MCG/5ML IJ SOLN
INTRAMUSCULAR | Status: DC | PRN
  Administered 2021-08-25 (×3): 50 ug via INTRAVENOUS
  Administered 2021-08-25: 100 ug via INTRAVENOUS
  Administered 2021-08-25: 150 ug via INTRAVENOUS
  Administered 2021-08-25 (×3): 50 ug via INTRAVENOUS

## 2021-08-25 SURGICAL SUPPLY — 54 items
ADH SKN CLS APL DERMABOND .7 (GAUZE/BANDAGES/DRESSINGS) ×2
APL SKNCLS STERI-STRIP NONHPOA (GAUZE/BANDAGES/DRESSINGS)
BAG COUNTER SPONGE SURGICOUNT (BAG) ×2 IMPLANT
BAG SPNG CNTER NS LX DISP (BAG) ×1
BENZOIN TINCTURE PRP APPL 2/3 (GAUZE/BANDAGES/DRESSINGS) IMPLANT
BIT DRILL OC (BIT) ×1 IMPLANT
BLADE CLIPPER SURG (BLADE) ×2 IMPLANT
BUR MATCHSTICK NEURO 3.0 LAGG (BURR) ×1 IMPLANT
BUR PRECISION FLUTE 5.0 (BURR) ×2 IMPLANT
CANISTER SUCT 3000ML PPV (MISCELLANEOUS) ×2 IMPLANT
DERMABOND ADVANCED (GAUZE/BANDAGES/DRESSINGS) ×2
DERMABOND ADVANCED .7 DNX12 (GAUZE/BANDAGES/DRESSINGS) ×1 IMPLANT
DRAPE C-ARM 42X72 X-RAY (DRAPES) ×4 IMPLANT
DRAPE LAPAROTOMY 100X72 PEDS (DRAPES) ×2 IMPLANT
DURAPREP 26ML APPLICATOR (WOUND CARE) ×1 IMPLANT
ELECT REM PT RETURN 9FT ADLT (ELECTROSURGICAL) ×2
ELECTRODE REM PT RTRN 9FT ADLT (ELECTROSURGICAL) ×1 IMPLANT
GAUZE 4X4 16PLY ~~LOC~~+RFID DBL (SPONGE) ×1 IMPLANT
GLOVE SURG LTX SZ6.5 (GLOVE) ×1 IMPLANT
GLOVE SURG LTX SZ7.5 (GLOVE) ×4 IMPLANT
GLOVE SURG UNDER POLY LF SZ7 (GLOVE) ×1 IMPLANT
GLOVE SURG UNDER POLY LF SZ7.5 (GLOVE) ×4 IMPLANT
GOWN STRL REUS W/ TWL LRG LVL3 (GOWN DISPOSABLE) IMPLANT
GOWN STRL REUS W/ TWL XL LVL3 (GOWN DISPOSABLE) ×1 IMPLANT
GOWN STRL REUS W/TWL 2XL LVL3 (GOWN DISPOSABLE) IMPLANT
GOWN STRL REUS W/TWL LRG LVL3 (GOWN DISPOSABLE) ×6
GOWN STRL REUS W/TWL XL LVL3 (GOWN DISPOSABLE) ×6
GRAFT BN 10X1XDBM MAGNIFUSE (Bone Implant) IMPLANT
GRAFT BONE MAGNIFUSE 1X10CM (Bone Implant) ×2 IMPLANT
HEMOSTAT POWDER KIT SURGIFOAM (HEMOSTASIS) ×3 IMPLANT
KIT BASIN OR (CUSTOM PROCEDURE TRAY) ×2 IMPLANT
KIT TURNOVER KIT B (KITS) ×2 IMPLANT
NEEDLE HYPO 22GX1.5 SAFETY (NEEDLE) ×2 IMPLANT
NS IRRIG 1000ML POUR BTL (IV SOLUTION) ×2 IMPLANT
PACK LAMINECTOMY NEURO (CUSTOM PROCEDURE TRAY) ×2 IMPLANT
PIN MAYFIELD SKULL DISP (PIN) ×2 IMPLANT
PLATE OC ADJUSTABLE (Plate) ×1 IMPLANT
ROD PRE CVD 3.5X200 (Rod) ×2 IMPLANT
SCREW 4.5X6MM OC (Screw) ×10 IMPLANT
SCREW BN 6X4.5XNS LF SPNE OC (Screw) IMPLANT
SCREW MULTI AXIAL 3.5X14MM (Screw) ×6 IMPLANT
SCREW MULTI AXIAL 3.5X26MM (Screw) ×2 IMPLANT
SET SCREW INFINITY IFIX THOR (Screw) ×10 IMPLANT
SPONGE T-LAP 4X18 ~~LOC~~+RFID (SPONGE) ×1 IMPLANT
STAPLER VISISTAT 35W (STAPLE) ×2 IMPLANT
SUT MNCRL AB 3-0 PS2 18 (SUTURE) ×2 IMPLANT
SUT MON AB 3-0 SH 27 (SUTURE) ×2
SUT MON AB 3-0 SH27 (SUTURE) ×1 IMPLANT
SUT VIC AB 0 CT1 18XCR BRD8 (SUTURE) ×1 IMPLANT
SUT VIC AB 0 CT1 8-18 (SUTURE) ×4
SUT VIC AB 2-0 CP2 18 (SUTURE) ×2 IMPLANT
TOWEL GREEN STERILE (TOWEL DISPOSABLE) ×2 IMPLANT
TOWEL GREEN STERILE FF (TOWEL DISPOSABLE) ×2 IMPLANT
WATER STERILE IRR 1000ML POUR (IV SOLUTION) ×2 IMPLANT

## 2021-08-25 NOTE — Transfer of Care (Signed)
Immediate Anesthesia Transfer of Care Note  Patient: MEILING HENDRIKS  Procedure(s) Performed: Occiput to Cervical 5 Posterior cervical instrumented fusion with open biopsy of Cervical 2 Mass. Cervical Two Ganglionectomy  Patient Location: PACU  Anesthesia Type:General  Level of Consciousness: drowsy  Airway & Oxygen Therapy: Patient Spontanous Breathing and Patient connected to face mask oxygen  Post-op Assessment: Report given to RN, Post -op Vital signs reviewed and stable and Patient moving all extremities  Post vital signs: Reviewed and stable  Last Vitals:  Vitals Value Taken Time  BP 120/63 08/25/21 2005  Temp 37 C 08/25/21 2005  Pulse 106 08/25/21 2010  Resp 17 08/25/21 2010  SpO2 99 % 08/25/21 2010  Vitals shown include unvalidated device data.  Last Pain:  Vitals:   08/25/21 1147  TempSrc:   PainSc: 4       Patients Stated Pain Goal: 3 (89/34/06 8403)  Complications: No notable events documented.

## 2021-08-25 NOTE — Op Note (Signed)
PATIENT: Carol [name]  DAY OF SURGERY: 08/25/21   PRE-OPERATIVE DIAGNOSIS:  Pathologic C2 fracture with instability, left occipital neuralgia   POST-OPERATIVE DIAGNOSIS:  Same   PROCEDURE: C2 laminotomy for open biopsy of mass, left C2 ganglionectomy, occiput to C5 posterior instrumented fusion   SURGEON:  Surgeon(s) and Role:    Judith Part, MD - Primary    Duffy Rhody, MD - Assisting   ANESTHESIA: ETGA   BRIEF HISTORY: This is a 65 year old woman who presented with severe neck pain. 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 Imaging showed a pathologic fracture of the bilateral C2 pars with almost complete replacement of the anterior and posterior aspects of C2 with suspected tumor.  This was discussed with the patient as well as risks, benefits, and alternatives and wished to proceed with surgical stabilization. She also had worsening dysesthetic pain in her left neck and scalp concerning for progressive occipital neuralgia, so today we also discussed a left C2 ganglionectomy, which she also wanted to proceed with.   OPERATIVE DETAIL: The patient was taken to the operating room, anesthesia was induced by the anesthesia team, the mayfield head holder was applied, and she was placed on the OR table in the prone position. A formal time out was performed with two patient identifiers and confirmed the operative site. The operative site was marked, hair was clipped with surgical clippers, the area was then prepped and draped in a sterile fashion. A linear incision was placed from just below the inion to C5. Subperiosteal dissection was performed bilaterally at the same levels.  A C2 laminectomy was performed with a combination of rongeurs, the bone was clearly pathologic. Bone was taken from both pars where abnormal. This was all sent to pathology for further analysis. Pituitary rongeurs were used to remove what looked like obvious tumor in the C2 pars bilaterally, which  was also sent to pathology. Floseal was used to stop the bleeding from the tumor and attention was turned to the ganglionectomy.  The left C2 ganglion was then exposed, isolated, ligated, and cut proximal to the ganglion to complete a left C2 ganglionectomy. Given that it was small and not obviously compressed, I sent it to path as well to see if there was infiltration.   Attention was then turned to instrumentation. The occipital plate was placed and secured with blunt screws. Bilateral C3, C4, and C5 lateral mass screws were placed using standard landmarks with a pilot hole followed by drilling with the CD4 drill, palpating with a ball tipped probe, tapping, palpating again, then placing 1mm screws. Bilateral C1 lateral mass screws were placed with fluoroscopic guidance in the same technique as above, using an 37mm long screw bilaterally with a 32mm long smooth shank. On the left, the C2 root was quite large and had to be sacrificed to allow for instrumentation, so this was performed similarly to how it was performed on the left. Rods were then bent and connected with caps, final tightened, then the fusion surfaces were decorticated from the occiput to C5 bilaterally.  Given that all bone was sent to pathology, I therefore used magnafuse allograft ([employer]) along the fusion surfaces after decorticating.   All instrument and sponge counts were correct, the incision was then closed in layers. The patient was then returned to anesthesia for emergence. No apparent complications at the completion of the procedure.   EBL:  679mL   DRAINS: none   SPECIMENS: C2 bone  biopsy, C2 ganglion   Judith Part, MD 08/25/21 3:04 PM

## 2021-08-25 NOTE — Anesthesia Preprocedure Evaluation (Signed)
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    Pulmonary sleep apnea ,    Pulmonary exam normal breath sounds clear to auscultation       Cardiovascular negative cardio ROS Normal cardiovascular exam Rhythm:Regular Rate:Normal     Neuro/Psych negative neurological ROS  negative psych ROS   GI/Hepatic negative GI ROS, Neg liver ROS,   Endo/Other  diabetes, Type 2Hypothyroidism Morbid obesity  Renal/GU negative Renal ROS  negative genitourinary   Musculoskeletal negative musculoskeletal ROS (+)   Abdominal   Peds negative pediatric ROS (+)  Hematology negative hematology ROS (+)   Anesthesia Other Findings   Reproductive/Obstetrics negative OB ROS                             Anesthesia Physical Anesthesia Plan  ASA: 3  Anesthesia Plan: General   Post-op Pain Management:    Induction: Intravenous  PONV Risk Score and Plan: 3 and Ondansetron, Dexamethasone and Treatment may vary due to age or medical condition  Airway Management Planned: Oral ETT and Video Laryngoscope Planned  Additional Equipment:   Intra-op Plan:   Post-operative Plan: Possible Post-op intubation/ventilation  Informed Consent: I have reviewed the patients History and Physical, chart, labs and discussed the procedure including the risks, benefits and alternatives for the proposed anesthesia with the patient or authorized representative who has indicated his/her understanding and acceptance.     Dental advisory given  Plan Discussed with: CRNA and Surgeon  Anesthesia Plan Comments: (In line stabilization during laryngoscopy)         Anesthesia Quick Evaluation

## 2021-08-25 NOTE — H&P (Signed)
Surgical H&P Update  HPI: 65 y.o. woman with severe neck pain, found to have a C2 mass with pathologic Hangman's frx, still having severe neck pain. Since I last saw her, her neuropathic pain in the left occipital region and around her ear has worsened. No other changes in health since she was last seen.   PMHx:  Past Medical History:  Diagnosis Date   Allergy    Ankle edema    both ankles   Arthritis    hands,knees,elbows   Cancer ([place]) 2016   tumor kidney   Diabetes mellitus    type 2    Eczema    History of kidney stones    Hypothyroidism    Obesity    Sleep apnea    cpap machine setting of 3   Thyroid disease    FamHx:  Family History  Problem Relation Age of Onset   Cancer Father    SocHx:  reports that she has never smoked. She has never used smokeless tobacco. She reports current alcohol use. She reports that she does not use drugs.  Physical Exam: In well-fitting cervical collar Strength 5/5 x4, SILTx4  Assesment/Plan: 65 y.o. woman with pathologic C2 fracture, here for biopsy of C2 mass and O-C5 PSIF. We also discussed today that, given her worsening symptoms, will perform a left C2 ganglionectomy to help with her dysesthetic pain. Risks, benefits, and alternatives discussed and the patient would like to continue with surgery.  -OR today -4NP post-op  Judith Part, MD 08/25/21 2:53 PM

## 2021-08-25 NOTE — Anesthesia Postprocedure Evaluation (Deleted)
Anesthesia Post Note  Patient: Carol [name]  Procedure(s) Performed: Occiput to Cervical 5 Posterior cervical instrumented fusion with open biopsy of Cervical 2 Mass. Cervical Two Ganglionectomy     Anesthesia Post Evaluation No notable events documented.  Last Vitals:  Vitals:   08/25/21 1129  BP: (!) 150/79  Pulse: 98  Resp: 18  Temp: 36.7 C  SpO2: 100%    Last Pain:  Vitals:   08/25/21 1147  TempSrc:   PainSc: 4                  Jovoni Borkenhagen DANIEL

## 2021-08-25 NOTE — Anesthesia Postprocedure Evaluation (Signed)
Anesthesia Post Note  Patient: Carol [name]  Procedure(s) Performed: Occiput to Cervical 5 Posterior cervical instrumented fusion with open biopsy of Cervical 2 Mass. Cervical Two Ganglionectomy     Patient location during evaluation: PACU Anesthesia Type: General Level of consciousness: sedated Pain management: pain level controlled Vital Signs Assessment: post-procedure vital signs reviewed and stable Respiratory status: spontaneous breathing and respiratory function stable Cardiovascular status: stable Postop Assessment: no apparent nausea or vomiting Anesthetic complications: no   No notable events documented.  Last Vitals:  Vitals:   08/25/21 2045 08/25/21 2100  BP: (!) 111/54 (!) 114/52  Pulse: 96 94  Resp: (!) 6 (!) 9  Temp:    SpO2: 92% 97%    Last Pain:  Vitals:   08/25/21 2030  TempSrc:   PainSc: 8                  Lorie Cleckley DANIEL

## 2021-08-26 ENCOUNTER — Encounter (HOSPITAL_COMMUNITY): Payer: BC Managed Care – PPO

## 2021-08-26 MED ORDER — PROCHLORPERAZINE EDISYLATE 10 MG/2ML IJ SOLN
5.0000 mg | Freq: Four times a day (QID) | INTRAMUSCULAR | Status: DC | PRN
  Administered 2021-08-26 – 2021-08-27 (×3): 5 mg via INTRAVENOUS
  Filled 2021-08-26 (×3): qty 2

## 2021-08-26 NOTE — Plan of Care (Signed)

## 2021-08-26 NOTE — Evaluation (Signed)
Physical Therapy Evaluation Patient Details Name: Carol [name] MRN: [id number] DOB: 22-Dec-1955 Today's Date: 08/26/2021  History of Present Illness  65 yo female admitted on 10/6 for C2 laminotomy for open biopsy of mass, left C2 ganglionectomy, occiput to C5 posterior instrumented fusion.  No brace needed, used hardware and bone graft.  PMHx: renal cell CA, ankle edema, OA, DM, sleep apnea, eczema, kidney stones, hypothyroididsm, cpap  Clinical Impression  Pt was seen for mobility and did check of orthostatics, all of which were [facility].  Pt did have elevation of HR during session from 113 at rest to 154 at end of second walk.  Pt reduces rate with sitting rest, however is still a bit elevated even there.  Monitoring her for cervical spine precautions, educating her for sequences and safety, and prompting her for establishing limits of tolerance for all gait and standing work.  Pt is able to feel HR elevation and encouraged her to share with nursing when she feels it.   Follow for acute PT goals, reinforce c-spine precautions, instruct in safety with husband as he is available and plan for HHPT to follow along with her for LE strength and balance training.         Recommendations for follow up therapy are one component of a multi-disciplinary discharge planning process, led by the attending physician.  Recommendations may be updated based on patient status, additional functional criteria and insurance authorization.  Follow Up Recommendations Home health PT;Supervision for mobility/OOB    Equipment Recommendations  Rolling walker with 5" wheels    Recommendations for Other Services       Precautions / Restrictions Precautions Precautions: Fall Precaution Comments: monitor HR, sats Restrictions Weight Bearing Restrictions: No Other Position/Activity Restrictions: cervical spinal precautions reviewed      Mobility  Bed Mobility Overal bed mobility: Needs Assistance Bed Mobility: Supine  to Sit;Sit to Supine     Supine to sit: Min assist;HOB elevated (pt has sleep number bed and pulls up to sit upright) Sit to supine: Min assist   General bed mobility comments: pt is able to sit a bit away from upright bed with min assist adn min to pivot and sit on EOB    Transfers Overall transfer level: Needs assistance Equipment used: Rolling walker (2 wheeled);1 person hand held assist Transfers: Sit to/from Stand Sit to Stand: Min guard;Min assist         General transfer comment: cues for hand placement every trial  Ambulation/Gait Ambulation/Gait assistance: Min assist Gait Distance (Feet): 100 Feet (60+10+30) Assistive device: Rolling walker (2 wheeled);1 person hand held assist;Pushed wheelchair Gait Pattern/deviations: Step-through pattern;Wide base of support;Decreased stride length Gait velocity: reduced Gait velocity interpretation: <1.8 ft/sec, indicate of risk for recurrent falls General Gait Details: tends to drop down mildly on RLE as she fatigues, weakness in hip and knee  Stairs            Wheelchair Mobility    Modified Rankin (Stroke Patients Only)       Balance Overall balance assessment: Needs assistance Sitting-balance support: Feet supported Sitting balance-Leahy Scale: Fair     Standing balance support: Bilateral upper extremity supported;During functional activity Standing balance-Leahy Scale: Poor Standing balance comment: requires RW for support of balance and posture                             Pertinent Vitals/Pain Pain Assessment: Faces Faces Pain Scale: Hurts whole lot Pain Location:  HA, neck shoulders Pain Descriptors / Indicators: Aching;Guarding;Grimacing;Operative site guarding Pain Intervention(s): Monitored during session;Premedicated before session;Repositioned    Home Living Family/patient expects to be discharged to:: Private residence Living Arrangements: Spouse/significant other;Children;Other  relatives Available Help at Discharge: Family Type of Home: House Home Access: Stairs to enter   [employer] of Steps: 3 Home Layout: One level Home Equipment: [place] - 2 wheels;Cane - single point      Prior Function Level of Independence: Independent with assistive device(s)         Comments: recently in pain and more in recliner and bed     Hand Dominance   Dominant Hand: Right    Extremity/Trunk Assessment   Upper Extremity Assessment Upper Extremity Assessment: Defer to OT evaluation    Lower Extremity Assessment Lower Extremity Assessment: RLE deficits/detail RLE Deficits / Details: RLE weakness and giving out with gait RLE Coordination: decreased gross motor    Cervical / Trunk Assessment Cervical / Trunk Assessment: Other exceptions (new fusion occiput to C5, C2 fx with mass excised)  Communication   Communication: No difficulties  Cognition Arousal/Alertness: Awake/alert Behavior During Therapy: WFL for tasks assessed/performed Overall Cognitive Status: Within Functional Limits for tasks assessed                                 General Comments: able to give history to PT and OT      General Comments General comments (skin integrity, edema, etc.): Pt was assisted to get onto side of bed, to s tand and walk with close chair follow and required rest due to HA.  Pt walked to BR, then around room to bed with more stability on RLE by end of session.  However, HR up to 154 with standing and walking.  Pt is nauseated and nursing brought meds to help manage    Exercises     Assessment/Plan    PT Assessment Patient needs continued PT services  PT Problem List Decreased strength;Decreased range of motion;Decreased activity tolerance;Decreased balance;Decreased mobility;Decreased coordination;Decreased cognition;Decreased knowledge of use of DME;Cardiopulmonary status limiting activity;Obesity;Decreased skin integrity;Pain       PT  Treatment Interventions DME instruction;Gait training;Stair training;Functional mobility training;Therapeutic activities;Therapeutic exercise;Balance training;Neuromuscular re-education;Patient/family education    PT Goals (Current goals can be found in the Care Plan section)  Acute Rehab PT Goals Patient Stated Goal: to walk and go home independently PT Goal Formulation: With patient Time For Goal Achievement: 09/02/21 Potential to Achieve Goals: Good    Frequency Min 3X/week   Barriers to discharge Inaccessible home environment home with husband but climbs steps to enter    Co-evaluation PT/OT/SLP Co-Evaluation/Treatment: Yes Reason for Co-Treatment: For patient/therapist safety;To address functional/ADL transfers PT goals addressed during session: Mobility/safety with mobility;Balance;Proper use of DME         AM-PAC PT "6 Clicks" Mobility  Outcome Measure Help needed turning from your back to your side while in a flat bed without using bedrails?: A Little Help needed moving from lying on your back to sitting on the side of a flat bed without using bedrails?: A Lot Help needed moving to and from a bed to a chair (including a wheelchair)?: A Lot Help needed standing up from a chair using your arms (e.g., wheelchair or bedside chair)?: A Little Help needed to walk in hospital room?: A Lot Help needed climbing 3-5 steps with a railing? : Total 6 Click Score: 13  End of Session Equipment Utilized During Treatment: Gait belt Activity Tolerance: Patient limited by fatigue;Treatment limited secondary to medical complications (Comment);Patient limited by pain Patient left: in bed;with call bell/phone within reach;with bed alarm set;with family/visitor present Nurse Communication: Mobility status;Other (comment) (asked for anti nausea meds) PT Visit Diagnosis: Unsteadiness on feet (R26.81);Muscle weakness (generalized) (M62.81);Difficulty in walking, not elsewhere classified  (R26.2);Pain Pain - Right/Left:  (HA, neck) Pain - part of body:  (HA, neck)    Time: 2956-2130 PT Time Calculation (min) (ACUTE ONLY): 36 min   Charges:   PT Evaluation $PT Eval Moderate Complexity: 1 Mod         Ramond Dial 08/26/2021, 12:56 PM  Mee Hives, PT PhD Acute Rehab Dept. Number: [place]

## 2021-08-26 NOTE — Progress Notes (Signed)
   Providing Compassionate, Quality Care - Together  NEUROSURGERY PROGRESS NOTE   S: No issues overnight. Has ambulated to bathroom  O: EXAM:  BP 125/61 (BP Location: Left Arm)   Pulse 93   Temp 98.2 F (36.8 C) (Oral)   Resp 13   Ht 5\' 2"  (1.575 m)   Wt 98.4 kg   SpO2 94%   BMI 39.69 kg/m   Awake, alert, oriented x3 PERRL Speech fluent, appropriate  CNs grossly intact  5/5 BUE/BLE  Incision c/d/i  ASSESSMENT:  65 y.o. female with   C2 Pathologic Mass/Fx  S/p Occ-C5 fusion, C2 biopsy  PLAN: - pt/ot -Pain control -doing well    Thank you for allowing me to participate in this patient's care.  Please do not hesitate to call with questions or concerns.   Elwin Sleight, [facility] Cell: [phone]

## 2021-08-26 NOTE — Evaluation (Signed)
Occupational Therapy Evaluation Patient Details Name: Carol [name] MRN: [id number] DOB: 1956/04/09 Today's Date: 08/26/2021   History of Present Illness 65 yo female admitted on 10/6 for C2 laminotomy for open biopsy of mass, left C2 ganglionectomy, occiput to C5 posterior instrumented fusion.  No brace needed, used hardware and bone graft.  PMHx: renal cell CA, ankle edema, OA, DM, sleep apnea, eczema, kidney stones, hypothyroididsm, cpap   Clinical Impression   This 65 yo female admitted and underwent above presents to acute OT with decreased ability to do own basic ADLs and mobility due to pain. Currently she is setup/S-Mod A for basic ADLs and Min A for basic mobility. She will continue to benefit from acute OT with follow up [place].     Recommendations for follow up therapy are one component of a multi-disciplinary discharge planning process, led by the attending physician.  Recommendations may be updated based on patient status, additional functional criteria and insurance authorization.   Follow Up Recommendations  Home health OT;Supervision/Assistance - 24 hour    Equipment Recommendations  None recommended by OT       Precautions / Restrictions Precautions Precautions: Fall Precaution Comments: monitor HR, sats Restrictions Weight Bearing Restrictions: No Other Position/Activity Restrictions: cervical spinal precautions reviewed      Mobility Bed Mobility Overal bed mobility: Needs Assistance Bed Mobility: Supine to Sit;Sit to Supine     Supine to sit: Min assist;HOB elevated Sit to supine: Min assist   General bed mobility comments: pt is able to sit a bit away from upright bed with min assist and min to pivot and sit on EOB    Transfers Overall transfer level: Needs assistance Equipment used: Rolling walker (2 wheeled) Transfers: Sit to/from Stand Sit to Stand: Min guard;Min assist         General transfer comment: cues for hand placement every trial     Balance Overall balance assessment: Needs assistance Sitting-balance support: Feet supported Sitting balance-Leahy Scale: Fair     Standing balance support: Bilateral upper extremity supported;During functional activity Standing balance-Leahy Scale: Poor Standing balance comment: requires RW for support of balance and posture                           ADL either performed or assessed with clinical judgement   ADL Overall ADL's : Needs assistance/impaired Eating/Feeding: Set up;Sitting;Bed level   Grooming: Wash/dry hands;Standing;Min guard   Upper Body Bathing: Set up;Supervision/ safety;Sitting   Lower Body Bathing: Moderate assistance Lower Body Bathing Details (indicate cue type and reason): min A sit<>stand Upper Body Dressing : Minimal assistance;Sitting   Lower Body Dressing: Moderate assistance Lower Body Dressing Details (indicate cue type and reason): min A sit<>stand Toilet Transfer: Minimal assistance;Ambulation;RW;Comfort height toilet;Grab bars   Toileting- Clothing Manipulation and Hygiene: Sit to/from stand;Minimal assistance               Vision Patient Visual Report: No change from baseline              Pertinent Vitals/Pain Pain Assessment: Faces Faces Pain Scale: Hurts whole lot Pain Location: HA, neck shoulders Pain Descriptors / Indicators: Aching;Guarding;Grimacing;Operative site guarding Pain Intervention(s): Limited activity within patient's tolerance;Monitored during session;Premedicated before session;Repositioned     Hand Dominance Right   Extremity/Trunk Assessment Upper Extremity Assessment Upper Extremity Assessment: Overall WFL for tasks assessed   Lower Extremity Assessment Lower Extremity Assessment: RLE deficits/detail RLE Deficits / Details: RLE weakness and giving out with  gait RLE Coordination: decreased gross motor   Cervical / Trunk Assessment Cervical / Trunk Assessment: Other exceptions (new fusion  occiput to C5, C2 fx with mass excised)   Communication Communication Communication: No difficulties   Cognition Arousal/Alertness: Awake/alert Behavior During Therapy: WFL for tasks assessed/performed Overall Cognitive Status: Within Functional Limits for tasks assessed                                    General Comments  Pt was assisted to get onto side of bed, to stand and walk with close chair follow and required rest due to HA.  Pt walked to BR, then around room to bed with more stability on RLE by end of session.  However, HR up to 154 with standing and walking.  Pt is nauseated and nursing brought meds to help manage            Home Living Family/patient expects to be discharged to:: Private residence Living Arrangements: Spouse/significant other;Children;Other relatives Available Help at Discharge: Family;Available 24 hours/day Type of Home: House Home Access: Stairs to enter [employer] of Steps: 3 Entrance Stairs-Rails: Right Home Layout: One level     Bathroom Shower/Tub: Occupational psychologist: Standard     Home Equipment: Environmental consultant - 2 wheels;Cane - single point;Bedside commode;Shower seat          Prior Functioning/Environment Level of Independence: Independent with assistive device(s)        Comments: recently in pain and more in recliner and bed        OT Problem List: Decreased strength;Impaired balance (sitting and/or standing);Pain      OT Treatment/Interventions: Self-care/ADL training;DME and/or AE instruction;Patient/family education;Balance training    OT Goals(Current goals can be found in the care plan section) Acute Rehab OT Goals Patient Stated Goal: to get back to doing everything for myself again OT Goal Formulation: With patient Time For Goal Achievement: 09/09/21 Potential to Achieve Goals: Good  OT Frequency: Min 2X/week           Co-evaluation PT/OT/SLP Co-Evaluation/Treatment:  Yes Reason for Co-Treatment: For patient/therapist safety;To address functional/ADL transfers PT goals addressed during session: Mobility/safety with mobility;Balance;Proper use of DME OT goals addressed during session: ADL's and self-care;Strengthening/ROM      AM-PAC OT "6 Clicks" Daily Activity     Outcome Measure Help from another person eating meals?: A Little Help from another person taking care of personal grooming?: A Little Help from another person toileting, which includes using toliet, bedpan, or urinal?: A Little Help from another person bathing (including washing, rinsing, drying)?: A Lot Help from another person to put on and taking off regular upper body clothing?: A Little Help from another person to put on and taking off regular lower body clothing?: A Lot 6 Click Score: 16   End of Session Equipment Utilized During Treatment: Gait belt;Rolling walker Nurse Communication: Mobility status  Activity Tolerance: Patient tolerated treatment well Patient left: in bed;with call bell/phone within reach;with bed alarm set  OT Visit Diagnosis: Unsteadiness on feet (R26.81);Other abnormalities of gait and mobility (R26.89);Pain Pain - part of body:  (neck and shoulders)                Time: 7106-2694 OT Time Calculation (min): 30 min Charges:  OT General Charges $OT Visit: 1 Visit OT Evaluation $OT Eval Moderate Complexity: [place], OTR/L Acute [facility] [phone]  Office [phone]    Almon Register 08/26/2021, 2:55 PM

## 2021-08-26 NOTE — Progress Notes (Signed)
Pt continue to have nausea and prn Zofran ineffective, Dr. Reatha Armour notified and new order received. Will continue to closely monitor pt. Francis Gaines Jadaya Sommerfield RN.

## 2021-08-27 NOTE — Progress Notes (Signed)
   Providing Compassionate, Quality Care - Together  NEUROSURGERY PROGRESS NOTE   S: No issues overnight. Improving overall  O: EXAM:  BP 91/65 (BP Location: Left Arm)   Pulse 99   Temp 98.2 F (36.8 C) (Oral)   Resp 16   Ht 5\' 2"  (1.575 m)   Wt 98.4 kg   SpO2 98%   BMI 39.69 kg/m   Awake, alert, oriented x3 PERRL Speech fluent, appropriate  CNs grossly intact  5/5 BUE/BLE  Incision c/d/i  ASSESSMENT:  65 y.o. female with   Pathologic C2 Fx  S/p Occ-C5 fusion, C2 biopsy  PLAN: - pain control - pt - dc planning tomorrow    Thank you for allowing me to participate in this patient's care.  Please do not hesitate to call with questions or concerns.   Elwin Sleight, [facility] Cell: [phone]

## 2021-08-27 NOTE — Progress Notes (Addendum)
Physical Therapy Treatment Patient Details Name: Carol [name] MRN: [id number] DOB: [birth year] Today's Date: 08/27/2021   History of Present Illness 65 yo female admitted on 10/6 for C2 laminotomy for open biopsy of mass, left C2 ganglionectomy, occiput to C5 posterior instrumented fusion.  No brace needed, used hardware and bone graft.  PMHx: renal cell CA, ankle edema, OA, DM, sleep apnea, eczema, kidney stones, hypothyroididsm, cpap    PT Comments    Pt progressing well towards her physical therapy goals, requiring less assist overall for mobility this session and demonstrates improved gait speed. Pt ambulating 100 feet with a walker at a supervision level. Of note, pt HR 122 bpm at rest - 154 bpm with mobility; discussed with RN. Stair training deferred in light of elevated HR. Pt reports she does not have concerns about doing the steps with her husband assist. Verbally reviewed car transfer technique and cervical precautions. Pt/pt spouse with no further questions at this time.    Recommendations for follow up therapy are one component of a multi-disciplinary discharge planning process, led by the attending physician.  Recommendations may be updated based on patient status, additional functional criteria and insurance authorization.  Follow Up Recommendations  Home health PT;Supervision for mobility/OOB     Equipment Recommendations  Rolling walker with 5" wheels    Recommendations for Other Services       Precautions / Restrictions Precautions Precautions: Cervical;Fall;Other (comment) Precaution Comments: watch HR Restrictions Weight Bearing Restrictions: No     Mobility  Bed Mobility Overal bed mobility: Needs Assistance Bed Mobility: Rolling;Sidelying to Sit;Sit to Sidelying Rolling: Supervision Sidelying to sit: Min assist     Sit to sidelying: Min assist General bed mobility comments: MinA for trunk elevation to upright, HOB elevated    Transfers Overall  transfer level: Needs assistance Equipment used: Rolling walker (2 wheeled) Transfers: Sit to/from Stand Sit to Stand: Supervision         General transfer comment: cues for hand placement  Ambulation/Gait Ambulation/Gait assistance: Supervision Gait Distance (Feet): 100 Feet Assistive device: Rolling walker (2 wheeled) Gait Pattern/deviations: Step-through pattern;Decreased stride length     General Gait Details: steady pace, no gross imbalance noted   Stairs             Wheelchair Mobility    Modified Rankin (Stroke Patients Only)       Balance Overall balance assessment: Needs assistance Sitting-balance support: Feet supported Sitting balance-Leahy Scale: Good     Standing balance support: Bilateral upper extremity supported;During functional activity Standing balance-Leahy Scale: Poor Standing balance comment: requires RW for support of balance and posture                            Cognition Arousal/Alertness: Awake/alert Behavior During Therapy: WFL for tasks assessed/performed Overall Cognitive Status: Within Functional Limits for tasks assessed                                        Exercises      General Comments        Pertinent Vitals/Pain Pain Assessment: Faces Faces Pain Scale: Hurts even more Pain Location: HA, neck shoulders Pain Descriptors / Indicators: Aching;Guarding;Grimacing;Operative site guarding Pain Intervention(s): Limited activity within patient's tolerance;Monitored during session;Patient requesting pain meds-RN notified    Home Living  Prior Function            PT Goals (current goals can now be found in the care plan section) Acute Rehab PT Goals Patient Stated Goal: to get back to doing everything for myself again Potential to Achieve Goals: Good Progress towards PT goals: Progressing toward goals    Frequency    Min 3X/week      PT Plan  Current plan remains appropriate    Co-evaluation              AM-PAC PT "6 Clicks" Mobility   Outcome Measure  Help needed turning from your back to your side while in a flat bed without using bedrails?: A Little Help needed moving from lying on your back to sitting on the side of a flat bed without using bedrails?: A Little Help needed moving to and from a bed to a chair (including a wheelchair)?: A Little Help needed standing up from a chair using your arms (e.g., wheelchair or bedside chair)?: A Little Help needed to walk in hospital room?: A Little Help needed climbing 3-5 steps with a railing? : A Lot 6 Click Score: 17    End of Session   Activity Tolerance: Patient tolerated treatment well Patient left: in bed;with call bell/phone within reach;with family/visitor present Nurse Communication: Mobility status PT Visit Diagnosis: Unsteadiness on feet (R26.81);Muscle weakness (generalized) (M62.81);Difficulty in walking, not elsewhere classified (R26.2);Pain     Time: 5397-6734 PT Time Calculation (min) (ACUTE ONLY): 18 min  Charges:  $Therapeutic Activity: 8-22 mins                       Ellamae Sia, PT, DPT Acute Rehabilitation Services Pager [phone] Office [phone]    Deno Etienne 08/27/2021, 3:43 PM

## 2021-08-27 NOTE — Progress Notes (Signed)
Patient used the call bell to ask for assistance. The writer went in the room to assist patient. Per patient  and spouse, " patient have gone more than 4 hrs without her pain medication. According to Patient and spouse, "every other nurse has been bringing the pain medication every 4 hrs" The writer tried to explain/education patient that the pain medication is not scheduled, it is a PRN,meaning it's given as needed, patient have to ask for it, nurse can not just get the pain medication, wake patient up every 4hrs and administer.Per patient;s husband, "he will see to it that the pain is scheduled,and will like the doctor to take care of the writer for not administering the pain medication every 4hrs PRN.

## 2021-08-28 MED ORDER — OXYCODONE HCL 5 MG PO TABS: 5.0000 mg | ORAL_TABLET | ORAL | 0 refills | Status: DC | PRN

## 2021-08-28 MED ORDER — CYCLOBENZAPRINE HCL 10 MG PO TABS: 10.0000 mg | ORAL_TABLET | Freq: Three times a day (TID) | ORAL | 1 refills | Status: DC | PRN

## 2021-08-28 NOTE — Progress Notes (Signed)
Occupational Therapy Treatment and Discharge Patient Details Name: Carol [name] MRN: [id number] DOB: 1956/08/02 Today's Date: 08/28/2021   History of present illness 65 yo female admitted on 10/6 for C2 laminotomy for open biopsy of mass, left C2 ganglionectomy, occiput to C5 posterior instrumented fusion.  No brace needed, used hardware and bone graft.  PMHx: renal cell CA, ankle edema, OA, DM, sleep apnea, eczema, kidney stones, hypothyroididsm, cpap   OT comments  This 64 yo female seen today to go over getting dressed and use of AE, all education completed, we will D/C from acute OT.   Recommendations for follow up therapy are one component of a multi-disciplinary discharge planning process, led by the attending physician.  Recommendations may be updated based on patient status, additional functional criteria and insurance authorization.    Follow Up Recommendations  Home health OT;Supervision/Assistance - 24 hour    Equipment Recommendations  None recommended by OT       Precautions / Restrictions Precautions Precautions: Cervical;Fall Restrictions Other Position/Activity Restrictions: cervical spinal precautions reviewed       Mobility Bed Mobility Overal bed mobility: Modified Independent Bed Mobility: Supine to Sit;Sit to Supine     Supine to sit: Modified independent (Device/Increase time);HOB elevated Sit to supine: Modified independent (Device/Increase time);HOB elevated        Transfers Overall transfer level: Independent Equipment used: None Transfers: Sit to/from Stand Sit to Stand: Modified independent (Device/Increase time) (increased time)              Balance Overall balance assessment: Mild deficits observed, not formally tested (in standing)                                         ADL either performed or assessed with clinical judgement   ADL Overall ADL's : Modified independent                 Upper Body Dressing  : Modified independent;Sitting   Lower Body Dressing: Modified independent;With adaptive equipment;Sit to/from stand   Toilet Transfer: Modified Independent;Ambulation;Comfort height toilet;Grab bars   Toileting- Clothing Manipulation and Hygiene: Sit to/from stand;Modified independent         General ADL Comments: Educated on use of reacher to doff socks and sock aid to donn socks, handout for AE provided     Vision Patient Visual Report: No change from baseline            Cognition Arousal/Alertness: Awake/alert Behavior During Therapy: WFL for tasks assessed/performed Overall Cognitive Status: Within Functional Limits for tasks assessed                                                     Pertinent Vitals/ Pain       Pain Assessment: No/denies pain            Progress Toward Goals  OT Goals(current goals can now be found in the care plan section)  Progress towards OT goals: Goals met/education completed, patient discharged from OT  Acute Rehab OT Goals Patient Stated Goal: to go home today OT Goal Formulation: With patient Time For Goal Achievement: 09/09/21 Potential to Achieve Goals: Good  Plan Discharge plan remains appropriate       AM-PAC OT "  6 Clicks" Daily Activity     Outcome Measure   Help from another person eating meals?: None Help from another person taking care of personal grooming?: None Help from another person toileting, which includes using toliet, bedpan, or urinal?: None Help from another person bathing (including washing, rinsing, drying)?: None Help from another person to put on and taking off regular upper body clothing?: None Help from another person to put on and taking off regular lower body clothing?: None 6 Click Score: 24    End of Session    OT Visit Diagnosis: Unsteadiness on feet (R26.81);Other abnormalities of gait and mobility (R26.89)   Activity Tolerance Patient tolerated treatment well    Patient Left in bed;with call bell/phone within reach;with family/visitor present   Nurse Communication  (Pt dressed and ready to go)        Time: 1599-6895 OT Time Calculation (min): 14 min  Charges: OT General Charges $OT Visit: 1 Visit OT Treatments $Self Care/Home Management : 8-22 mins  [place], OTR/L Acute [employer] Pager [phone] Office [phone]    Almon Register 08/28/2021, 12:41 PM

## 2021-08-28 NOTE — TOC Transition Note (Signed)
Transition of Care [facility]) - CM/SW Discharge Note   Patient Details  Name: Carol [name] MRN: [id number] Date of Birth: 10-10-1956  [facility] CM/SW Contact:  Konrad Penta, RN Phone Number: [phone] 08/28/2021, 11:46 AM   Clinical Narrative:   Patient slated for transition to home today. Spoke with spouse who confirms the plan is for home health PT/OT and will need rolling walker. No preference of home health. Cory with [facility] can accept referral.   Rolling walker to be delivered to room prior to dc.   Nursing made aware.     Final next level of care: [place] Barriers to Discharge: No Barriers Identified   Patient Goals and CMS Choice Patient states their goals for this hospitalization and ongoing recovery are:: return home   Choice offered to / list presented to : Spouse  Discharge Placement                Discharge Plan and Services                DME Arranged: Walker rolling DME Agency: [employer] Date DME Agency Contacted: 08/28/21 Time DME Agency Contacted: 1638 Representative spoke with at DME Agency: [place]: PT, OT [place] Agency: [place] Date [place]: 08/28/21 Time [place]: 37 Representative spoke with at [place]: [place] ([place]) Interventions     Readmission Risk Interventions No flowsheet data found.

## 2021-08-28 NOTE — Progress Notes (Signed)
Pt with discharge orders. Discharger paperwork reviewed with pt and spouse and all questions answered. IV removed prior to shift. Prescriptions electronically faxed to pharmacy. Pt escorted out via wheelchair with all belongings and walker to private vehicle.

## 2021-08-28 NOTE — Discharge Summary (Signed)
Physician Discharge Summary  Patient ID: Carol [name] MRN: [id number] DOB/AGE: Nov 27, 1955 65 y.o.  Admit date: 08/25/2021 Discharge date: 08/28/2021  Admission Diagnoses:  C2 pathologic fracture  Discharge Diagnoses:  Same Active Problems:   Spinal stenosis in cervical region   Closed cervical spine fracture [facility])   Discharged Condition: Stable  Hospital Course:  Carol [name] is a 65 y.o. female that had a C2 pathologic fracture and underwent a occipital cervical fusion to C5 with biopsy of her pathologic fracture at C2.  She tolerated surgery well.  Postoperatively her pain was controlled, she was ambulating independently.  Her wound was healing appropriately.  She was having normal bowel bladder function upon discharge.  She was at her neurologic baseline.  Treatments: Surgery -occipital cervical 5 fusion with biopsy of C2  Discharge Exam: Blood pressure 126/61, pulse 99, temperature 97.8 F (36.6 C), temperature source Oral, resp. rate 19, height 5\' 2"  (1.575 m), weight 98.4 kg, SpO2 97 %. Awake, alert, oriented x3 PERRLA Speech fluent, appropriate CN grossly intact 5/5 BUE/BLE Wound c/d/i  Disposition: Discharge disposition: 01-Home or Self Care        Allergies as of 08/28/2021       Reactions   Morphine Nausea And Vomiting        Medication List     STOP taking these medications    traMADol 50 MG tablet Commonly known as: Ultram       TAKE these medications    acetaminophen 500 MG tablet Commonly known as: TYLENOL Take 1,000 mg by mouth every 6 (six) hours as needed.   atorvastatin 20 MG tablet Commonly known as: LIPITOR Take 20 mg by mouth at bedtime.   cyclobenzaprine 10 MG tablet Commonly known as: FLEXERIL Take 1 tablet (10 mg total) by mouth 3 (three) times daily as needed for muscle spasms.   furosemide 20 MG tablet Commonly known as: LASIX Take 20 mg by mouth daily as needed for fluid or edema.   levothyroxine 75 MCG  tablet Commonly known as: SYNTHROID Take 75 mcg by mouth daily before breakfast.   metFORMIN 1000 MG tablet Commonly known as: GLUCOPHAGE Take 1,000 mg by mouth daily with breakfast.   ondansetron 4 MG disintegrating tablet Commonly known as: Zofran ODT Take 1 tablet (4 mg total) by mouth every 8 (eight) hours as needed for nausea or vomiting.   oxyCODONE 5 MG immediate release tablet Commonly known as: Oxy IR/ROXICODONE Take 1 tablet (5 mg total) by mouth every 4 (four) hours as needed for moderate pain ((score 4 to 6)). What changed:  how much to take when to take this reasons to take this   Ozempic (0.25 or 0.5 MG/DOSE) 2 MG/1.5ML Sopn Generic drug: Semaglutide(0.25 or 0.5MG /DOS) Inject 0.5 mg into the skin every Saturday.   ProAir HFA 108 (90 Base) MCG/ACT inhaler Generic drug: albuterol Inhale 2 puffs into the lungs every 4 (four) hours as needed for wheezing or shortness of breath. Uses seasonal               Durable Medical Equipment  (From admission, onward)           Start     Ordered   08/28/21 1124  For home use only DME Walker rolling  Once       Question Answer Comment  Walker: With 5 Inch Wheels   Patient needs a walker to treat with the following condition Weakness      08/28/21 1124  Follow-up Information     Judith Part, MD Follow up in 2 week(s).   Specialty: Neurosurgery Contact information: [place] [address] [phone]         Care, [facility] Follow up.   Specialty: Home Health Services Why: [facility] PT/OT services. [facility] will contact you to schedule visit. Contact information: [address] [place] [address] [phone]                 Signed: Theodoro Doing Jacque Byron 08/28/2021, 11:40 AM

## 2021-08-29 ENCOUNTER — Inpatient Hospital Stay: Payer: BC Managed Care – PPO

## 2021-08-29 ENCOUNTER — Encounter (HOSPITAL_COMMUNITY): Payer: Self-pay | Admitting: Neurological Surgery

## 2021-08-29 MED FILL — Thrombin For Soln 5000 Unit: CUTANEOUS | Qty: 5000 | Status: AC

## 2021-09-01 LAB — SURGICAL PATHOLOGY

## 2021-09-05 ENCOUNTER — Inpatient Hospital Stay: Payer: BC Managed Care – PPO | Attending: Radiation Oncology

## 2021-09-05 DIAGNOSIS — Z905 Acquired absence of kidney: Secondary | ICD-10-CM | POA: Insufficient documentation

## 2021-09-05 DIAGNOSIS — C9030 Solitary plasmacytoma not having achieved remission: Secondary | ICD-10-CM | POA: Insufficient documentation

## 2021-09-05 DIAGNOSIS — E039 Hypothyroidism, unspecified: Secondary | ICD-10-CM | POA: Insufficient documentation

## 2021-09-05 DIAGNOSIS — Z85528 Personal history of other malignant neoplasm of kidney: Secondary | ICD-10-CM | POA: Insufficient documentation

## 2021-09-06 ENCOUNTER — Telehealth: Payer: Self-pay | Admitting: Hematology and Oncology

## 2021-09-06 ENCOUNTER — Other Ambulatory Visit: Payer: Self-pay | Admitting: Radiation Therapy

## 2021-09-06 DIAGNOSIS — C9000 Multiple myeloma not having achieved remission: Secondary | ICD-10-CM

## 2021-09-06 NOTE — Telephone Encounter (Signed)
Scheduled appt per 10/18 referral. Pt is aware of appt date and time.

## 2021-09-08 ENCOUNTER — Ambulatory Visit
Admission: RE | Admit: 2021-09-08 | Discharge: 2021-09-08 | Disposition: A | Discharge status: Home / Self Care | Payer: BC Managed Care – PPO | Source: Ambulatory Visit | Attending: Radiation Oncology | Admitting: Radiation Oncology

## 2021-09-08 ENCOUNTER — Other Ambulatory Visit: Payer: Self-pay

## 2021-09-08 ENCOUNTER — Other Ambulatory Visit: Payer: Self-pay | Admitting: Urology

## 2021-09-08 ENCOUNTER — Other Ambulatory Visit (HOSPITAL_COMMUNITY): Payer: Self-pay | Admitting: Urology

## 2021-09-08 ENCOUNTER — Ambulatory Visit
Admission: RE | Admit: 2021-09-08 | Discharge: 2021-09-08 | Disposition: A | Discharge status: Home / Self Care | Payer: BC Managed Care – PPO | Source: Ambulatory Visit | Attending: Urology | Admitting: Urology

## 2021-09-08 DIAGNOSIS — C9000 Multiple myeloma not having achieved remission: Secondary | ICD-10-CM | POA: Diagnosis not present

## 2021-09-08 DIAGNOSIS — C642 Malignant neoplasm of left kidney, except renal pelvis: Secondary | ICD-10-CM

## 2021-09-08 DIAGNOSIS — C7951 Secondary malignant neoplasm of bone: Secondary | ICD-10-CM

## 2021-09-08 NOTE — Progress Notes (Addendum)
  Radiation Oncology         [phone]) [phone] ________________________________  Name: Carol [name] MRN: [id number]  Date: 09/08/2021  DOB: December 21, 1955  SIMULATION AND TREATMENT PLANNING NOTE    ICD-10-CM   1. Multiple myeloma without remission ([place])  C90.00       DIAGNOSIS:  65 yo woman s/p laminectomy and fixation for C2 spinal multiple myeloma  NARRATIVE:  The patient was brought to the [place].  Identity was confirmed.  All relevant records and images related to the planned course of therapy were reviewed.  The patient freely provided informed written consent to proceed with treatment after reviewing the details related to the planned course of therapy. The consent form was witnessed and verified by the simulation staff.  Then, the patient was set-up in a stable reproducible  supine position for radiation therapy.  CT images were obtained.  Surface markings were placed.  The CT images were loaded into the planning software.  Then the target and avoidance structures were contoured.  Treatment planning then occurred.  The radiation prescription was entered and confirmed.  Then, I designed and supervised the construction multiple medically necessary complex treatment device including a custom made thermoplastic mask used for immobilization, and two MLC collimator apertures for radiotherapy from the right and left side, with independent collimation for each to account for beam divergence.  I have requested : 3D Plan with [facility] for target, spinal cord, parotids  PLAN:  The C2 cervical spine inclusive of hardware will be treated to 20 Gy in 10 fractions.  ________________________________  Sheral Apley Tammi Klippel, M.D.

## 2021-09-09 ENCOUNTER — Encounter (HOSPITAL_COMMUNITY): Payer: Self-pay | Admitting: Neurological Surgery

## 2021-09-09 ENCOUNTER — Ambulatory Visit: Payer: BC Managed Care – PPO | Admitting: Radiation Oncology

## 2021-09-12 ENCOUNTER — Other Ambulatory Visit: Payer: Self-pay

## 2021-09-12 ENCOUNTER — Inpatient Hospital Stay: Payer: BC Managed Care – PPO

## 2021-09-12 ENCOUNTER — Inpatient Hospital Stay (HOSPITAL_BASED_OUTPATIENT_CLINIC_OR_DEPARTMENT_OTHER): Payer: BC Managed Care – PPO | Admitting: Hematology and Oncology

## 2021-09-12 VITALS — BP 132/74 | HR 100 | Temp 98.5°F | Resp 18 | Wt 214.0 lb

## 2021-09-12 DIAGNOSIS — C9000 Multiple myeloma not having achieved remission: Secondary | ICD-10-CM

## 2021-09-12 DIAGNOSIS — Z85528 Personal history of other malignant neoplasm of kidney: Secondary | ICD-10-CM | POA: Diagnosis not present

## 2021-09-12 DIAGNOSIS — C9030 Solitary plasmacytoma not having achieved remission: Secondary | ICD-10-CM | POA: Diagnosis present

## 2021-09-12 DIAGNOSIS — E039 Hypothyroidism, unspecified: Secondary | ICD-10-CM | POA: Diagnosis not present

## 2021-09-12 DIAGNOSIS — Z905 Acquired absence of kidney: Secondary | ICD-10-CM | POA: Diagnosis not present

## 2021-09-12 LAB — CMP (CANCER CENTER ONLY)
ALT: 15 U/L (ref 0–44)
AST: 17 U/L (ref 15–41)
Albumin: 4.1 g/dL (ref 3.5–5.0)
Alkaline Phosphatase: 68 U/L (ref 38–126)
Anion gap: 10 (ref 5–15)
BUN: 13 mg/dL (ref 8–23)
CO2: 23 mmol/L (ref 22–32)
Calcium: 9.3 mg/dL (ref 8.9–10.3)
Chloride: 108 mmol/L (ref 98–111)
Creatinine: 0.86 mg/dL (ref 0.44–1.00)
GFR, Estimated: >60 mL/min (ref >60)
Glucose, Bld: 129 mg/dL — ABNORMAL HIGH (ref 70–99)
Potassium: 3.9 mmol/L (ref 3.5–5.1)
Sodium: 141 mmol/L (ref 135–145)
Total Bilirubin: 0.7 mg/dL (ref 0.3–1.2)
Total Protein: 7.3 g/dL (ref 6.5–8.1)

## 2021-09-12 LAB — CBC WITH DIFFERENTIAL (CANCER CENTER ONLY)
Abs Immature Granulocytes: 0.02 10*3/uL (ref 0.00–0.07)
Basophils Absolute: 0 10*3/uL (ref 0.0–0.1)
Basophils Relative: 1 %
Eosinophils Absolute: 0.1 10*3/uL (ref 0.0–0.5)
Eosinophils Relative: 2 %
HCT: 30.5 % — ABNORMAL LOW (ref 36.0–46.0)
Hemoglobin: 10.1 g/dL — ABNORMAL LOW (ref 12.0–15.0)
Immature Granulocytes: 1 %
Lymphocytes Relative: 28 %
Lymphs Abs: 1.2 10*3/uL (ref 0.7–4.0)
MCH: 30.3 pg (ref 26.0–34.0)
MCHC: 33.1 g/dL (ref 30.0–36.0)
MCV: 91.6 fL (ref 80.0–100.0)
Monocytes Absolute: 0.4 10*3/uL (ref 0.1–1.0)
Monocytes Relative: 9 %
Neutro Abs: 2.6 10*3/uL (ref 1.7–7.7)
Neutrophils Relative %: 59 %
Platelet Count: 286 10*3/uL (ref 150–400)
RBC: 3.33 MIL/uL — ABNORMAL LOW (ref 3.87–5.11)
RDW: 14.4 % (ref 11.5–15.5)
WBC Count: 4.3 10*3/uL (ref 4.0–10.5)
nRBC: 0 % (ref 0.0–0.2)

## 2021-09-12 LAB — LACTATE DEHYDROGENASE: LDH: 149 U/L (ref 98–192)

## 2021-09-12 NOTE — Progress Notes (Signed)
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  09/12/2021: establish care with Dr. Lorenso Courier   CHIEF COMPLAINTS/PURPOSE OF CONSULTATION:  "Plasmacytoma "  HISTORY OF PRESENTING ILLNESS:  Carol [name] 65 y.o. female with medical history significant for renal cell carcinoma status post resection, type 2 diabetes, sleep apnea, thyroid disease, eczema, and arthritis who presents for evaluation of a concerning plasma cell lesion in her spine.  On review of the previous records Carol [name] underwent a CT scan of her neck on 07/29/2021 which showed an expansile lytic lesion almost completely replacing the C2 vertebra.  On 08/25/2019 the patient underwent a PET CT scan which showed metabolic activity at C2 and C7.  On 08/26/2019 the patient underwent neurosurgical procedure with a biopsy of the C2 lesion taken which was found to be consistent with a plasma cell neoplasm.  Due to concern for these findings the patient was referred to hematology for further evaluation and management.  On exam today Carol [name] reports that for the last 2 years she has been having difficulty with her neck and has been seeing neurosurgery.  She was thought that she had degenerative disc disease and underwent shots in her discs.  She has disease were quite effective and helps the pain go away.  In July 2022 the pain came back considerably worse and conservative measures were not effective.  That is when she underwent a CT scan which led her to the  current diagnosis of a plasma cell lesion.  Patient does endorse a history of renal cancer for which she underwent a left partial nephrectomy in 2016 and a full nephrectomy in 2018.  She has this was performed with [facility].  Fortunately for her current lesion she is connected with radiation oncology who is planning 10 fractions of radiation to the lesion.  On further discussion she notes that her family history is remarkable for bladder cancer in her father as well as arrhythmia.  She notes that she has 2 children who are currently healthy.  The patient is a never smoker and drinks "very little alcohol".  She is currently a Freight forwarder at [employer].  She currently notes that her pain is under good control and she is not currently having any pain in the neck.  She does have some residual numbness in her head.  A full 10 point ROS is listed below.  MEDICAL HISTORY:  Past Medical History:  Diagnosis Date   Allergy    Ankle edema    both ankles   Arthritis    hands,knees,elbows   Cancer ([place]) 2016   tumor kidney   Diabetes mellitus    type 2    Eczema    History of kidney stones    Hypothyroidism    Obesity    Sleep apnea    cpap machine setting of 3   Thyroid disease     SURGICAL HISTORY: Past Surgical History:  Procedure Laterality Date   BREAST BIOPSY Right 2001   CHOLECYSTECTOMY  2009   open   COLONOSCOPY     CYSTOSCOPY  W/ RETROGRADES Left 11/22/2018   Procedure: CYSTOSCOPY WITH RETROGRADE PYELOGRAM;  Surgeon: Ardis Hughs, MD;  Location: [facility];  Service: Urology;  Laterality: Left;   DILATATION & CURRETTAGE/HYSTEROSCOPY WITH RESECTOCOPE N/A 01/21/2013   Procedure: DILATATION & CURETTAGE/HYSTEROSCOPY WITH RESECTOCOPE AND RESECTION OF ENDOMETRIAL;  Surgeon: Selinda Orion, MD;  Location: [facility];  Service: Gynecology;  Laterality: N/A;   KNEE ARTHROSCOPY     left    POLYPECTOMY     POSTERIOR CERVICAL FUSION/FORAMINOTOMY N/A 08/25/2021    Procedure: Occiput to Cervical 5 Posterior cervical instrumented fusion with open biopsy of Cervical 2 Mass. Cervical Two Ganglionectomy;  Surgeon: Judith Part, MD;  Location: [place];  Service: Neurosurgery;  Laterality: N/A;   ROBOT ASSISTED PYELOPLASTY Left 11/22/2018   Procedure: XI ROBOTIC ASSISTED ATTEMPTED PYELOPLASTY CONVERTED TO NEPHRECTOMY/LYSIS OF ADHESIONS/UMBILICAL HERNIA REPAIR;  Surgeon: Ardis Hughs, MD;  Location: [facility];  Service: Urology;  Laterality: Left;   ROBOTIC ASSITED PARTIAL NEPHRECTOMY Left 06/11/2015   Procedure: LEFT ROBOTIC ASSISTED LAPAROSCOPIC  PARTIAL NEPHRECTOMY;  Surgeon: Ardis Hughs, MD;  Location: [facility];  Service: Urology;  Laterality: Left;   ROTATOR CUFF REPAIR Left 2011   URETERAL BIOPSY Left 02/14/2017   Procedure: URETERAL BIOPSY;  Surgeon: Ardis Hughs, MD;  Location: [facility];  Service: Urology;  Laterality: Left;   URETEROSCOPY WITH HOLMIUM LASER LITHOTRIPSY Left 02/14/2017   Procedure: URETEROSCOPY LITHOTRIPSY, URETERAL BALLOON DILATION;  Surgeon: Ardis Hughs, MD;  Location: [facility];  Service: Urology;  Laterality: Left;  With STENT    SOCIAL HISTORY: Social History   Socioeconomic History   Marital status: Married    Spouse name: Not on file   Number of children: Not on file   Years of education: Not on file   Highest education level: Not on file  Occupational History   Not on file  Tobacco Use   Smoking status: Never   Smokeless tobacco: Never  Vaping Use   Vaping Use: Never used  Substance and Sexual Activity   Alcohol use: Yes    Comment: occ   Drug use: No   Sexual activity: Not on file  Other Topics Concern   Not on file  Social History Narrative   Not on file   Social Determinants of Health   Financial Resource Strain: Not on file  Food Insecurity: Not on file  Transportation Needs: Not on file  Physical Activity: Not on file  Stress: Not on file  Social Connections: Not on file  Intimate  Partner Violence: Not on file    FAMILY HISTORY: Family History  Problem Relation Age of Onset   Cancer Father     ALLERGIES:  is allergic to morphine.  MEDICATIONS:  Current Outpatient Medications  Medication Sig Dispense Refill   acetaminophen (TYLENOL) 500 MG tablet Take 1,000 mg by mouth every 6 (six) hours as needed.     Ascorbic Acid (VITAMIN C ADULT GUMMIES PO) Take by mouth.     atorvastatin (LIPITOR) 20 MG tablet Take 20 mg by mouth at bedtime.     Cholecalciferol (VITAMIN D3) 25 MCG (1000 UT) CAPS Take by mouth.     furosemide (LASIX) 20 MG tablet Take 20 mg by mouth daily as needed for fluid or edema.     levothyroxine (SYNTHROID) 75 MCG tablet Take 75 mcg by mouth daily before breakfast.     metFORMIN (GLUCOPHAGE) 1000 MG tablet Take 1,000 mg by mouth daily with breakfast.  OZEMPIC, 0.25 OR 0.5 MG/DOSE, 2 MG/1.5ML SOPN Inject 0.5 mg into the skin every Saturday.     ondansetron (ZOFRAN ODT) 4 MG disintegrating tablet Take 1 tablet (4 mg total) by mouth every 8 (eight) hours as needed for nausea or vomiting. (Patient not taking: No sig reported) 12 tablet 1   oxyCODONE (OXY IR/ROXICODONE) 5 MG immediate release tablet Take 1 tablet (5 mg total) by mouth every 4 (four) hours as needed for moderate pain ((score 4 to 6)). (Patient not taking: Reported on 09/12/2021) 30 tablet 0   PROAIR HFA 108 (90 BASE) MCG/ACT inhaler Inhale 2 puffs into the lungs every 4 (four) hours as needed for wheezing or shortness of breath. Uses seasonal (Patient not taking: Reported on 09/12/2021)  3   No current facility-administered medications for this visit.    REVIEW OF SYSTEMS:   Constitutional: ( - ) fevers, ( - )  chills , ( - ) night sweats Eyes: ( - ) blurriness of vision, ( - ) double vision, ( - ) watery eyes Ears, nose, mouth, throat, and face: ( - ) mucositis, ( - ) sore throat Respiratory: ( - ) cough, ( - ) dyspnea, ( - ) wheezes Cardiovascular: ( - ) palpitation, ( - ) chest  discomfort, ( - ) lower extremity swelling Gastrointestinal:  ( - ) nausea, ( - ) heartburn, ( - ) change in bowel habits Skin: ( - ) abnormal skin rashes Lymphatics: ( - ) new lymphadenopathy, ( - ) easy bruising Neurological: ( - ) numbness, ( - ) tingling, ( - ) new weaknesses Behavioral/Psych: ( - ) mood change, ( - ) new changes  All other systems were reviewed with the patient and are negative.  PHYSICAL EXAMINATION: ECOG PERFORMANCE STATUS: 1 - Symptomatic but completely ambulatory  Vitals:   09/12/21 0911  BP: 132/74  Pulse: 100  Resp: 18  Temp: 98.5 F (36.9 C)  SpO2: 100%   Filed Weights   09/12/21 0911  Weight: 214 lb (97.1 kg)    GENERAL: well appearing middle-aged Caucasian female in NAD  SKIN: skin color, texture, turgor are normal, no rashes or significant lesions EYES: conjunctiva are pink and non-injected, sclera clear OROPHARYNX: no exudate, no erythema; lips, buccal mucosa, and tongue normal  NECK: Wearing a neck brace LUNGS: clear to auscultation and percussion with normal breathing effort HEART: regular rate & rhythm and no murmurs and no lower extremity edema Musculoskeletal: no cyanosis of digits and no clubbing  PSYCH: alert & oriented x 3, fluent speech NEURO: no focal motor/sensory deficits  LABORATORY DATA:  I have reviewed the data as listed CBC Latest Ref Rng & Units 09/12/2021 08/25/2021 11/25/2018  WBC 4.0 - 10.5 K/uL 4.3 3.9(L) 5.9  Hemoglobin 12.0 - 15.0 g/dL 10.1(L) 14.3 8.2(L)  Hematocrit 36.0 - 46.0 % 30.5(L) 43.2 25.0(L)  Platelets 150 - 400 K/uL 286 176 131(L)    CMP Latest Ref Rng & Units 09/12/2021 08/25/2021 08/18/2021  Glucose 70 - 99 mg/dL 129(H) 124(H) -  BUN 8 - 23 mg/dL 13 15 -  Creatinine 0.44 - 1.00 mg/dL 0.86 0.93 1.00  Sodium 135 - 145 mmol/L 141 141 -  Potassium 3.5 - 5.1 mmol/L 3.9 3.8 -  Chloride 98 - 111 mmol/L 108 109 -  CO2 22 - 32 mmol/L 23 22 -  Calcium 8.9 - 10.3 mg/dL 9.3 9.6 -  Total Protein 6.5 - 8.1 g/dL  7.3 - -  Total Bilirubin 0.3 - 1.2 mg/dL  0.7 - -  Alkaline Phos 38 - 126 U/L 68 - -  AST 15 - 41 U/L 17 - -  ALT 0 - 44 U/L 15 - -     PATHOLOGY: SURGICAL PATHOLOGY  CASE: [id number]  PATIENT: Leiliana Ehrler  Surgical Pathology Report   Clinical History: closed nondisplaced fracture of second cervical  vertebra (cm)   FINAL MICROSCOPIC DIAGNOSIS:   A. SOFT TISSUE, CERVICAL TWO, EXCISION:  -Benign nerve tissue   B. BONE, CERVICAL TWO, BIOPSY:  -Findings consistent with plasma cell neoplasm  -See comment   COMMENT:   The sections show fragments of bone and bone marrow displaying  effacement of the medullary space by large clusters and sheets of  atypical plasma cells.  Scattered small foci of residual bone marrow  with a mixture of myeloid cell types are seen.  To further evaluate this  process, a battery of immunohistochemical stains was performed with  appropriate controls.  The plasma cells are positive for CD138 (diffuse)  with partial weak positivity for CD79a, cyclin D1 and Mum 1.  There is  partial positivity for LCA, and scattered cells are positive for CD56.  No significant staining is seen with CD20, CD34, PAX5, CD3, CD5, CD43,  myeloperoxidase, chromogranin, synaptophysin, S100, cytokeratin AE1/AE3,  or cytokeratin 8/18.  Despite repeated staining, no appreciable  positivity is seen with kappa or lambda light chains likely due to the  decalcification process.  Nonetheless, the overall morphologic and  phenotypic features are consistent with plasma cell neoplasm.  Hematologic evaluation including a bone marrow biopsy is recommended.   GROSS DESCRIPTION:   A: Received fresh is a 0.8 x 0.6 x 0.5 cm firm tan nodule.  The specimen  is bisected and entirely submitted in 1 cassette.   B: Received fresh multiple irregular fragments of tan-red bone measuring  2.5 x 2 x 0.7 cm in aggregate.  The specimen is entirely submitted in 2  cassettes following  decalcification.  [facility] 08/26/2021)    Final Diagnosis performed by Susanne Greenhouse, MD.   Electronically signed  09/01/2021    RADIOGRAPHIC STUDIES: I have personally reviewed the radiological images as listed and agreed with the findings in the report. DG Cervical Spine 2 or 3 views  Result Date: 08/25/2021 CLINICAL DATA:  Surgery EXAM: CERVICAL SPINE - 2-3 VIEW COMPARISON:  CT 07/29/2021 FINDINGS: Single lateral view of the cervical spine obtained intraoperatively. IMPRESSION: Intraoperative fluoroscopic assistance provided during cervical surgery Electronically Signed   By: Donavan Foil M.D.   On: 08/25/2021 20:18   CT Chest W Contrast  Result Date: 08/18/2021 CLINICAL DATA:  Outside MRI showed cervical spine lesions. History of left nephrectomy for renal cell cancer. EXAM: CT CHEST WITH CONTRAST TECHNIQUE: Multidetector CT imaging of the chest was performed during intravenous contrast administration. CONTRAST:  77m OMNIPAQUE IOHEXOL 350 MG/ML SOLN COMPARISON:  Prior radiographs dating back to 2017 FINDINGS: Cardiovascular: The heart is normal in size. No pericardial effusion. The aorta is normal in caliber. No dissection. Minimal scattered atherosclerotic calcifications. The branch vessels are patent. Minimal scattered coronary artery calcifications. Mediastinum/Nodes: No mediastinal or hilar mass or lymphadenopathy. The esophagus is grossly normal. The thyroid gland is unremarkable. Lungs/Pleura: Smoothly marginated well circumscribed left upper lobe pulmonary nodule measuring 9.5 mm. It appears to have few speckled calcifications and possibly some macroscopic fat. A hamartoma would be a distinct possibility. This is unchanged since multiple prior chest x-rays and is considered benign. No other pulmonary lesions are identified. There are few  tiny scattered subpleural nodules which are likely benign nodes. No infiltrates, edema or effusions. No interstitial lung disease or bronchiectasis. Upper  Abdomen: No significant upper abdominal findings. Status post left nephrectomy. No hepatic or adrenal gland lesions. No upper abdominal adenopathy. Musculoskeletal: No breast masses, supraclavicular or axillary adenopathy. The thoracic vertebral bodies are grossly normal. Suspect very small lytic lesions in the sternum and ribs. Multiple myeloma would be a possibility. IMPRESSION: 1. Stable 9.5 mm left upper lobe pulmonary nodule. This is considered benign. 2. No mediastinal or hilar mass or adenopathy. 3. Suspect very small lytic lesions in the sternum and ribs. Multiple myeloma would be a possibility. 4. Status post left nephrectomy. No findings for upper abdominal metastatic disease. 5. Aortic atherosclerosis. Aortic Atherosclerosis (ICD10-I70.0). Electronically Signed   By: Marijo Sanes M.D.   On: 08/18/2021 17:54   NM PET Image Restag (PS) Skull Base To Thigh  Result Date: 08/24/2021 CLINICAL DATA:  Initial treatment strategy for metastatic disease suspected with cervical spine mass and lytic changes elsewhere. History of renal cell carcinoma. EXAM: NUCLEAR MEDICINE PET SKULL BASE TO THIGH TECHNIQUE: 10.7 mCi F-18 FDG was injected intravenously. Full-ring PET imaging was performed from the skull base to thigh after the radiotracer. CT data was obtained and used for attenuation correction and anatomic localization. Fasting blood glucose: 120 mg/dl COMPARISON:  Chest CT August 18, 2021 and multiple prior abdominal CTs. FINDINGS: Mediastinal blood pool activity: SUV max 3.75 Liver activity: SUV max NA NECK: No hypermetabolic lymph nodes in the neck. Incidental CT findings: none CHEST: No hypermetabolic mediastinal or hilar nodes. No suspicious pulmonary nodules on the CT scan. Incidental CT findings: Aortic atherosclerosis of the thoracic aorta. No aneurysmal dilation. Normal heart size without substantial pericardial effusion. Normal caliber of the central pulmonary vessels. Limited assessment of  cardiovascular structures given lack of intravenous contrast. Paste that ABDOMEN/PELVIS: No abnormal hypermetabolic activity within the liver, pancreas, adrenal glands, or spleen. No hypermetabolic lymph nodes in the abdomen or pelvis. Post LEFT nephrectomy. Splenule in the area of the nephrectomy bed adjacent to the spleen. Incidental CT findings: Post cholecystectomy. No acute findings relative to liver, pancreas, spleen, adrenal glands or RIGHT kidney. Urinary bladder collapsed. Colonic diverticulosis. Appendix is normal. Aortic atherosclerosis without aneurysm. Fat necrosis in the LEFT retroperitoneum decreasing in size over time. No adnexal mass. SKELETON: Destructive process in the cervical spine at the C2 level with increased metabolic activity with a maximum SUV of 6.1 better displayed on previous spinal MRI At the C7 level is an area of increased metabolic activity on the LEFT (image 37/4) this is without CT imaging correlate again area better displayed on the prior MRI likely associated with focal bony involvement and displaying a maximum SUV of 5.4. Multifocal lytic lesions in the sternum and elsewhere in the chest are better displayed on the recent chest CT which is of higher, diagnostic quality. There is heterogeneous marrow uptake with scattered areas of more focal uptake, for instance in the distal RIGHT clavicle the setting of acromioclavicular degenerative change is an area of increased metabolic activity with a maximum SUV of 4.3 Incidental CT findings: Multifocal lytic changes quite subtle and small along the cortices of the many of the visualized bones. Best demonstrated on previous chest CT, resolution of current CT imaging is not as effective for detection of the subtle lesions IMPRESSION: Increased metabolic activity at C2 and C7 as described. Heterogeneous marrow uptake is nonspecific but given other findings on the current study findings suggest  diffuse marrow involvement. Subtle areas of  variable uptake or scattered throughout ribs and sternum as well. Findings would favor a process such as myeloma over metastatic disease though sampling is suggested. Could consider MR imaging of the remainder of the spine and or pelvis to select alternative site for biopsy (as opposed to the C2 level) as warranted. Post LEFT nephrectomy and cholecystectomy. Fat necrosis in the LEFT retroperitoneum. Benign-appearing pulmonary nodule in the LEFT upper lobe is stable over a series of prior exams. Aortic Atherosclerosis (ICD10-I70.0). Electronically Signed   By: Zetta Bills M.D.   On: 08/24/2021 15:56    ASSESSMENT & PLAN Carol [name] 65 y.o. female with medical history significant for renal cell carcinoma status post resection, type 2 diabetes, sleep apnea, thyroid disease, eczema, and arthritis who presents for evaluation of a concerning plasma cell lesion in her spine.  After review of the labs, review of the records, and discussion with the patient the patients findings are most consistent with a plasma cell neoplasm.  It is unclear at this time if this represents an isolated plasmacytoma versus widespread multiple myeloma.  The treatment for these 2 would be different.  If the patient is found to have an isolated plasmacytoma radiation therapy alone be effective.  The patient has multiple myeloma radiation therapy to the plasmacytoma would be palliative in nature, though she would require systemic chemotherapy in order to treat the systemic disease.  We will order a bone marrow biopsy in order to help parse this out.  Additionally we will order serum studies and urine studies today to look for systemic disease.  # Plasma Cell Neoplasm --today will order an SPEP, UPEP, SFLC and beta 2 microglobulin --additionally will collect new baseline CBC, CMP, and LDH --PET scan performed, no need for a metastatic bone survey to assess for lytic lesions --will plan to schedule a bone marrow biopsy to determine  if this is an isolated plasmacytoma vs multiple myeloma -- Agree with radiation therapy to the plasmacytoma --Return to clinic following the bone marrow biopsy to discuss results and steps moving forward.   Orders Placed This Encounter  Procedures   CT BIOPSY    Standing Status:   Future    Standing Expiration Date:   09/12/2022    Order Specific Question:   Lab orders requested (DO NOT place separate lab orders, these will be automatically ordered during procedure specimen collection):    Answer:   Surgical Pathology    Order Specific Question:   Reason for Exam (SYMPTOM  OR DIAGNOSIS REQUIRED)    Answer:   concern for multiple myeloma, requesting bone marrow biopsy    Order Specific Question:   Preferred location?    Answer:   [facility]   CT BONE MARROW BIOPSY & ASPIRATION    Standing Status:   Future    Standing Expiration Date:   09/12/2022    Order Specific Question:   Reason for Exam (SYMPTOM  OR DIAGNOSIS REQUIRED)    Answer:   concern for multiple myeloma, requesting bone marrow biopsy    Order Specific Question:   Preferred location?    Answer:   [facility]   CBC with Differential ([place] Only)    Standing Status:   Future    Number of Occurrences:   1    Standing Expiration Date:   09/12/2022   CMP ([place] only)    Standing Status:   Future    Number of Occurrences:  1    Standing Expiration Date:   09/12/2022   Lactate dehydrogenase (LDH)    Standing Status:   Future    Number of Occurrences:   1    Standing Expiration Date:   09/12/2022   Multiple Myeloma Panel (SPEP&IFE w/QIG)    Standing Status:   Future    Number of Occurrences:   1    Standing Expiration Date:   09/12/2022   Kappa/lambda light chains    Standing Status:   Future    Number of Occurrences:   1    Standing Expiration Date:   09/12/2022   Beta 2 microglobulin    Standing Status:   Future    Number of Occurrences:   1    Standing Expiration Date:    09/12/2022   24-Hr Ur UPEP/UIFE/Light Chains/TP    Standing Status:   Future    Standing Expiration Date:   09/12/2022    All questions were answered. The patient knows to call the clinic with any problems, questions or concerns.  A total of more than 60 minutes were spent on this encounter with face-to-face time and non-face-to-face time, including preparing to see the patient, ordering tests and/or medications, counseling the patient and coordination of care as outlined above.   Ledell Peoples, MD Department of Hematology/Oncology [facility] Phone: [phone] Pager: [phone] Email: [email or web address]  09/13/2021 5:14 PM

## 2021-09-13 DIAGNOSIS — C9000 Multiple myeloma not having achieved remission: Secondary | ICD-10-CM | POA: Diagnosis not present

## 2021-09-13 LAB — BETA 2 MICROGLOBULIN, SERUM: Beta-2 Microglobulin: 3.3 mg/L — ABNORMAL HIGH (ref 0.6–2.4)

## 2021-09-13 LAB — KAPPA/LAMBDA LIGHT CHAINS
Kappa free light chain: 280.3 mg/L — ABNORMAL HIGH (ref 3.3–19.4)
Kappa, lambda light chain ratio: 19.74 — ABNORMAL HIGH (ref 0.26–1.65)
Lambda free light chains: 14.2 mg/L (ref 5.7–26.3)

## 2021-09-14 LAB — MULTIPLE MYELOMA PANEL, SERUM
Albumin SerPl Elph-Mcnc: 3.6 g/dL (ref 2.9–4.4)
Albumin/Glob SerPl: 1.4 (ref 0.7–1.7)
Alpha 1: 0.3 g/dL (ref 0.0–0.4)
Alpha2 Glob SerPl Elph-Mcnc: 0.9 g/dL (ref 0.4–1.0)
B-Globulin SerPl Elph-Mcnc: 1.1 g/dL (ref 0.7–1.3)
Gamma Glob SerPl Elph-Mcnc: 0.4 g/dL (ref 0.4–1.8)
Globulin, Total: 2.7 g/dL (ref 2.2–3.9)
IgA: 93 mg/dL (ref 87–352)
IgG (Immunoglobin G), Serum: 489 mg/dL — ABNORMAL LOW (ref 586–1602)
IgM (Immunoglobulin M), Srm: 43 mg/dL (ref 26–217)
Total Protein ELP: 6.3 g/dL (ref 6.0–8.5)

## 2021-09-15 ENCOUNTER — Other Ambulatory Visit: Payer: Self-pay | Admitting: *Deleted

## 2021-09-15 ENCOUNTER — Ambulatory Visit
Admission: RE | Admit: 2021-09-15 | Discharge: 2021-09-15 | Disposition: A | Discharge status: Home / Self Care | Payer: BC Managed Care – PPO | Source: Ambulatory Visit | Attending: Radiation Oncology | Admitting: Radiation Oncology

## 2021-09-15 DIAGNOSIS — C9000 Multiple myeloma not having achieved remission: Secondary | ICD-10-CM

## 2021-09-15 DIAGNOSIS — C9030 Solitary plasmacytoma not having achieved remission: Secondary | ICD-10-CM | POA: Diagnosis not present

## 2021-09-16 ENCOUNTER — Ambulatory Visit
Admission: RE | Admit: 2021-09-16 | Discharge: 2021-09-16 | Disposition: A | Discharge status: Home / Self Care | Payer: BC Managed Care – PPO | Source: Ambulatory Visit | Attending: Radiation Oncology | Admitting: Radiation Oncology

## 2021-09-16 ENCOUNTER — Other Ambulatory Visit: Payer: Self-pay

## 2021-09-16 DIAGNOSIS — C9000 Multiple myeloma not having achieved remission: Secondary | ICD-10-CM | POA: Diagnosis not present

## 2021-09-19 ENCOUNTER — Ambulatory Visit
Admission: RE | Admit: 2021-09-19 | Discharge: 2021-09-19 | Disposition: A | Discharge status: Home / Self Care | Payer: BC Managed Care – PPO | Source: Ambulatory Visit | Attending: Radiation Oncology | Admitting: Radiation Oncology

## 2021-09-19 DIAGNOSIS — C9000 Multiple myeloma not having achieved remission: Secondary | ICD-10-CM | POA: Diagnosis not present

## 2021-09-19 LAB — UPEP/UIFE/LIGHT CHAINS/TP, 24-HR UR
% BETA, Urine: 0 %
ALPHA 1 URINE: 0 %
Albumin, U: 0 %
Alpha 2, Urine: 0 %
Free Kappa Lt Chains,Ur: 17.58 mg/L (ref 1.17–86.46)
Free Kappa/Lambda Ratio: 12.12 (ref 1.83–14.26)
Free Lambda Lt Chains,Ur: 1.45 mg/L (ref 0.27–15.21)
GAMMA GLOBULIN URINE: 0 %
Total Protein, Urine-Ur/day: <58 mg/24 hr (ref 30–150)
Total Protein, Urine: <4 mg/dL
Total Volume: 1450

## 2021-09-20 ENCOUNTER — Ambulatory Visit
Admission: RE | Admit: 2021-09-20 | Discharge: 2021-09-20 | Disposition: A | Discharge status: Home / Self Care | Payer: BC Managed Care – PPO | Source: Ambulatory Visit | Attending: Radiation Oncology | Admitting: Radiation Oncology

## 2021-09-20 ENCOUNTER — Other Ambulatory Visit: Payer: Self-pay

## 2021-09-20 DIAGNOSIS — C9000 Multiple myeloma not having achieved remission: Secondary | ICD-10-CM | POA: Insufficient documentation

## 2021-09-21 ENCOUNTER — Ambulatory Visit
Admission: RE | Admit: 2021-09-21 | Discharge: 2021-09-21 | Disposition: A | Discharge status: Home / Self Care | Payer: BC Managed Care – PPO | Source: Ambulatory Visit | Attending: Radiation Oncology | Admitting: Radiation Oncology

## 2021-09-21 DIAGNOSIS — C9000 Multiple myeloma not having achieved remission: Secondary | ICD-10-CM | POA: Diagnosis not present

## 2021-09-22 ENCOUNTER — Ambulatory Visit
Admission: RE | Admit: 2021-09-22 | Discharge: 2021-09-22 | Disposition: A | Discharge status: Home / Self Care | Payer: BC Managed Care – PPO | Source: Ambulatory Visit | Attending: Radiation Oncology | Admitting: Radiation Oncology

## 2021-09-22 ENCOUNTER — Other Ambulatory Visit: Payer: Self-pay

## 2021-09-22 DIAGNOSIS — C9000 Multiple myeloma not having achieved remission: Secondary | ICD-10-CM | POA: Diagnosis not present

## 2021-09-23 ENCOUNTER — Other Ambulatory Visit: Payer: Self-pay

## 2021-09-23 ENCOUNTER — Ambulatory Visit
Admission: RE | Admit: 2021-09-23 | Discharge: 2021-09-23 | Disposition: A | Discharge status: Home / Self Care | Payer: BC Managed Care – PPO | Source: Ambulatory Visit | Attending: Radiation Oncology | Admitting: Radiation Oncology

## 2021-09-23 DIAGNOSIS — C9000 Multiple myeloma not having achieved remission: Secondary | ICD-10-CM | POA: Diagnosis not present

## 2021-09-26 ENCOUNTER — Other Ambulatory Visit: Payer: Self-pay

## 2021-09-26 ENCOUNTER — Ambulatory Visit
Admission: RE | Admit: 2021-09-26 | Discharge: 2021-09-26 | Disposition: A | Discharge status: Home / Self Care | Payer: BC Managed Care – PPO | Source: Ambulatory Visit | Attending: Radiation Oncology | Admitting: Radiation Oncology

## 2021-09-26 DIAGNOSIS — C9000 Multiple myeloma not having achieved remission: Secondary | ICD-10-CM | POA: Diagnosis not present

## 2021-09-27 ENCOUNTER — Ambulatory Visit
Admission: RE | Admit: 2021-09-27 | Discharge: 2021-09-27 | Disposition: A | Discharge status: Home / Self Care | Payer: BC Managed Care – PPO | Source: Ambulatory Visit | Attending: Radiation Oncology | Admitting: Radiation Oncology

## 2021-09-27 DIAGNOSIS — C9000 Multiple myeloma not having achieved remission: Secondary | ICD-10-CM | POA: Diagnosis not present

## 2021-09-28 ENCOUNTER — Ambulatory Visit
Admission: RE | Admit: 2021-09-28 | Discharge: 2021-09-28 | Disposition: A | Discharge status: Home / Self Care | Payer: BC Managed Care – PPO | Source: Ambulatory Visit | Attending: Radiation Oncology | Admitting: Radiation Oncology

## 2021-09-28 ENCOUNTER — Other Ambulatory Visit: Payer: Self-pay

## 2021-09-28 ENCOUNTER — Encounter: Payer: Self-pay | Admitting: Urology

## 2021-09-28 DIAGNOSIS — C9000 Multiple myeloma not having achieved remission: Secondary | ICD-10-CM | POA: Diagnosis not present

## 2021-10-02 ENCOUNTER — Other Ambulatory Visit: Payer: Self-pay

## 2021-10-02 ENCOUNTER — Ambulatory Visit (HOSPITAL_COMMUNITY)
Admission: EM | Admit: 2021-10-02 | Discharge: 2021-10-03 | Disposition: A | Discharge status: Home / Self Care | Payer: BC Managed Care – PPO | Attending: Emergency Medicine | Admitting: Emergency Medicine

## 2021-10-02 DIAGNOSIS — D649 Anemia, unspecified: Secondary | ICD-10-CM | POA: Insufficient documentation

## 2021-10-02 DIAGNOSIS — G4733 Obstructive sleep apnea (adult) (pediatric): Secondary | ICD-10-CM | POA: Diagnosis not present

## 2021-10-02 DIAGNOSIS — N200 Calculus of kidney: Secondary | ICD-10-CM | POA: Diagnosis present

## 2021-10-02 DIAGNOSIS — M1990 Unspecified osteoarthritis, unspecified site: Secondary | ICD-10-CM | POA: Diagnosis not present

## 2021-10-02 DIAGNOSIS — Z85528 Personal history of other malignant neoplasm of kidney: Secondary | ICD-10-CM | POA: Insufficient documentation

## 2021-10-02 DIAGNOSIS — Z20822 Contact with and (suspected) exposure to covid-19: Secondary | ICD-10-CM | POA: Insufficient documentation

## 2021-10-02 DIAGNOSIS — Z905 Acquired absence of kidney: Secondary | ICD-10-CM | POA: Insufficient documentation

## 2021-10-02 DIAGNOSIS — Z7984 Long term (current) use of oral hypoglycemic drugs: Secondary | ICD-10-CM | POA: Diagnosis not present

## 2021-10-02 DIAGNOSIS — E119 Type 2 diabetes mellitus without complications: Secondary | ICD-10-CM | POA: Insufficient documentation

## 2021-10-03 ENCOUNTER — Emergency Department (HOSPITAL_COMMUNITY): Payer: BC Managed Care – PPO

## 2021-10-03 ENCOUNTER — Encounter (HOSPITAL_COMMUNITY): Payer: Self-pay

## 2021-10-03 ENCOUNTER — Emergency Department (HOSPITAL_COMMUNITY): Payer: BC Managed Care – PPO | Admitting: Anesthesiology

## 2021-10-03 ENCOUNTER — Encounter (HOSPITAL_COMMUNITY)
Admission: EM | Disposition: A | Discharge status: Home / Self Care | Payer: Self-pay | Source: Home / Self Care | Attending: Emergency Medicine

## 2021-10-03 HISTORY — PX: CYSTOSCOPY WITH RETROGRADE PYELOGRAM, URETEROSCOPY AND STENT PLACEMENT: SHX5789

## 2021-10-03 LAB — CBC WITH DIFFERENTIAL/PLATELET
Abs Immature Granulocytes: 0.04 10*3/uL (ref 0.00–0.07)
Basophils Absolute: 0 10*3/uL (ref 0.0–0.1)
Basophils Relative: 1 %
Eosinophils Absolute: 0.1 10*3/uL (ref 0.0–0.5)
Eosinophils Relative: 1 %
HCT: 36.7 % (ref 36.0–46.0)
Hemoglobin: 12.4 g/dL (ref 12.0–15.0)
Immature Granulocytes: 1 %
Lymphocytes Relative: 18 %
Lymphs Abs: 1.4 10*3/uL (ref 0.7–4.0)
MCH: 30.6 pg (ref 26.0–34.0)
MCHC: 33.8 g/dL (ref 30.0–36.0)
MCV: 90.6 fL (ref 80.0–100.0)
Monocytes Absolute: 0.7 10*3/uL (ref 0.1–1.0)
Monocytes Relative: 8 %
Neutro Abs: 5.8 10*3/uL (ref 1.7–7.7)
Neutrophils Relative %: 71 %
Platelets: 217 10*3/uL (ref 150–400)
RBC: 4.05 MIL/uL (ref 3.87–5.11)
RDW: 13.9 % (ref 11.5–15.5)
WBC: 8.1 10*3/uL (ref 4.0–10.5)
nRBC: 0 % (ref 0.0–0.2)

## 2021-10-03 LAB — COMPREHENSIVE METABOLIC PANEL
ALT: 17 U/L (ref 0–44)
AST: 16 U/L (ref 15–41)
Albumin: 4.2 g/dL (ref 3.5–5.0)
Alkaline Phosphatase: 83 U/L (ref 38–126)
Anion gap: 10 (ref 5–15)
BUN: 16 mg/dL (ref 8–23)
CO2: 26 mmol/L (ref 22–32)
Calcium: 9.6 mg/dL (ref 8.9–10.3)
Chloride: 101 mmol/L (ref 98–111)
Creatinine, Ser: 1.16 mg/dL — ABNORMAL HIGH (ref 0.44–1.00)
GFR, Estimated: 52 mL/min — ABNORMAL LOW (ref >60)
Glucose, Bld: 202 mg/dL — ABNORMAL HIGH (ref 70–99)
Potassium: 3.8 mmol/L (ref 3.5–5.1)
Sodium: 137 mmol/L (ref 135–145)
Total Bilirubin: 1.1 mg/dL (ref 0.3–1.2)
Total Protein: 7.1 g/dL (ref 6.5–8.1)

## 2021-10-03 LAB — RESP PANEL BY RT-PCR (FLU A&B, COVID) ARPGX2
Influenza A by PCR: NEGATIVE
Influenza B by PCR: NEGATIVE
SARS Coronavirus 2 by RT PCR: NEGATIVE

## 2021-10-03 LAB — GLUCOSE, CAPILLARY
Glucose-Capillary: 115 mg/dL — ABNORMAL HIGH (ref 70–99)
Glucose-Capillary: 99 mg/dL (ref 70–99)

## 2021-10-03 LAB — URINALYSIS, MICROSCOPIC (REFLEX): RBC / HPF: >50 RBC/hpf (ref 0–5)

## 2021-10-03 SURGERY — CYSTOURETEROSCOPY, WITH RETROGRADE PYELOGRAM AND STENT INSERTION
Anesthesia: General | Laterality: Right

## 2021-10-03 MED ORDER — FENTANYL CITRATE (PF) 100 MCG/2ML IJ SOLN
INTRAMUSCULAR | Status: DC | PRN
  Administered 2021-10-03: 50 ug via INTRAVENOUS

## 2021-10-03 MED ORDER — MEPERIDINE HCL 50 MG/ML IJ SOLN: 6.2500 mg | INTRAMUSCULAR | Status: DC | PRN

## 2021-10-03 MED ORDER — ACETAMINOPHEN 325 MG PO TABS: 325.0000 mg | ORAL_TABLET | ORAL | Status: DC | PRN

## 2021-10-03 MED ORDER — LACTATED RINGERS IV SOLN: INTRAVENOUS | Status: DC

## 2021-10-03 MED ORDER — FENTANYL CITRATE (PF) 100 MCG/2ML IJ SOLN
INTRAMUSCULAR | Status: AC
  Filled 2021-10-03: qty 2

## 2021-10-03 MED ORDER — OXYCODONE HCL 5 MG PO TABS
5.0000 mg | ORAL_TABLET | Freq: Once | ORAL | Status: AC
  Administered 2021-10-03: 5 mg via ORAL
  Filled 2021-10-03: qty 1

## 2021-10-03 MED ORDER — ACETAMINOPHEN 160 MG/5ML PO SOLN: 325.0000 mg | ORAL | Status: DC | PRN

## 2021-10-03 MED ORDER — ONDANSETRON HCL 4 MG/2ML IJ SOLN
INTRAMUSCULAR | Status: DC | PRN
  Administered 2021-10-03: 4 mg via INTRAVENOUS

## 2021-10-03 MED ORDER — OXYCODONE HCL 5 MG PO TABS: 5.0000 mg | ORAL_TABLET | Freq: Once | ORAL | Status: DC | PRN

## 2021-10-03 MED ORDER — PROPOFOL 10 MG/ML IV BOLUS
INTRAVENOUS | Status: DC | PRN
  Administered 2021-10-03: 200 mg via INTRAVENOUS

## 2021-10-03 MED ORDER — CEFAZOLIN SODIUM-DEXTROSE 2-4 GM/100ML-% IV SOLN
2.0000 g | Freq: Once | INTRAVENOUS | Status: AC
Start: 2021-10-03 — End: 2021-10-03
  Administered 2021-10-03: 2 g via INTRAVENOUS
  Filled 2021-10-03: qty 100

## 2021-10-03 MED ORDER — LIDOCAINE 2% (20 MG/ML) 5 ML SYRINGE
INTRAMUSCULAR | Status: DC | PRN
  Administered 2021-10-03: 80 mg via INTRAVENOUS

## 2021-10-03 MED ORDER — SODIUM CHLORIDE 0.9 % IV SOLN
INTRAVENOUS | Status: DC
Start: 2021-10-03 — End: 2021-10-03

## 2021-10-03 MED ORDER — STERILE WATER FOR INJECTION IJ SOLN
INTRAMUSCULAR | Status: AC
  Filled 2021-10-03: qty 10

## 2021-10-03 MED ORDER — MIDAZOLAM HCL 5 MG/5ML IJ SOLN
INTRAMUSCULAR | Status: DC | PRN
  Administered 2021-10-03: 2 mg via INTRAVENOUS

## 2021-10-03 MED ORDER — MIDAZOLAM HCL 2 MG/2ML IJ SOLN
INTRAMUSCULAR | Status: AC
  Filled 2021-10-03: qty 2

## 2021-10-03 MED ORDER — FENTANYL CITRATE PF 50 MCG/ML IJ SOSY: 25.0000 ug | PREFILLED_SYRINGE | INTRAMUSCULAR | Status: DC | PRN

## 2021-10-03 MED ORDER — IOHEXOL 300 MG/ML  SOLN
INTRAMUSCULAR | Status: DC | PRN
  Administered 2021-10-03: 10 mL

## 2021-10-03 MED ORDER — OXYCODONE HCL 5 MG/5ML PO SOLN: 5.0000 mg | Freq: Once | ORAL | Status: DC | PRN

## 2021-10-03 MED ORDER — SODIUM CHLORIDE 0.9 % IR SOLN
Status: DC | PRN
  Administered 2021-10-03: 3000 mL

## 2021-10-03 MED ORDER — OXYCODONE HCL 5 MG PO TABS: 5.0000 mg | ORAL_TABLET | ORAL | 0 refills | Status: DC | PRN

## 2021-10-03 MED ORDER — 0.9 % SODIUM CHLORIDE (POUR BTL) OPTIME
TOPICAL | Status: DC | PRN
  Administered 2021-10-03: 1000 mL

## 2021-10-03 MED ORDER — CEPHALEXIN 500 MG PO CAPS: 500.0000 mg | ORAL_CAPSULE | Freq: Two times a day (BID) | ORAL | 0 refills | Status: AC

## 2021-10-03 MED ORDER — DEXAMETHASONE SODIUM PHOSPHATE 10 MG/ML IJ SOLN
INTRAMUSCULAR | Status: DC | PRN
  Administered 2021-10-03: 5 mg via INTRAVENOUS

## 2021-10-03 MED ORDER — ONDANSETRON 4 MG PO TBDP
4.0000 mg | ORAL_TABLET | Freq: Three times a day (TID) | ORAL | 0 refills | Status: DC | PRN
Start: 2021-10-03 — End: 2022-04-14

## 2021-10-03 MED ORDER — ONDANSETRON HCL 4 MG/2ML IJ SOLN: 4.0000 mg | Freq: Once | INTRAMUSCULAR | Status: DC | PRN

## 2021-10-03 SURGICAL SUPPLY — 19 items
BAG URO CATCHER STRL LF (MISCELLANEOUS) ×2 IMPLANT
BASKET ZERO TIP NITINOL 2.4FR (BASKET) ×1 IMPLANT
BSKT STON RTRVL ZERO TP 2.4FR (BASKET) ×1
CATH URETL OPEN 5X70 (CATHETERS) ×2 IMPLANT
CLOTH BEACON ORANGE TIMEOUT ST (SAFETY) ×2 IMPLANT
EXTRACTOR STONE NITINOL NGAGE (UROLOGICAL SUPPLIES) IMPLANT
GLOVE SURG ENC TEXT LTX SZ7.5 (GLOVE) ×2 IMPLANT
GOWN STRL REUS W/TWL XL LVL3 (GOWN DISPOSABLE) ×3 IMPLANT
GUIDEWIRE STR DUAL SENSOR (WIRE) IMPLANT
GUIDEWIRE ZIPWRE .038 STRAIGHT (WIRE) ×2 IMPLANT
LASER FIB FLEXIVA PULSE ID 365 (Laser) IMPLANT
MANIFOLD NEPTUNE II (INSTRUMENTS) ×2 IMPLANT
PACK CYSTO (CUSTOM PROCEDURE TRAY) ×2 IMPLANT
SHEATH NAVIGATOR HD 11/13X36 (SHEATH) IMPLANT
STENT URET 6FRX24 CONTOUR (STENTS) ×1 IMPLANT
TRACTIP FLEXIVA PULS ID 200XHI (Laser) IMPLANT
TRACTIP FLEXIVA PULSE ID 200 (Laser)
TUBING CONNECTING 10 (TUBING) ×2 IMPLANT
TUBING UROLOGY SET (TUBING) ×2 IMPLANT

## 2021-10-03 NOTE — Op Note (Signed)
Operative Note  Preoperative diagnosis:  1.  2 mm right ureteral stone 2.  Solitary right kidney  Postoperative diagnosis: Same  Procedure(s): 1.  Cystoscopy with right ureteroscopy, basket stone extraction and right ureteral stent placement 2.  Right retrograde pyelogram with intraoperative interpretation of fluoroscopic imaging  Surgeon: Ellison Hughs, MD  Assistants:  None  Anesthesia:  General  Complications:  None  EBL: Less than 5 mL  Specimens: 1.  Right ureteral stone  Drains/Catheters: 1.  Right 6 French, 24 cm JJ stent with tether  Intraoperative findings:   A right ureteral calculus was found to be crowning at the right UVJ that was atraumatically extracted using a stone basket Ureteroscopy revealed no evidence of additional stone burden along the entire length of the right ureter Solitary right collecting system with no filling defects or dilation involving the right ureter or right renal pelvis seen on retrograde pyelogram   Indication:  Carol [name] is a 65 y.o. female with a solitary right kidney and an obstructing 2 mm right mid ureteral calculus associated with renal colic.  She has been consented for the above procedures, voices understanding and wishes to proceed.  Description of procedure:  After informed consent was obtained, the patient was brought to the operating room and general LMA anesthesia was administered. The patient was then placed in the dorsolithotomy position and prepped and draped in the usual sterile fashion. A timeout was performed. A 23 French rigid cystoscope was then inserted into the urethral meatus and advanced into the bladder under direct vision. A complete bladder survey revealed no intravesical pathology.  There was a 2 to 3 mm calculus protruding from the right ureteral orifice.  A Glidewire was advanced beyond the stone and up to the right renal pelvis, fluoroscopic guidance.  A stone basket was then used to gently  extract the stone from the right UVJ.    A 5 French ureteral catheter was then inserted into the right ureteral orifice and a retrograde pyelogram was obtained, with the findings listed above.  A Glidewire was then used to intubate the lumen of the ureteral catheter and was advanced up to the right renal pelvis, under fluoroscopic guidance.  The catheter was then removed, leaving the wire in place.  A semirigid ureteroscope was then advanced into the right ureter and up to the right UPJ, revealing no additional stone burden within the lumen of the right ureter.  The ureteroscope was then removed.  A 6 French, 24 cm JJ stent was then advanced over the wire and into good position within the right collecting system, confirming placement via fluoroscopy.  Plan: Discharge home.  The patient has been instructed to remove her ureteral stent at 7 AM 10/06/2021.

## 2021-10-03 NOTE — Transfer of Care (Signed)
Immediate Anesthesia Transfer of Care Note  Patient: Carol [name]  Procedure(s) Performed: CYSTOSCOPY WITH RETROGRADE PYELOGRAM, URETEROSCOPY,STONE EXTRACTION AND STENT PLACEMENT (Right)  Patient Location: PACU  Anesthesia Type:General  Level of Consciousness: awake, alert  and oriented  Airway & Oxygen Therapy: Patient Spontanous Breathing and Patient connected to face mask oxygen  Post-op Assessment: Report given to RN and Post -op Vital signs reviewed and stable  Post vital signs: Reviewed and stable  Last Vitals:  Vitals Value Taken Time  BP 139/66 10/03/21 1355  Temp    Pulse 83 10/03/21 1356  Resp 12 10/03/21 1356  SpO2 100 % 10/03/21 1356  Vitals shown include unvalidated device data.  Last Pain:  Vitals:   10/03/21 1121  TempSrc: Oral  PainSc:          Complications: No notable events documented.

## 2021-10-03 NOTE — Anesthesia Preprocedure Evaluation (Addendum)
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    Pulmonary sleep apnea and Continuous Positive Airway Pressure Ventilation ,    Pulmonary exam normal breath sounds clear to auscultation       Cardiovascular negative cardio ROS Normal cardiovascular exam Rhythm:Regular Rate:Normal     Neuro/Psych negative neurological ROS  negative psych ROS   GI/Hepatic negative GI ROS, Neg liver ROS,   Endo/Other  diabetes, Type 2, Oral Hypoglycemic AgentsHypothyroidism Morbid obesity  Renal/GU Renal disease  negative genitourinary   Musculoskeletal  (+) Arthritis ,   Abdominal   Peds negative pediatric ROS (+)  Hematology  (+) Blood dyscrasia, anemia ,   Anesthesia Other Findings   Reproductive/Obstetrics negative OB ROS                            Anesthesia Physical  Anesthesia Plan  ASA: 3  Anesthesia Plan: General   Post-op Pain Management:    Induction: Intravenous  PONV Risk Score and Plan: 3 and Ondansetron, Dexamethasone and Treatment may vary due to age or medical condition  Airway Management Planned: LMA  Additional Equipment: None  Intra-op Plan:   Post-operative Plan: Extubation in OR  Informed Consent: I have reviewed the patients History and Physical, chart, labs and discussed the procedure including the risks, benefits and alternatives for the proposed anesthesia with the patient or authorized representative who has indicated his/her understanding and acceptance.     Dental advisory given  Plan Discussed with: CRNA and Surgeon  Anesthesia Plan Comments:        Anesthesia Quick  Evaluation

## 2021-10-03 NOTE — H&P (Signed)
H&P  Chief Complaint: Right flank pain   History of Present Illness: Carol [name] is a 65 y.o. year old female with a 24-hour history of intermittent episodes of sharp right-sided flank pain associated with nausea.  CT stone study performed this morning revealed a 2 to 3 mm right mid ureteral calculus with mild to moderate right-sided hydronephrosis.  No appreciable AKI on BMP.    She is currently being followed by Dr. 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  She subsequently developed a ureteral stricture secondary to an obstructing ureteral stone but ultimately required a radical nephrectomy in 2020 due to the left kidney functioning inadequately.   Past Medical History:  Diagnosis Date   Allergy    Ankle edema    both ankles   Arthritis    hands,knees,elbows   Cancer ([place]) 2016   tumor kidney   Diabetes mellitus    type 2    Eczema    History of kidney stones    Hypothyroidism    Obesity    Sleep apnea    cpap machine setting of 3   Thyroid disease     Past Surgical History:  Procedure Laterality Date   BREAST BIOPSY Right 2001   CHOLECYSTECTOMY  2009   open   COLONOSCOPY     CYSTOSCOPY W/ RETROGRADES Left 11/22/2018   Procedure: CYSTOSCOPY WITH RETROGRADE PYELOGRAM;  Surgeon: Ardis Hughs, MD;  Location: [facility];  Service: Urology;  Laterality: Left;   DILATATION & CURRETTAGE/HYSTEROSCOPY WITH RESECTOCOPE N/A 01/21/2013   Procedure: DILATATION & CURETTAGE/HYSTEROSCOPY WITH RESECTOCOPE AND RESECTION OF ENDOMETRIAL;  Surgeon: Selinda Orion, MD;  Location: [facility];  Service: Gynecology;  Laterality: N/A;   KNEE ARTHROSCOPY     left    POLYPECTOMY     POSTERIOR CERVICAL FUSION/FORAMINOTOMY N/A 08/25/2021   Procedure: Occiput to Cervical 5 Posterior cervical instrumented fusion with open biopsy of Cervical 2 Mass. Cervical Two Ganglionectomy;  Surgeon: Judith Part, MD;   Location: [facility];  Service: Neurosurgery;  Laterality: N/A;   ROBOT ASSISTED PYELOPLASTY Left 11/22/2018   Procedure: XI ROBOTIC ASSISTED ATTEMPTED PYELOPLASTY CONVERTED TO NEPHRECTOMY/LYSIS OF ADHESIONS/UMBILICAL HERNIA REPAIR;  Surgeon: Ardis Hughs, MD;  Location: [facility];  Service: Urology;  Laterality: Left;   ROBOTIC ASSITED PARTIAL NEPHRECTOMY Left 06/11/2015   Procedure: LEFT ROBOTIC ASSISTED LAPAROSCOPIC  PARTIAL NEPHRECTOMY;  Surgeon: Ardis Hughs, MD;  Location: [facility];  Service: Urology;  Laterality: Left;   ROTATOR CUFF REPAIR Left 2011   URETERAL BIOPSY Left 02/14/2017   Procedure: URETERAL BIOPSY;  Surgeon: Ardis Hughs, MD;  Location: [facility];  Service: Urology;  Laterality: Left;   URETEROSCOPY WITH HOLMIUM LASER LITHOTRIPSY Left 02/14/2017   Procedure: URETEROSCOPY LITHOTRIPSY, URETERAL BALLOON DILATION;  Surgeon: Ardis Hughs, MD;  Location: [facility];  Service: Urology;  Laterality: Left;  With STENT    Home Medications:  Current Meds  Medication Sig   acetaminophen (TYLENOL) 500 MG tablet Take 1,000 mg by mouth every 6 (six) hours as needed for mild pain.   Ascorbic Acid (VITAMIN C ADULT GUMMIES PO) Take 1 tablet by mouth daily.   atorvastatin (LIPITOR) 20 MG tablet Take 20 mg by mouth at bedtime.   Cholecalciferol (VITAMIN D3) 25 MCG (1000 UT) CAPS Take 1,000 Units by mouth daily.   cyclobenzaprine (FLEXERIL) 10 MG tablet Take 10 mg by mouth 3 (three) times daily  as needed for muscle spasms.   furosemide (LASIX) 20 MG tablet Take 20 mg by mouth daily as needed for fluid or edema.   levothyroxine (SYNTHROID) 75 MCG tablet Take 75 mcg by mouth daily before breakfast.   metFORMIN (GLUCOPHAGE) 1000 MG tablet Take 1,000 mg by mouth daily with breakfast.   OZEMPIC, 0.25 OR 0.5 MG/DOSE, 2 MG/1.5ML SOPN Inject 0.5 mg into the skin every Saturday.    Allergies:  Allergies  Allergen Reactions   Morphine Nausea And Vomiting    Family History  Problem Relation  Age of Onset   Cancer Father     Social History:  reports that she has never smoked. She has never used smokeless tobacco. She reports current alcohol use. She reports that she does not use drugs.  ROS: A complete review of systems was performed.  All systems are negative except for pertinent findings as noted.  Physical Exam:  Vital signs in last 24 hours: Temp:  [98 F (36.7 C)-98.6 F (37 C)] 98.6 F (37 C) (11/14 1121) Pulse Rate:  [65-120] 65 (11/14 1121) Resp:  [15-18] 16 (11/14 1121) BP: (110-141)/(61-82) 124/67 (11/14 1121) SpO2:  [97 %-100 %] 100 % (11/14 1121) Constitutional:  Alert and oriented, No acute distress Cardiovascular: Regular rate and rhythm, No JVD Respiratory: Normal respiratory effort, Lungs clear bilaterally GI: Abdomen is soft, nontender, nondistended, no abdominal masses GU: RIGHT CVA tenderness,  Lymphatic: No lymphadenopathy Neurologic: Grossly intact, no focal deficits Psychiatric: Normal mood and affect   Laboratory Data:  Recent Labs    10/03/21 0006  WBC 8.1  HGB 12.4  HCT 36.7  PLT 217    Recent Labs    10/03/21 0006  NA 137  K 3.8  CL 101  GLUCOSE 202*  BUN 16  CALCIUM 9.6  CREATININE 1.16*     Results for orders placed or performed during the hospital encounter of 10/02/21 (from the past 24 hour(s))  Urinalysis, Routine w reflex microscopic Urine, Clean Catch     Status: Abnormal   Collection Time: 10/03/21 12:02 AM  Result Value Ref Range   Color, Urine RED (A) YELLOW   APPearance TURBID (A) CLEAR   Specific Gravity, Urine 1.025 1.005 - 1.030   pH 7.0 5.0 - 8.0   Glucose, UA (A) NEGATIVE mg/dL    TEST NOT REPORTED DUE TO COLOR INTERFERENCE OF URINE PIGMENT   Hgb urine dipstick (A) NEGATIVE    TEST NOT REPORTED DUE TO COLOR INTERFERENCE OF URINE PIGMENT   Bilirubin Urine (A) NEGATIVE    TEST NOT REPORTED DUE TO COLOR INTERFERENCE OF URINE PIGMENT   Ketones, ur (A) NEGATIVE mg/dL    TEST NOT REPORTED DUE TO COLOR  INTERFERENCE OF URINE PIGMENT   Protein, ur (A) NEGATIVE mg/dL    TEST NOT REPORTED DUE TO COLOR INTERFERENCE OF URINE PIGMENT   Nitrite (A) NEGATIVE    TEST NOT REPORTED DUE TO COLOR INTERFERENCE OF URINE PIGMENT   Leukocytes,Ua (A) NEGATIVE    TEST NOT REPORTED DUE TO COLOR INTERFERENCE OF URINE PIGMENT  Urinalysis, Microscopic (reflex)     Status: Abnormal   Collection Time: 10/03/21 12:02 AM  Result Value Ref Range   RBC / HPF >50 0 - 5 RBC/hpf   WBC, UA 0-5 0 - 5 WBC/hpf   Bacteria, UA MANY (A) NONE SEEN   Squamous Epithelial / LPF 6-10 0 - 5  CBC with Differential     Status: None   Collection Time: 10/03/21 12:06  AM  Result Value Ref Range   WBC 8.1 4.0 - 10.5 K/uL   RBC 4.05 3.87 - 5.11 MIL/uL   Hemoglobin 12.4 12.0 - 15.0 g/dL   HCT 36.7 36.0 - 46.0 %   MCV 90.6 80.0 - 100.0 fL   MCH 30.6 26.0 - 34.0 pg   MCHC 33.8 30.0 - 36.0 g/dL   RDW 13.9 11.5 - 15.5 %   Platelets 217 150 - 400 K/uL   nRBC 0.0 0.0 - 0.2 %   Neutrophils Relative % 71 %   Neutro Abs 5.8 1.7 - 7.7 K/uL   Lymphocytes Relative 18 %   Lymphs Abs 1.4 0.7 - 4.0 K/uL   Monocytes Relative 8 %   Monocytes Absolute 0.7 0.1 - 1.0 K/uL   Eosinophils Relative 1 %   Eosinophils Absolute 0.1 0.0 - 0.5 K/uL   Basophils Relative 1 %   Basophils Absolute 0.0 0.0 - 0.1 K/uL   Immature Granulocytes 1 %   Abs Immature Granulocytes 0.04 0.00 - 0.07 K/uL  Comprehensive metabolic panel     Status: Abnormal   Collection Time: 10/03/21 12:06 AM  Result Value Ref Range   Sodium 137 135 - 145 mmol/L   Potassium 3.8 3.5 - 5.1 mmol/L   Chloride 101 98 - 111 mmol/L   CO2 26 22 - 32 mmol/L   Glucose, Bld 202 (H) 70 - 99 mg/dL   BUN 16 8 - 23 mg/dL   Creatinine, Ser 1.16 (H) 0.44 - 1.00 mg/dL   Calcium 9.6 8.9 - 10.3 mg/dL   Total Protein 7.1 6.5 - 8.1 g/dL   Albumin 4.2 3.5 - 5.0 g/dL   AST 16 15 - 41 U/L   ALT 17 0 - 44 U/L   Alkaline Phosphatase 83 38 - 126 U/L   Total Bilirubin 1.1 0.3 - 1.2 mg/dL   GFR,  Estimated 52 (L) >60 mL/min   Anion gap 10 5 - 15  Resp Panel by RT-PCR (Flu A&B, Covid) Nasopharyngeal Swab     Status: None   Collection Time: 10/03/21  9:20 AM   Specimen: Nasopharyngeal Swab; Nasopharyngeal(NP) swabs in vial transport medium  Result Value Ref Range   SARS Coronavirus 2 by RT PCR NEGATIVE NEGATIVE   Influenza A by PCR NEGATIVE NEGATIVE   Influenza B by PCR NEGATIVE NEGATIVE  Glucose, capillary     Status: Abnormal   Collection Time: 10/03/21 11:43 AM  Result Value Ref Range   Glucose-Capillary 115 (H) 70 - 99 mg/dL   Recent Results (from the past 240 hour(s))  Resp Panel by RT-PCR (Flu A&B, Covid) Nasopharyngeal Swab     Status: None   Collection Time: 10/03/21  9:20 AM   Specimen: Nasopharyngeal Swab; Nasopharyngeal(NP) swabs in vial transport medium  Result Value Ref Range Status   SARS Coronavirus 2 by RT PCR NEGATIVE NEGATIVE Final    Comment: (NOTE) SARS-CoV-2 target nucleic acids are NOT DETECTED.  The SARS-CoV-2 RNA is generally detectable in upper respiratory specimens during the acute phase of infection. The lowest concentration of SARS-CoV-2 viral copies this assay can detect is 138 copies/mL. A negative result does not preclude SARS-Cov-2 infection and should not be used as the sole basis for treatment or other patient management decisions. A negative result may occur with  improper specimen collection/handling, submission of specimen other than nasopharyngeal swab, presence of viral mutation(s) within the areas targeted by this assay, and inadequate number of viral copies(<138 copies/mL). A negative result must be  combined with clinical observations, patient history, and epidemiological information. The expected result is Negative.  Fact Sheet for Patients:  EntrepreneurPulse.com.au  Fact Sheet for Healthcare Providers:  IncredibleEmployment.be  This test is no t yet approved or cleared by the [place]  FDA and  has been authorized for detection and/or diagnosis of SARS-CoV-2 by FDA under an Emergency Use Authorization (EUA). This EUA will remain  in effect (meaning this test can be used) for the duration of the COVID-19 declaration under Section 564(b)(1) of the Act, 21 U.S.C.section 360bbb-3(b)(1), unless the authorization is terminated  or revoked sooner.       Influenza A by PCR NEGATIVE NEGATIVE Final   Influenza B by PCR NEGATIVE NEGATIVE Final    Comment: (NOTE) The Xpert Xpress SARS-CoV-2/FLU/RSV plus assay is intended as an aid in the diagnosis of influenza from Nasopharyngeal swab specimens and should not be used as a sole basis for treatment. Nasal washings and aspirates are unacceptable for Xpert Xpress SARS-CoV-2/FLU/RSV testing.  Fact Sheet for Patients: EntrepreneurPulse.com.au  Fact Sheet for Healthcare Providers: IncredibleEmployment.be  This test is not yet approved or cleared by the [place] FDA and has been authorized for detection and/or diagnosis of SARS-CoV-2 by FDA under an Emergency Use Authorization (EUA). This EUA will remain in effect (meaning this test can be used) for the duration of the COVID-19 declaration under Section 564(b)(1) of the Act, 21 U.S.C. section 360bbb-3(b)(1), unless the authorization is terminated or revoked.  Performed at [facility], [place] [address] [place], [place] [address]     Renal Function: Recent Labs    10/03/21 0006  CREATININE 1.16*   CrCl cannot be calculated (Unknown ideal weight.).  Radiologic Imaging: CT Renal Stone Study  Result Date: 10/03/2021 CLINICAL DATA:  Flank pain, kidney stone suspected. EXAM: CT ABDOMEN AND PELVIS WITHOUT CONTRAST TECHNIQUE: Multidetector CT imaging of the abdomen and pelvis was performed following the standard protocol without IV contrast. COMPARISON:  09/16/2019. FINDINGS: Lower chest: No acute abnormality.  Hepatobiliary: No focal liver abnormality is seen. Status post cholecystectomy. No biliary dilatation. Pancreas: Unremarkable. No pancreatic ductal dilatation or surrounding inflammatory changes. Spleen: Normal in size without focal abnormality. Adrenals/Urinary Tract: No adrenal nodule or mass. The left kidney is surgically absent and an irregular fat containing lesion is noted in the surgical bed measuring 2.8 x 1.5 cm, not significantly changed from the prior exam, possible fat necrosis or scarring. There is no renal calculus on the right. There is mild hydroureteronephrosis with a 2 mm calculus in the mid right ureter, axial image 51. The urinary bladder is unremarkable. Stomach/Bowel: No bowel obstruction, free air or pneumatosis. Scattered diverticular present along the colon without evidence of diverticulitis. The appendix is unremarkable. No focal bowel wall thickening. Vascular/Lymphatic: Aortic atherosclerosis. No enlarged abdominal or pelvic lymph nodes. Reproductive: Uterus and bilateral adnexa are unremarkable. Other: No free fluid. Musculoskeletal: Osteopenia is noted. There are degenerative changes in the thoracolumbar spine. Questionable subtle tiny lytic lesions in the lumbar spine. No acute osseous abnormality is identified. IMPRESSION: 1. Mild to moderate obstructive uropathy on the right with a 2 mm calculus in the mid right ureter. 2. Status post left nephrectomy with fat necrosis/scarring in the surgical bed. 3. Questionable tiny lytic lesions in the lumbar spine, may be associated with osteopenia versus metastatic disease. Attention on follow-up is recommended. Electronically Signed   By: Brett Fairy M.D.   On: 10/03/2021 01:52    Assessment:  65 year old female with an obstructing  2 to 3 mm right mid ureteral calculus.  History of solitary right kidney.  Plan:  The risks, benefits and alternatives of cystoscopy with RIGHT ureteroscopy, laser lithotripsy and ureteral stent placement  was discussed the patient.  Risks included, but are not limited to: bleeding, urinary tract infection, ureteral injury/avulsion, ureteral stricture formation, retained stone fragments, the possibility that multiple surgeries may be required to treat the stone(s), MI, stroke, PE and the inherent risks of general anesthesia.  The patient voices understanding and wishes to proceed.      Ellison Hughs, MD 10/03/2021, 12:11 PM  [facility] Pager: [phone]

## 2021-10-03 NOTE — ED Notes (Signed)
Pt Has been a able to urinate twice in the last hour.

## 2021-10-03 NOTE — Anesthesia Postprocedure Evaluation (Signed)
Anesthesia Post Note  Patient: Carol [name]  Procedure(s) Performed: CYSTOSCOPY WITH RETROGRADE PYELOGRAM, URETEROSCOPY,STONE EXTRACTION AND STENT PLACEMENT (Right)     Patient location during evaluation: PACU Anesthesia Type: General Level of consciousness: awake and alert Pain management: pain level controlled Vital Signs Assessment: post-procedure vital signs reviewed and stable Respiratory status: spontaneous breathing, nonlabored ventilation, respiratory function stable and patient connected to nasal cannula oxygen Cardiovascular status: blood pressure returned to baseline and stable Postop Assessment: no apparent nausea or vomiting Anesthetic complications: no   No notable events documented.  Last Vitals:  Vitals:   10/03/21 1431 10/03/21 1443  BP: 139/65   Pulse: 81 80  Resp: 18 16  Temp:    SpO2: 100% 100%    Last Pain:  Vitals:   10/03/21 1443  TempSrc:   PainSc: 0-No pain                 Taiwan Talcott

## 2021-10-03 NOTE — ED Provider Notes (Addendum)
[facility] DEPT Provider Note   CSN: [id number] Arrival date & time: 10/02/21  2249     History Chief Complaint  Patient presents with   Flank Pain    Carol [name] is a 65 y.o. female.  Right flank pain on and off since last night. Hx of kidney stones. Currently posisble new cancer process, lesion in spine.   The history is provided by the patient.  Flank Pain This is a new problem. The current episode started yesterday. Episode frequency: intermittnt. The problem has been resolved. Pertinent negatives include no chest pain, no abdominal pain, no headaches and no shortness of breath. Nothing aggravates the symptoms. Nothing relieves the symptoms. She has tried nothing for the symptoms. The treatment provided no relief.      Past Medical History:  Diagnosis Date   Allergy    Ankle edema    both ankles   Arthritis    hands,knees,elbows   Cancer ([place]) 2016   tumor kidney   Diabetes mellitus    type 2    Eczema    History of kidney stones    Hypothyroidism    Obesity    Sleep apnea    cpap machine setting of 3   Thyroid disease     Patient Active Problem List   Diagnosis Date Noted   Spinal stenosis in cervical region 08/25/2021   Closed cervical spine fracture ([place]) 08/25/2021   Multiple myeloma without remission ([place]) 08/16/2021   UPJ (ureteropelvic junction) obstruction 11/22/2018   OSA on CPAP 05/03/2016   Morbid obesity ([place]) 05/03/2016   Hyperlipidemia 05/03/2016   Diabetes ([place]) 06/13/2015   Postoperative anemia due to acute blood loss 06/13/2015   Renal neoplasm 06/11/2015   NONSPECIFIC ABN FINDING RAD & OTH EXAM GI TRACT 10/08/2009    Past Surgical History:  Procedure Laterality Date   BREAST BIOPSY Right 2001   CHOLECYSTECTOMY  2009   open   COLONOSCOPY     CYSTOSCOPY W/ RETROGRADES Left 11/22/2018   Procedure: CYSTOSCOPY WITH RETROGRADE PYELOGRAM;  Surgeon: Ardis Hughs, MD;  Location: [facility];  Service:  Urology;  Laterality: Left;   DILATATION & CURRETTAGE/HYSTEROSCOPY WITH RESECTOCOPE N/A 01/21/2013   Procedure: DILATATION & CURETTAGE/HYSTEROSCOPY WITH RESECTOCOPE AND RESECTION OF ENDOMETRIAL;  Surgeon: Selinda Orion, MD;  Location: [facility];  Service: Gynecology;  Laterality: N/A;   KNEE ARTHROSCOPY     left    POLYPECTOMY     POSTERIOR CERVICAL FUSION/FORAMINOTOMY N/A 08/25/2021   Procedure: Occiput to Cervical 5 Posterior cervical instrumented fusion with open biopsy of Cervical 2 Mass. Cervical Two Ganglionectomy;  Surgeon: Judith Part, MD;  Location: [facility];  Service: Neurosurgery;  Laterality: N/A;   ROBOT ASSISTED PYELOPLASTY Left 11/22/2018   Procedure: XI ROBOTIC ASSISTED ATTEMPTED PYELOPLASTY CONVERTED TO NEPHRECTOMY/LYSIS OF ADHESIONS/UMBILICAL HERNIA REPAIR;  Surgeon: Ardis Hughs, MD;  Location: [facility];  Service: Urology;  Laterality: Left;   ROBOTIC ASSITED PARTIAL NEPHRECTOMY Left 06/11/2015   Procedure: LEFT ROBOTIC ASSISTED LAPAROSCOPIC  PARTIAL NEPHRECTOMY;  Surgeon: Ardis Hughs, MD;  Location: [facility];  Service: Urology;  Laterality: Left;   ROTATOR CUFF REPAIR Left 2011   URETERAL BIOPSY Left 02/14/2017   Procedure: URETERAL BIOPSY;  Surgeon: Ardis Hughs, MD;  Location: [facility];  Service: Urology;  Laterality: Left;   URETEROSCOPY WITH HOLMIUM LASER LITHOTRIPSY Left 02/14/2017   Procedure: URETEROSCOPY LITHOTRIPSY, URETERAL BALLOON DILATION;  Surgeon: Ardis Hughs, MD;  Location: [facility];  Service: Urology;  Laterality: Left;  With STENT     OB History   No obstetric history on file.     Family History  Problem Relation Age of Onset   Cancer Father     Social History   Tobacco Use   Smoking status: Never   Smokeless tobacco: Never  Vaping Use   Vaping Use: Never used  Substance Use Topics   Alcohol use: Yes    Comment: occ   Drug use: No    Home Medications Prior to Admission medications   Medication Sig  Start Date End Date Taking? Authorizing Provider  acetaminophen (TYLENOL) 500 MG tablet Take 1,000 mg by mouth every 6 (six) hours as needed for mild pain.   Yes [provider]  Ascorbic Acid (VITAMIN C ADULT GUMMIES PO) Take 1 tablet by mouth daily.   Yes [provider]  atorvastatin (LIPITOR) 20 MG tablet Take 20 mg by mouth at bedtime. 08/06/15  Yes [provider]  Cholecalciferol (VITAMIN D3) 25 MCG (1000 UT) CAPS Take 1,000 Units by mouth daily.   Yes [provider]  cyclobenzaprine (FLEXERIL) 10 MG tablet Take 10 mg by mouth 3 (three) times daily as needed for muscle spasms. 09/24/21  Yes [provider]  furosemide (LASIX) 20 MG tablet Take 20 mg by mouth daily as needed for fluid or edema.   Yes [provider]  levothyroxine (SYNTHROID) 75 MCG tablet Take 75 mcg by mouth daily before breakfast. 04/14/21  Yes [provider]  metFORMIN (GLUCOPHAGE) 1000 MG tablet Take 1,000 mg by mouth daily with breakfast.   Yes [provider]  OZEMPIC, 0.25 OR 0.5 MG/DOSE, 2 MG/1.5ML SOPN Inject 0.5 mg into the skin every Saturday. 07/19/21  Yes [provider]  ondansetron (ZOFRAN ODT) 4 MG disintegrating tablet Take 1 tablet (4 mg total) by mouth every 8 (eight) hours as needed for nausea or vomiting. Patient not taking: No sig reported 07/29/21   Fredia Sorrow, MD  oxyCODONE (OXY IR/ROXICODONE) 5 MG immediate release tablet Take 1 tablet (5 mg total) by mouth every 4 (four) hours as needed for moderate pain ((score 4 to 6)). Patient not taking: No sig reported 08/28/21   Dawley, Troy C, DO  PROAIR HFA 108 (90 BASE) MCG/ACT inhaler Inhale 2 puffs into the lungs every 4 (four) hours as needed for wheezing or shortness of breath. Uses seasonal Patient not taking: No sig reported 02/12/15   [provider]    Allergies    Morphine  Review of Systems   Review of Systems  Constitutional:  Negative for chills and  fever.  HENT:  Negative for ear pain and sore throat.   Eyes:  Negative for pain and visual disturbance.  Respiratory:  Negative for cough and shortness of breath.   Cardiovascular:  Negative for chest pain and palpitations.  Gastrointestinal:  Negative for abdominal pain and vomiting.  Genitourinary:  Positive for flank pain. Negative for dysuria and hematuria.  Musculoskeletal:  Negative for arthralgias and back pain.  Skin:  Negative for color change and rash.  Neurological:  Negative for seizures, syncope and headaches.  All other systems reviewed and are negative.  Physical Exam Updated Vital Signs BP 121/67   Pulse 79   Temp 98 F (36.7 C) (Oral)   Resp 18   SpO2 97%   Physical Exam Vitals and nursing note reviewed.  Constitutional:      General: She is not in acute distress.  Appearance: She is well-developed.  HENT:     Head: Normocephalic and atraumatic.     Nose: Nose normal.     Mouth/Throat:     Mouth: Mucous membranes are moist.  Eyes:     Extraocular Movements: Extraocular movements intact.     Conjunctiva/sclera: Conjunctivae normal.     Pupils: Pupils are equal, round, and reactive to light.  Cardiovascular:     Rate and Rhythm: Normal rate and regular rhythm.     Pulses: Normal pulses.     Heart sounds: Normal heart sounds. No murmur heard. Pulmonary:     Effort: Pulmonary effort is normal. No respiratory distress.     Breath sounds: Normal breath sounds.  Abdominal:     Palpations: Abdomen is soft.     Tenderness: There is no abdominal tenderness.  Musculoskeletal:     Cervical back: Neck supple.  Skin:    General: Skin is warm and dry.     Capillary Refill: Capillary refill takes less than 2 seconds.  Neurological:     General: No focal deficit present.     Mental Status: She is alert.  Psychiatric:        Mood and Affect: Mood normal.    ED Results / Procedures / Treatments   Labs (all labs ordered are listed, but only abnormal results  are displayed) Labs Reviewed  URINALYSIS, ROUTINE W REFLEX MICROSCOPIC - Abnormal; Notable for the following components:      Result Value   Color, Urine RED (*)    APPearance TURBID (*)    Glucose, UA   (*)    Value: TEST NOT REPORTED DUE TO COLOR INTERFERENCE OF URINE PIGMENT   Hgb urine dipstick   (*)    Value: TEST NOT REPORTED DUE TO COLOR INTERFERENCE OF URINE PIGMENT   Bilirubin Urine   (*)    Value: TEST NOT REPORTED DUE TO COLOR INTERFERENCE OF URINE PIGMENT   Ketones, ur   (*)    Value: TEST NOT REPORTED DUE TO COLOR INTERFERENCE OF URINE PIGMENT   Protein, ur   (*)    Value: TEST NOT REPORTED DUE TO COLOR INTERFERENCE OF URINE PIGMENT   Nitrite   (*)    Value: TEST NOT REPORTED DUE TO COLOR INTERFERENCE OF URINE PIGMENT   Leukocytes,Ua   (*)    Value: TEST NOT REPORTED DUE TO COLOR INTERFERENCE OF URINE PIGMENT   All other components within normal limits  COMPREHENSIVE METABOLIC PANEL - Abnormal; Notable for the following components:   Glucose, Bld 202 (*)    Creatinine, Ser 1.16 (*)    GFR, Estimated 52 (*)    All other components within normal limits  URINALYSIS, MICROSCOPIC (REFLEX) - Abnormal; Notable for the following components:   Bacteria, UA MANY (*)    All other components within normal limits  RESP PANEL BY RT-PCR (FLU A&B, COVID) ARPGX2  CBC WITH DIFFERENTIAL/PLATELET    EKG None  Radiology CT Renal Stone Study  Result Date: 10/03/2021 CLINICAL DATA:  Flank pain, kidney stone suspected. EXAM: CT ABDOMEN AND PELVIS WITHOUT CONTRAST TECHNIQUE: Multidetector CT imaging of the abdomen and pelvis was performed following the standard protocol without IV contrast. COMPARISON:  09/16/2019. FINDINGS: Lower chest: No acute abnormality. Hepatobiliary: No focal liver abnormality is seen. Status post cholecystectomy. No biliary dilatation. Pancreas: Unremarkable. No pancreatic ductal dilatation or surrounding inflammatory changes. Spleen: Normal in size without focal  abnormality. Adrenals/Urinary Tract: No adrenal nodule or mass. The left kidney is  surgically absent and an irregular fat containing lesion is noted in the surgical bed measuring 2.8 x 1.5 cm, not significantly changed from the prior exam, possible fat necrosis or scarring. There is no renal calculus on the right. There is mild hydroureteronephrosis with a 2 mm calculus in the mid right ureter, axial image 51. The urinary bladder is unremarkable. Stomach/Bowel: No bowel obstruction, free air or pneumatosis. Scattered diverticular present along the colon without evidence of diverticulitis. The appendix is unremarkable. No focal bowel wall thickening. Vascular/Lymphatic: Aortic atherosclerosis. No enlarged abdominal or pelvic lymph nodes. Reproductive: Uterus and bilateral adnexa are unremarkable. Other: No free fluid. Musculoskeletal: Osteopenia is noted. There are degenerative changes in the thoracolumbar spine. Questionable subtle tiny lytic lesions in the lumbar spine. No acute osseous abnormality is identified. IMPRESSION: 1. Mild to moderate obstructive uropathy on the right with a 2 mm calculus in the mid right ureter. 2. Status post left nephrectomy with fat necrosis/scarring in the surgical bed. 3. Questionable tiny lytic lesions in the lumbar spine, may be associated with osteopenia versus metastatic disease. Attention on follow-up is recommended. Electronically Signed   By: Brett Fairy M.D.   On: 10/03/2021 01:52    Procedures Procedures   Medications Ordered in ED Medications  0.9 %  sodium chloride infusion (has no administration in time range)  ceFAZolin (ANCEF) IVPB 2g/100 mL premix (has no administration in time range)  oxyCODONE (Oxy IR/ROXICODONE) immediate release tablet 5 mg (5 mg Oral Given 10/03/21 0816)    ED Course  I have reviewed the triage vital signs and the nursing notes.  Pertinent labs & imaging results that were available during my care of the patient were reviewed by  me and considered in my medical decision making (see chart for details).    MDM Rules/Calculators/A&P                           FELMA PFEFFERLE is here with right flank pain.  Patient with history of renal cell carcinoma status post left nephrectomy.  Currently undergoing treatment for lesion in her lumbar spine.  Follow with oncology.  Not sure what cancer processes at this time but is set to go for bone marrow evaluation soon.  Lab work and imaging done prior to my evaluation reveals a 2 mm calculus in the mid right ureter.  Urinalysis overall difficult to evaluate.  She has no fever.  No leukocytosis.  Talked with Dr. Lovena Neighbours with urology given solitary kidney and obstructive right-sided kidney stone.  He will take to the OR for stent.  Patient otherwise is comfortable at this time.  She has no pain, no nausea.  We will go to the OR with urology.  This chart was dictated using voice recognition software.  Despite best efforts to proofread,  errors can occur which can change the documentation meaning.   Final Clinical Impression(s) / ED Diagnoses Final diagnoses:  Kidney stone    Rx / DC Orders ED Discharge Orders     None        Lennice Sites, DO 10/03/21 Garretson, [place], DO 10/03/21 [phone]

## 2021-10-03 NOTE — Anesthesia Procedure Notes (Signed)
Procedure Name: LMA Insertion Date/Time: 10/03/2021 1:30 PM Performed by: Gean Maidens, CRNA Pre-anesthesia Checklist: Patient identified, Emergency Drugs available, Suction available, Patient being monitored and Timeout performed Patient Re-evaluated:Patient Re-evaluated prior to induction Oxygen Delivery Method: Circle system utilized Preoxygenation: Pre-oxygenation with 100% oxygen Induction Type: IV induction Ventilation: Mask ventilation without difficulty LMA: LMA inserted LMA Size: 4.0 Number of attempts: 1 Placement Confirmation: positive ETCO2 and breath sounds checked- equal and bilateral Tube secured with: Tape Dental Injury: Teeth and Oropharynx as per pre-operative assessment

## 2021-10-03 NOTE — ED Triage Notes (Signed)
Pt complains of right kidney pain since earlier this morning. Pt states that she is unable to urinate as she normally does.

## 2021-10-03 NOTE — ED Notes (Signed)
Pt provided with toothbrush, toothpaste, CHG wipes. Pt educated on how to properly wipe down body before surgery.

## 2021-10-04 ENCOUNTER — Encounter (HOSPITAL_COMMUNITY): Payer: Self-pay | Admitting: Urology

## 2021-10-05 LAB — URINALYSIS, ROUTINE W REFLEX MICROSCOPIC

## 2021-10-07 ENCOUNTER — Other Ambulatory Visit (HOSPITAL_COMMUNITY): Payer: Self-pay | Admitting: Physician Assistant

## 2021-10-09 ENCOUNTER — Other Ambulatory Visit: Payer: Self-pay | Admitting: Radiology

## 2021-10-10 ENCOUNTER — Ambulatory Visit (HOSPITAL_COMMUNITY)
Admission: RE | Admit: 2021-10-10 | Discharge: 2021-10-10 | Disposition: A | Discharge status: Home / Self Care | Payer: BC Managed Care – PPO | Source: Ambulatory Visit | Attending: Hematology and Oncology | Admitting: Hematology and Oncology

## 2021-10-10 ENCOUNTER — Encounter (HOSPITAL_COMMUNITY): Payer: Self-pay

## 2021-10-10 ENCOUNTER — Other Ambulatory Visit: Payer: Self-pay

## 2021-10-10 DIAGNOSIS — C9000 Multiple myeloma not having achieved remission: Secondary | ICD-10-CM

## 2021-10-10 LAB — CBC WITH DIFFERENTIAL/PLATELET
Abs Immature Granulocytes: 0.02 10*3/uL (ref 0.00–0.07)
Basophils Absolute: 0 10*3/uL (ref 0.0–0.1)
Basophils Relative: 0 %
Eosinophils Absolute: 0.1 10*3/uL (ref 0.0–0.5)
Eosinophils Relative: 2 %
HCT: 39.6 % (ref 36.0–46.0)
Hemoglobin: 13.6 g/dL (ref 12.0–15.0)
Immature Granulocytes: 0 %
Lymphocytes Relative: 29 %
Lymphs Abs: 1.4 10*3/uL (ref 0.7–4.0)
MCH: 30.8 pg (ref 26.0–34.0)
MCHC: 34.3 g/dL (ref 30.0–36.0)
MCV: 89.6 fL (ref 80.0–100.0)
Monocytes Absolute: 0.4 10*3/uL (ref 0.1–1.0)
Monocytes Relative: 9 %
Neutro Abs: 2.8 10*3/uL (ref 1.7–7.7)
Neutrophils Relative %: 60 %
Platelets: 271 10*3/uL (ref 150–400)
RBC: 4.42 MIL/uL (ref 3.87–5.11)
RDW: 13.2 % (ref 11.5–15.5)
WBC: 4.7 10*3/uL (ref 4.0–10.5)
nRBC: 0 % (ref 0.0–0.2)

## 2021-10-10 LAB — PROTIME-INR
INR: 1 (ref 0.8–1.2)
Prothrombin Time: 13 seconds (ref 11.4–15.2)

## 2021-10-10 LAB — GLUCOSE, CAPILLARY: Glucose-Capillary: 137 mg/dL — ABNORMAL HIGH (ref 70–99)

## 2021-10-10 MED ORDER — MIDAZOLAM HCL 2 MG/2ML IJ SOLN
INTRAMUSCULAR | Status: AC
  Filled 2021-10-10: qty 4

## 2021-10-10 MED ORDER — SODIUM CHLORIDE 0.9 % IV SOLN: INTRAVENOUS | Status: DC

## 2021-10-10 MED ORDER — MIDAZOLAM HCL 2 MG/2ML IJ SOLN
INTRAMUSCULAR | Status: AC | PRN
  Administered 2021-10-10 (×3): 1 mg via INTRAVENOUS

## 2021-10-10 MED ORDER — FENTANYL CITRATE (PF) 100 MCG/2ML IJ SOLN
INTRAMUSCULAR | Status: AC | PRN
  Administered 2021-10-10 (×2): 50 ug via INTRAVENOUS

## 2021-10-10 MED ORDER — FENTANYL CITRATE (PF) 100 MCG/2ML IJ SOLN
INTRAMUSCULAR | Status: AC
  Filled 2021-10-10: qty 2

## 2021-10-10 NOTE — Procedures (Signed)
Interventional Radiology Procedure Note  Procedure: CT guided aspirate and core biopsy of right iliac bone  Complications: None  Recommendations: - Bedrest supine x 1 hrs - Hydrocodone PRN  Pain - Follow biopsy results   Jojo Geving, MD   

## 2021-10-10 NOTE — Discharge Instructions (Signed)
For questions /concerns may call Interventional Radiology at [phone] ° °You may remove your dressing and shower tomorrow afternoon ° ° ° °Bone Marrow Aspiration and Bone Marrow Biopsy, Adult, Care After °This sheet gives you information about how to care for yourself after your procedure. Your health care provider may also give you more specific instructions. If you have problems or questions, contact your health careprovider. °What can I expect after the procedure? °After the procedure, it is common to have: °Mild pain and tenderness. °Swelling. °Bruising. °Follow these instructions at home: °Puncture site care °Follow instructions from your health care provider about how to take care of the puncture site. Make sure you: °Wash your hands with soap and water before and after you change your bandage (dressing). If soap and water are not available, use hand sanitizer. °Change your dressing as told by your health care provider. °Check your puncture site every day for signs of infection. Check for: °More redness, swelling, or pain. °Fluid or blood. °Warmth. °Pus or a bad smell. ° °Activity °Return to your normal activities as told by your health care provider. Ask your health care provider what activities are safe for you. °Do not lift anything that is heavier than 10 lb (4.5 kg), or the limit that you are told, until your health care provider says that it is safe. °Do not drive for 24 hours if you were given a sedative during your procedure. °General instructions °Take over-the-counter and prescription medicines only as told by your health care provider. °Do not take baths, swim, or use a hot tub until your health care provider approves. Ask your health care provider if you may take showers. You may only be allowed to take sponge baths. °If directed, put ice on the affected area. To do this: °Put ice in a plastic bag. °Place a towel between your skin and the bag. °Leave the ice on for 20 minutes, 2-3 times a  day. °Keep all follow-up visits as told by your health care provider. This is important. ° °Contact a health care provider if: °Your pain is not controlled with medicine. °You have a fever. °You have more redness, swelling, or pain around the puncture site. °You have fluid or blood coming from the puncture site. °Your puncture site feels warm to the touch. °You have pus or a bad smell coming from the puncture site. °Summary °After the procedure, it is common to have mild pain, tenderness, swelling, and bruising. °Follow instructions from your health care provider about how to take care of the puncture site and what activities are safe for you. °Take over-the-counter and prescription medicines only as told by your health care provider. °Contact a health care provider if you have any signs of infection, such as fluid or blood coming from the puncture site. °This information is not intended to replace advice given to you by your health care provider. Make sure you discuss any questions you have with your healthcare provider. ° ° °Moderate Conscious Sedation, Adult, Care After °This sheet gives you information about how to care for yourself after your procedure. Your health care provider may also give you more specific instructions. If you have problems or questions, contact your health careprovider. °What can I expect after the procedure? °After the procedure, it is common to have: °Sleepiness for several hours. °Impaired judgment for several hours. °Difficulty with balance. °Vomiting if you eat too soon. °Follow these instructions at home: °For the time period you were told by your health care provider: °  Rest. °Do not participate in activities where you could fall or become injured. °Do not drive or use machinery. °Do not drink alcohol. °Do not take sleeping pills or medicines that cause drowsiness. °Do not make important decisions or sign legal documents. °Do not take care of children on your own. °Eating and  drinking ° °Follow the diet recommended by your health care provider. °Drink enough fluid to keep your urine pale yellow. °If you vomit: °Drink water, juice, or soup when you can drink without vomiting. °Make sure you have little or no nausea before eating solid foods. ° °General instructions °Take over-the-counter and prescription medicines only as told by your health care provider. °Have a responsible adult stay with you for the time you are told. It is important to have someone help care for you until you are awake and alert. °Do not smoke. °Keep all follow-up visits as told by your health care provider. This is important. °Contact a health care provider if: °You are still sleepy or having trouble with balance after 24 hours. °You feel light-headed. °You keep feeling nauseous or you keep vomiting. °You develop a rash. °You have a fever. °You have redness or swelling around the IV site. °Get help right away if: °You have trouble breathing. °You have new-onset confusion at home. °Summary °After the procedure, it is common to feel sleepy, have impaired judgment, or feel nauseous if you eat too soon. °Rest after you get home. Know the things you should not do after the procedure. °Follow the diet recommended by your health care provider and drink enough fluid to keep your urine pale yellow. °Get help right away if you have trouble breathing or new-onset confusion at home. °This information is not intended to replace advice given to you by your health care provider. Make sure you discuss any questions you have with your healthcare provider. °Document Revised: 03/05/2020 Document Reviewed: 10/02/2019 °Elsevier Patient Education © 2022 [employer]  °

## 2021-10-10 NOTE — H&P (Signed)
Chief Complaint: Patient was seen in consultation today for image guided bone marrow biopsy and aspiration at the request of Reese T IV  Referring Physician(s): Dorsey,John T IV  Supervising Physician: Ruthann Cancer  Patient Status: [facility] - Out-pt  History of Present Illness: Carol [name] is a 65 y.o. female with PMH of DM type II, hypothyroidism, OSA, and kidney cancer.  Patient had PET CT scan that showed hyper metabolic activity at C2 and C7.  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  Dr. Narda Rutherford referred patient to IR today for bone marrow biopsy and aspiration for concern of multiple myeloma versus plasmacytoma.  Past Medical History:  Diagnosis Date   Allergy    Ankle edema    both ankles   Arthritis    hands,knees,elbows   Cancer ([place]) 2016   tumor kidney   Diabetes mellitus    type 2    Eczema    History of kidney stones    Hypothyroidism    Obesity    Sleep apnea    cpap machine setting of 3   Thyroid disease     Past Surgical History:  Procedure Laterality Date   BREAST BIOPSY Right 2001   CHOLECYSTECTOMY  2009   open   COLONOSCOPY     CYSTOSCOPY W/ RETROGRADES Left 11/22/2018   Procedure: CYSTOSCOPY WITH RETROGRADE PYELOGRAM;  Surgeon: Ardis Hughs, MD;  Location: [facility];  Service: Urology;  Laterality: Left;   CYSTOSCOPY WITH RETROGRADE PYELOGRAM, URETEROSCOPY AND STENT PLACEMENT Right 10/03/2021   Procedure: CYSTOSCOPY WITH RETROGRADE PYELOGRAM, URETEROSCOPY,STONE EXTRACTION AND STENT PLACEMENT;  Surgeon: Ceasar Mons, MD;  Location: [facility];  Service: Urology;  Laterality: Right;   DILATATION & CURRETTAGE/HYSTEROSCOPY WITH RESECTOCOPE N/A 01/21/2013   Procedure: DILATATION & CURETTAGE/HYSTEROSCOPY WITH RESECTOCOPE AND RESECTION OF ENDOMETRIAL;  Surgeon: Selinda Orion, MD;  Location: [facility];  Service: Gynecology;  Laterality: N/A;   KNEE ARTHROSCOPY     left     POLYPECTOMY     POSTERIOR CERVICAL FUSION/FORAMINOTOMY N/A 08/25/2021   Procedure: Occiput to Cervical 5 Posterior cervical instrumented fusion with open biopsy of Cervical 2 Mass. Cervical Two Ganglionectomy;  Surgeon: Judith Part, MD;  Location: [facility];  Service: Neurosurgery;  Laterality: N/A;   ROBOT ASSISTED PYELOPLASTY Left 11/22/2018   Procedure: XI ROBOTIC ASSISTED ATTEMPTED PYELOPLASTY CONVERTED TO NEPHRECTOMY/LYSIS OF ADHESIONS/UMBILICAL HERNIA REPAIR;  Surgeon: Ardis Hughs, MD;  Location: [facility];  Service: Urology;  Laterality: Left;   ROBOTIC ASSITED PARTIAL NEPHRECTOMY Left 06/11/2015   Procedure: LEFT ROBOTIC ASSISTED LAPAROSCOPIC  PARTIAL NEPHRECTOMY;  Surgeon: Ardis Hughs, MD;  Location: [facility];  Service: Urology;  Laterality: Left;   ROTATOR CUFF REPAIR Left 2011   URETERAL BIOPSY Left 02/14/2017   Procedure: URETERAL BIOPSY;  Surgeon: Ardis Hughs, MD;  Location: [facility];  Service: Urology;  Laterality: Left;   URETEROSCOPY WITH HOLMIUM LASER LITHOTRIPSY Left 02/14/2017   Procedure: URETEROSCOPY LITHOTRIPSY, URETERAL BALLOON DILATION;  Surgeon: Ardis Hughs, MD;  Location: [facility];  Service: Urology;  Laterality: Left;  With STENT    Allergies: Morphine  Medications: Prior to Admission medications   Medication Sig Start Date End Date Taking? Authorizing Provider  acetaminophen (TYLENOL) 500 MG tablet Take 1,000 mg by mouth every 6 (six) hours as needed for mild pain.   Yes [provider]  Ascorbic Acid (VITAMIN C ADULT GUMMIES PO) Take 1 tablet by mouth daily.  Yes [provider]  atorvastatin (LIPITOR) 20 MG tablet Take 20 mg by mouth at bedtime. 08/06/15  Yes [provider]  Cholecalciferol (VITAMIN D3) 25 MCG (1000 UT) CAPS Take 1,000 Units by mouth daily.   Yes [provider]  cyclobenzaprine (FLEXERIL) 10 MG tablet Take 10 mg by mouth 3 (three) times daily as needed for muscle spasms. 09/24/21  Yes  [provider]  furosemide (LASIX) 20 MG tablet Take 20 mg by mouth daily as needed for fluid or edema.   Yes [provider]  levothyroxine (SYNTHROID) 75 MCG tablet Take 75 mcg by mouth daily before breakfast. 04/14/21  Yes [provider]  metFORMIN (GLUCOPHAGE) 1000 MG tablet Take 1,000 mg by mouth daily with breakfast.   Yes [provider]  ondansetron (ZOFRAN ODT) 4 MG disintegrating tablet Take 1 tablet (4 mg total) by mouth every 8 (eight) hours as needed for nausea or vomiting. 10/03/21  Yes Ceasar Mons, MD  oxyCODONE (OXY IR/ROXICODONE) 5 MG immediate release tablet Take 1 tablet (5 mg total) by mouth every 4 (four) hours as needed for moderate pain ((score 4 to 6)). 10/03/21  Yes Ceasar Mons, MD  OZEMPIC, 0.25 OR 0.5 MG/DOSE, 2 MG/1.5ML SOPN Inject 0.5 mg into the skin every Saturday. 07/19/21   [provider]  PROAIR HFA 108 (90 BASE) MCG/ACT inhaler Inhale 2 puffs into the lungs every 4 (four) hours as needed for wheezing or shortness of breath. Uses seasonal Patient not taking: Reported on 09/12/2021 02/12/15   [provider]     Family History  Problem Relation Age of Onset   Cancer Father     Social History   Socioeconomic History   Marital status: Married    Spouse name: Not on file   Number of children: Not on file   Years of education: Not on file   Highest education level: Not on file  Occupational History   Not on file  Tobacco Use   Smoking status: Never   Smokeless tobacco: Never  Vaping Use   Vaping Use: Never used  Substance and Sexual Activity   Alcohol use: Yes    Comment: occ   Drug use: No   Sexual activity: Not on file  Other Topics Concern   Not on file  Social History Narrative   Not on file   Social Determinants of Health   Financial Resource Strain: Not on file  Food Insecurity: Not on file  Transportation Needs: Not on file  Physical Activity: Not on file   Stress: Not on file  Social Connections: Not on file      Review of Systems: A 12 point ROS discussed and pertinent positives are indicated in the HPI above.  All other systems are negative.  Review of Systems  Constitutional:  Negative for chills and fever.  HENT:  Negative for nosebleeds.   Eyes:  Negative for visual disturbance.  Respiratory:  Negative for cough and shortness of breath.   Cardiovascular:  Negative for chest pain and leg swelling.  Gastrointestinal:  Negative for abdominal pain, nausea and vomiting.  Genitourinary:  Negative for hematuria.  Neurological:  Negative for dizziness, light-headedness and headaches.   Vital Signs: BP 140/86   Pulse (!) 116   Temp 98.1 F (36.7 C) (Oral)   Resp 16   Ht _0  (1.575 m)   Wt 212 lb (96.2 kg)   SpO2 100%   BMI 38.78 kg/m   Physical Exam  Constitutional:      Appearance: Normal appearance. She is not ill-appearing.  HENT:     Head: Normocephalic and atraumatic.     Mouth/Throat:     Mouth: Mucous membranes are moist.     Pharynx: Oropharynx is clear.  Eyes:     Pupils: Pupils are equal, round, and reactive to light.  Cardiovascular:     Rate and Rhythm: Regular rhythm. Tachycardia present.     Pulses: Normal pulses.     Heart sounds: Normal heart sounds. No murmur heard.   No friction rub. No gallop.  Pulmonary:     Effort: Pulmonary effort is normal. No respiratory distress.     Breath sounds: Normal breath sounds. No stridor. No wheezing, rhonchi or rales.  Abdominal:     General: Bowel sounds are normal.     Palpations: Abdomen is soft.     Tenderness: There is no abdominal tenderness. There is no guarding.  Musculoskeletal:     Right lower leg: No edema.     Left lower leg: No edema.  Skin:    General: Skin is warm and dry.  Neurological:     Mental Status: She is alert and oriented to person, place, and time.  Psychiatric:        Mood and Affect: Mood normal.        Behavior: Behavior normal.         Thought Content: Thought content normal.        Judgment: Judgment normal.  Today's labs within normal limits  Imaging: DG C-Arm 1-60 Min  Result Date: 10/03/2021 CLINICAL DATA:  RIGHT-sided stone. EXAM: DG C-ARM 1-60 MIN CONTRAST:  Not given FLUOROSCOPY TIME:  Not given. COMPARISON:  10/03/2021 CT FINDINGS: Contrast fills the RIGHT ureter in a retrograde fashion. There is focal defect in the proximal ureter on earlier images. Delayed images demonstrate a RIGHT double-J ureteral stent. IMPRESSION: Placement of RIGHT ureteral stent. Electronically Signed   By: Nolon Nations M.D.   On: 10/03/2021 14:09   CT Renal Stone Study  Result Date: 10/03/2021 CLINICAL DATA:  Flank pain, kidney stone suspected. EXAM: CT ABDOMEN AND PELVIS WITHOUT CONTRAST TECHNIQUE: Multidetector CT imaging of the abdomen and pelvis was performed following the standard protocol without IV contrast. COMPARISON:  09/16/2019. FINDINGS: Lower chest: No acute abnormality. Hepatobiliary: No focal liver abnormality is seen. Status post cholecystectomy. No biliary dilatation. Pancreas: Unremarkable. No pancreatic ductal dilatation or surrounding inflammatory changes. Spleen: Normal in size without focal abnormality. Adrenals/Urinary Tract: No adrenal nodule or mass. The left kidney is surgically absent and an irregular fat containing lesion is noted in the surgical bed measuring 2.8 x 1.5 cm, not significantly changed from the prior exam, possible fat necrosis or scarring. There is no renal calculus on the right. There is mild hydroureteronephrosis with a 2 mm calculus in the mid right ureter, axial image 51. The urinary bladder is unremarkable. Stomach/Bowel: No bowel obstruction, free air or pneumatosis. Scattered diverticular present along the colon without evidence of diverticulitis. The appendix is unremarkable. No focal bowel wall thickening. Vascular/Lymphatic: Aortic atherosclerosis. No enlarged abdominal or pelvic lymph  nodes. Reproductive: Uterus and bilateral adnexa are unremarkable. Other: No free fluid. Musculoskeletal: Osteopenia is noted. There are degenerative changes in the thoracolumbar spine. Questionable subtle tiny lytic lesions in the lumbar spine. No acute osseous abnormality is identified. IMPRESSION: 1. Mild to moderate obstructive uropathy on the right with a 2 mm calculus in the mid right ureter. 2. Status post left  nephrectomy with fat necrosis/scarring in the surgical bed. 3. Questionable tiny lytic lesions in the lumbar spine, may be associated with osteopenia versus metastatic disease. Attention on follow-up is recommended. Electronically Signed   By: Brett Fairy M.D.   On: 10/03/2021 01:52    Labs:  CBC: Recent Labs    08/25/21 1230 09/12/21 0951 10/03/21 0006 10/10/21 0718  WBC 3.9* 4.3 8.1 4.7  HGB 14.3 10.1* 12.4 13.6  HCT 43.2 30.5* 36.7 39.6  PLT 176 286 217 271    COAGS: Recent Labs    10/10/21 0718  INR 1.0    BMP: Recent Labs    08/18/21 1707 08/25/21 1230 09/12/21 0951 10/03/21 0006  NA  --  141 141 137  K  --  3.8 3.9 3.8  CL  --  109 108 101  CO2  --  _0 GLUCOSE  --  124* 129* 202*  BUN  --  _1 CALCIUM  --  9.6 9.3 9.6  CREATININE 1.00 0.93 0.86 1.16*  GFRNONAA  --  >60 >60 52*    LIVER FUNCTION TESTS: Recent Labs    09/12/21 0951 10/03/21 0006  BILITOT 0.7 1.1  AST 17 16  ALT 15 17  ALKPHOS 68 83  PROT 7.3 7.1  ALBUMIN 4.1 4.2    TUMOR MARKERS: No results for input(s): AFPTM, CEA, CA199, CHROMGRNA in the last 8760 hours.  Assessment and Plan: History of DM type II, hypothyroidism, OSA, and kidney cancer.  Patient had PET CT scan that showed hyper metabolic activity at C2 and C7.  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  Dr. Narda Rutherford referred patient to IR today for bone marrow biopsy and aspiration for concern of multiple myeloma versus plasmacytoma.  Pt resting quietly on  stretcher. She is is A&O, calm and pleasant. She is in no distress.  Pt states she is NPO per order.  Today's labs within normal limits Patient tachycardic at 116, CBG 202, other vital signs stable.  Risks and benefits of bone marrow biopsy and aspiration was discussed with the patient and/or patient's family including, but not limited to bleeding, infection, damage to adjacent structures or low yield requiring additional tests.  All of the questions were answered and there is agreement to proceed.  Consent signed and in chart.   Thank you for this interesting consult.  I greatly enjoyed meeting Carol [name] and look forward to participating in their care.  A copy of this report was sent to the requesting provider on this date.  Electronically Signed: Tyson Alias, NP 10/10/2021, 8:18 AM   I spent a total of 30-minute in face to face in clinical consultation, greater than 50% of which was counseling/coordinating care for bone marrow biopsy and aspiration.

## 2021-10-17 ENCOUNTER — Inpatient Hospital Stay: Payer: BC Managed Care – PPO | Attending: Radiation Oncology | Admitting: Hematology and Oncology

## 2021-10-17 ENCOUNTER — Telehealth: Payer: Self-pay | Admitting: Pharmacist

## 2021-10-17 ENCOUNTER — Other Ambulatory Visit: Payer: Self-pay

## 2021-10-17 ENCOUNTER — Inpatient Hospital Stay: Payer: BC Managed Care – PPO

## 2021-10-17 ENCOUNTER — Other Ambulatory Visit: Payer: Self-pay | Admitting: Hematology and Oncology

## 2021-10-17 VITALS — BP 125/85 | HR 88 | Temp 97.5°F | Resp 18 | Wt 205.0 lb

## 2021-10-17 DIAGNOSIS — C9030 Solitary plasmacytoma not having achieved remission: Secondary | ICD-10-CM | POA: Diagnosis not present

## 2021-10-17 DIAGNOSIS — Z885 Allergy status to narcotic agent status: Secondary | ICD-10-CM | POA: Diagnosis not present

## 2021-10-17 DIAGNOSIS — Z9049 Acquired absence of other specified parts of digestive tract: Secondary | ICD-10-CM | POA: Diagnosis not present

## 2021-10-17 DIAGNOSIS — C9000 Multiple myeloma not having achieved remission: Secondary | ICD-10-CM

## 2021-10-17 DIAGNOSIS — N132 Hydronephrosis with renal and ureteral calculous obstruction: Secondary | ICD-10-CM | POA: Insufficient documentation

## 2021-10-17 DIAGNOSIS — M8580 Other specified disorders of bone density and structure, unspecified site: Secondary | ICD-10-CM | POA: Diagnosis not present

## 2021-10-17 DIAGNOSIS — Z809 Family history of malignant neoplasm, unspecified: Secondary | ICD-10-CM | POA: Insufficient documentation

## 2021-10-17 DIAGNOSIS — Z905 Acquired absence of kidney: Secondary | ICD-10-CM | POA: Diagnosis not present

## 2021-10-17 DIAGNOSIS — Z923 Personal history of irradiation: Secondary | ICD-10-CM | POA: Diagnosis not present

## 2021-10-17 DIAGNOSIS — I700 Atherosclerosis of aorta: Secondary | ICD-10-CM | POA: Insufficient documentation

## 2021-10-17 DIAGNOSIS — Z79899 Other long term (current) drug therapy: Secondary | ICD-10-CM | POA: Diagnosis not present

## 2021-10-17 LAB — CBC WITH DIFFERENTIAL (CANCER CENTER ONLY)
Abs Immature Granulocytes: 0.02 10*3/uL (ref 0.00–0.07)
Basophils Absolute: 0 10*3/uL (ref 0.0–0.1)
Basophils Relative: 1 %
Eosinophils Absolute: 0.1 10*3/uL (ref 0.0–0.5)
Eosinophils Relative: 2 %
HCT: 36.2 % (ref 36.0–46.0)
Hemoglobin: 12.3 g/dL (ref 12.0–15.0)
Immature Granulocytes: 1 %
Lymphocytes Relative: 22 %
Lymphs Abs: 1 10*3/uL (ref 0.7–4.0)
MCH: 30 pg (ref 26.0–34.0)
MCHC: 34 g/dL (ref 30.0–36.0)
MCV: 88.3 fL (ref 80.0–100.0)
Monocytes Absolute: 0.3 10*3/uL (ref 0.1–1.0)
Monocytes Relative: 6 %
Neutro Abs: 3 10*3/uL (ref 1.7–7.7)
Neutrophils Relative %: 68 %
Platelet Count: 199 10*3/uL (ref 150–400)
RBC: 4.1 MIL/uL (ref 3.87–5.11)
RDW: 13.3 % (ref 11.5–15.5)
WBC Count: 4.4 10*3/uL (ref 4.0–10.5)
nRBC: 0 % (ref 0.0–0.2)

## 2021-10-17 LAB — CMP (CANCER CENTER ONLY)
ALT: 22 U/L (ref 0–44)
AST: 19 U/L (ref 15–41)
Albumin: 4.3 g/dL (ref 3.5–5.0)
Alkaline Phosphatase: 79 U/L (ref 38–126)
Anion gap: 12 (ref 5–15)
BUN: 15 mg/dL (ref 8–23)
CO2: 23 mmol/L (ref 22–32)
Calcium: 9.4 mg/dL (ref 8.9–10.3)
Chloride: 107 mmol/L (ref 98–111)
Creatinine: 1.11 mg/dL — ABNORMAL HIGH (ref 0.44–1.00)
GFR, Estimated: 55 mL/min — ABNORMAL LOW (ref >60)
Glucose, Bld: 143 mg/dL — ABNORMAL HIGH (ref 70–99)
Potassium: 3.9 mmol/L (ref 3.5–5.1)
Sodium: 142 mmol/L (ref 135–145)
Total Bilirubin: 0.9 mg/dL (ref 0.3–1.2)
Total Protein: 6.8 g/dL (ref 6.5–8.1)

## 2021-10-17 LAB — LACTATE DEHYDROGENASE: LDH: 195 U/L — ABNORMAL HIGH (ref 98–192)

## 2021-10-17 MED ORDER — LENALIDOMIDE 25 MG PO CAPS: 25.0000 mg | ORAL_CAPSULE | Freq: Every day | ORAL | 0 refills | Status: DC

## 2021-10-17 NOTE — Progress Notes (Signed)
[place] [phone]   [phone]  PROGRESS NOTE  Patient Care Team: [name] as PCP - General (Physician Assistant)  Hematological/Oncological History # Plasmacytoma with Multiple Myeloma  07/29/2021: CT cervical spine shows expansile lytic lesion which almost completely replaces the normal C2 vertebral body  08/24/2021: PET CT scan shows increased metabolic activity at C2 and C7  08/25/2021: biopsy of C2 lesion showed finding consistent with plasma cell neoplasm.  09/08/2021: start radiation therapy 09/12/2021: establish care with Dr. Lorenso Courier  09/28/2021: end radiation therapy 10/10/2021: Bone marrow biopsy. Results confirm plasma cell neoplasm consistent with multiple myeloma, kappa restricted.   Interval History:  Carol [name] 65 y.o. female with medical history significant for multiple myeloma with plasmacytoma who presents for a follow up visit. The patient's last visit was on 09/12/2021. In the interim since the last visit she has completed radiation treatment and had a bone marrow biopsy performed which confirmed the diagnosis of multiple myeloma.  On exam today Carol [name] reports that her bone marrow biopsy went well.  She notes that she does not have any pain, bleeding, or bruising after the procedure.  She unfortunately did undergo a surgical removal of a kidney stone approximately 3 weeks ago.  She notes that she feels much better after that was performed.  She is also currently wearing a chin lift device in order to try to help with her posterior given the spinal issues.  She otherwise denies any fevers, chills, sweats, nausea, vomiting or diarrhea.  She tolerated radiation therapy well.  The bulk of our discussion focused on the treatment of multiple myeloma which was found on the bone marrow biopsy.  The patient voiced understanding of the plan moving forward.  MEDICAL HISTORY:  Past Medical History:  Diagnosis Date   Allergy     Ankle edema    both ankles   Arthritis    hands,knees,elbows   Cancer ([place]) 2016   tumor kidney   Diabetes mellitus    type 2    Eczema    History of kidney stones    Hypothyroidism    Obesity    Sleep apnea    cpap machine setting of 3   Thyroid disease     SURGICAL HISTORY: Past Surgical History:  Procedure Laterality Date   BREAST BIOPSY Right 2001   CHOLECYSTECTOMY  2009   open   COLONOSCOPY     CYSTOSCOPY W/ RETROGRADES Left 11/22/2018   Procedure: CYSTOSCOPY WITH RETROGRADE PYELOGRAM;  Surgeon: Ardis Hughs, MD;  Location: [facility];  Service: Urology;  Laterality: Left;   CYSTOSCOPY WITH RETROGRADE PYELOGRAM, URETEROSCOPY AND STENT PLACEMENT Right 10/03/2021   Procedure: CYSTOSCOPY WITH RETROGRADE PYELOGRAM, URETEROSCOPY,STONE EXTRACTION AND STENT PLACEMENT;  Surgeon: Ceasar Mons, MD;  Location: [facility];  Service: Urology;  Laterality: Right;   DILATATION & CURRETTAGE/HYSTEROSCOPY WITH RESECTOCOPE N/A 01/21/2013   Procedure: DILATATION & CURETTAGE/HYSTEROSCOPY WITH RESECTOCOPE AND RESECTION OF ENDOMETRIAL;  Surgeon: Selinda Orion, MD;  Location: [facility];  Service: Gynecology;  Laterality: N/A;   KNEE ARTHROSCOPY     left    POLYPECTOMY     POSTERIOR CERVICAL FUSION/FORAMINOTOMY N/A 08/25/2021   Procedure: Occiput to Cervical 5 Posterior cervical instrumented fusion with open biopsy of Cervical 2 Mass. Cervical Two Ganglionectomy;  Surgeon: Judith Part, MD;  Location: [facility];  Service: Neurosurgery;  Laterality: N/A;   ROBOT ASSISTED PYELOPLASTY Left 11/22/2018   Procedure: XI ROBOTIC ASSISTED ATTEMPTED PYELOPLASTY  CONVERTED TO NEPHRECTOMY/LYSIS OF ADHESIONS/UMBILICAL HERNIA REPAIR;  Surgeon: Ardis Hughs, MD;  Location: [facility];  Service: Urology;  Laterality: Left;   ROBOTIC ASSITED PARTIAL NEPHRECTOMY Left 06/11/2015   Procedure: LEFT ROBOTIC ASSISTED LAPAROSCOPIC  PARTIAL NEPHRECTOMY;  Surgeon: Ardis Hughs, MD;   Location: [facility];  Service: Urology;  Laterality: Left;   ROTATOR CUFF REPAIR Left 2011   URETERAL BIOPSY Left 02/14/2017   Procedure: URETERAL BIOPSY;  Surgeon: Ardis Hughs, MD;  Location: [facility];  Service: Urology;  Laterality: Left;   URETEROSCOPY WITH HOLMIUM LASER LITHOTRIPSY Left 02/14/2017   Procedure: URETEROSCOPY LITHOTRIPSY, URETERAL BALLOON DILATION;  Surgeon: Ardis Hughs, MD;  Location: [facility];  Service: Urology;  Laterality: Left;  With STENT    SOCIAL HISTORY: Social History   Socioeconomic History   Marital status: Married    Spouse name: Not on file   Number of children: Not on file   Years of education: Not on file   Highest education level: Not on file  Occupational History   Not on file  Tobacco Use   Smoking status: Never   Smokeless tobacco: Never  Vaping Use   Vaping Use: Never used  Substance and Sexual Activity   Alcohol use: Yes    Comment: occ   Drug use: No   Sexual activity: Not on file  Other Topics Concern   Not on file  Social History Narrative   Not on file   Social Determinants of Health   Financial Resource Strain: Not on file  Food Insecurity: Not on file  Transportation Needs: Not on file  Physical Activity: Not on file  Stress: Not on file  Social Connections: Not on file  Intimate Partner Violence: Not on file    FAMILY HISTORY: Family History  Problem Relation Age of Onset   Cancer Father     ALLERGIES:  is allergic to morphine.  MEDICATIONS:  Current Outpatient Medications  Medication Sig Dispense Refill   acetaminophen (TYLENOL) 500 MG tablet Take 1,000 mg by mouth every 6 (six) hours as needed for mild pain.     Ascorbic Acid (VITAMIN C ADULT GUMMIES PO) Take 1 tablet by mouth daily.     atorvastatin (LIPITOR) 20 MG tablet Take 20 mg by mouth at bedtime.     Cholecalciferol (VITAMIN D3) 25 MCG (1000 UT) CAPS Take 1,000 Units by mouth daily.     cyclobenzaprine (FLEXERIL) 10 MG tablet Take 10 mg by mouth  3 (three) times daily as needed for muscle spasms.     furosemide (LASIX) 20 MG tablet Take 20 mg by mouth daily as needed for fluid or edema.     levothyroxine (SYNTHROID) 75 MCG tablet Take 75 mcg by mouth daily before breakfast.     metFORMIN (GLUCOPHAGE) 1000 MG tablet Take 1,000 mg by mouth daily with breakfast.     ondansetron (ZOFRAN ODT) 4 MG disintegrating tablet Take 1 tablet (4 mg total) by mouth every 8 (eight) hours as needed for nausea or vomiting. 30 tablet 0   oxyCODONE (OXY IR/ROXICODONE) 5 MG immediate release tablet Take 1 tablet (5 mg total) by mouth every 4 (four) hours as needed for moderate pain ((score 4 to 6)). 15 tablet 0   OZEMPIC, 0.25 OR 0.5 MG/DOSE, 2 MG/1.5ML SOPN Inject 0.5 mg into the skin every Saturday.     PROAIR HFA 108 (90 BASE) MCG/ACT inhaler Inhale 2 puffs into the lungs every 4 (four) hours as needed for wheezing or  shortness of breath. Uses seasonal (Patient not taking: Reported on 09/12/2021)  3   No current facility-administered medications for this visit.    REVIEW OF SYSTEMS:   Constitutional: ( - ) fevers, ( - )  chills , ( - ) night sweats Eyes: ( - ) blurriness of vision, ( - ) double vision, ( - ) watery eyes Ears, nose, mouth, throat, and face: ( - ) mucositis, ( - ) sore throat Respiratory: ( - ) cough, ( - ) dyspnea, ( - ) wheezes Cardiovascular: ( - ) palpitation, ( - ) chest discomfort, ( - ) lower extremity swelling Gastrointestinal:  ( - ) nausea, ( - ) heartburn, ( - ) change in bowel habits Skin: ( - ) abnormal skin rashes Lymphatics: ( - ) new lymphadenopathy, ( - ) easy bruising Neurological: ( - ) numbness, ( - ) tingling, ( - ) new weaknesses Behavioral/Psych: ( - ) mood change, ( - ) new changes  All other systems were reviewed with the patient and are negative.  PHYSICAL EXAMINATION: ECOG PERFORMANCE STATUS: 1 - Symptomatic but completely ambulatory  Vitals:   10/17/21 0823  BP: 125/85  Pulse: 88  Resp: 18  Temp: (!)  97.5 F (36.4 C)  SpO2: 100%   Filed Weights   10/17/21 0823  Weight: 205 lb (93 kg)    GENERAL: Well-appearing middle-age Caucasian female alert, no distress and comfortable SKIN: skin color, texture, turgor are normal, no rashes or significant lesions EYES: conjunctiva are pink and non-injected, sclera clear LUNGS: clear to auscultation and percussion with normal breathing effort HEART: regular rate & rhythm and no murmurs and no lower extremity edema Musculoskeletal: no cyanosis of digits and no clubbing  PSYCH: alert & oriented x 3, fluent speech NEURO: no focal motor/sensory deficits  LABORATORY DATA:  I have reviewed the data as listed CBC Latest Ref Rng & Units 10/17/2021 2021/10/22 10/03/2021  WBC 4.0 - 10.5 K/uL 4.4 4.7 8.1  Hemoglobin 12.0 - 15.0 g/dL 12.3 13.6 12.4  Hematocrit 36.0 - 46.0 % 36.2 39.6 36.7  Platelets 150 - 400 K/uL 199 271 217    CMP Latest Ref Rng & Units 10/03/2021 09/12/2021 08/25/2021  Glucose 70 - 99 mg/dL 202(H) 129(H) 124(H)  BUN 8 - 23 mg/dL _0 Creatinine 0.44 - 1.00 mg/dL 1.16(H) 0.86 0.93  Sodium 135 - 145 mmol/L 137 141 141  Potassium 3.5 - 5.1 mmol/L 3.8 3.9 3.8  Chloride 98 - 111 mmol/L 101 108 109  CO2 22 - 32 mmol/L _1 Calcium 8.9 - 10.3 mg/dL 9.6 9.3 9.6  Total Protein 6.5 - 8.1 g/dL 7.1 7.3 -  Total Bilirubin 0.3 - 1.2 mg/dL 1.1 0.7 -  Alkaline Phos 38 - 126 U/L 83 68 -  AST 15 - 41 U/L 16 17 -  ALT 0 - 44 U/L 17 15 -    Lab Results  Component Value Date   MPROTEIN Not Observed 09/12/2021   Lab Results  Component Value Date   KPAFRELGTCHN 280.3 (H) 09/12/2021   LAMBDASER 14.2 09/12/2021   KAPLAMBRATIO 12.12 09/15/2021   KAPLAMBRATIO 19.74 (H) 09/12/2021    RADIOGRAPHIC STUDIES: CT BIOPSY  Result Date: 22-Oct-2021 INDICATION: 66 year old female with clinical concern for multiple myeloma. EXAM: CT-GUIDED BONE MARROW BIOPSY AND ASPIRATION MEDICATIONS: None ANESTHESIA/SEDATION: Fentanyl 100 mcg IV; Versed 3  mg IV Sedation Time: 10 minutes; The patient was continuously monitored during the procedure by the interventional radiology nurse under my  direct supervision. COMPLICATIONS: None immediate. PROCEDURE: Informed consent was obtained from the patient following an explanation of the procedure, risks, benefits and alternatives. The patient understands, agrees and consents for the procedure. All questions were addressed. A time out was performed prior to the initiation of the procedure. The patient was positioned prone and non-contrast localization CT was performed of the pelvis to demonstrate the iliac marrow spaces. The operative site was prepped and draped in the usual sterile fashion. Under sterile conditions and local anesthesia, a 22 gauge spinal needle was utilized for procedural planning. Next, an 11 gauge coaxial bone biopsy needle was advanced into the right iliac marrow space. Needle position was confirmed with CT imaging. Initially, a bone marrow aspiration was performed. Next, a bone marrow biopsy was obtained with the 11 gauge outer bone marrow device. The 11 gauge coaxial bone biopsy needle was re-advanced into a slightly different location within the right iliac marrow space, positioning was confirmed with CT imaging and an additional bone marrow biopsy was obtained. Samples were prepared with the cytotechnologist and deemed adequate. The needle was removed and superficial hemostasis was obtained with manual compression. A dressing was applied. The patient tolerated the procedure well without immediate post procedural complication. IMPRESSION: Successful CT guided right iliac bone marrow aspiration and core biopsy. Ruthann Cancer, MD [facility] Radiology Electronically Signed   By: Ruthann Cancer M.D.   On: 10/10/2021 10:11   CT BONE MARROW BIOPSY & ASPIRATION  Result Date: 10/10/2021 INDICATION: 64 year old female with clinical concern for multiple  myeloma. EXAM: CT-GUIDED BONE MARROW BIOPSY AND ASPIRATION MEDICATIONS: None ANESTHESIA/SEDATION: Fentanyl 100 mcg IV; Versed 3 mg IV Sedation Time: 10 minutes; The patient was continuously monitored during the procedure by the interventional radiology nurse under my direct supervision. COMPLICATIONS: None immediate. PROCEDURE: Informed consent was obtained from the patient following an explanation of the procedure, risks, benefits and alternatives. The patient understands, agrees and consents for the procedure. All questions were addressed. A time out was performed prior to the initiation of the procedure. The patient was positioned prone and non-contrast localization CT was performed of the pelvis to demonstrate the iliac marrow spaces. The operative site was prepped and draped in the usual sterile fashion. Under sterile conditions and local anesthesia, a 22 gauge spinal needle was utilized for procedural planning. Next, an 11 gauge coaxial bone biopsy needle was advanced into the right iliac marrow space. Needle position was confirmed with CT imaging. Initially, a bone marrow aspiration was performed. Next, a bone marrow biopsy was obtained with the 11 gauge outer bone marrow device. The 11 gauge coaxial bone biopsy needle was re-advanced into a slightly different location within the right iliac marrow space, positioning was confirmed with CT imaging and an additional bone marrow biopsy was obtained. Samples were prepared with the cytotechnologist and deemed adequate. The needle was removed and superficial hemostasis was obtained with manual compression. A dressing was applied. The patient tolerated the procedure well without immediate post procedural complication. IMPRESSION: Successful CT guided right iliac bone marrow aspiration and core biopsy. Ruthann Cancer, MD [facility] Radiology Electronically Signed   By: Ruthann Cancer M.D.   On: 10/10/2021 10:11   DG  C-Arm 1-60 Min  Result Date: 10/03/2021 CLINICAL DATA:  RIGHT-sided stone. EXAM: DG C-ARM 1-60 MIN CONTRAST:  Not given FLUOROSCOPY TIME:  Not given. COMPARISON:  10/03/2021 CT FINDINGS: Contrast fills the RIGHT ureter in a retrograde fashion. There is focal  defect in the proximal ureter on earlier images. Delayed images demonstrate a RIGHT double-J ureteral stent. IMPRESSION: Placement of RIGHT ureteral stent. Electronically Signed   By: Nolon Nations M.D.   On: 10/03/2021 14:09   CT Renal Stone Study  Result Date: 10/03/2021 CLINICAL DATA:  Flank pain, kidney stone suspected. EXAM: CT ABDOMEN AND PELVIS WITHOUT CONTRAST TECHNIQUE: Multidetector CT imaging of the abdomen and pelvis was performed following the standard protocol without IV contrast. COMPARISON:  09/16/2019. FINDINGS: Lower chest: No acute abnormality. Hepatobiliary: No focal liver abnormality is seen. Status post cholecystectomy. No biliary dilatation. Pancreas: Unremarkable. No pancreatic ductal dilatation or surrounding inflammatory changes. Spleen: Normal in size without focal abnormality. Adrenals/Urinary Tract: No adrenal nodule or mass. The left kidney is surgically absent and an irregular fat containing lesion is noted in the surgical bed measuring 2.8 x 1.5 cm, not significantly changed from the prior exam, possible fat necrosis or scarring. There is no renal calculus on the right. There is mild hydroureteronephrosis with a 2 mm calculus in the mid right ureter, axial image 51. The urinary bladder is unremarkable. Stomach/Bowel: No bowel obstruction, free air or pneumatosis. Scattered diverticular present along the colon without evidence of diverticulitis. The appendix is unremarkable. No focal bowel wall thickening. Vascular/Lymphatic: Aortic atherosclerosis. No enlarged abdominal or pelvic lymph nodes. Reproductive: Uterus and bilateral adnexa are unremarkable. Other: No free fluid. Musculoskeletal: Osteopenia is noted. There are  degenerative changes in the thoracolumbar spine. Questionable subtle tiny lytic lesions in the lumbar spine. No acute osseous abnormality is identified. IMPRESSION: 1. Mild to moderate obstructive uropathy on the right with a 2 mm calculus in the mid right ureter. 2. Status post left nephrectomy with fat necrosis/scarring in the surgical bed. 3. Questionable tiny lytic lesions in the lumbar spine, may be associated with osteopenia versus metastatic disease. Attention on follow-up is recommended. Electronically Signed   By: Brett Fairy M.D.   On: 10/03/2021 01:52    ASSESSMENT & PLAN CAI FLOTT 65 y.o. female with medical history significant for multiple myeloma with plasmacytoma who presents for a follow up visit.  #Free Kappa Multiple Myeloma -- original finding of L2 plasmacytoma on biopsy of back lesion, bone marrow biopsy confirms greater than 60% plasma cells consistent with multiple myeloma. --will order monthly SPEP, UPEP, SFLC and beta 2 microglobulin --weekly CBC, CMP, and LDH -- Plan to proceed with VRD chemotherapy as early as 10/28/2021 --Return to clinic for day 1 cycle 1 of VRD chemotherapy   #Supportive Care -- chemotherapy education to be scheduled  -- port placement not required -- zofran 49m q8H PRN and compazine 168mPO q6H for nausea -- acyclovir 40029mO BID for VCZ prophylaxis -- allopurinol 300m32m daily for TLS prophylaxis -- plan to start zometa after dental clearance is obtained. -- EMLA cream for port -- no pain medication required at this time.    No orders of the defined types were placed in this encounter.   All questions were answered. The patient knows to call the clinic with any problems, questions or concerns.  A total of more than 30 minutes were spent on this encounter with face-to-face time and non-face-to-face time, including preparing to see the patient, ordering tests and/or medications, counseling the patient and coordination of care as  outlined above.   JohnLedell Peoples Department of Hematology/Oncology [facility]: [phone]: [phone]: [email or web address]  10/17/2021 8:29 AM

## 2021-10-17 NOTE — Progress Notes (Signed)
New patient enrollment done for this pt's Revlimid. Provided medication information and Revlimid notebook. Enrollment forms signed and fax'd to [facility] and given to oral chemo pharmacy.

## 2021-10-18 LAB — KAPPA/LAMBDA LIGHT CHAINS
Kappa free light chain: 342.1 mg/L — ABNORMAL HIGH (ref 3.3–19.4)
Kappa, lambda light chain ratio: 29.24 — ABNORMAL HIGH (ref 0.26–1.65)
Lambda free light chains: 11.7 mg/L (ref 5.7–26.3)

## 2021-10-18 LAB — BETA 2 MICROGLOBULIN, SERUM: Beta-2 Microglobulin: 3.8 mg/L — ABNORMAL HIGH (ref 0.6–2.4)

## 2021-10-18 MED ORDER — LENALIDOMIDE 25 MG PO CAPS: 25.0000 mg | ORAL_CAPSULE | Freq: Every day | ORAL | 0 refills | Status: DC

## 2021-10-18 NOTE — Telephone Encounter (Signed)
Oral Oncology Pharmacist Encounter   Received notification from [facility] that prior authorization for Revlimid is required.   PA submitted on CoverMyMeds Key: [id number] Status is pending   Oral Oncology Clinic will continue to follow.   Leron Croak, PharmD, BCPS Hematology/Oncology Clinical Pharmacist [facility] [phone] 10/18/2021 7:45 AM

## 2021-10-18 NOTE — Telephone Encounter (Signed)
Oral Oncology Pharmacist Encounter  Prior Authorization for Revlimid has been approved.    PA# [id number] Effective dates: 10/17/21 through 10/17/22  Patient's insurance requires Revlimid be filled through [place]. Prescription redirected for dispensing.   Leron Croak, PharmD, BCPS Hematology/Oncology Clinical Pharmacist [facility] [phone] 10/18/2021 7:59 AM

## 2021-10-19 ENCOUNTER — Encounter: Payer: Self-pay | Admitting: Hematology and Oncology

## 2021-10-19 MED ORDER — ASPIRIN EC 81 MG PO TBEC: 81.0000 mg | DELAYED_RELEASE_TABLET | Freq: Every day | ORAL | 3 refills | Status: DC

## 2021-10-19 MED ORDER — PROCHLORPERAZINE MALEATE 10 MG PO TABS: 10.0000 mg | ORAL_TABLET | Freq: Four times a day (QID) | ORAL | 0 refills | Status: AC | PRN

## 2021-10-19 MED ORDER — ONDANSETRON HCL 8 MG PO TABS: 8.0000 mg | ORAL_TABLET | Freq: Three times a day (TID) | ORAL | 0 refills | Status: AC | PRN

## 2021-10-19 MED ORDER — ACYCLOVIR 400 MG PO TABS: 400.0000 mg | ORAL_TABLET | Freq: Two times a day (BID) | ORAL | 3 refills | Status: DC

## 2021-10-19 NOTE — Progress Notes (Signed)
START ON PATHWAY REGIMEN - Multiple Myeloma and Other Plasma Cell Dyscrasias     A cycle is every 21 days:     Bortezomib      Lenalidomide      Dexamethasone   **Always confirm dose/schedule in your pharmacy ordering system**  Patient Characteristics: Multiple Myeloma, Newly Diagnosed, Transplant Eligible, Unknown or Awaiting Test Results Disease Classification: Multiple Myeloma R-ISS Staging: Unknown Therapeutic Status: Newly Diagnosed Is Patient Eligible for Transplant<= Transplant Eligible Risk Status: Awaiting Test Results Intent of Therapy: Curative Intent, Discussed with Patient 

## 2021-10-21 ENCOUNTER — Encounter (HOSPITAL_COMMUNITY): Payer: Self-pay | Admitting: Hematology and Oncology

## 2021-10-21 LAB — MULTIPLE MYELOMA PANEL, SERUM
Albumin SerPl Elph-Mcnc: 3.7 g/dL (ref 2.9–4.4)
Albumin/Glob SerPl: 1.7 (ref 0.7–1.7)
Alpha 1: 0.2 g/dL (ref 0.0–0.4)
Alpha2 Glob SerPl Elph-Mcnc: 0.7 g/dL (ref 0.4–1.0)
B-Globulin SerPl Elph-Mcnc: 1 g/dL (ref 0.7–1.3)
Gamma Glob SerPl Elph-Mcnc: 0.4 g/dL (ref 0.4–1.8)
Globulin, Total: 2.3 g/dL (ref 2.2–3.9)
IgA: 96 mg/dL (ref 87–352)
IgG (Immunoglobin G), Serum: 536 mg/dL — ABNORMAL LOW (ref 586–1602)
IgM (Immunoglobulin M), Srm: 30 mg/dL (ref 26–217)
Total Protein ELP: 6 g/dL (ref 6.0–8.5)

## 2021-10-21 NOTE — Progress Notes (Signed)
Pharmacist Chemotherapy Monitoring - Initial Assessment    Anticipated start date: 12.2.22   The following has been reviewed per standard work regarding the patient's treatment regimen: The patient's diagnosis, treatment plan and drug doses, and organ/hematologic function Lab orders and baseline tests specific to treatment regimen  The treatment plan start date, drug sequencing, and pre-medications Prior authorization status  Patient's documented medication list, including drug-drug interaction screen and prescriptions for anti-emetics and supportive care specific to the treatment regimen The drug concentrations, fluid compatibility, administration routes, and timing of the medications to be used The patient's access for treatment and lifetime cumulative dose history, if applicable  The patient's medication allergies and previous infusion related reactions, if applicable   Changes made to treatment plan:  N/A  Follow up needed:  Pending authorization for treatment    Carol [name], [place], 10/21/2021  12:56 PM

## 2021-10-23 ENCOUNTER — Encounter: Payer: Self-pay | Admitting: Hematology and Oncology

## 2021-10-23 MED ORDER — ALLOPURINOL 300 MG PO TABS: 300.0000 mg | ORAL_TABLET | Freq: Every day | ORAL | 1 refills | Status: DC

## 2021-10-24 LAB — SURGICAL PATHOLOGY

## 2021-10-24 NOTE — Telephone Encounter (Signed)
Oral Oncology Pharmacist Encounter  Received new prescription for Revlimid (lenalidomide) for the treatment of multiple myeloma in conjunction with Velcade and dexamethasone, planned duration until disease progression or unacceptable drug toxicity.  CBC w/ Diff and CMP from 10/17/21 assessed, noted Scr 1.11 mg/dL (CrCl ~74 mL/min). No baseline dose adjustments required. Prescription dose and frequency assessed for appropriateness.   Current medication list in Epic reviewed, no relevant/significant DDIs with Revlimid identified:  Evaluated chart and no patient barriers to medication adherence noted.   Patient agreement for treatment documented in MD note on 10/17/21.  [facility].   Oral Oncology Clinic will continue to follow for insurance authorization, copayment issues, initial counseling and start date.  Leron Croak, PharmD, BCPS Hematology/Oncology Clinical Pharmacist [facility] [place] [phone] 10/24/2021 8:02 AM

## 2021-10-25 NOTE — Telephone Encounter (Signed)
Oral Chemotherapy Pharmacist Encounter  I spoke with patient for overview of: Revlimid for the treatment of multiple myeloma in conjunction with Velcade and dexamethasone, planned duration until disease progression or unacceptable drug toxicity.  Counseled patient on administration, dosing, side effects, monitoring, drug-food interactions, safe handling, storage, and disposal.  Patient will take Revlimid 72m capsules, 1 capsule by mouth once daily, without regard to food, with a full glass of water.  Revlimid will be given 14 days on, 7 days off, repeat every 21 days.  Revlimid start date: 10/28/21  Adverse effects of Revlimid include but are not limited to: nausea, constipation, diarrhea, abdominal pain, rash, fatigue, and decreased blood counts.    Reviewed with patient importance of keeping a medication schedule and plan for any missed doses. No barriers to medication adherence identified.  Medication reconciliation performed and medication/allergy list updated.  Patient has picked up acyclovir, allopurinol, and anti-emetics. Patient counseled on importance of daily aspirin 845mfor VTE prophylaxis.  Insurance authorization for Revlimid has been obtained.  [facility] is a limited distribution medication. This will be delivered to patient's home today, 10/25/21.  All questions answered.  Ms. CoSkopoiced understanding and appreciation.   Medication education handout and medication calendar will be given to patient in clinic 10/26/21. Patient knows to call the office with questions or concerns. Oral Chemotherapy Clinic phone number provided to patient.   ReLeron CroakPharmD, BCPS Hematology/Oncology Clinical Pharmacist [facility] [id number] [phone]/04/2021 9:21 AM

## 2021-10-26 ENCOUNTER — Other Ambulatory Visit: Payer: Self-pay

## 2021-10-26 ENCOUNTER — Inpatient Hospital Stay: Payer: BC Managed Care – PPO

## 2021-10-26 ENCOUNTER — Inpatient Hospital Stay: Payer: BC Managed Care – PPO | Attending: Radiation Oncology

## 2021-10-26 ENCOUNTER — Encounter: Payer: Self-pay | Admitting: Physician Assistant

## 2021-10-26 ENCOUNTER — Inpatient Hospital Stay (HOSPITAL_BASED_OUTPATIENT_CLINIC_OR_DEPARTMENT_OTHER): Payer: BC Managed Care – PPO | Admitting: Physician Assistant

## 2021-10-26 ENCOUNTER — Other Ambulatory Visit: Payer: Self-pay | Admitting: Physician Assistant

## 2021-10-26 VITALS — BP 124/66 | HR 98 | Temp 97.9°F | Resp 18 | Ht 62.0 in | Wt 204.2 lb

## 2021-10-26 DIAGNOSIS — C9000 Multiple myeloma not having achieved remission: Secondary | ICD-10-CM

## 2021-10-26 DIAGNOSIS — Z79899 Other long term (current) drug therapy: Secondary | ICD-10-CM | POA: Insufficient documentation

## 2021-10-26 DIAGNOSIS — C9030 Solitary plasmacytoma not having achieved remission: Secondary | ICD-10-CM | POA: Diagnosis present

## 2021-10-26 DIAGNOSIS — Z7982 Long term (current) use of aspirin: Secondary | ICD-10-CM | POA: Diagnosis not present

## 2021-10-26 DIAGNOSIS — Z5112 Encounter for antineoplastic immunotherapy: Secondary | ICD-10-CM | POA: Diagnosis present

## 2021-10-26 LAB — CMP (CANCER CENTER ONLY)
ALT: 29 U/L (ref 0–44)
AST: 25 U/L (ref 15–41)
Albumin: 4.4 g/dL (ref 3.5–5.0)
Alkaline Phosphatase: 70 U/L (ref 38–126)
Anion gap: 14 (ref 5–15)
BUN: 16 mg/dL (ref 8–23)
CO2: 22 mmol/L (ref 22–32)
Calcium: 9.6 mg/dL (ref 8.9–10.3)
Chloride: 106 mmol/L (ref 98–111)
Creatinine: 1.06 mg/dL — ABNORMAL HIGH (ref 0.44–1.00)
GFR, Estimated: 58 mL/min — ABNORMAL LOW (ref >60)
Glucose, Bld: 116 mg/dL — ABNORMAL HIGH (ref 70–99)
Potassium: 4 mmol/L (ref 3.5–5.1)
Sodium: 142 mmol/L (ref 135–145)
Total Bilirubin: 1.1 mg/dL (ref 0.3–1.2)
Total Protein: 6.9 g/dL (ref 6.5–8.1)

## 2021-10-26 LAB — CBC WITH DIFFERENTIAL (CANCER CENTER ONLY)
Abs Immature Granulocytes: 0.01 10*3/uL (ref 0.00–0.07)
Basophils Absolute: 0 10*3/uL (ref 0.0–0.1)
Basophils Relative: 1 %
Eosinophils Absolute: 0.1 10*3/uL (ref 0.0–0.5)
Eosinophils Relative: 1 %
HCT: 36.7 % (ref 36.0–46.0)
Hemoglobin: 12.6 g/dL (ref 12.0–15.0)
Immature Granulocytes: 0 %
Lymphocytes Relative: 28 %
Lymphs Abs: 1.2 10*3/uL (ref 0.7–4.0)
MCH: 30 pg (ref 26.0–34.0)
MCHC: 34.3 g/dL (ref 30.0–36.0)
MCV: 87.4 fL (ref 80.0–100.0)
Monocytes Absolute: 0.4 10*3/uL (ref 0.1–1.0)
Monocytes Relative: 9 %
Neutro Abs: 2.5 10*3/uL (ref 1.7–7.7)
Neutrophils Relative %: 61 %
Platelet Count: 188 10*3/uL (ref 150–400)
RBC: 4.2 MIL/uL (ref 3.87–5.11)
RDW: 14.3 % (ref 11.5–15.5)
WBC Count: 4.1 10*3/uL (ref 4.0–10.5)
nRBC: 0 % (ref 0.0–0.2)

## 2021-10-26 LAB — LACTATE DEHYDROGENASE: LDH: 187 U/L (ref 98–192)

## 2021-10-26 NOTE — Progress Notes (Signed)
Met with patient at registration to introduce myself as Financial Resource Specialist and to offer available resources. ° °Discussed one-time $1000 [employer] grant and qualifications to assist with personal expenses while going through treatment. ° °Gave her my card if interested in applying and for any additional financial questions or concerns. °

## 2021-10-26 NOTE — Progress Notes (Signed)
[place] [phone]   [phone]  PROGRESS NOTE  Patient Care Team: [name] as PCP - General (Physician Assistant)  Hematological/Oncological History # Plasmacytoma with Multiple Myeloma  07/29/2021: CT cervical spine shows expansile lytic lesion which almost completely replaces the normal C2 vertebral body  08/24/2021: PET CT scan shows increased metabolic activity at C2 and C7  08/25/2021: biopsy of C2 lesion showed finding consistent with plasma cell neoplasm.  09/08/2021: start radiation therapy 09/12/2021: establish care with Dr. Lorenso Courier  09/28/2021: end radiation therapy 10/10/2021: Bone marrow biopsy. Results confirm plasma cell neoplasm consistent with multiple myeloma, kappa restricted.   Interval History:  Carol [name] 65 y.o. female with medical history significant for multiple myeloma with plasmacytoma who presents for a follow up visit. The patient's last visit was on 10/19/2021. In the interim, she has no changes to her health.   On exam today Carol [name] reports that her energy levels continue to improve.  She is planning on returning to work next week.  She reports having metallic taste which affects her appetite.  She continues to supplement her diet with protein shakes.  Patient denies nausea, vomiting or abdominal pain.  She has occasional episodes of constipation that improves with stool softeners.  She denies easy bruising or signs of bleeding.  Patient denies fevers, chills, night sweats, shortness of breath, chest pain or cough.  She has no other complaints.  Rest of the 10 point ROS is below.  MEDICAL HISTORY:  Past Medical History:  Diagnosis Date   Allergy    Ankle edema    both ankles   Arthritis    hands,knees,elbows   Cancer ([place]) 2016   tumor kidney   Diabetes mellitus    type 2    Eczema    History of kidney stones    Hypothyroidism    Obesity    Sleep apnea    cpap machine setting of 3   Thyroid  disease     SURGICAL HISTORY: Past Surgical History:  Procedure Laterality Date   BREAST BIOPSY Right 2001   CHOLECYSTECTOMY  2009   open   COLONOSCOPY     CYSTOSCOPY W/ RETROGRADES Left 11/22/2018   Procedure: CYSTOSCOPY WITH RETROGRADE PYELOGRAM;  Surgeon: Ardis Hughs, MD;  Location: [facility];  Service: Urology;  Laterality: Left;   CYSTOSCOPY WITH RETROGRADE PYELOGRAM, URETEROSCOPY AND STENT PLACEMENT Right 10/03/2021   Procedure: CYSTOSCOPY WITH RETROGRADE PYELOGRAM, URETEROSCOPY,STONE EXTRACTION AND STENT PLACEMENT;  Surgeon: Ceasar Mons, MD;  Location: [facility];  Service: Urology;  Laterality: Right;   DILATATION & CURRETTAGE/HYSTEROSCOPY WITH RESECTOCOPE N/A 01/21/2013   Procedure: DILATATION & CURETTAGE/HYSTEROSCOPY WITH RESECTOCOPE AND RESECTION OF ENDOMETRIAL;  Surgeon: Selinda Orion, MD;  Location: [facility];  Service: Gynecology;  Laterality: N/A;   KNEE ARTHROSCOPY     left    POLYPECTOMY     POSTERIOR CERVICAL FUSION/FORAMINOTOMY N/A 08/25/2021   Procedure: Occiput to Cervical 5 Posterior cervical instrumented fusion with open biopsy of Cervical 2 Mass. Cervical Two Ganglionectomy;  Surgeon: Judith Part, MD;  Location: [facility];  Service: Neurosurgery;  Laterality: N/A;   ROBOT ASSISTED PYELOPLASTY Left 11/22/2018   Procedure: XI ROBOTIC ASSISTED ATTEMPTED PYELOPLASTY CONVERTED TO NEPHRECTOMY/LYSIS OF ADHESIONS/UMBILICAL HERNIA REPAIR;  Surgeon: Ardis Hughs, MD;  Location: [facility];  Service: Urology;  Laterality: Left;   ROBOTIC ASSITED PARTIAL NEPHRECTOMY Left 06/11/2015   Procedure: LEFT ROBOTIC ASSISTED LAPAROSCOPIC  PARTIAL NEPHRECTOMY;  Surgeon:  Ardis Hughs, MD;  Location: [facility];  Service: Urology;  Laterality: Left;   ROTATOR CUFF REPAIR Left 2011   URETERAL BIOPSY Left 02/14/2017   Procedure: URETERAL BIOPSY;  Surgeon: Ardis Hughs, MD;  Location: [facility];  Service: Urology;  Laterality: Left;   URETEROSCOPY WITH  HOLMIUM LASER LITHOTRIPSY Left 02/14/2017   Procedure: URETEROSCOPY LITHOTRIPSY, URETERAL BALLOON DILATION;  Surgeon: Ardis Hughs, MD;  Location: [facility];  Service: Urology;  Laterality: Left;  With STENT    SOCIAL HISTORY: Social History   Socioeconomic History   Marital status: Married    Spouse name: Not on file   Number of children: Not on file   Years of education: Not on file   Highest education level: Not on file  Occupational History   Not on file  Tobacco Use   Smoking status: Never   Smokeless tobacco: Never  Vaping Use   Vaping Use: Never used  Substance and Sexual Activity   Alcohol use: Yes    Comment: occ   Drug use: No   Sexual activity: Not on file  Other Topics Concern   Not on file  Social History Narrative   Not on file   Social Determinants of Health   Financial Resource Strain: Not on file  Food Insecurity: Not on file  Transportation Needs: Not on file  Physical Activity: Not on file  Stress: Not on file  Social Connections: Not on file  Intimate Partner Violence: Not on file    FAMILY HISTORY: Family History  Problem Relation Age of Onset   Cancer Father     ALLERGIES:  is allergic to morphine.  MEDICATIONS:  Current Outpatient Medications  Medication Sig Dispense Refill   acetaminophen (TYLENOL) 500 MG tablet Take 1,000 mg by mouth every 6 (six) hours as needed for mild pain.     acyclovir (ZOVIRAX) 400 MG tablet Take 1 tablet (400 mg total) by mouth 2 (two) times daily. 60 tablet 3   aspirin EC 81 MG tablet Take 1 tablet (81 mg total) by mouth daily. Swallow whole. 90 tablet 3   atorvastatin (LIPITOR) 20 MG tablet Take 20 mg by mouth at bedtime.     Cholecalciferol (VITAMIN D3) 25 MCG (1000 UT) CAPS Take 1,000 Units by mouth daily.     cyclobenzaprine (FLEXERIL) 10 MG tablet Take 10 mg by mouth 3 (three) times daily as needed for muscle spasms.     furosemide (LASIX) 20 MG tablet Take 20 mg by mouth daily as needed for fluid or  edema.     lenalidomide (REVLIMID) 25 MG capsule Take 1 capsule (25 mg total) by mouth daily. Take for 14 days on, 7 days off. Repeat every 21 days. [employer] Auth # [id number]; Date Obtained 10/17/21 14 capsule 0   levothyroxine (SYNTHROID) 75 MCG tablet Take 75 mcg by mouth daily before breakfast.     metFORMIN (GLUCOPHAGE) 1000 MG tablet Take 1,000 mg by mouth daily with breakfast.     ondansetron (ZOFRAN ODT) 4 MG disintegrating tablet Take 1 tablet (4 mg total) by mouth every 8 (eight) hours as needed for nausea or vomiting. 30 tablet 0   ondansetron (ZOFRAN) 8 MG tablet Take 1 tablet (8 mg total) by mouth every 8 (eight) hours as needed. 30 tablet 0   oxyCODONE (OXY IR/ROXICODONE) 5 MG immediate release tablet Take 1 tablet (5 mg total) by mouth every 4 (four) hours as needed for moderate pain ((score 4 to 6)). 15 tablet  0   OZEMPIC, 0.25 OR 0.5 MG/DOSE, 2 MG/1.5ML SOPN Inject 0.5 mg into the skin every Saturday.     PROAIR HFA 108 (90 BASE) MCG/ACT inhaler Inhale 2 puffs into the lungs every 4 (four) hours as needed for wheezing or shortness of breath. Uses seasonal  3   prochlorperazine (COMPAZINE) 10 MG tablet Take 1 tablet (10 mg total) by mouth every 6 (six) hours as needed for nausea or vomiting. 30 tablet 0   allopurinol (ZYLOPRIM) 300 MG tablet Take 1 tablet (300 mg total) by mouth daily. (Patient not taking: Reported on 10/26/2021) 90 tablet 1   Ascorbic Acid (VITAMIN C ADULT GUMMIES PO) Take 1 tablet by mouth daily. (Patient not taking: Reported on 10/26/2021)     No current facility-administered medications for this visit.    REVIEW OF SYSTEMS:   Constitutional: ( - ) fevers, ( - )  chills , ( - ) night sweats Eyes: ( - ) blurriness of vision, ( - ) double vision, ( - ) watery eyes Ears, nose, mouth, throat, and face: ( - ) mucositis, ( - ) sore throat Respiratory: ( - ) cough, ( - ) dyspnea, ( - ) wheezes Cardiovascular: ( - ) palpitation, ( - ) chest discomfort, ( - ) lower extremity  swelling Gastrointestinal:  ( - ) nausea, ( - ) heartburn, ( - ) change in bowel habits Skin: ( - ) abnormal skin rashes Lymphatics: ( - ) new lymphadenopathy, ( - ) easy bruising Neurological: ( - ) numbness, ( - ) tingling, ( - ) new weaknesses Behavioral/Psych: ( - ) mood change, ( - ) new changes  All other systems were reviewed with the patient and are negative.  PHYSICAL EXAMINATION: ECOG PERFORMANCE STATUS: 1 - Symptomatic but completely ambulatory  Vitals:   10/26/21 1236  BP: 124/66  Pulse: 98  Resp: 18  Temp: 97.9 F (36.6 C)  SpO2: 100%   Filed Weights   10/26/21 1236  Weight: 204 lb 3.2 oz (92.6 kg)    GENERAL: Well-appearing middle-age Caucasian female alert, no distress and comfortable SKIN: skin color, texture, turgor are normal, no rashes or significant lesions EYES: conjunctiva are pink and non-injected, sclera clear LUNGS: clear to auscultation and percussion with normal breathing effort HEART: regular rate & rhythm and no murmurs and no lower extremity edema Musculoskeletal: no cyanosis of digits and no clubbing  PSYCH: alert & oriented x 3, fluent speech NEURO: no focal motor/sensory deficits  LABORATORY DATA:  I have reviewed the data as listed CBC Latest Ref Rng & Units 10/26/2021 10/17/2021 10/10/2021  WBC 4.0 - 10.5 K/uL 4.1 4.4 4.7  Hemoglobin 12.0 - 15.0 g/dL 12.6 12.3 13.6  Hematocrit 36.0 - 46.0 % 36.7 36.2 39.6  Platelets 150 - 400 K/uL 188 199 271    CMP Latest Ref Rng & Units 10/26/2021 10/17/2021 10/03/2021  Glucose 70 - 99 mg/dL 116(H) 143(H) 202(H)  BUN 8 - 23 mg/dL _0 Creatinine 0.44 - 1.00 mg/dL 1.06(H) 1.11(H) 1.16(H)  Sodium 135 - 145 mmol/L 142 142 137  Potassium 3.5 - 5.1 mmol/L 4.0 3.9 3.8  Chloride 98 - 111 mmol/L 106 107 101  CO2 22 - 32 mmol/L _1 Calcium 8.9 - 10.3 mg/dL 9.6 9.4 9.6  Total Protein 6.5 - 8.1 g/dL 6.9 6.8 7.1  Total Bilirubin 0.3 - 1.2 mg/dL 1.1 0.9 1.1  Alkaline Phos 38 - 126 U/L 70 79 83   AST 15 -  41 U/L _0 ALT 0 - 44 U/L _1 Lab Results  Component Value Date   MPROTEIN Not Observed 10/17/2021   MPROTEIN Not Observed 09/12/2021   Lab Results  Component Value Date   KPAFRELGTCHN 342.1 (H) 10/17/2021   KPAFRELGTCHN 280.3 (H) 09/12/2021   LAMBDASER 11.7 10/17/2021   LAMBDASER 14.2 09/12/2021   KAPLAMBRATIO 29.24 (H) 10/17/2021   KAPLAMBRATIO 12.12 09/15/2021   KAPLAMBRATIO 19.74 (H) 09/12/2021    RADIOGRAPHIC STUDIES: CT BIOPSY  Result Date: October 19, 2021 INDICATION: 65 year old female with clinical concern for multiple myeloma. EXAM: CT-GUIDED BONE MARROW BIOPSY AND ASPIRATION MEDICATIONS: None ANESTHESIA/SEDATION: Fentanyl 100 mcg IV; Versed 3 mg IV Sedation Time: 10 minutes; The patient was continuously monitored during the procedure by the interventional radiology nurse under my direct supervision. COMPLICATIONS: None immediate. PROCEDURE: Informed consent was obtained from the patient following an explanation of the procedure, risks, benefits and alternatives. The patient understands, agrees and consents for the procedure. All questions were addressed. A time out was performed prior to the initiation of the procedure. The patient was positioned prone and non-contrast localization CT was performed of the pelvis to demonstrate the iliac marrow spaces. The operative site was prepped and draped in the usual sterile fashion. Under sterile conditions and local anesthesia, a 22 gauge spinal needle was utilized for procedural planning. Next, an 11 gauge coaxial bone biopsy needle was advanced into the right iliac marrow space. Needle position was confirmed with CT imaging. Initially, a bone marrow aspiration was performed. Next, a bone marrow biopsy was obtained with the 11 gauge outer bone marrow device. The 11 gauge coaxial bone biopsy needle was re-advanced into a slightly different location within the right iliac marrow space, positioning was confirmed with CT  imaging and an additional bone marrow biopsy was obtained. Samples were prepared with the cytotechnologist and deemed adequate. The needle was removed and superficial hemostasis was obtained with manual compression. A dressing was applied. The patient tolerated the procedure well without immediate post procedural complication. IMPRESSION: Successful CT guided right iliac bone marrow aspiration and core biopsy. Ruthann Cancer, MD [facility] Radiology Electronically Signed   By: Ruthann Cancer M.D.   On: 2021-10-19 10:11   CT BONE MARROW BIOPSY & ASPIRATION  Result Date: 19-Oct-2021 INDICATION: 65 year old female with clinical concern for multiple myeloma. EXAM: CT-GUIDED BONE MARROW BIOPSY AND ASPIRATION MEDICATIONS: None ANESTHESIA/SEDATION: Fentanyl 100 mcg IV; Versed 3 mg IV Sedation Time: 10 minutes; The patient was continuously monitored during the procedure by the interventional radiology nurse under my direct supervision. COMPLICATIONS: None immediate. PROCEDURE: Informed consent was obtained from the patient following an explanation of the procedure, risks, benefits and alternatives. The patient understands, agrees and consents for the procedure. All questions were addressed. A time out was performed prior to the initiation of the procedure. The patient was positioned prone and non-contrast localization CT was performed of the pelvis to demonstrate the iliac marrow spaces. The operative site was prepped and draped in the usual sterile fashion. Under sterile conditions and local anesthesia, a 22 gauge spinal needle was utilized for procedural planning. Next, an 11 gauge coaxial bone biopsy needle was advanced into the right iliac marrow space. Needle position was confirmed with CT imaging. Initially, a bone marrow aspiration was performed. Next, a bone marrow biopsy was obtained with the 11 gauge outer bone marrow device. The 11 gauge coaxial bone biopsy  needle was re-advanced into a slightly different  location within the right iliac marrow space, positioning was confirmed with CT imaging and an additional bone marrow biopsy was obtained. Samples were prepared with the cytotechnologist and deemed adequate. The needle was removed and superficial hemostasis was obtained with manual compression. A dressing was applied. The patient tolerated the procedure well without immediate post procedural complication. IMPRESSION: Successful CT guided right iliac bone marrow aspiration and core biopsy. Ruthann Cancer, MD [facility] Radiology Electronically Signed   By: Ruthann Cancer M.D.   On: 10/10/2021 10:11   DG C-Arm 1-60 Min  Result Date: 10/03/2021 CLINICAL DATA:  RIGHT-sided stone. EXAM: DG C-ARM 1-60 MIN CONTRAST:  Not given FLUOROSCOPY TIME:  Not given. COMPARISON:  10/03/2021 CT FINDINGS: Contrast fills the RIGHT ureter in a retrograde fashion. There is focal defect in the proximal ureter on earlier images. Delayed images demonstrate a RIGHT double-J ureteral stent. IMPRESSION: Placement of RIGHT ureteral stent. Electronically Signed   By: Nolon Nations M.D.   On: 10/03/2021 14:09   CT Renal Stone Study  Result Date: 10/03/2021 CLINICAL DATA:  Flank pain, kidney stone suspected. EXAM: CT ABDOMEN AND PELVIS WITHOUT CONTRAST TECHNIQUE: Multidetector CT imaging of the abdomen and pelvis was performed following the standard protocol without IV contrast. COMPARISON:  09/16/2019. FINDINGS: Lower chest: No acute abnormality. Hepatobiliary: No focal liver abnormality is seen. Status post cholecystectomy. No biliary dilatation. Pancreas: Unremarkable. No pancreatic ductal dilatation or surrounding inflammatory changes. Spleen: Normal in size without focal abnormality. Adrenals/Urinary Tract: No adrenal nodule or mass. The left kidney is surgically absent and an irregular fat containing lesion is noted in the surgical bed  measuring 2.8 x 1.5 cm, not significantly changed from the prior exam, possible fat necrosis or scarring. There is no renal calculus on the right. There is mild hydroureteronephrosis with a 2 mm calculus in the mid right ureter, axial image 51. The urinary bladder is unremarkable. Stomach/Bowel: No bowel obstruction, free air or pneumatosis. Scattered diverticular present along the colon without evidence of diverticulitis. The appendix is unremarkable. No focal bowel wall thickening. Vascular/Lymphatic: Aortic atherosclerosis. No enlarged abdominal or pelvic lymph nodes. Reproductive: Uterus and bilateral adnexa are unremarkable. Other: No free fluid. Musculoskeletal: Osteopenia is noted. There are degenerative changes in the thoracolumbar spine. Questionable subtle tiny lytic lesions in the lumbar spine. No acute osseous abnormality is identified. IMPRESSION: 1. Mild to moderate obstructive uropathy on the right with a 2 mm calculus in the mid right ureter. 2. Status post left nephrectomy with fat necrosis/scarring in the surgical bed. 3. Questionable tiny lytic lesions in the lumbar spine, may be associated with osteopenia versus metastatic disease. Attention on follow-up is recommended. Electronically Signed   By: Brett Fairy M.D.   On: 10/03/2021 01:52    ASSESSMENT & PLAN Carol [name] 65 y.o. female with medical history significant for multiple myeloma with plasmacytoma who presents for a follow up visit.  #Free Kappa Multiple Myeloma -- original finding of L2 plasmacytoma on biopsy of back lesion, bone marrow biopsy confirms greater than 60% plasma cells consistent with multiple myeloma. --will order monthly SPEP, UPEP, SFLC and beta 2 microglobulin --weekly CBC, CMP, and LDH --Labs today were reviewed and require not intervention. CBC was unremarkable. CMP shows creatinine level has improved to 1.06.  -- Plan to proceed with Cycle 1 Day 1 of VRD chemotherapy on 10/28/2021 --Return to clinic prior  to Cycle 1 Day 15.    #Supportive Care -- chemotherapy education to be scheduled  for later today.  -- port placement not required -- zofran 95m q8H PRN and compazine 128mPO q6H for nausea -- acyclovir 40060mO BID for VCZ prophylaxis -- allopurinol 300m76m daily for TLS prophylaxis -- plan to start zometa after dental clearance is obtained. -- EMLA cream for port -- no pain medication required at this time.   No orders of the defined types were placed in this encounter.   All questions were answered. The patient knows to call the clinic with any problems, questions or concerns.  I have spent a total of 25 minutes minutes of face-to-face and non-face-to-face time, preparing to see the patient, obtaining and/or reviewing separately obtained history, performing a medically appropriate examination, counseling and educating the patient,documenting clinical information in the electronic health record,and care coordination.    IrenLincoln Brigham-C Department of Hematology/Oncology [facility]: [phone]/05/2021 1:28 PM

## 2021-10-27 ENCOUNTER — Other Ambulatory Visit: Payer: Self-pay | Admitting: Hematology and Oncology

## 2021-10-28 ENCOUNTER — Inpatient Hospital Stay: Payer: BC Managed Care – PPO

## 2021-10-28 ENCOUNTER — Other Ambulatory Visit: Payer: Self-pay

## 2021-10-28 VITALS — BP 116/60 | HR 94 | Temp 98.6°F | Resp 18 | Wt 204.2 lb

## 2021-10-28 DIAGNOSIS — C9030 Solitary plasmacytoma not having achieved remission: Secondary | ICD-10-CM | POA: Diagnosis not present

## 2021-10-28 DIAGNOSIS — C9000 Multiple myeloma not having achieved remission: Secondary | ICD-10-CM

## 2021-10-28 MED ORDER — DEXAMETHASONE 4 MG PO TABS
40.0000 mg | ORAL_TABLET | Freq: Once | ORAL | Status: AC
  Administered 2021-10-28: 40 mg via ORAL
  Filled 2021-10-28: qty 10

## 2021-10-28 MED ORDER — BORTEZOMIB CHEMO SQ INJECTION 3.5 MG (2.5MG/ML)
1.3000 mg/m2 | Freq: Once | INTRAMUSCULAR | Status: AC
  Administered 2021-10-28: 2.75 mg via SUBCUTANEOUS
  Filled 2021-10-28: qty 1.1

## 2021-10-28 NOTE — Patient Instructions (Signed)
[facility]  Discharge Instructions: °Thank you for choosing [facility] to provide your oncology and hematology care.  ° °If you have a lab appointment with the Cancer Center, please go directly to the Cancer Center and check in at the registration area. °  °Wear comfortable clothing and clothing appropriate for easy access to any Portacath or PICC line.  ° °We strive to give you quality time with your provider. You may need to reschedule your appointment if you arrive late (15 or more minutes).  Arriving late affects you and other patients whose appointments are after yours.  Also, if you miss three or more appointments without notifying the office, you may be dismissed from the clinic at the provider’s discretion.    °  °For prescription refill requests, have your pharmacy contact our office and allow 72 hours for refills to be completed.   ° °Today you received the following chemotherapy and/or immunotherapy agents: Velcade °  °To help prevent nausea and vomiting after your treatment, we encourage you to take your nausea medication as directed. ° °BELOW ARE SYMPTOMS THAT SHOULD BE REPORTED IMMEDIATELY: °*FEVER GREATER THAN 100.4 F (38 °C) OR HIGHER °*CHILLS OR SWEATING °*NAUSEA AND VOMITING THAT IS NOT CONTROLLED WITH YOUR NAUSEA MEDICATION °*UNUSUAL SHORTNESS OF BREATH °*UNUSUAL BRUISING OR BLEEDING °*URINARY PROBLEMS (pain or burning when urinating, or frequent urination) °*BOWEL PROBLEMS (unusual diarrhea, constipation, pain near the anus) °TENDERNESS IN MOUTH AND THROAT WITH OR WITHOUT PRESENCE OF ULCERS (sore throat, sores in mouth, or a toothache) °UNUSUAL RASH, SWELLING OR PAIN  °UNUSUAL VAGINAL DISCHARGE OR ITCHING  ° °Items with * indicate a potential emergency and should be followed up as soon as possible or go to the Emergency Department if any problems should occur. ° °Please show the CHEMOTHERAPY ALERT CARD or IMMUNOTHERAPY ALERT CARD at check-in to the  Emergency Department and triage nurse. ° °Should you have questions after your visit or need to cancel or reschedule your appointment, please contact [facility]  Dept: [phone]  and follow the prompts.  Office hours are 8:00 a.m. to 4:30 p.m. Monday - Friday. Please note that voicemails left after 4:00 p.m. may not be returned until the following business day.  We are closed weekends and major holidays. You have access to a nurse at all times for urgent questions. Please call the main number to the clinic Dept: [phone] and follow the prompts. ° ° °For any non-urgent questions, you may also contact your provider using MyChart. We now offer e-Visits for anyone 18 and older to request care online for non-urgent symptoms. For details visit [email or web address]. °  °Also download the MyChart app! Go to the app store, search "MyChart", open the app, select [facility], and log in with your MyChart username and password. ° °Due to Covid, a mask is required upon entering the hospital/clinic. If you do not have a mask, one will be given to you upon arrival. For doctor visits, patients may have 1 support person aged 18 or older with them. For treatment visits, patients cannot have anyone with them due to current Covid guidelines and our immunocompromised population.  ° °Bortezomib injection °What is this medication? °BORTEZOMIB (bor TEZ oh mib) targets proteins in cancer cells and stops the cancer cells from growing. It treats multiple myeloma and mantle cell lymphoma. °This medicine may be used for other purposes; ask your health care provider or pharmacist if you have questions. °COMMON BRAND   have questions. COMMON BRAND NAME(S): Velcade What should I tell my care team before I take this medication? They need to know if you have any of these conditions: dehydration diabetes (high blood sugar) heart disease liver disease tingling of the fingers or toes or other nerve disorder an unusual or allergic  reaction to bortezomib, mannitol, boron, other medicines, foods, dyes, or preservatives pregnant or trying to get pregnant breast-feeding How should I use this medication? This medicine is injected into a vein or under the skin. It is given by a health care provider in a hospital or clinic setting. Talk to your health care provider about the use of this medicine in children. Special care may be needed. Overdosage: If you think you have taken too much of this medicine contact a poison control center or emergency room at once. NOTE: This medicine is only for you. Do not share this medicine with others. What if I miss a dose? Keep appointments for follow-up doses. It is important not to miss your dose. Call your health care provider if you are unable to keep an appointment. What may interact with this medication? This medicine may interact with the following medications: ketoconazole rifampin This list may not describe all possible interactions. Give your health care provider a list of all the medicines, herbs, non-prescription drugs, or dietary supplements you use. Also tell them if you smoke, drink alcohol, or use illegal drugs. Some items may interact with your medicine. What should I watch for while using this medication? Your condition will be monitored carefully while you are receiving this medicine. You may need blood work done while you are taking this medicine. You may get drowsy or dizzy. Do not drive, use machinery, or do anything that needs mental alertness until you know how this medicine affects you. Do not stand up or sit up quickly, especially if you are an older patient. This reduces the risk of dizzy or fainting spells This medicine may increase your risk of getting an infection. Call your health care provider for advice if you get a fever, chills, sore throat, or other symptoms of a cold or flu. Do not treat yourself. Try to avoid being around people who are sick. Check with your  health care provider if you have severe diarrhea, nausea, and vomiting, or if you sweat a lot. The loss of too much body fluid may make it dangerous for you to take this medicine. Do not become pregnant while taking this medicine or for 7 months after stopping it. Women should inform their health care provider if they wish to become pregnant or think they might be pregnant. Men should not father a child while taking this medicine and for 4 months after stopping it. There is a potential for serious harm to an unborn child. Talk to your health care provider for more information. Do not breast-feed an infant while taking this medicine or for 2 months after stopping it. This medicine may make it more difficult to get pregnant or father a child. Talk to your health care provider if you are concerned about your fertility. What side effects may I notice from receiving this medication? Side effects that you should report to your doctor or health care professional as soon as possible: allergic reactions (skin rash; itching or hives; swelling of the face, lips, or tongue) bleeding (bloody or black, tarry stools; red or dark brown urine; spitting up blood or brown material that looks like coffee grounds; red spots on the skin;   from the eye, gums, or nose) °blurred vision or changes in vision °confusion °constipation °headache °heart failure (trouble breathing; fast, irregular heartbeat; sudden weight gain; swelling of the ankles, feet, hands) °infection (fever, chills, cough, sore throat, pain or trouble passing urine) °lack or loss of appetite °liver injury (dark yellow or brown urine; general ill feeling or flu-like symptoms; loss of appetite, right upper belly pain; yellowing of the eyes or skin) °low blood pressure (dizziness; feeling faint or lightheaded, falls; unusually weak or tired) °muscle cramps °pain, redness, or irritation at site where injected °pain, tingling, numbness in the  hands or feet °seizures °trouble breathing °unusual bruising or bleeding °Side effects that usually do not require medical attention (report to your doctor or health care professional if they continue or are bothersome): °diarrhea °nausea °stomach pain °trouble sleeping °vomiting °This list may not describe all possible side effects. Call your doctor for medical advice about side effects. You may report side effects to FDA at 1-800-FDA-1088. °Where should I keep my medication? °This medicine is given in a hospital or clinic. It will not be stored at home. °NOTE: This sheet is a summary. It may not cover all possible information. If you have questions about this medicine, talk to your doctor, pharmacist, or health care provider. °© 2022 [employer]vier/Gold Standard (2020-10-28 00:00:00) ° °

## 2021-11-01 ENCOUNTER — Encounter: Payer: Self-pay | Admitting: Urology

## 2021-11-01 ENCOUNTER — Telehealth: Payer: Self-pay

## 2021-11-01 NOTE — Progress Notes (Signed)
Patient reports still having a strong metallic taste in mouth, and a sore throat. No other symptoms reported at this time. Meaningful use complete.  Patient notified of 10:00am-11/02/21 telephone appointment w/ Ashlyn Bruning PA-C and verbalized understanding.  Patient preferred contact # [phone]

## 2021-11-01 NOTE — Telephone Encounter (Signed)
Left message reminding patient of her 10:00am-11/02/21 TELEPHONE  only appointment w/ Ashlyn Bruning PA-C. I asked that patient return my call prior to appointment time so that I may complete the "nursing portion" of the visit before the call w/ Ashlyn. I left my extension [phone].

## 2021-11-02 ENCOUNTER — Ambulatory Visit
Admission: RE | Admit: 2021-11-02 | Discharge: 2021-11-02 | Disposition: A | Discharge status: Home / Self Care | Payer: BC Managed Care – PPO | Source: Ambulatory Visit | Attending: Radiation Oncology | Admitting: Radiation Oncology

## 2021-11-02 DIAGNOSIS — C9000 Multiple myeloma not having achieved remission: Secondary | ICD-10-CM | POA: Insufficient documentation

## 2021-11-02 NOTE — Progress Notes (Signed)
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     Interval Since Last Radiation:  5 weeks  09/15/21 - 09/28/21: The C2 cervical spine inclusive of hardware was treated to 20 Gy in 10 fractions.  Narrative:  I spoke with the patient to conduct her routine scheduled 1 month follow up visit via telephone to spare the patient unnecessary potential exposure in the healthcare setting during the current COVID-19 pandemic.  The patient was notified in advance and gave permission to proceed with this visit format.  She tolerated radiation treatment relatively well with mild sore throat and persistent nausea.                           On review of systems, the patient states that she is doing well in general.  She is no longer having the nausea and vomiting and feels like her energy level continues to improve.  She has been able to go back to work this week.  She does continue with a strong metallic taste in her mouth, particularly when eating warm/hot foods and this is causing significant decreased appetite but she is maintaining her weight, mainly with protein shakes and cool/cold foods.  She has just recently started systemic therapy with VRD on 10/28/2021 and feels like she is tolerating this well.  Overall, she is pleased with her progress to date.  ALLERGIES:  is allergic to morphine.  Meds: Current Outpatient Medications  Medication Sig Dispense Refill   acetaminophen (TYLENOL) 500 MG tablet Take 1,000 mg by mouth every 6 (six) hours as needed for mild pain.     acyclovir (ZOVIRAX) 400 MG tablet Take 1 tablet (400 mg total) by mouth 2 (two) times daily. 60 tablet 3   allopurinol  (ZYLOPRIM) 300 MG tablet Take 1 tablet (300 mg total) by mouth daily. (Patient not taking: Reported on 10/26/2021) 90 tablet 1   Ascorbic Acid (VITAMIN C ADULT GUMMIES PO) Take 1 tablet by mouth daily. (Patient not taking: Reported on 10/26/2021)     aspirin EC 81 MG tablet Take 1 tablet (81 mg total) by mouth daily. Swallow whole. 90 tablet 3   atorvastatin (LIPITOR) 20 MG tablet Take 20 mg by mouth at bedtime.     Cholecalciferol (VITAMIN D3) 25 MCG (1000 UT) CAPS Take 1,000 Units by mouth daily.     cyclobenzaprine (FLEXERIL) 10 MG tablet Take 10 mg by mouth 3 (three) times daily as needed for muscle spasms.     furosemide (LASIX) 20 MG tablet Take 20 mg by mouth daily as needed for fluid or edema.     lenalidomide (REVLIMID) 25 MG capsule Take 1 capsule (25 mg total) by mouth daily. Take for 14 days on, 7 days off. Repeat every 21 days. [employer] Auth # [id number]; Date Obtained 10/17/21 14 capsule 0   levothyroxine (SYNTHROID) 75 MCG tablet Take 75 mcg by mouth daily before breakfast.     metFORMIN (GLUCOPHAGE) 1000 MG tablet Take 1,000 mg by mouth daily with breakfast.     ondansetron (ZOFRAN ODT) 4 MG disintegrating tablet Take 1 tablet (4 mg total) by mouth every 8 (eight) hours  as needed for nausea or vomiting. 30 tablet 0   ondansetron (ZOFRAN) 8 MG tablet Take 1 tablet (8 mg total) by mouth every 8 (eight) hours as needed. 30 tablet 0   oxyCODONE (OXY IR/ROXICODONE) 5 MG immediate release tablet Take 1 tablet (5 mg total) by mouth every 4 (four) hours as needed for moderate pain ((score 4 to 6)). 15 tablet 0   OZEMPIC, 0.25 OR 0.5 MG/DOSE, 2 MG/1.5ML SOPN Inject 0.5 mg into the skin every Saturday.     PROAIR HFA 108 (90 BASE) MCG/ACT inhaler Inhale 2 puffs into the lungs every 4 (four) hours as needed for wheezing or shortness of breath. Uses seasonal  3   prochlorperazine (COMPAZINE) 10 MG tablet Take 1 tablet (10 mg total) by mouth every 6 (six) hours as needed for nausea or vomiting. 30 tablet  0   No current facility-administered medications for this encounter.    Physical Findings:  vitals were not taken for this visit.  Pain Assessment Pain Score: 0-No pain/10 Unable to assess due to telephone follow-up visit format.  Lab Findings: Lab Results  Component Value Date   WBC 4.1 10/26/2021   HGB 12.6 10/26/2021   HCT 36.7 10/26/2021   MCV 87.4 10/26/2021   PLT 188 10/26/2021     Radiographic Findings: CT BIOPSY  Result Date: 10/10/2021 INDICATION: 65 year old female with clinical concern for multiple myeloma. EXAM: CT-GUIDED BONE MARROW BIOPSY AND ASPIRATION MEDICATIONS: None ANESTHESIA/SEDATION: Fentanyl 100 mcg IV; Versed 3 mg IV Sedation Time: 10 minutes; The patient was continuously monitored during the procedure by the interventional radiology nurse under my direct supervision. COMPLICATIONS: None immediate. PROCEDURE: Informed consent was obtained from the patient following an explanation of the procedure, risks, benefits and alternatives. The patient understands, agrees and consents for the procedure. All questions were addressed. A time out was performed prior to the initiation of the procedure. The patient was positioned prone and non-contrast localization CT was performed of the pelvis to demonstrate the iliac marrow spaces. The operative site was prepped and draped in the usual sterile fashion. Under sterile conditions and local anesthesia, a 22 gauge spinal needle was utilized for procedural planning. Next, an 11 gauge coaxial bone biopsy needle was advanced into the right iliac marrow space. Needle position was confirmed with CT imaging. Initially, a bone marrow aspiration was performed. Next, a bone marrow biopsy was obtained with the 11 gauge outer bone marrow device. The 11 gauge coaxial bone biopsy needle was re-advanced into a slightly different location within the right iliac marrow space, positioning was confirmed with CT imaging and an additional bone marrow  biopsy was obtained. Samples were prepared with the cytotechnologist and deemed adequate. The needle was removed and superficial hemostasis was obtained with manual compression. A dressing was applied. The patient tolerated the procedure well without immediate post procedural complication. IMPRESSION: Successful CT guided right iliac bone marrow aspiration and core biopsy. Ruthann Cancer, MD [facility] Radiology Electronically Signed   By: Ruthann Cancer M.D.   On: 10/10/2021 10:11   CT BONE MARROW BIOPSY & ASPIRATION  Result Date: 10/10/2021 INDICATION: 65 year old female with clinical concern for multiple myeloma. EXAM: CT-GUIDED BONE MARROW BIOPSY AND ASPIRATION MEDICATIONS: None ANESTHESIA/SEDATION: Fentanyl 100 mcg IV; Versed 3 mg IV Sedation Time: 10 minutes; The patient was continuously monitored during the procedure by the interventional radiology nurse under my direct supervision. COMPLICATIONS: None immediate. PROCEDURE: Informed consent was obtained from the patient following an explanation of  the procedure, risks, benefits and alternatives. The patient understands, agrees and consents for the procedure. All questions were addressed. A time out was performed prior to the initiation of the procedure. The patient was positioned prone and non-contrast localization CT was performed of the pelvis to demonstrate the iliac marrow spaces. The operative site was prepped and draped in the usual sterile fashion. Under sterile conditions and local anesthesia, a 22 gauge spinal needle was utilized for procedural planning. Next, an 11 gauge coaxial bone biopsy needle was advanced into the right iliac marrow space. Needle position was confirmed with CT imaging. Initially, a bone marrow aspiration was performed. Next, a bone marrow biopsy was obtained with the 11 gauge outer bone marrow device. The 11 gauge coaxial bone biopsy needle was re-advanced into a slightly  different location within the right iliac marrow space, positioning was confirmed with CT imaging and an additional bone marrow biopsy was obtained. Samples were prepared with the cytotechnologist and deemed adequate. The needle was removed and superficial hemostasis was obtained with manual compression. A dressing was applied. The patient tolerated the procedure well without immediate post procedural complication. IMPRESSION: Successful CT guided right iliac bone marrow aspiration and core biopsy. Ruthann Cancer, MD [facility] Radiology Electronically Signed   By: Ruthann Cancer M.D.   On: 10/10/2021 10:11   DG C-Arm 1-60 Min  Result Date: 10/03/2021 CLINICAL DATA:  RIGHT-sided stone. EXAM: DG C-ARM 1-60 MIN CONTRAST:  Not given FLUOROSCOPY TIME:  Not given. COMPARISON:  10/03/2021 CT FINDINGS: Contrast fills the RIGHT ureter in a retrograde fashion. There is focal defect in the proximal ureter on earlier images. Delayed images demonstrate a RIGHT double-J ureteral stent. IMPRESSION: Placement of RIGHT ureteral stent. Electronically Signed   By: Nolon Nations M.D.   On: 10/03/2021 14:09    Impression/Plan: 35. 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    She appears to have recovered well from the effects of her recent postoperative radiation and is currently without complaints aside from the persistent metallic taste in her mouth.  She has just recently started her first cycle of systemic therapy with VRD which she is tolerating well.  We discussed that while we are happy to continue to participate in her care if clinically indicated, at this point, we will plan to see her back on an as-needed basis.  She will continue in routine follow-up under the care and direction of Dr. Lorenso Courier for continued management of her systemic disease.  She knows that she is welcome to call at  anytime with any questions or concerns related to her radiation.  We enjoyed taking care of her and look forward to continuing to follow her progress via correspondence.    Nicholos Johns, PA-C

## 2021-11-02 NOTE — Progress Notes (Signed)
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     Indication for treatment:  Palliation       Radiation treatment dates:   09/15/21 - 09/28/21  Site/dose:   The C2 cervical spine inclusive of hardware was treated to 20 Gy in 10 fractions.  Beams/energy:   A 3D field set-up was employed with 6 MV X-rays  Narrative: The patient tolerated radiation treatment relatively well with mild sore throat and persistent nausea  Plan: The patient has completed radiation treatment. The patient will return to radiation oncology clinic for routine followup in one month. I advised her to call or return sooner if she has any questions or concerns related to her recovery or treatment. ________________________________  Sheral Apley. Tammi Klippel, M.D.

## 2021-11-04 ENCOUNTER — Inpatient Hospital Stay: Payer: BC Managed Care – PPO

## 2021-11-04 ENCOUNTER — Other Ambulatory Visit: Payer: Self-pay

## 2021-11-04 ENCOUNTER — Other Ambulatory Visit: Payer: Self-pay | Admitting: *Deleted

## 2021-11-04 VITALS — BP 111/53 | HR 99 | Temp 98.8°F | Resp 18 | Wt 202.5 lb

## 2021-11-04 DIAGNOSIS — C9000 Multiple myeloma not having achieved remission: Secondary | ICD-10-CM

## 2021-11-04 DIAGNOSIS — C9030 Solitary plasmacytoma not having achieved remission: Secondary | ICD-10-CM | POA: Diagnosis not present

## 2021-11-04 LAB — CBC WITH DIFFERENTIAL (CANCER CENTER ONLY)
Abs Immature Granulocytes: 0.02 10*3/uL (ref 0.00–0.07)
Basophils Absolute: 0 10*3/uL (ref 0.0–0.1)
Basophils Relative: 1 %
Eosinophils Absolute: 0.1 10*3/uL (ref 0.0–0.5)
Eosinophils Relative: 4 %
HCT: 35 % — ABNORMAL LOW (ref 36.0–46.0)
Hemoglobin: 12.3 g/dL (ref 12.0–15.0)
Immature Granulocytes: 1 %
Lymphocytes Relative: 12 %
Lymphs Abs: 0.4 10*3/uL — ABNORMAL LOW (ref 0.7–4.0)
MCH: 30.4 pg (ref 26.0–34.0)
MCHC: 35.1 g/dL (ref 30.0–36.0)
MCV: 86.4 fL (ref 80.0–100.0)
Monocytes Absolute: 0.2 10*3/uL (ref 0.1–1.0)
Monocytes Relative: 7 %
Neutro Abs: 2.6 10*3/uL (ref 1.7–7.7)
Neutrophils Relative %: 75 %
Platelet Count: 100 10*3/uL — ABNORMAL LOW (ref 150–400)
RBC: 4.05 MIL/uL (ref 3.87–5.11)
RDW: 14.3 % (ref 11.5–15.5)
WBC Count: 3.5 10*3/uL — ABNORMAL LOW (ref 4.0–10.5)
nRBC: 0 % (ref 0.0–0.2)

## 2021-11-04 LAB — LACTATE DEHYDROGENASE: LDH: 177 U/L (ref 98–192)

## 2021-11-04 LAB — CMP (CANCER CENTER ONLY)
ALT: 43 U/L (ref 0–44)
AST: 38 U/L (ref 15–41)
Albumin: 4.2 g/dL (ref 3.5–5.0)
Alkaline Phosphatase: 86 U/L (ref 38–126)
Anion gap: 12 (ref 5–15)
BUN: 16 mg/dL (ref 8–23)
CO2: 26 mmol/L (ref 22–32)
Calcium: 9.4 mg/dL (ref 8.9–10.3)
Chloride: 100 mmol/L (ref 98–111)
Creatinine: 1.1 mg/dL — ABNORMAL HIGH (ref 0.44–1.00)
GFR, Estimated: 56 mL/min — ABNORMAL LOW (ref >60)
Glucose, Bld: 127 mg/dL — ABNORMAL HIGH (ref 70–99)
Potassium: 3.7 mmol/L (ref 3.5–5.1)
Sodium: 138 mmol/L (ref 135–145)
Total Bilirubin: 1.4 mg/dL — ABNORMAL HIGH (ref 0.3–1.2)
Total Protein: 6.8 g/dL (ref 6.5–8.1)

## 2021-11-04 MED ORDER — BORTEZOMIB CHEMO SQ INJECTION 3.5 MG (2.5MG/ML)
1.3000 mg/m2 | Freq: Once | INTRAMUSCULAR | Status: AC
  Administered 2021-11-04: 2.75 mg via SUBCUTANEOUS
  Filled 2021-11-04: qty 1.1

## 2021-11-04 MED ORDER — DEXAMETHASONE 4 MG PO TABS
40.0000 mg | ORAL_TABLET | Freq: Once | ORAL | Status: AC
  Administered 2021-11-04: 40 mg via ORAL
  Filled 2021-11-04: qty 10

## 2021-11-04 NOTE — Patient Instructions (Signed)
[facility]  Discharge Instructions: Thank you for choosing [facility] to provide your oncology and hematology care.   If you have a lab appointment with the Cancer Center, please go directly to the Cancer Center and check in at the registration area.   Wear comfortable clothing and clothing appropriate for easy access to any Portacath or PICC line.   We strive to give you quality time with your provider. You may need to reschedule your appointment if you arrive late (15 or more minutes).  Arriving late affects you and other patients whose appointments are after yours.  Also, if you miss three or more appointments without notifying the office, you may be dismissed from the clinic at the provider's discretion.      For prescription refill requests, have your pharmacy contact our office and allow 72 hours for refills to be completed.    Today you received the following chemotherapy and/or immunotherapy agents Velcade       To help prevent nausea and vomiting after your treatment, we encourage you to take your nausea medication as directed.  BELOW ARE SYMPTOMS THAT SHOULD BE REPORTED IMMEDIATELY: *FEVER GREATER THAN 100.4 F (38 C) OR HIGHER *CHILLS OR SWEATING *NAUSEA AND VOMITING THAT IS NOT CONTROLLED WITH YOUR NAUSEA MEDICATION *UNUSUAL SHORTNESS OF BREATH *UNUSUAL BRUISING OR BLEEDING *URINARY PROBLEMS (pain or burning when urinating, or frequent urination) *BOWEL PROBLEMS (unusual diarrhea, constipation, pain near the anus) TENDERNESS IN MOUTH AND THROAT WITH OR WITHOUT PRESENCE OF ULCERS (sore throat, sores in mouth, or a toothache) UNUSUAL RASH, SWELLING OR PAIN  UNUSUAL VAGINAL DISCHARGE OR ITCHING   Items with * indicate a potential emergency and should be followed up as soon as possible or go to the Emergency Department if any problems should occur.  Please show the CHEMOTHERAPY ALERT CARD or IMMUNOTHERAPY ALERT CARD at check-in to  the Emergency Department and triage nurse.  Should you have questions after your visit or need to cancel or reschedule your appointment, please contact [facility]  Dept: [phone]  and follow the prompts.  Office hours are 8:00 a.m. to 4:30 p.m. Monday - Friday. Please note that voicemails left after 4:00 p.m. may not be returned until the following business day.  We are closed weekends and major holidays. You have access to a nurse at all times for urgent questions. Please call the main number to the clinic Dept: [phone] and follow the prompts.   For any non-urgent questions, you may also contact your provider using MyChart. We now offer e-Visits for anyone 18 and older to request care online for non-urgent symptoms. For details visit [email or web address].   Also download the MyChart app! Go to the app store, search "MyChart", open the app, select [facility], and log in with your MyChart username and password.  Due to Covid, a mask is required upon entering the hospital/clinic. If you do not have a mask, one will be given to you upon arrival. For doctor visits, patients may have 1 support person aged 18 or older with them. For treatment visits, patients cannot have anyone with them due to current Covid guidelines and our immunocompromised population.   

## 2021-11-07 ENCOUNTER — Other Ambulatory Visit: Payer: Self-pay | Admitting: Hematology and Oncology

## 2021-11-07 DIAGNOSIS — C9000 Multiple myeloma not having achieved remission: Secondary | ICD-10-CM

## 2021-11-09 ENCOUNTER — Telehealth: Payer: Self-pay | Admitting: *Deleted

## 2021-11-09 ENCOUNTER — Other Ambulatory Visit: Payer: Self-pay | Admitting: *Deleted

## 2021-11-09 DIAGNOSIS — C9000 Multiple myeloma not having achieved remission: Secondary | ICD-10-CM

## 2021-11-09 MED ORDER — REVLIMID 25 MG PO CAPS: ORAL_CAPSULE | ORAL | 0 refills | Status: DC

## 2021-11-09 NOTE — Telephone Encounter (Signed)
Received call from pt. She states she has not had a BM in 2 weeks, essentially since she started her Revlimid and Velcade.  She states that her baseline is having a BM 2x a week.  She has tried Miralax for 3 days and a laxative for 5 days.  Advised to speak with pharmacist about magnesium tablets (Mag Citrate is not available @ this time) and to also pick up sennakot S (store brand) and start them to night . Advised to start with 2-3 tabs , depending on how Magnesium works.  Pt voiced understanding.  Advised to call back with results of the above.  Pt voiced understanding.

## 2021-11-11 ENCOUNTER — Other Ambulatory Visit: Payer: Self-pay

## 2021-11-11 ENCOUNTER — Inpatient Hospital Stay: Payer: BC Managed Care – PPO

## 2021-11-11 ENCOUNTER — Other Ambulatory Visit: Payer: Self-pay | Admitting: Hematology and Oncology

## 2021-11-11 ENCOUNTER — Inpatient Hospital Stay: Payer: BC Managed Care – PPO | Admitting: Hematology and Oncology

## 2021-11-11 VITALS — BP 115/65 | HR 92 | Temp 97.6°F | Resp 17 | Ht 62.0 in | Wt 198.9 lb

## 2021-11-11 DIAGNOSIS — C9000 Multiple myeloma not having achieved remission: Secondary | ICD-10-CM

## 2021-11-11 DIAGNOSIS — C9030 Solitary plasmacytoma not having achieved remission: Secondary | ICD-10-CM | POA: Diagnosis not present

## 2021-11-11 LAB — CMP (CANCER CENTER ONLY)
ALT: 58 U/L — ABNORMAL HIGH (ref 0–44)
AST: 32 U/L (ref 15–41)
Albumin: 4.4 g/dL (ref 3.5–5.0)
Alkaline Phosphatase: 85 U/L (ref 38–126)
Anion gap: 11 (ref 5–15)
BUN: 18 mg/dL (ref 8–23)
CO2: 27 mmol/L (ref 22–32)
Calcium: 9.4 mg/dL (ref 8.9–10.3)
Chloride: 99 mmol/L (ref 98–111)
Creatinine: 1.03 mg/dL — ABNORMAL HIGH (ref 0.44–1.00)
GFR, Estimated: >60 mL/min (ref >60)
Glucose, Bld: 115 mg/dL — ABNORMAL HIGH (ref 70–99)
Potassium: 3.3 mmol/L — ABNORMAL LOW (ref 3.5–5.1)
Sodium: 137 mmol/L (ref 135–145)
Total Bilirubin: 1.7 mg/dL — ABNORMAL HIGH (ref 0.3–1.2)
Total Protein: 6.8 g/dL (ref 6.5–8.1)

## 2021-11-11 LAB — CBC WITH DIFFERENTIAL (CANCER CENTER ONLY)
Abs Immature Granulocytes: 0.02 10*3/uL (ref 0.00–0.07)
Basophils Absolute: 0 10*3/uL (ref 0.0–0.1)
Basophils Relative: 1 %
Eosinophils Absolute: 0.2 10*3/uL (ref 0.0–0.5)
Eosinophils Relative: 5 %
HCT: 36.2 % (ref 36.0–46.0)
Hemoglobin: 12.3 g/dL (ref 12.0–15.0)
Immature Granulocytes: 1 %
Lymphocytes Relative: 11 %
Lymphs Abs: 0.4 10*3/uL — ABNORMAL LOW (ref 0.7–4.0)
MCH: 30.1 pg (ref 26.0–34.0)
MCHC: 34 g/dL (ref 30.0–36.0)
MCV: 88.7 fL (ref 80.0–100.0)
Monocytes Absolute: 0.4 10*3/uL (ref 0.1–1.0)
Monocytes Relative: 10 %
Neutro Abs: 2.9 10*3/uL (ref 1.7–7.7)
Neutrophils Relative %: 72 %
Platelet Count: 95 10*3/uL — ABNORMAL LOW (ref 150–400)
RBC: 4.08 MIL/uL (ref 3.87–5.11)
RDW: 14.6 % (ref 11.5–15.5)
WBC Count: 4 10*3/uL (ref 4.0–10.5)
nRBC: 0 % (ref 0.0–0.2)

## 2021-11-11 LAB — LACTATE DEHYDROGENASE: LDH: 215 U/L — ABNORMAL HIGH (ref 98–192)

## 2021-11-11 MED ORDER — DEXAMETHASONE 4 MG PO TABS
40.0000 mg | ORAL_TABLET | Freq: Once | ORAL | Status: AC
  Administered 2021-11-11: 12:00:00 40 mg via ORAL
  Filled 2021-11-11: qty 10

## 2021-11-11 MED ORDER — BORTEZOMIB CHEMO SQ INJECTION 3.5 MG (2.5MG/ML)
1.3000 mg/m2 | Freq: Once | INTRAMUSCULAR | Status: AC
  Administered 2021-11-11: 13:00:00 2.75 mg via SUBCUTANEOUS
  Filled 2021-11-11: qty 1.1

## 2021-11-11 NOTE — Progress Notes (Signed)
OK to treat with Plaelet s of 95K and Bili of 1.7 per Dr. Lorenso Courier

## 2021-11-11 NOTE — Progress Notes (Signed)
[place] [phone]   [phone]  PROGRESS NOTE  Patient Care Team: [name] as PCP - General (Physician Assistant)  Hematological/Oncological History # Plasmacytoma with Multiple Myeloma  07/29/2021: CT cervical spine shows expansile lytic lesion which almost completely replaces the normal C2 vertebral body  08/24/2021: PET CT scan shows increased metabolic activity at C2 and C7  08/25/2021: biopsy of C2 lesion showed finding consistent with plasma cell neoplasm.  09/08/2021: start radiation therapy 09/12/2021: establish care with Dr. Lorenso Courier  09/28/2021: end radiation therapy 10/10/2021: Bone marrow biopsy. Results confirm plasma cell neoplasm consistent with multiple myeloma, kappa restricted.  10/26/2021: Cycle 1 Day 1 of VRD  Interval History:  Carol [name] 65 y.o. female with medical history significant for multiple myeloma with plasmacytoma who presents for a follow up visit. The patient's last visit was on 10/26/2021. In the interim, she has started VRD chemotherapy.   On exam today Mrs. Hutt reports she is tolerating her treatment well so far.  She notes that she has been struggling with constipation and did not have a reasonable bowel movement for approximately 2 weeks time.  She has discussed this issue with nurse Beth who is provided her with recommendations including Senokot and magnesium powder (magnesium citrate is currently being taken off the shelves".  The patient notes that she is also been struggling with a metallic taste in her mouth due to the radiation therapy and that has been affecting her appetite.  She does enjoy occasional protein shakes and fruit though.  Other than her low energy levels she is doing quite well.  She continues to wear her neck brace.  Patient denies fevers, chills, night sweats, shortness of breath, chest pain or cough.  She has no other complaints.  Rest of the 10 point ROS is below.  MEDICAL  HISTORY:  Past Medical History:  Diagnosis Date   Allergy    Ankle edema    both ankles   Arthritis    hands,knees,elbows   Cancer ([place]) 2016   tumor kidney   Diabetes mellitus    type 2    Eczema    History of kidney stones    Hypothyroidism    Obesity    Sleep apnea    cpap machine setting of 3   Thyroid disease     SURGICAL HISTORY: Past Surgical History:  Procedure Laterality Date   BREAST BIOPSY Right 2001   CHOLECYSTECTOMY  2009   open   COLONOSCOPY     CYSTOSCOPY W/ RETROGRADES Left 11/22/2018   Procedure: CYSTOSCOPY WITH RETROGRADE PYELOGRAM;  Surgeon: Ardis Hughs, MD;  Location: [facility];  Service: Urology;  Laterality: Left;   CYSTOSCOPY WITH RETROGRADE PYELOGRAM, URETEROSCOPY AND STENT PLACEMENT Right 10/03/2021   Procedure: CYSTOSCOPY WITH RETROGRADE PYELOGRAM, URETEROSCOPY,STONE EXTRACTION AND STENT PLACEMENT;  Surgeon: Ceasar Mons, MD;  Location: [facility];  Service: Urology;  Laterality: Right;   DILATATION & CURRETTAGE/HYSTEROSCOPY WITH RESECTOCOPE N/A 01/21/2013   Procedure: DILATATION & CURETTAGE/HYSTEROSCOPY WITH RESECTOCOPE AND RESECTION OF ENDOMETRIAL;  Surgeon: Selinda Orion, MD;  Location: [facility];  Service: Gynecology;  Laterality: N/A;   KNEE ARTHROSCOPY     left    POLYPECTOMY     POSTERIOR CERVICAL FUSION/FORAMINOTOMY N/A 08/25/2021   Procedure: Occiput to Cervical 5 Posterior cervical instrumented fusion with open biopsy of Cervical 2 Mass. Cervical Two Ganglionectomy;  Surgeon: Judith Part, MD;  Location: [facility];  Service: Neurosurgery;  Laterality: N/A;  ROBOT ASSISTED PYELOPLASTY Left 11/22/2018   Procedure: XI ROBOTIC ASSISTED ATTEMPTED PYELOPLASTY CONVERTED TO NEPHRECTOMY/LYSIS OF ADHESIONS/UMBILICAL HERNIA REPAIR;  Surgeon: Ardis Hughs, MD;  Location: [facility];  Service: Urology;  Laterality: Left;   ROBOTIC ASSITED PARTIAL NEPHRECTOMY Left 06/11/2015   Procedure: LEFT ROBOTIC ASSISTED LAPAROSCOPIC   PARTIAL NEPHRECTOMY;  Surgeon: Ardis Hughs, MD;  Location: [facility];  Service: Urology;  Laterality: Left;   ROTATOR CUFF REPAIR Left 2011   URETERAL BIOPSY Left 02/14/2017   Procedure: URETERAL BIOPSY;  Surgeon: Ardis Hughs, MD;  Location: [facility];  Service: Urology;  Laterality: Left;   URETEROSCOPY WITH HOLMIUM LASER LITHOTRIPSY Left 02/14/2017   Procedure: URETEROSCOPY LITHOTRIPSY, URETERAL BALLOON DILATION;  Surgeon: Ardis Hughs, MD;  Location: [facility];  Service: Urology;  Laterality: Left;  With STENT    SOCIAL HISTORY: Social History   Socioeconomic History   Marital status: Married    Spouse name: Not on file   Number of children: Not on file   Years of education: Not on file   Highest education level: Not on file  Occupational History   Not on file  Tobacco Use   Smoking status: Never   Smokeless tobacco: Never  Vaping Use   Vaping Use: Never used  Substance and Sexual Activity   Alcohol use: Yes    Comment: occ   Drug use: No   Sexual activity: Not on file  Other Topics Concern   Not on file  Social History Narrative   Not on file   Social Determinants of Health   Financial Resource Strain: Not on file  Food Insecurity: Not on file  Transportation Needs: Not on file  Physical Activity: Not on file  Stress: Not on file  Social Connections: Not on file  Intimate Partner Violence: Not on file    FAMILY HISTORY: Family History  Problem Relation Age of Onset   Cancer Father     ALLERGIES:  is allergic to morphine.  MEDICATIONS:  Current Outpatient Medications  Medication Sig Dispense Refill   allopurinol (ZYLOPRIM) 300 MG tablet Take 1 tablet (300 mg total) by mouth daily. 90 tablet 1   acetaminophen (TYLENOL) 500 MG tablet Take 1,000 mg by mouth every 6 (six) hours as needed for mild pain.     acyclovir (ZOVIRAX) 400 MG tablet Take 1 tablet (400 mg total) by mouth 2 (two) times daily. 60 tablet 3   Ascorbic Acid (VITAMIN C ADULT GUMMIES  PO) Take 1 tablet by mouth daily. (Patient not taking: Reported on 10/26/2021)     aspirin EC 81 MG tablet Take 1 tablet (81 mg total) by mouth daily. Swallow whole. 90 tablet 3   atorvastatin (LIPITOR) 20 MG tablet Take 20 mg by mouth at bedtime.     Cholecalciferol (VITAMIN D3) 25 MCG (1000 UT) CAPS Take 1,000 Units by mouth daily.     furosemide (LASIX) 20 MG tablet Take 20 mg by mouth daily as needed for fluid or edema.     levothyroxine (SYNTHROID) 75 MCG tablet Take 75 mcg by mouth daily before breakfast.     metFORMIN (GLUCOPHAGE) 1000 MG tablet Take 1,000 mg by mouth daily with breakfast.     ondansetron (ZOFRAN ODT) 4 MG disintegrating tablet Take 1 tablet (4 mg total) by mouth every 8 (eight) hours as needed for nausea or vomiting. 30 tablet 0   ondansetron (ZOFRAN) 8 MG tablet Take 1 tablet (8 mg total) by mouth every 8 (eight) hours as  needed. 30 tablet 0   OZEMPIC, 0.25 OR 0.5 MG/DOSE, 2 MG/1.5ML SOPN Inject 0.5 mg into the skin every Saturday.     PROAIR HFA 108 (90 BASE) MCG/ACT inhaler Inhale 2 puffs into the lungs every 4 (four) hours as needed for wheezing or shortness of breath. Uses seasonal  3   prochlorperazine (COMPAZINE) 10 MG tablet Take 1 tablet (10 mg total) by mouth every 6 (six) hours as needed for nausea or vomiting. 30 tablet 0   REVLIMID 25 MG capsule TAKE 1 CAPSULE BY MOUTH ONCE DAILY FOR 14 DAYS ON AND 7 DAYS OFF Auth # [id number]; Date obtained : 11/09/21 14 capsule 0   No current facility-administered medications for this visit.    REVIEW OF SYSTEMS:   Constitutional: ( - ) fevers, ( - )  chills , ( - ) night sweats Eyes: ( - ) blurriness of vision, ( - ) double vision, ( - ) watery eyes Ears, nose, mouth, throat, and face: ( - ) mucositis, ( - ) sore throat Respiratory: ( - ) cough, ( - ) dyspnea, ( - ) wheezes Cardiovascular: ( - ) palpitation, ( - ) chest discomfort, ( - ) lower extremity swelling Gastrointestinal:  ( - ) nausea, ( - ) heartburn, ( - )  change in bowel habits Skin: ( - ) abnormal skin rashes Lymphatics: ( - ) new lymphadenopathy, ( - ) easy bruising Neurological: ( - ) numbness, ( - ) tingling, ( - ) new weaknesses Behavioral/Psych: ( - ) mood change, ( - ) new changes  All other systems were reviewed with the patient and are negative.  PHYSICAL EXAMINATION: ECOG PERFORMANCE STATUS: 1 - Symptomatic but completely ambulatory  Vitals:   11/11/21 1111  BP: 115/65  Pulse: 92  Resp: 17  Temp: 97.6 F (36.4 C)  SpO2: 100%   Filed Weights   11/11/21 1111  Weight: 198 lb 14.4 oz (90.2 kg)    GENERAL: Well-appearing middle-age Caucasian female alert, no distress and comfortable SKIN: skin color, texture, turgor are normal, no rashes or significant lesions EYES: conjunctiva are pink and non-injected, sclera clear LUNGS: clear to auscultation and percussion with normal breathing effort HEART: regular rate & rhythm and no murmurs and no lower extremity edema Musculoskeletal: no cyanosis of digits and no clubbing  PSYCH: alert & oriented x 3, fluent speech NEURO: no focal motor/sensory deficits  LABORATORY DATA:  I have reviewed the data as listed CBC Latest Ref Rng & Units 11/11/2021 11/04/2021 10/26/2021  WBC 4.0 - 10.5 K/uL 4.0 3.5(L) 4.1  Hemoglobin 12.0 - 15.0 g/dL 12.3 12.3 12.6  Hematocrit 36.0 - 46.0 % 36.2 35.0(L) 36.7  Platelets 150 - 400 K/uL 95(L) 100(L) 188    CMP Latest Ref Rng & Units 11/11/2021 11/04/2021 10/26/2021  Glucose 70 - 99 mg/dL 115(H) 127(H) 116(H)  BUN 8 - 23 mg/dL _0 Creatinine 0.44 - 1.00 mg/dL 1.03(H) 1.10(H) 1.06(H)  Sodium 135 - 145 mmol/L 137 138 142  Potassium 3.5 - 5.1 mmol/L 3.3(L) 3.7 4.0  Chloride 98 - 111 mmol/L 99 100 106  CO2 22 - 32 mmol/L _1 Calcium 8.9 - 10.3 mg/dL 9.4 9.4 9.6  Total Protein 6.5 - 8.1 g/dL 6.8 6.8 6.9  Total Bilirubin 0.3 - 1.2 mg/dL 1.7(H) 1.4(H) 1.1  Alkaline Phos 38 - 126 U/L 85 86 70  AST 15 - 41 U/L 32 38 25  ALT 0 - 44 U/L 58(H)  43 29  Lab Results  Component Value Date   MPROTEIN Not Observed 10/17/2021   MPROTEIN Not Observed 09/12/2021   Lab Results  Component Value Date   KPAFRELGTCHN 342.1 (H) 10/17/2021   KPAFRELGTCHN 280.3 (H) 09/12/2021   LAMBDASER 11.7 10/17/2021   LAMBDASER 14.2 09/12/2021   KAPLAMBRATIO 29.24 (H) 10/17/2021   KAPLAMBRATIO 12.12 09/15/2021   KAPLAMBRATIO 19.74 (H) 09/12/2021    RADIOGRAPHIC STUDIES: No results found.  ASSESSMENT & PLAN Carol [name] 65 y.o. female with medical history significant for multiple myeloma with plasmacytoma who presents for a follow up visit.  #Free Kappa Multiple Myeloma -- original finding of L2 plasmacytoma on biopsy of back lesion, bone marrow biopsy confirms greater than 60% plasma cells consistent with multiple myeloma. --Cycle 1 Day 1 of treatment started on 10/26/2021  Plan:  --will order monthly SPEP, UPEP, SFLC and beta 2 microglobulin --weekly CBC, CMP, and LDH --Return to clinic prior to Cycle 2 Day 1.   #Supportive Care -- chemotherapy education complete  -- port placement not required -- zofran 36m q8H PRN and compazine 112mPO q6H for nausea -- acyclovir 40083mO BID for VCZ prophylaxis -- allopurinol 300m20m daily for TLS prophylaxis -- plan to start zometa after dental clearance is obtained. -- EMLA cream for port -- no pain medication required at this time.   No orders of the defined types were placed in this encounter.    All questions were answered. The patient knows to call the clinic with any problems, questions or concerns.  I have spent a total of 30 minutes minutes of face-to-face and non-face-to-face time, preparing to see the patient, obtaining and/or reviewing separately obtained history, performing a medically appropriate examination, counseling and educating the patient,documenting clinical information in the electronic health record,and care coordination.   JohnLedell Peoples Department of  Hematology/Oncology [facility]: [phone]: [phone]: [email or web address]   11/11/2021 1:17 PM

## 2021-11-15 LAB — KAPPA/LAMBDA LIGHT CHAINS
Kappa free light chain: 120.7 mg/L — ABNORMAL HIGH (ref 3.3–19.4)
Kappa, lambda light chain ratio: 6.86 — ABNORMAL HIGH (ref 0.26–1.65)
Lambda free light chains: 17.6 mg/L (ref 5.7–26.3)

## 2021-11-18 ENCOUNTER — Inpatient Hospital Stay: Payer: BC Managed Care – PPO

## 2021-11-18 ENCOUNTER — Other Ambulatory Visit: Payer: Self-pay

## 2021-11-18 VITALS — BP 113/59 | HR 83 | Temp 98.4°F | Resp 18

## 2021-11-18 DIAGNOSIS — C9000 Multiple myeloma not having achieved remission: Secondary | ICD-10-CM

## 2021-11-18 DIAGNOSIS — C9030 Solitary plasmacytoma not having achieved remission: Secondary | ICD-10-CM | POA: Diagnosis not present

## 2021-11-18 LAB — CMP (CANCER CENTER ONLY)
ALT: 45 U/L — ABNORMAL HIGH (ref 0–44)
AST: 24 U/L (ref 15–41)
Albumin: 4.1 g/dL (ref 3.5–5.0)
Alkaline Phosphatase: 78 U/L (ref 38–126)
Anion gap: 8 (ref 5–15)
BUN: 15 mg/dL (ref 8–23)
CO2: 26 mmol/L (ref 22–32)
Calcium: 9.2 mg/dL (ref 8.9–10.3)
Chloride: 106 mmol/L (ref 98–111)
Creatinine: 1 mg/dL (ref 0.44–1.00)
GFR, Estimated: >60 mL/min (ref >60)
Glucose, Bld: 131 mg/dL — ABNORMAL HIGH (ref 70–99)
Potassium: 3.9 mmol/L (ref 3.5–5.1)
Sodium: 140 mmol/L (ref 135–145)
Total Bilirubin: 0.9 mg/dL (ref 0.3–1.2)
Total Protein: 6.5 g/dL (ref 6.5–8.1)

## 2021-11-18 LAB — CBC WITH DIFFERENTIAL (CANCER CENTER ONLY)
Abs Immature Granulocytes: 0.02 10*3/uL (ref 0.00–0.07)
Basophils Absolute: 0.2 10*3/uL — ABNORMAL HIGH (ref 0.0–0.1)
Basophils Relative: 4 %
Eosinophils Absolute: 0.1 10*3/uL (ref 0.0–0.5)
Eosinophils Relative: 2 %
HCT: 35.7 % — ABNORMAL LOW (ref 36.0–46.0)
Hemoglobin: 12 g/dL (ref 12.0–15.0)
Immature Granulocytes: 1 %
Lymphocytes Relative: 19 %
Lymphs Abs: 0.7 10*3/uL (ref 0.7–4.0)
MCH: 30.8 pg (ref 26.0–34.0)
MCHC: 33.6 g/dL (ref 30.0–36.0)
MCV: 91.5 fL (ref 80.0–100.0)
Monocytes Absolute: 0.6 10*3/uL (ref 0.1–1.0)
Monocytes Relative: 16 %
Neutro Abs: 2.2 10*3/uL (ref 1.7–7.7)
Neutrophils Relative %: 58 %
Platelet Count: 163 10*3/uL (ref 150–400)
RBC: 3.9 MIL/uL (ref 3.87–5.11)
RDW: 15.9 % — ABNORMAL HIGH (ref 11.5–15.5)
WBC Count: 3.8 10*3/uL — ABNORMAL LOW (ref 4.0–10.5)
nRBC: 0 % (ref 0.0–0.2)

## 2021-11-18 LAB — MULTIPLE MYELOMA PANEL, SERUM
Albumin SerPl Elph-Mcnc: 3.2 g/dL (ref 2.9–4.4)
Albumin/Glob SerPl: 1.8 — ABNORMAL HIGH (ref 0.7–1.7)
Alpha 1: 0.2 g/dL (ref 0.0–0.4)
Alpha2 Glob SerPl Elph-Mcnc: 0.5 g/dL (ref 0.4–1.0)
B-Globulin SerPl Elph-Mcnc: 0.7 g/dL (ref 0.7–1.3)
Gamma Glob SerPl Elph-Mcnc: 0.3 g/dL — ABNORMAL LOW (ref 0.4–1.8)
Globulin, Total: 1.8 g/dL — ABNORMAL LOW (ref 2.2–3.9)
IgA: 75 mg/dL — ABNORMAL LOW (ref 87–352)
IgG (Immunoglobin G), Serum: 500 mg/dL — ABNORMAL LOW (ref 586–1602)
IgM (Immunoglobulin M), Srm: 35 mg/dL (ref 26–217)
Total Protein ELP: 5 g/dL — ABNORMAL LOW (ref 6.0–8.5)

## 2021-11-18 LAB — LACTATE DEHYDROGENASE: LDH: 192 U/L (ref 98–192)

## 2021-11-18 MED ORDER — DEXAMETHASONE 4 MG PO TABS
40.0000 mg | ORAL_TABLET | Freq: Once | ORAL | Status: AC
  Administered 2021-11-18: 11:00:00 40 mg via ORAL
  Filled 2021-11-18: qty 10

## 2021-11-18 MED ORDER — BORTEZOMIB CHEMO SQ INJECTION 3.5 MG (2.5MG/ML)
1.3000 mg/m2 | Freq: Once | INTRAMUSCULAR | Status: AC
  Administered 2021-11-18: 12:00:00 2.75 mg via SUBCUTANEOUS
  Filled 2021-11-18: qty 1.1

## 2021-11-18 NOTE — Patient Instructions (Signed)
[facility] ONCOLOGY  Discharge Instructions: Thank you for choosing [facility] to provide your oncology and hematology care.   If you have a lab appointment with the [facility], please go directly to the [facility] and check in at the registration area.   Wear comfortable clothing and clothing appropriate for easy access to any Portacath or PICC line.   We strive to give you quality time with your provider. You may need to reschedule your appointment if you arrive late (15 or more minutes).  Arriving late affects you and other patients whose appointments are after yours.  Also, if you miss three or more appointments without notifying the office, you may be dismissed from the clinic at the providers discretion.      For prescription refill requests, have your pharmacy contact our office and allow 72 hours for refills to be completed.    Today you received the following chemotherapy and/or immunotherapy agents Velcade    and dexamethasone.   To help prevent nausea and vomiting after your treatment, we encourage you to take your nausea medication as directed.  BELOW ARE SYMPTOMS THAT SHOULD BE REPORTED IMMEDIATELY: *FEVER GREATER THAN 100.4 F (38 C) OR HIGHER *CHILLS OR SWEATING *NAUSEA AND VOMITING THAT IS NOT CONTROLLED WITH YOUR NAUSEA MEDICATION *UNUSUAL SHORTNESS OF BREATH *UNUSUAL BRUISING OR BLEEDING *URINARY PROBLEMS (pain or burning when urinating, or frequent urination) *BOWEL PROBLEMS (unusual diarrhea, constipation, pain near the anus) TENDERNESS IN MOUTH AND THROAT WITH OR WITHOUT PRESENCE OF ULCERS (sore throat, sores in mouth, or a toothache) UNUSUAL RASH, SWELLING OR PAIN  UNUSUAL VAGINAL DISCHARGE OR ITCHING   Items with * indicate a potential emergency and should be followed up as soon as possible or go to the Emergency Department if any problems should occur.  Please show the CHEMOTHERAPY ALERT CARD or IMMUNOTHERAPY ALERT CARD  at check-in to the Emergency Department and triage nurse.  Should you have questions after your visit or need to cancel or reschedule your appointment, please contact [facility]  Dept: [phone]  and follow the prompts.  Office hours are 8:00 a.m. to 4:30 p.m. Monday - Friday. Please note that voicemails left after 4:00 p.m. may not be returned until the following business day.  We are closed weekends and major holidays. You have access to a nurse at all times for urgent questions. Please call the main number to the clinic Dept: [phone] and follow the prompts.   For any non-urgent questions, you may also contact your provider using MyChart. We now offer e-Visits for anyone 28 and older to request care online for non-urgent symptoms. For details visit [email or web address].   Also download the MyChart app! Go to the app store, search "MyChart", open the app, select [facility], and log in with your MyChart username and password.  Due to Covid, a mask is required upon entering the hospital/clinic. If you do not have a mask, one will be given to you upon arrival. For doctor visits, patients may have 1 support person aged 66 or older with them. For treatment visits, patients cannot have anyone with them due to current Covid guidelines and our immunocompromised population.

## 2021-11-22 ENCOUNTER — Telehealth: Payer: Self-pay | Admitting: *Deleted

## 2021-11-22 NOTE — Telephone Encounter (Signed)
Pt called stating she had infusion Friday and has had a break out on face, neck, and has itching. Returned pt called. Vm was left for pt to call back and see if other measures such as benadryl has been tried. Pt has not called back. Attempted a second time but no response.

## 2021-11-24 ENCOUNTER — Other Ambulatory Visit: Payer: Self-pay | Admitting: *Deleted

## 2021-11-24 ENCOUNTER — Inpatient Hospital Stay: Payer: BC Managed Care – PPO

## 2021-11-24 ENCOUNTER — Telehealth: Payer: Self-pay | Admitting: *Deleted

## 2021-11-24 ENCOUNTER — Encounter: Payer: Self-pay | Admitting: Hematology and Oncology

## 2021-11-24 ENCOUNTER — Other Ambulatory Visit: Payer: Self-pay

## 2021-11-24 ENCOUNTER — Other Ambulatory Visit: Payer: Self-pay | Admitting: Hematology and Oncology

## 2021-11-24 ENCOUNTER — Inpatient Hospital Stay: Payer: BC Managed Care – PPO | Attending: Radiation Oncology | Admitting: Physician Assistant

## 2021-11-24 VITALS — BP 134/68 | HR 85 | Temp 98.7°F | Resp 18 | Wt 195.6 lb

## 2021-11-24 DIAGNOSIS — R638 Other symptoms and signs concerning food and fluid intake: Secondary | ICD-10-CM | POA: Diagnosis not present

## 2021-11-24 DIAGNOSIS — C9030 Solitary plasmacytoma not having achieved remission: Secondary | ICD-10-CM | POA: Diagnosis present

## 2021-11-24 DIAGNOSIS — R21 Rash and other nonspecific skin eruption: Secondary | ICD-10-CM | POA: Diagnosis not present

## 2021-11-24 DIAGNOSIS — C9000 Multiple myeloma not having achieved remission: Secondary | ICD-10-CM

## 2021-11-24 DIAGNOSIS — Z5112 Encounter for antineoplastic immunotherapy: Secondary | ICD-10-CM | POA: Diagnosis present

## 2021-11-24 DIAGNOSIS — Z79899 Other long term (current) drug therapy: Secondary | ICD-10-CM | POA: Insufficient documentation

## 2021-11-24 MED ORDER — NYSTATIN 100000 UNIT/ML MT SUSP: 5.0000 mL | Freq: Four times a day (QID) | OROMUCOSAL | 0 refills | Status: DC

## 2021-11-24 MED ORDER — SODIUM CHLORIDE 0.9 % IV SOLN: Freq: Once | INTRAVENOUS | Status: AC

## 2021-11-24 MED ORDER — METHYLPREDNISOLONE SODIUM SUCC 125 MG IJ SOLR
125.0000 mg | Freq: Once | INTRAMUSCULAR | Status: AC
  Administered 2021-11-24: 125 mg via INTRAVENOUS
  Filled 2021-11-24: qty 2

## 2021-11-24 MED ORDER — REVLIMID 25 MG PO CAPS: ORAL_CAPSULE | ORAL | 0 refills | Status: DC

## 2021-11-24 MED ORDER — METHYLPREDNISOLONE 4 MG PO TBPK: ORAL_TABLET | ORAL | 0 refills | Status: DC

## 2021-11-24 NOTE — Progress Notes (Addendum)
Symptom Management Consult note [place]    Patient Care Team: [name] as PCP - General (Physician Assistant)    Name of the patient: Carol [name]  [id number]  10/12/1956   Date of visit: 11/24/2021    Chief complaint/ Reason for visit- rash  Oncology History  Multiple myeloma without remission ([place])  08/16/2021 Initial Diagnosis   Multiple myeloma without remission ([place])   10/28/2021 -  Chemotherapy   Patient is on Treatment Plan : MYELOMA  RVD SQ q21d x 4 cycles       Current Therapy: Day 1 cycle 2 bortezomib on 11/18/21  Interval history- Sorrel Cassetta. Olinde is a 66 yo female with oncologic history as listed above presenting to [facility] today with chief complaint of rash x 6 days. Patient states her last treatment was Friday and when she woke up on Saturday she noticed redness and rash on her face. She states her scalp started to itch. Rash spread to her chest, arms ,and back.  She had an appointment already scheduled with pcp on 11/22/21. PCP prescribed hydroxyzine and tacrolimus which she has been taking without symptom improvement. She took benadryl last night and this morning with pruritus improvement.  At first she thought the redness on her face was caused by wearing a new mask so she switched back to the original ones she had been using. Symptoms did not improve. She also reports mouth pain and white patches on her tongue. She noticed that approximately x 2 days ago. She has not tried any OTC medications for mouth pain prior to arrival. She states she has had decreased PO intake while undergoing therapy as food tastes bad and she frequently has a metallic taste in her mouth. She tried to stay hydrated by drinking water. She denies any new soaps, lotions, or laundry detergents. She denies fever, chills, chest pain, shortness of breath, wheezing, abdominal pain, nausea, emesis.     ROS  All other systems are reviewed and are negative for acute change  except as noted in the HPI.    Allergies  Allergen Reactions   Morphine Nausea And Vomiting     Past Medical History:  Diagnosis Date   Allergy    Ankle edema    both ankles   Arthritis    hands,knees,elbows   Cancer ([place]) 2016   tumor kidney   Diabetes mellitus    type 2    Eczema    History of kidney stones    Hypothyroidism    Obesity    Sleep apnea    cpap machine setting of 3   Thyroid disease      Past Surgical History:  Procedure Laterality Date   BREAST BIOPSY Right 2001   CHOLECYSTECTOMY  2009   open   COLONOSCOPY     CYSTOSCOPY W/ RETROGRADES Left 11/22/2018   Procedure: CYSTOSCOPY WITH RETROGRADE PYELOGRAM;  Surgeon: Ardis Hughs, MD;  Location: [facility];  Service: Urology;  Laterality: Left;   CYSTOSCOPY WITH RETROGRADE PYELOGRAM, URETEROSCOPY AND STENT PLACEMENT Right 10/03/2021   Procedure: CYSTOSCOPY WITH RETROGRADE PYELOGRAM, URETEROSCOPY,STONE EXTRACTION AND STENT PLACEMENT;  Surgeon: Ceasar Mons, MD;  Location: [facility];  Service: Urology;  Laterality: Right;   DILATATION & CURRETTAGE/HYSTEROSCOPY WITH RESECTOCOPE N/A 01/21/2013   Procedure: DILATATION & CURETTAGE/HYSTEROSCOPY WITH RESECTOCOPE AND RESECTION OF ENDOMETRIAL;  Surgeon: Selinda Orion, MD;  Location: [facility];  Service: Gynecology;  Laterality: N/A;   KNEE ARTHROSCOPY  left    POLYPECTOMY     POSTERIOR CERVICAL FUSION/FORAMINOTOMY N/A 08/25/2021   Procedure: Occiput to Cervical 5 Posterior cervical instrumented fusion with open biopsy of Cervical 2 Mass. Cervical Two Ganglionectomy;  Surgeon: Judith Part, MD;  Location: [facility];  Service: Neurosurgery;  Laterality: N/A;   ROBOT ASSISTED PYELOPLASTY Left 11/22/2018   Procedure: XI ROBOTIC ASSISTED ATTEMPTED PYELOPLASTY CONVERTED TO NEPHRECTOMY/LYSIS OF ADHESIONS/UMBILICAL HERNIA REPAIR;  Surgeon: Ardis Hughs, MD;  Location: [facility];  Service: Urology;  Laterality: Left;   ROBOTIC ASSITED  PARTIAL NEPHRECTOMY Left 06/11/2015   Procedure: LEFT ROBOTIC ASSISTED LAPAROSCOPIC  PARTIAL NEPHRECTOMY;  Surgeon: Ardis Hughs, MD;  Location: [facility];  Service: Urology;  Laterality: Left;   ROTATOR CUFF REPAIR Left 2011   URETERAL BIOPSY Left 02/14/2017   Procedure: URETERAL BIOPSY;  Surgeon: Ardis Hughs, MD;  Location: [facility];  Service: Urology;  Laterality: Left;   URETEROSCOPY WITH HOLMIUM LASER LITHOTRIPSY Left 02/14/2017   Procedure: URETEROSCOPY LITHOTRIPSY, URETERAL BALLOON DILATION;  Surgeon: Ardis Hughs, MD;  Location: [facility];  Service: Urology;  Laterality: Left;  With STENT    Social History   Socioeconomic History   Marital status: Married    Spouse name: Not on file   Number of children: Not on file   Years of education: Not on file   Highest education level: Not on file  Occupational History   Not on file  Tobacco Use   Smoking status: Never   Smokeless tobacco: Never  Vaping Use   Vaping Use: Never used  Substance and Sexual Activity   Alcohol use: Yes    Comment: occ   Drug use: No   Sexual activity: Not on file  Other Topics Concern   Not on file  Social History Narrative   Not on file   Social Determinants of Health   Financial Resource Strain: Not on file  Food Insecurity: Not on file  Transportation Needs: Not on file  Physical Activity: Not on file  Stress: Not on file  Social Connections: Not on file  Intimate Partner Violence: Not on file    Family History  Problem Relation Age of Onset   Cancer Father      Current Outpatient Medications:    methylPREDNISolone (MEDROL DOSEPAK) 4 MG TBPK tablet, Take 6 pills by mouth day 1, 5 on day 2, 4 on day 3, 3 on day 4, 2 on day 5, 1 on day 6, Disp: 21 tablet, Rfl: 0   nystatin (MYCOSTATIN) 100000 UNIT/ML suspension, Take 5 mLs (500,000 Units total) by mouth 4 (four) times daily., Disp: 60 mL, Rfl: 0   acetaminophen (TYLENOL) 500 MG tablet, Take 1,000 mg by mouth every 6 (six) hours  as needed for mild pain., Disp: , Rfl:    acyclovir (ZOVIRAX) 400 MG tablet, Take 1 tablet (400 mg total) by mouth 2 (two) times daily., Disp: 60 tablet, Rfl: 3   allopurinol (ZYLOPRIM) 300 MG tablet, Take 1 tablet (300 mg total) by mouth daily., Disp: 90 tablet, Rfl: 1   Ascorbic Acid (VITAMIN C ADULT GUMMIES PO), Take 1 tablet by mouth daily. (Patient not taking: Reported on 10/26/2021), Disp: , Rfl:    aspirin EC 81 MG tablet, Take 1 tablet (81 mg total) by mouth daily. Swallow whole., Disp: 90 tablet, Rfl: 3   atorvastatin (LIPITOR) 20 MG tablet, Take 20 mg by mouth at bedtime., Disp: , Rfl:    Cholecalciferol (VITAMIN D3) 25 MCG (1000 UT)  CAPS, Take 1,000 Units by mouth daily., Disp: , Rfl:    furosemide (LASIX) 20 MG tablet, Take 20 mg by mouth daily as needed for fluid or edema., Disp: , Rfl:    levothyroxine (SYNTHROID) 75 MCG tablet, Take 75 mcg by mouth daily before breakfast., Disp: , Rfl:    metFORMIN (GLUCOPHAGE) 1000 MG tablet, Take 1,000 mg by mouth daily with breakfast., Disp: , Rfl:    ondansetron (ZOFRAN ODT) 4 MG disintegrating tablet, Take 1 tablet (4 mg total) by mouth every 8 (eight) hours as needed for nausea or vomiting., Disp: 30 tablet, Rfl: 0   ondansetron (ZOFRAN) 8 MG tablet, Take 1 tablet (8 mg total) by mouth every 8 (eight) hours as needed., Disp: 30 tablet, Rfl: 0   OZEMPIC, 0.25 OR 0.5 MG/DOSE, 2 MG/1.5ML SOPN, Inject 0.5 mg into the skin every Saturday., Disp: , Rfl:    PROAIR HFA 108 (90 BASE) MCG/ACT inhaler, Inhale 2 puffs into the lungs every 4 (four) hours as needed for wheezing or shortness of breath. Uses seasonal, Disp: , Rfl: 3   prochlorperazine (COMPAZINE) 10 MG tablet, Take 1 tablet (10 mg total) by mouth every 6 (six) hours as needed for nausea or vomiting., Disp: 30 tablet, Rfl: 0   REVLIMID 25 MG capsule, Auth#  [id number]                  Date filled:11/24/21 TAKE 1 CAPSULE BY MOUTH ONCE DAILY FOR 14 DAYS ON AND 7 DAYS OFF, Disp: 14 capsule, Rfl:  0  PHYSICAL EXAM: ECOG FS:1 - Symptomatic but completely ambulatory    Vitals:   11/24/21 1315  BP: 134/68  Pulse: 85  Resp: 18  Temp: 98.7 F (37.1 C)  TempSrc: Oral  SpO2: 100%  Weight: 195 lb 9.6 oz (88.7 kg)   Physical Exam Vitals and nursing note reviewed.  Constitutional:      Appearance: She is well-developed. She is not ill-appearing or toxic-appearing.  HENT:     Head: Normocephalic and atraumatic.     Comments: White coating on tongue. No signs of mucositis.     Nose: Nose normal.  Eyes:     General: No scleral icterus.       Right eye: No discharge.        Left eye: No discharge.     Conjunctiva/sclera: Conjunctivae normal.  Neck:     Vascular: No JVD.     Comments: Wearing neck brace. Surgical scar on posterior neck is well healed. Cardiovascular:     Rate and Rhythm: Normal rate and regular rhythm.     Pulses: Normal pulses.     Heart sounds: Normal heart sounds.  Pulmonary:     Effort: Pulmonary effort is normal.     Breath sounds: Normal breath sounds.  Abdominal:     General: There is no distension.  Musculoskeletal:        General: Normal range of motion.     Cervical back: Normal range of motion.     Right lower leg: No edema.     Left lower leg: No edema.  Skin:    General: Skin is warm and dry.     Findings: Rash present.     Comments: Patient gave verbal permission to utilize photo for medical documentation only.  Flushing noted to face, neck and back that blanches. Papular rash on left upper extremity.     Neurological:     Mental Status: She is oriented to person, place, and  time.     GCS: GCS eye subscore is 4. GCS verbal subscore is 5. GCS motor subscore is 6.     Comments: Fluent speech, no facial droop.  Psychiatric:        Behavior: Behavior normal.              LABORATORY DATA: I have reviewed the data as listed CBC Latest Ref Rng & Units 11/18/2021 11/11/2021 11/04/2021  WBC 4.0 - 10.5 K/uL 3.8(L) 4.0 3.5(L)   Hemoglobin 12.0 - 15.0 g/dL 12.0 12.3 12.3  Hematocrit 36.0 - 46.0 % 35.7(L) 36.2 35.0(L)  Platelets 150 - 400 K/uL 163 95(L) 100(L)     CMP Latest Ref Rng & Units 11/18/2021 11/11/2021 11/04/2021  Glucose 70 - 99 mg/dL 131(H) 115(H) 127(H)  BUN 8 - 23 mg/dL '15 18 16  ' Creatinine 0.44 - 1.00 mg/dL 1.00 1.03(H) 1.10(H)  Sodium 135 - 145 mmol/L 140 137 138  Potassium 3.5 - 5.1 mmol/L 3.9 3.3(L) 3.7  Chloride 98 - 111 mmol/L 106 99 100  CO2 22 - 32 mmol/L '26 27 26  ' Calcium 8.9 - 10.3 mg/dL 9.2 9.4 9.4  Total Protein 6.5 - 8.1 g/dL 6.5 6.8 6.8  Total Bilirubin 0.3 - 1.2 mg/dL 0.9 1.7(H) 1.4(H)  Alkaline Phos 38 - 126 U/L 78 85 86  AST 15 - 41 U/L 24 32 38  ALT 0 - 44 U/L 45(H) 58(H) 43       RADIOGRAPHIC STUDIES: I have personally reviewed the radiological images as listed and agreed with the findings in the report.     No results found.   ASSESSMENT & PLAN: Patient is a 66 y.o. female with history of multiple myeloma with plasmacytoma followed by oncologist Dr. Lorenso Courier.  #)Rash- Rash likely related to velcade therapy. Rash is wide spread and patient appears uncomfortable from pruritis. Given dose of IV solumedrol and pruritis and flushing noticeably improved on reassessment. No infectious findings on exam. No signs of anaphylaxis. Will prescribe medrol dosepak to start tomorrow if symptoms still present and bothersome. Patient can continue to take OTC benadryl as needed since has helped with symptoms. She knows to discontinue hydroxyzine and tacrolimus ointment.  #)Thrush- Exam consistent with thrush. No signs of mucositis. Prescribed Nystatin for treatment. Patient has had decreased PO intake, will give 500 mL NS for hydration. She does not appear severely hydrated on exam. Patient is on lasix for ankle swelling, does not appear volume overloaded on exam.   Patient has appointment scheduled tomorrow with oncology team for labs and treatment. Encouraged her to keep appointment  for rash recheck. Strict ED precautions discussed.   Visit Diagnosis: 1. Rash   2. Poor fluid intake      No orders of the defined types were placed in this encounter.   All questions were answered. The patient knows to call the clinic with any problems, questions or concerns. No barriers to learning was detected.  I have spent a total of 30 minutes minutes of face-to-face and non-face-to-face time, preparing to see the patient, obtaining and/or reviewing separately obtained history, performing a medically appropriate examination, counseling and educating the patient,  documenting clinical information in the electronic health record, and care coordination.     Thank you for allowing me to participate in the care of this patient.    Barrie Folk, PA-C Department of Hematology/Oncology [facility] Phone: [phone]  [phone]    11/24/2021 6:11 PM

## 2021-11-24 NOTE — Patient Instructions (Signed)
Methylprednisolone Solution Injection What is this medication? METHYLPREDNISOLONE (meth ill pred NISS oh lone) treats many conditions such as asthma, allergic reactions, arthritis, inflammatory bowel diseases, adrenal, and blood or bone marrow disorders. It works by decreasing inflammation, slowing down an overactive immune system, or replacing cortisol normally made in the body. Cortisol is a hormone that plays an important role in how the body responds to stress, illness, and injury. It belongs to a group of medications called steroids. This medicine may be used for other purposes; ask your health care provider or pharmacist if you have questions. COMMON BRAND NAME(S): A-Methapred, Solu-Medrol What should I tell my care team before I take this medication? They need to know if you have any of these conditions: Cushing's syndrome Eye disease, vision problems Diabetes Glaucoma Heart disease High blood pressure Infection (especially a virus infection such as chickenpox, cold sores, or herpes) Liver disease Mental illness Myasthenia gravis Osteoporosis Recently received or scheduled to receive a vaccine Seizures Stomach or intestine problems Thyroid disease An unusual or allergic reaction to lactose, methylprednisolone, other medications, foods, dyes, or preservatives Pregnant or trying to get pregnant Breast-feeding How should I use this medication? This medication is for injection or infusion into a vein. It is also for injection into a muscle. It is given by your care team in a hospital or clinic setting. Talk to your care team about the use of this medication in children. While this medication may be prescribed for selected conditions, precautions do apply. Overdosage: If you think you have taken too much of this medicine contact a poison control center or emergency room at once. NOTE: This medicine is only for you. Do not share this medicine with others. What if I miss a dose? This  does not apply. What may interact with this medication? Do not take this medication with any of the following: Alefacept Echinacea Iopamidol Live virus vaccines Metyrapone Mifepristone This medication may also interact with the following: Amphotericin B Aspirin and aspirin-like medications Certain antibiotics like erythromycin, clarithromycin, troleandomycin Certain medications for diabetes Certain medications for fungal infection like ketoconazole Certain medications for seizures like carbamazepine, phenobarbital, phenytoin Certain medications that treat or prevent blood clots like warfarin Cyclosporine Digoxin Diuretics Female hormones, like estrogens and birth control pills Isoniazid NSAIDS, medications for pain and inflammation, like ibuprofen or naproxen Other medications for myasthenia gravis Rifampin Vaccines This list may not describe all possible interactions. Give your health care provider a list of all the medicines, herbs, non-prescription drugs, or dietary supplements you use. Also tell them if you smoke, drink alcohol, or use illegal drugs. Some items may interact with your medicine. What should I watch for while using this medication? Tell your care team if your symptoms do not start to get better or if they get worse. Do not stop taking except on your care team's advice. You may develop a severe reaction. Your care team will tell you how much medication to take. Your condition will be monitored carefully while you are receiving this medication. This medication may increase your risk of getting an infection. Tell your care team if you are around anyone with measles or chickenpox, or if you develop sores or blisters that do not heal properly. This medication may increase blood sugar. Ask your care team if changes in diet or medications are needed if you have diabetes. Tell your care team right away if you have any change in your eyesight. Using this medication for a  long time may increase your  risk of low bone mass. Talk to your care team about bone health. What side effects may I notice from receiving this medication? Side effects that you should report to your care team as soon as possible: Allergic reactions--skin rash, itching, hives, swelling of the face, lips, tongue, or throat Cushing syndrome--increased fat around the midsection, upper back, neck, or face, pink or purple stretch marks on the skin, thinning, fragile skin that easily bruises, unexpected hair growth High blood sugar (hyperglycemia)--increased thirst or amount of urine, unusual weakness or fatigue, blurry vision Increase in blood pressure Infection--fever, chills, cough, sore throat, wounds that don't heal, pain or trouble when passing urine, general feeling of discomfort or being unwell Low adrenal gland function--nausea, vomiting, loss of appetite, unusual weakness or fatigue, dizziness Mood and behavior changes--anxiety, nervousness, confusion, hallucinations, irritability, hostility, thoughts of suicide or self-harm, worsening mood, feelings of depression Stomach bleeding--bloody or black, tar-like stools, vomiting blood or brown material that looks like coffee grounds Swelling of the ankles, hands, or feet Side effects that usually do not require medical attention (report to your care team if they continue or are bothersome): Acne General discomfort and fatigue Headache Increase in appetite Nausea Trouble sleeping Weight gain This list may not describe all possible side effects. Call your doctor for medical advice about side effects. You may report side effects to FDA at 1-800-FDA-1088. Where should I keep my medication? This medication is given in a hospital or clinic and will not be stored at home. NOTE: This sheet is a summary. It may not cover all possible information. If you have questions about this medicine, talk to your doctor, pharmacist, or health care provider.  2022  Elsevier/Gold Standard (2021-02-01 00:00:00)

## 2021-11-24 NOTE — Telephone Encounter (Signed)
Received call from pt. She states she has a worsening rash. It is red, raised. It started on her head and has spread to her face, neck, back, arms and chest. Now it seems to be going to her legs. Pt has tried benadryl which only helped a little bit. Her PCP started her on Hydroxyzine and tacrolimus ointment.  Discussed with Dede Query, PA and suggested that pt be seen in our [facility] today. Made pt aware and she is agreeable to coming @ 1pm

## 2021-11-25 ENCOUNTER — Inpatient Hospital Stay (HOSPITAL_BASED_OUTPATIENT_CLINIC_OR_DEPARTMENT_OTHER): Payer: BC Managed Care – PPO | Admitting: Physician Assistant

## 2021-11-25 ENCOUNTER — Inpatient Hospital Stay: Payer: BC Managed Care – PPO

## 2021-11-25 VITALS — BP 125/63 | HR 79 | Temp 98.1°F | Resp 18 | Wt 196.1 lb

## 2021-11-25 DIAGNOSIS — C9030 Solitary plasmacytoma not having achieved remission: Secondary | ICD-10-CM | POA: Diagnosis not present

## 2021-11-25 DIAGNOSIS — C9000 Multiple myeloma not having achieved remission: Secondary | ICD-10-CM

## 2021-11-25 LAB — CBC WITH DIFFERENTIAL (CANCER CENTER ONLY)
Abs Immature Granulocytes: 0.04 10*3/uL (ref 0.00–0.07)
Basophils Absolute: 0 10*3/uL (ref 0.0–0.1)
Basophils Relative: 0 %
Eosinophils Absolute: 0 10*3/uL (ref 0.0–0.5)
Eosinophils Relative: 0 %
HCT: 33 % — ABNORMAL LOW (ref 36.0–46.0)
Hemoglobin: 11.3 g/dL — ABNORMAL LOW (ref 12.0–15.0)
Immature Granulocytes: 1 %
Lymphocytes Relative: 4 %
Lymphs Abs: 0.3 10*3/uL — ABNORMAL LOW (ref 0.7–4.0)
MCH: 30.8 pg (ref 26.0–34.0)
MCHC: 34.2 g/dL (ref 30.0–36.0)
MCV: 89.9 fL (ref 80.0–100.0)
Monocytes Absolute: 0.1 10*3/uL (ref 0.1–1.0)
Monocytes Relative: 2 %
Neutro Abs: 5.6 10*3/uL (ref 1.7–7.7)
Neutrophils Relative %: 93 %
Platelet Count: 117 10*3/uL — ABNORMAL LOW (ref 150–400)
RBC: 3.67 MIL/uL — ABNORMAL LOW (ref 3.87–5.11)
RDW: 16 % — ABNORMAL HIGH (ref 11.5–15.5)
WBC Count: 6 10*3/uL (ref 4.0–10.5)
nRBC: 0 % (ref 0.0–0.2)

## 2021-11-25 LAB — CMP (CANCER CENTER ONLY)
ALT: 29 U/L (ref 0–44)
AST: 16 U/L (ref 15–41)
Albumin: 4.1 g/dL (ref 3.5–5.0)
Alkaline Phosphatase: 72 U/L (ref 38–126)
Anion gap: 10 (ref 5–15)
BUN: 23 mg/dL (ref 8–23)
CO2: 24 mmol/L (ref 22–32)
Calcium: 9.3 mg/dL (ref 8.9–10.3)
Chloride: 103 mmol/L (ref 98–111)
Creatinine: 1.07 mg/dL — ABNORMAL HIGH (ref 0.44–1.00)
GFR, Estimated: 57 mL/min — ABNORMAL LOW (ref >60)
Glucose, Bld: 152 mg/dL — ABNORMAL HIGH (ref 70–99)
Potassium: 4.2 mmol/L (ref 3.5–5.1)
Sodium: 137 mmol/L (ref 135–145)
Total Bilirubin: 0.8 mg/dL (ref 0.3–1.2)
Total Protein: 6.3 g/dL — ABNORMAL LOW (ref 6.5–8.1)

## 2021-11-25 LAB — LACTATE DEHYDROGENASE: LDH: 154 U/L (ref 98–192)

## 2021-11-26 NOTE — Progress Notes (Signed)
[place] [phone]   [phone]  PROGRESS NOTE  Patient Care Team: [name] as PCP - General (Physician Assistant)  Hematological/Oncological History # Plasmacytoma with Multiple Myeloma  07/29/2021: CT cervical spine shows expansile lytic lesion which almost completely replaces the normal C2 vertebral body  08/24/2021: PET CT scan shows increased metabolic activity at C2 and C7  08/25/2021: biopsy of C2 lesion showed finding consistent with plasma cell neoplasm.  09/08/2021: start radiation therapy 09/12/2021: establish care with Dr. Lorenso Courier  09/28/2021: end radiation therapy 10/10/2021: Bone marrow biopsy. Results confirm plasma cell neoplasm consistent with multiple myeloma, kappa restricted.  10/26/2021: Cycle 1 Day 1 of VRD 11/18/2021: Cycle 2 Day 1 of VRD  Interval History:  Carol [name] 66 y.o. female with medical history significant for multiple myeloma with plasmacytoma who presents for a follow up visit. The patient's last visit was on 11/11/2021. In the interim, she presented to [facility] on 11/24/2021 for diffuse rash and thrush. She was given IV solumedrol and medrol dose pack for the rash and nystatin mouthwash for the thrush.   On exam today Carol [name] reports that her rash and associated pruritis have started to improve after the IV and oral steroids. She reports that her energy levels are fairly stable. She reports that her appetite and weight have decreased due to poor taste changes. She adds that certain foods make the metallic taste worse. She is supplemental her diet with three protein shakes per day. She denies nausea, vomiting or abdominal pain. She reports constipation and takes two senna per day and milk of magnesia every few days as needed. She denies easy bruising or signs of bleeding. She denies fevers, chills, night sweats, shortness of breath, chest pain or cough. She has no other complaints.  Rest of the 10 point ROS is  below.  MEDICAL HISTORY:  Past Medical History:  Diagnosis Date   Allergy    Ankle edema    both ankles   Arthritis    hands,knees,elbows   Cancer ([place]) 2016   tumor kidney   Diabetes mellitus    type 2    Eczema    History of kidney stones    Hypothyroidism    Obesity    Sleep apnea    cpap machine setting of 3   Thyroid disease     SURGICAL HISTORY: Past Surgical History:  Procedure Laterality Date   BREAST BIOPSY Right 2001   CHOLECYSTECTOMY  2009   open   COLONOSCOPY     CYSTOSCOPY W/ RETROGRADES Left 11/22/2018   Procedure: CYSTOSCOPY WITH RETROGRADE PYELOGRAM;  Surgeon: Ardis Hughs, MD;  Location: [facility];  Service: Urology;  Laterality: Left;   CYSTOSCOPY WITH RETROGRADE PYELOGRAM, URETEROSCOPY AND STENT PLACEMENT Right 10/03/2021   Procedure: CYSTOSCOPY WITH RETROGRADE PYELOGRAM, URETEROSCOPY,STONE EXTRACTION AND STENT PLACEMENT;  Surgeon: Ceasar Mons, MD;  Location: [facility];  Service: Urology;  Laterality: Right;   DILATATION & CURRETTAGE/HYSTEROSCOPY WITH RESECTOCOPE N/A 01/21/2013   Procedure: DILATATION & CURETTAGE/HYSTEROSCOPY WITH RESECTOCOPE AND RESECTION OF ENDOMETRIAL;  Surgeon: Selinda Orion, MD;  Location: [facility];  Service: Gynecology;  Laterality: N/A;   KNEE ARTHROSCOPY     left    POLYPECTOMY     POSTERIOR CERVICAL FUSION/FORAMINOTOMY N/A 08/25/2021   Procedure: Occiput to Cervical 5 Posterior cervical instrumented fusion with open biopsy of Cervical 2 Mass. Cervical Two Ganglionectomy;  Surgeon: Judith Part, MD;  Location: [place];  Service: Neurosurgery;  Laterality: N/A;   ROBOT ASSISTED PYELOPLASTY Left 11/22/2018   Procedure: XI ROBOTIC ASSISTED ATTEMPTED PYELOPLASTY CONVERTED TO NEPHRECTOMY/LYSIS OF ADHESIONS/UMBILICAL HERNIA REPAIR;  Surgeon: Ardis Hughs, MD;  Location: [facility];  Service: Urology;  Laterality: Left;   ROBOTIC ASSITED PARTIAL NEPHRECTOMY Left 06/11/2015   Procedure: LEFT ROBOTIC  ASSISTED LAPAROSCOPIC  PARTIAL NEPHRECTOMY;  Surgeon: Ardis Hughs, MD;  Location: [facility];  Service: Urology;  Laterality: Left;   ROTATOR CUFF REPAIR Left 2011   URETERAL BIOPSY Left 02/14/2017   Procedure: URETERAL BIOPSY;  Surgeon: Ardis Hughs, MD;  Location: [facility];  Service: Urology;  Laterality: Left;   URETEROSCOPY WITH HOLMIUM LASER LITHOTRIPSY Left 02/14/2017   Procedure: URETEROSCOPY LITHOTRIPSY, URETERAL BALLOON DILATION;  Surgeon: Ardis Hughs, MD;  Location: [facility];  Service: Urology;  Laterality: Left;  With STENT    SOCIAL HISTORY: Social History   Socioeconomic History   Marital status: Married    Spouse name: Not on file   Number of children: Not on file   Years of education: Not on file   Highest education level: Not on file  Occupational History   Not on file  Tobacco Use   Smoking status: Never   Smokeless tobacco: Never  Vaping Use   Vaping Use: Never used  Substance and Sexual Activity   Alcohol use: Yes    Comment: occ   Drug use: No   Sexual activity: Not on file  Other Topics Concern   Not on file  Social History Narrative   Not on file   Social Determinants of Health   Financial Resource Strain: Not on file  Food Insecurity: Not on file  Transportation Needs: Not on file  Physical Activity: Not on file  Stress: Not on file  Social Connections: Not on file  Intimate Partner Violence: Not on file    FAMILY HISTORY: Family History  Problem Relation Age of Onset   Cancer Father     ALLERGIES:  is allergic to morphine.  MEDICATIONS:  Current Outpatient Medications  Medication Sig Dispense Refill   acetaminophen (TYLENOL) 500 MG tablet Take 1,000 mg by mouth every 6 (six) hours as needed for mild pain.     acyclovir (ZOVIRAX) 400 MG tablet Take 1 tablet (400 mg total) by mouth 2 (two) times daily. 60 tablet 3   allopurinol (ZYLOPRIM) 300 MG tablet Take 1 tablet (300 mg total) by mouth daily. 90 tablet 1   Ascorbic Acid  (VITAMIN C ADULT GUMMIES PO) Take 1 tablet by mouth daily.     aspirin EC 81 MG tablet Take 1 tablet (81 mg total) by mouth daily. Swallow whole. 90 tablet 3   atorvastatin (LIPITOR) 20 MG tablet Take 20 mg by mouth at bedtime.     Cholecalciferol (VITAMIN D3) 25 MCG (1000 UT) CAPS Take 1,000 Units by mouth daily.     furosemide (LASIX) 20 MG tablet Take 20 mg by mouth daily as needed for fluid or edema.     levothyroxine (SYNTHROID) 50 MCG tablet Take 50 mcg by mouth daily before breakfast.     Magnesium Hydroxide (MILK OF MAGNESIA PO) Take by mouth. 2 times a week     metFORMIN (GLUCOPHAGE) 1000 MG tablet Take 500 mg by mouth daily with breakfast.     methylPREDNISolone (MEDROL DOSEPAK) 4 MG TBPK tablet Take 6 pills by mouth day 1, 5 on day 2, 4 on day 3, 3 on day 4, 2 on day 5, 1 on day  6 21 tablet 0   nystatin (MYCOSTATIN) 100000 UNIT/ML suspension Take 5 mLs (500,000 Units total) by mouth 4 (four) times daily. 60 mL 0   ondansetron (ZOFRAN ODT) 4 MG disintegrating tablet Take 1 tablet (4 mg total) by mouth every 8 (eight) hours as needed for nausea or vomiting. 30 tablet 0   ondansetron (ZOFRAN) 8 MG tablet Take 1 tablet (8 mg total) by mouth every 8 (eight) hours as needed. 30 tablet 0   OZEMPIC, 0.25 OR 0.5 MG/DOSE, 2 MG/1.5ML SOPN Inject 0.5 mg into the skin every Saturday.     PROAIR HFA 108 (90 BASE) MCG/ACT inhaler Inhale 2 puffs into the lungs every 4 (four) hours as needed for wheezing or shortness of breath. Uses seasonal  3   prochlorperazine (COMPAZINE) 10 MG tablet Take 1 tablet (10 mg total) by mouth every 6 (six) hours as needed for nausea or vomiting. 30 tablet 0   REVLIMID 25 MG capsule Auth#  [id number]                  Date filled:11/24/21 TAKE 1 CAPSULE BY MOUTH ONCE DAILY FOR 14 DAYS ON AND 7 DAYS OFF 14 capsule 0   Sennosides (SENOKOT PO) Take by mouth. Bid     No current facility-administered medications for this visit.    REVIEW OF SYSTEMS:   Constitutional: ( - )  fevers, ( - )  chills , ( - ) night sweats Eyes: ( - ) blurriness of vision, ( - ) double vision, ( - ) watery eyes Ears, nose, mouth, throat, and face: ( - ) mucositis, ( - ) sore throat Respiratory: ( - ) cough, ( - ) dyspnea, ( - ) wheezes Cardiovascular: ( - ) palpitation, ( - ) chest discomfort, ( - ) lower extremity swelling Gastrointestinal:  ( - ) nausea, ( - ) heartburn, ( +) change in bowel habits Skin: ( + ) abnormal skin rashes Lymphatics: ( - ) new lymphadenopathy, ( - ) easy bruising Neurological: ( - ) numbness, ( - ) tingling, ( - ) new weaknesses Behavioral/Psych: ( - ) mood change, ( - ) new changes  All other systems were reviewed with the patient and are negative.  PHYSICAL EXAMINATION: ECOG PERFORMANCE STATUS: 1 - Symptomatic but completely ambulatory  Vitals:   11/25/21 1302  BP: 125/63  Pulse: 79  Resp: 18  Temp: 98.1 F (36.7 C)  SpO2: 100%   Filed Weights   11/25/21 1302  Weight: 196 lb 2 oz (89 kg)    GENERAL: Well-appearing middle-age Caucasian female alert, no distress and comfortable SKIN: diffuse erythema involving the race, neck, back and upper extremities.  EYES: conjunctiva are pink and non-injected, sclera clear LUNGS: clear to auscultation and percussion with normal breathing effort HEART: regular rate & rhythm and no murmurs and no lower extremity edema Musculoskeletal: no cyanosis of digits and no clubbing  PSYCH: alert & oriented x 3, fluent speech NEURO: no focal motor/sensory deficits  LABORATORY DATA:  I have reviewed the data as listed CBC Latest Ref Rng & Units 11/25/2021 11/18/2021 11/11/2021  WBC 4.0 - 10.5 K/uL 6.0 3.8(L) 4.0  Hemoglobin 12.0 - 15.0 g/dL 11.3(L) 12.0 12.3  Hematocrit 36.0 - 46.0 % 33.0(L) 35.7(L) 36.2  Platelets 150 - 400 K/uL 117(L) 163 95(L)    CMP Latest Ref Rng & Units 11/25/2021 11/18/2021 11/11/2021  Glucose 70 - 99 mg/dL 152(H) 131(H) 115(H)  BUN 8 - 23 mg/dL _0 Creatinine  0.44 - 1.00 mg/dL  1.07(H) 1.00 1.03(H)  Sodium 135 - 145 mmol/L 137 140 137  Potassium 3.5 - 5.1 mmol/L 4.2 3.9 3.3(L)  Chloride 98 - 111 mmol/L 103 106 99  CO2 22 - 32 mmol/L _0 Calcium 8.9 - 10.3 mg/dL 9.3 9.2 9.4  Total Protein 6.5 - 8.1 g/dL 6.3(L) 6.5 6.8  Total Bilirubin 0.3 - 1.2 mg/dL 0.8 0.9 1.7(H)  Alkaline Phos 38 - 126 U/L 72 78 85  AST 15 - 41 U/L 16 24 32  ALT 0 - 44 U/L 29 45(H) 58(H)    Lab Results  Component Value Date   MPROTEIN Not Observed 11/11/2021   MPROTEIN Not Observed 10/17/2021   MPROTEIN Not Observed 09/12/2021   Lab Results  Component Value Date   KPAFRELGTCHN 120.7 (H) 11/11/2021   KPAFRELGTCHN 342.1 (H) 10/17/2021   KPAFRELGTCHN 280.3 (H) 09/12/2021   LAMBDASER 17.6 11/11/2021   LAMBDASER 11.7 10/17/2021   LAMBDASER 14.2 09/12/2021   KAPLAMBRATIO 6.86 (H) 11/11/2021   KAPLAMBRATIO 29.24 (H) 10/17/2021   KAPLAMBRATIO 12.12 09/15/2021    RADIOGRAPHIC STUDIES: No results found.  ASSESSMENT & PLAN Carol [name] 66 y.o. female with medical history significant for multiple myeloma with plasmacytoma who presents for a follow up visit.  #Free Kappa Multiple Myeloma -- original finding of L2 plasmacytoma on biopsy of back lesion, bone marrow biopsy confirms greater than 60% plasma cells consistent with multiple myeloma. --Cycle 1 Day 1 of treatment started on 10/26/2021  --will order monthly SPEP, UPEP, SFLC and beta 2 microglobulin --weekly CBC, CMP, and LDH Plan:  --Labs from today were reviewed and require no intervention. --Patient is due for Cycle 2, Day 8 today. Patient has requested to hold treatment today due to ongoing rash and would like a week to recover. She will continue on Revlimid for the time being. She is aware to hold Revlimid if rash doesn't continue to improve or worsens.  --Return to clinic in one week prior to Cycle 2 Day 8.   #Rash: --Present for approximately one week --Received IV solumedrol yesterday --Currently on medrol dose  pak.  --Suspect drug induced (velcade vs revlimid)  #Thrush: --Currently on Nystatin mouthwash with improvement.  --Continue to improve.   #Supportive Care -- chemotherapy education complete  -- port placement not required -- zofran 38m q8H PRN and compazine 173mPO q6H for nausea -- acyclovir 40022mO BID for VCZ prophylaxis -- allopurinol 300m24m daily for TLS prophylaxis -- plan to start zometa after dental clearance is obtained. -- EMLA cream for port -- no pain medication required at this time.   No orders of the defined types were placed in this encounter.    All questions were answered. The patient knows to call the clinic with any problems, questions or concerns.  I have spent a total of 30 minutes minutes of face-to-face and non-face-to-face time, preparing to see the patient,  performing a medically appropriate examination, counseling and educating the patient,documenting clinical information in the electronic health record,and care coordination.   IrenDede QueryC [facility]: [phone]/05/2022 11:10 PM

## 2021-11-27 ENCOUNTER — Encounter: Payer: Self-pay | Admitting: Hematology and Oncology

## 2021-12-02 ENCOUNTER — Other Ambulatory Visit: Payer: Self-pay

## 2021-12-02 ENCOUNTER — Inpatient Hospital Stay: Payer: BC Managed Care – PPO

## 2021-12-02 ENCOUNTER — Inpatient Hospital Stay: Payer: BC Managed Care – PPO | Admitting: Dietician

## 2021-12-02 ENCOUNTER — Encounter: Payer: Self-pay | Admitting: Hematology and Oncology

## 2021-12-02 ENCOUNTER — Inpatient Hospital Stay (HOSPITAL_BASED_OUTPATIENT_CLINIC_OR_DEPARTMENT_OTHER): Payer: BC Managed Care – PPO | Admitting: Hematology and Oncology

## 2021-12-02 ENCOUNTER — Other Ambulatory Visit: Payer: Self-pay | Admitting: *Deleted

## 2021-12-02 VITALS — BP 114/58 | HR 82 | Temp 97.9°F | Resp 16 | Ht 62.0 in | Wt 191.4 lb

## 2021-12-02 DIAGNOSIS — C9000 Multiple myeloma not having achieved remission: Secondary | ICD-10-CM

## 2021-12-02 DIAGNOSIS — C9030 Solitary plasmacytoma not having achieved remission: Secondary | ICD-10-CM | POA: Diagnosis not present

## 2021-12-02 LAB — CBC WITH DIFFERENTIAL (CANCER CENTER ONLY)
Abs Immature Granulocytes: 0.02 10*3/uL (ref 0.00–0.07)
Basophils Absolute: 0 10*3/uL (ref 0.0–0.1)
Basophils Relative: 1 %
Eosinophils Absolute: 0 10*3/uL (ref 0.0–0.5)
Eosinophils Relative: 0 %
HCT: 37.1 % (ref 36.0–46.0)
Hemoglobin: 12.5 g/dL (ref 12.0–15.0)
Immature Granulocytes: 1 %
Lymphocytes Relative: 12 %
Lymphs Abs: 0.4 10*3/uL — ABNORMAL LOW (ref 0.7–4.0)
MCH: 30.9 pg (ref 26.0–34.0)
MCHC: 33.7 g/dL (ref 30.0–36.0)
MCV: 91.6 fL (ref 80.0–100.0)
Monocytes Absolute: 0.3 10*3/uL (ref 0.1–1.0)
Monocytes Relative: 10 %
Neutro Abs: 2.5 10*3/uL (ref 1.7–7.7)
Neutrophils Relative %: 76 %
Platelet Count: 131 10*3/uL — ABNORMAL LOW (ref 150–400)
RBC: 4.05 MIL/uL (ref 3.87–5.11)
RDW: 16.3 % — ABNORMAL HIGH (ref 11.5–15.5)
WBC Count: 3.2 10*3/uL — ABNORMAL LOW (ref 4.0–10.5)
nRBC: 0 % (ref 0.0–0.2)

## 2021-12-02 LAB — CMP (CANCER CENTER ONLY)
ALT: 42 U/L (ref 0–44)
AST: 25 U/L (ref 15–41)
Albumin: 4.2 g/dL (ref 3.5–5.0)
Alkaline Phosphatase: 70 U/L (ref 38–126)
Anion gap: 10 (ref 5–15)
BUN: 15 mg/dL (ref 8–23)
CO2: 26 mmol/L (ref 22–32)
Calcium: 8.8 mg/dL — ABNORMAL LOW (ref 8.9–10.3)
Chloride: 104 mmol/L (ref 98–111)
Creatinine: 1.01 mg/dL — ABNORMAL HIGH (ref 0.44–1.00)
GFR, Estimated: >60 mL/min (ref >60)
Glucose, Bld: 101 mg/dL — ABNORMAL HIGH (ref 70–99)
Potassium: 3.5 mmol/L (ref 3.5–5.1)
Sodium: 140 mmol/L (ref 135–145)
Total Bilirubin: 1.2 mg/dL (ref 0.3–1.2)
Total Protein: 6.2 g/dL — ABNORMAL LOW (ref 6.5–8.1)

## 2021-12-02 LAB — LACTATE DEHYDROGENASE: LDH: 147 U/L (ref 98–192)

## 2021-12-02 NOTE — Progress Notes (Signed)
DISCONTINUE ON PATHWAY REGIMEN - Multiple Myeloma and Other Plasma Cell Dyscrasias     A cycle is every 21 days:     Bortezomib      Lenalidomide      Dexamethasone   **Always confirm dose/schedule in your pharmacy ordering system**  REASON: Toxicities / Adverse Event PRIOR TREATMENT: [id number]: VRd (Bortezomib 1.3 mg/m2 SUBQ D1, 8, 15 + Lenalidomide 25 mg + Dexamethasone 40 mg) q21 Days x 4-6 Cycles Maximum Prior to Stem Cell Harvest Concurrent with Referral to Transplant Service TREATMENT RESPONSE: Partial Response (PR)  START ON PATHWAY REGIMEN - Multiple Myeloma and Other Plasma Cell Dyscrasias     Cycle 1 and 2: A cycle is every 28 days:     Lenalidomide      Dexamethasone      Daratumumab    Cycles 3 through 6: A cycle is every 28 days:     Lenalidomide      Dexamethasone      Daratumumab    Cycles 7 and beyond: A cycle is every 28 days:     Lenalidomide      Dexamethasone      Daratumumab   **Always confirm dose/schedule in your pharmacy ordering system**  Patient Characteristics: Multiple Myeloma, Newly Diagnosed, Transplant Ineligible or Refused, Standard Risk Disease Classification: Multiple Myeloma R-ISS Staging: II Therapeutic Status: Newly Diagnosed Is Patient Eligible for Transplant<= Transplant Ineligible or Refused Risk Status: Standard Risk Intent of Therapy: Curative Intent, Discussed with Patient

## 2021-12-02 NOTE — Progress Notes (Signed)
Nutrition Assessment   Reason for Assessment: MST (+wt loss)   ASSESSMENT: 66 year old female with multiple myeloma with plasmacytoma. S/p posterior cervical fusion on 08/25/21. She is currently receiving VRD q21 days. Patient is followed by Dr. Lorenso Courier.  -Noted patient seen in [facility] for rash and poor fluid intake secondary to thrush   Past medical history includes DM2, bilateral ankle edema, hypothyroidism, HLD, UPJ obstruction, OSA on CPAP, history of kidney cancer s/p partial left nephrectomy in 2016.   Patient did not receive treatment today. Met with patient in clinic after MD visit. Patient reports mouth sores and thrush are improving. She has finished with nystatin rinse. Patient has been using baking soda rinses as advised by nursing. Patient reports ongoing decreased appetite due to metallic taste of foods since completing radiation a few weeks ago. Patient has been eating with plastic silverware, This is not helping much. Patient reports vinegar is helping some. She tried lemon, but this did not work. Patient is drinking 3 Ensure Original (220 kcal, 9 g protein) daily. Patient has been eating small amounts of scrambled egg whites, [place] on crackers, baked chicken, yogurt. Patient reports usually drinking 100 ounces of water. She is unsure of how much she is currently drinking.    Nutrition Focused Physical Exam: deferred   Medications: senokot, Mg hydroxide, nystatin, lasix, metformin, ozempic, methylprednisolone, zofran, compazine, acyclovir, vit C, D3, synthroid, lipitor, zyloprim   Labs: 1/6 - glucose 152, Cr 1.07 1/3 A1c - 5.4   Anthropometrics:   Height: 5'2" Weight: 191 lb 6.4 oz UBW: 219 lb 9.6 oz (08/16/21) BMI: 35.01   NUTRITION DIAGNOSIS: Inadequate oral intake related to cancer treatment side effects as evidenced by reported decreased appetite, altered taste of foods, 12.8% weight decrease in 16 weeks; significant    INTERVENTION:  Educated on importance of  adequate calories and protein to maintain strength, weights Discussed strategies for altered taste - handout with tips provided Recommend baking soda/salt water rinses several times daily - recipe provided  Educated on oral care, recommend pt to purchase new/sanitize toothbrush Discussed soft, moist high protein foods Continue drinking 3 Ensure daily, recommend switching to Ensure Plus/equivalent for added calories and protein - coupons provided Contact information given   MONITORING, EVALUATION, GOAL: Patient will tolerate increased calories and protein to minimize weight loss   Next Visit: f/u Friday January 20 during infusion

## 2021-12-02 NOTE — Progress Notes (Signed)
[place] [phone]   [phone]  PROGRESS NOTE  Patient Care Team: [name] as PCP - General (Physician Assistant)  Hematological/Oncological History # Plasmacytoma with Multiple Myeloma  07/29/2021: CT cervical spine shows expansile lytic lesion which almost completely replaces the normal C2 vertebral body  08/24/2021: PET CT scan shows increased metabolic activity at C2 and C7  08/25/2021: biopsy of C2 lesion showed finding consistent with plasma cell neoplasm.  09/08/2021: start radiation therapy 09/12/2021: establish care with Dr. Lorenso Courier  09/28/2021: end radiation therapy 10/10/2021: Bone marrow biopsy. Results confirm plasma cell neoplasm consistent with multiple myeloma, kappa restricted.  10/26/2021: Cycle 1 Day 1 of VRD 11/18/2021: Cycle 2 Day 1 of VRD 11/25/2021: Velcade HELD due to full body rash. Resolved with steroid therapy.  12/09/2021: intended Cycle 1 Day 1 of Dara/Rev/Dex  Interval History:  Carol [name] 66 y.o. female with medical history significant for multiple myeloma with plasmacytoma who presents for a follow up visit. The patient's last visit was on 11/25/2021. In the interim, she presented to [facility] on 11/24/2021 for diffuse rash and thrush. She was given IV solumedrol and medrol dose pack for the rash and nystatin mouthwash for the thrush.   On exam today Carol [name] reports that the rash has resolved entirely.  She never has any itching, redness, or skin lesions.  She does still have some lesions on her tongue which are uncomfortable but they appear to be resolving.  She is also having some mild itchiness on her scalp.  She notes that when she was undergoing steroid therapy she had "a ton of energy".  She notes that she is otherwise feeling well with no numbness, tingling, or other major side effects as result of her bortezomib therapy.  She denies any nausea or vomiting.  Her appetite has been "so-so".  She does struggle  with constipation and tries to make sure she has a bowel movement "at least every 4 days".  She is also been losing weight but she reports that this is due to her increase in green vegetable intake and less consumption of sugar.  She denies easy bruising or signs of bleeding. She denies fevers, chills, night sweats, shortness of breath, chest pain or cough. She has no other complaints.  Rest of the 10 point ROS is below.  MEDICAL HISTORY:  Past Medical History:  Diagnosis Date   Allergy    Ankle edema    both ankles   Arthritis    hands,knees,elbows   Cancer ([place]) 2016   tumor kidney   Diabetes mellitus    type 2    Eczema    History of kidney stones    Hypothyroidism    Obesity    Sleep apnea    cpap machine setting of 3   Thyroid disease     SURGICAL HISTORY: Past Surgical History:  Procedure Laterality Date   BREAST BIOPSY Right 2001   CHOLECYSTECTOMY  2009   open   COLONOSCOPY     CYSTOSCOPY W/ RETROGRADES Left 11/22/2018   Procedure: CYSTOSCOPY WITH RETROGRADE PYELOGRAM;  Surgeon: Ardis Hughs, MD;  Location: [facility];  Service: Urology;  Laterality: Left;   CYSTOSCOPY WITH RETROGRADE PYELOGRAM, URETEROSCOPY AND STENT PLACEMENT Right 10/03/2021   Procedure: CYSTOSCOPY WITH RETROGRADE PYELOGRAM, URETEROSCOPY,STONE EXTRACTION AND STENT PLACEMENT;  Surgeon: Ceasar Mons, MD;  Location: [facility];  Service: Urology;  Laterality: Right;   DILATATION & CURRETTAGE/HYSTEROSCOPY WITH RESECTOCOPE N/A 01/21/2013   Procedure:  DILATATION & CURETTAGE/HYSTEROSCOPY WITH RESECTOCOPE AND RESECTION OF ENDOMETRIAL;  Surgeon: Selinda Orion, MD;  Location: [facility];  Service: Gynecology;  Laterality: N/A;   KNEE ARTHROSCOPY     left    POLYPECTOMY     POSTERIOR CERVICAL FUSION/FORAMINOTOMY N/A 08/25/2021   Procedure: Occiput to Cervical 5 Posterior cervical instrumented fusion with open biopsy of Cervical 2 Mass. Cervical Two Ganglionectomy;  Surgeon: Judith Part, MD;  Location: [place];  Service: Neurosurgery;  Laterality: N/A;   ROBOT ASSISTED PYELOPLASTY Left 11/22/2018   Procedure: XI ROBOTIC ASSISTED ATTEMPTED PYELOPLASTY CONVERTED TO NEPHRECTOMY/LYSIS OF ADHESIONS/UMBILICAL HERNIA REPAIR;  Surgeon: Ardis Hughs, MD;  Location: [facility];  Service: Urology;  Laterality: Left;   ROBOTIC ASSITED PARTIAL NEPHRECTOMY Left 06/11/2015   Procedure: LEFT ROBOTIC ASSISTED LAPAROSCOPIC  PARTIAL NEPHRECTOMY;  Surgeon: Ardis Hughs, MD;  Location: [facility];  Service: Urology;  Laterality: Left;   ROTATOR CUFF REPAIR Left 2011   URETERAL BIOPSY Left 02/14/2017   Procedure: URETERAL BIOPSY;  Surgeon: Ardis Hughs, MD;  Location: [facility];  Service: Urology;  Laterality: Left;   URETEROSCOPY WITH HOLMIUM LASER LITHOTRIPSY Left 02/14/2017   Procedure: URETEROSCOPY LITHOTRIPSY, URETERAL BALLOON DILATION;  Surgeon: Ardis Hughs, MD;  Location: [facility];  Service: Urology;  Laterality: Left;  With STENT    SOCIAL HISTORY: Social History   Socioeconomic History   Marital status: Married    Spouse name: Not on file   Number of children: Not on file   Years of education: Not on file   Highest education level: Not on file  Occupational History   Not on file  Tobacco Use   Smoking status: Never   Smokeless tobacco: Never  Vaping Use   Vaping Use: Never used  Substance and Sexual Activity   Alcohol use: Yes    Comment: occ   Drug use: No   Sexual activity: Not on file  Other Topics Concern   Not on file  Social History Narrative   Not on file   Social Determinants of Health   Financial Resource Strain: Not on file  Food Insecurity: Not on file  Transportation Needs: Not on file  Physical Activity: Not on file  Stress: Not on file  Social Connections: Not on file  Intimate Partner Violence: Not on file    FAMILY HISTORY: Family History  Problem Relation Age of Onset   Cancer Father     ALLERGIES:  is allergic to  morphine.  MEDICATIONS:  Current Outpatient Medications  Medication Sig Dispense Refill   metFORMIN (GLUCOPHAGE) 500 MG tablet Take 500 mg by mouth daily with breakfast.     acetaminophen (TYLENOL) 500 MG tablet Take 1,000 mg by mouth every 6 (six) hours as needed for mild pain.     acyclovir (ZOVIRAX) 400 MG tablet Take 1 tablet (400 mg total) by mouth 2 (two) times daily. 60 tablet 3   allopurinol (ZYLOPRIM) 300 MG tablet Take 1 tablet (300 mg total) by mouth daily. 90 tablet 1   Ascorbic Acid (VITAMIN C ADULT GUMMIES PO) Take 1 tablet by mouth daily.     aspirin EC 81 MG tablet Take 1 tablet (81 mg total) by mouth daily. Swallow whole. 90 tablet 3   atorvastatin (LIPITOR) 20 MG tablet Take 20 mg by mouth at bedtime.     Cholecalciferol (VITAMIN D3) 25 MCG (1000 UT) CAPS Take 1,000 Units by mouth daily.     furosemide (LASIX) 20 MG  tablet Take 20 mg by mouth daily as needed for fluid or edema.     levothyroxine (SYNTHROID) 50 MCG tablet Take 50 mcg by mouth daily before breakfast.     Magnesium Hydroxide (MILK OF MAGNESIA PO) Take by mouth. 2 times a week     nystatin (MYCOSTATIN) 100000 UNIT/ML suspension Take 5 mLs (500,000 Units total) by mouth 4 (four) times daily. 60 mL 0   ondansetron (ZOFRAN ODT) 4 MG disintegrating tablet Take 1 tablet (4 mg total) by mouth every 8 (eight) hours as needed for nausea or vomiting. 30 tablet 0   ondansetron (ZOFRAN) 8 MG tablet Take 1 tablet (8 mg total) by mouth every 8 (eight) hours as needed. 30 tablet 0   OZEMPIC, 0.25 OR 0.5 MG/DOSE, 2 MG/1.5ML SOPN Inject 0.5 mg into the skin every Saturday.     PROAIR HFA 108 (90 BASE) MCG/ACT inhaler Inhale 2 puffs into the lungs every 4 (four) hours as needed for wheezing or shortness of breath. Uses seasonal  3   prochlorperazine (COMPAZINE) 10 MG tablet Take 1 tablet (10 mg total) by mouth every 6 (six) hours as needed for nausea or vomiting. 30 tablet 0   REVLIMID 25 MG capsule Auth#  [id number]                   Date filled:11/24/21 TAKE 1 CAPSULE BY MOUTH ONCE DAILY FOR 14 DAYS ON AND 7 DAYS OFF 14 capsule 0   Sennosides (SENOKOT PO) Take by mouth. Bid     No current facility-administered medications for this visit.    REVIEW OF SYSTEMS:   Constitutional: ( - ) fevers, ( - )  chills , ( - ) night sweats Eyes: ( - ) blurriness of vision, ( - ) double vision, ( - ) watery eyes Ears, nose, mouth, throat, and face: ( - ) mucositis, ( - ) sore throat Respiratory: ( - ) cough, ( - ) dyspnea, ( - ) wheezes Cardiovascular: ( - ) palpitation, ( - ) chest discomfort, ( - ) lower extremity swelling Gastrointestinal:  ( - ) nausea, ( - ) heartburn, ( -) change in bowel habits Skin: ( - ) abnormal skin rashes Lymphatics: ( - ) new lymphadenopathy, ( - ) easy bruising Neurological: ( - ) numbness, ( - ) tingling, ( - ) new weaknesses Behavioral/Psych: ( - ) mood change, ( - ) new changes  All other systems were reviewed with the patient and are negative.  PHYSICAL EXAMINATION: ECOG PERFORMANCE STATUS: 1 - Symptomatic but completely ambulatory  Vitals:   12/02/21 1117  BP: (!) 114/58  Pulse: 82  Resp: 16  Temp: 97.9 F (36.6 C)  SpO2: 100%    Filed Weights   12/02/21 1117  Weight: 191 lb 6.4 oz (86.8 kg)     GENERAL: Well-appearing middle-age Caucasian female alert, no distress and comfortable SKIN: No evidence of rash, erythema, dry skin, or raised lesions. EYES: conjunctiva are pink and non-injected, sclera clear LUNGS: clear to auscultation and percussion with normal breathing effort HEART: regular rate & rhythm and no murmurs and no lower extremity edema Musculoskeletal: no cyanosis of digits and no clubbing  PSYCH: alert & oriented x 3, fluent speech NEURO: no focal motor/sensory deficits  LABORATORY DATA:  I have reviewed the data as listed CBC Latest Ref Rng & Units 12/02/2021 11/25/2021 11/18/2021  WBC 4.0 - 10.5 K/uL 3.2(L) 6.0 3.8(L)  Hemoglobin 12.0 - 15.0 g/dL 12.5 11.3(L) 12.0   Hematocrit  36.0 - 46.0 % 37.1 33.0(L) 35.7(L)  Platelets 150 - 400 K/uL 131(L) 117(L) 163    CMP Latest Ref Rng & Units 12/02/2021 11/25/2021 11/18/2021  Glucose 70 - 99 mg/dL 101(H) 152(H) 131(H)  BUN 8 - 23 mg/dL '15 23 15  ' Creatinine 0.44 - 1.00 mg/dL 1.01(H) 1.07(H) 1.00  Sodium 135 - 145 mmol/L 140 137 140  Potassium 3.5 - 5.1 mmol/L 3.5 4.2 3.9  Chloride 98 - 111 mmol/L 104 103 106  CO2 22 - 32 mmol/L '26 24 26  ' Calcium 8.9 - 10.3 mg/dL 8.8(L) 9.3 9.2  Total Protein 6.5 - 8.1 g/dL 6.2(L) 6.3(L) 6.5  Total Bilirubin 0.3 - 1.2 mg/dL 1.2 0.8 0.9  Alkaline Phos 38 - 126 U/L 70 72 78  AST 15 - 41 U/L '25 16 24  ' ALT 0 - 44 U/L 42 29 45(H)    Lab Results  Component Value Date   MPROTEIN Not Observed 11/11/2021   MPROTEIN Not Observed 10/17/2021   MPROTEIN Not Observed 09/12/2021   Lab Results  Component Value Date   KPAFRELGTCHN 120.7 (H) 11/11/2021   KPAFRELGTCHN 342.1 (H) 10/17/2021   KPAFRELGTCHN 280.3 (H) 09/12/2021   LAMBDASER 17.6 11/11/2021   LAMBDASER 11.7 10/17/2021   LAMBDASER 14.2 09/12/2021   KAPLAMBRATIO 6.86 (H) 11/11/2021   KAPLAMBRATIO 29.24 (H) 10/17/2021   KAPLAMBRATIO 12.12 09/15/2021    RADIOGRAPHIC STUDIES: No results found.  ASSESSMENT & PLAN TATTIANA FAKHOURI 65 y.o. female with medical history significant for multiple myeloma with plasmacytoma who presents for a follow up visit.   # Free Kappa Multiple Myeloma # Plasmacytoma  -- original finding of L2 plasmacytoma on biopsy of back lesion, bone marrow biopsy confirms greater than 60% plasma cells consistent with multiple myeloma. --Cycle 1 Day 1 of VRD treatment started on 10/26/2021  --VRD therapy stopped on 11/25/2021 due to rash. Start of Dara/Rev/Dex on 12/09/2021.  --will order monthly SPEP, UPEP, SFLC and beta 2 microglobulin --weekly CBC, CMP, and LDH Plan:  --HOLD further doses of velcade  --plan to transition to Dara/Rev/Dex at next visit --RTC in 2 weeks with interval weekly treatment.    #Rash--resolved --Present for approximately one week --Received IV solumedrol and medrol dose pak.  --Suspect drug induced (velcade)  #Thrush: --Currently on Nystatin mouthwash with improvement.  --Continue to improve.   #Supportive Care -- chemotherapy education complete  -- port placement not required -- zofran 28m q8H PRN and compazine 173mPO q6H for nausea -- acyclovir 40082mO BID for VCZ prophylaxis -- allopurinol 300m56m daily for TLS prophylaxis -- plan to start zometa after dental clearance is obtained. -- EMLA cream for port -- no pain medication required at this time.   Orders Placed This Encounter  Procedures   Type and screen         Standing Status:   Future    Standing Expiration Date:   12/02/2022   All questions were answered. The patient knows to call the clinic with any problems, questions or concerns.  I have spent a total of 30 minutes minutes of face-to-face and non-face-to-face time, preparing to see the patient,  performing a medically appropriate examination, counseling and educating the patient,documenting clinical information in the electronic health record,and care coordination.   JohnLedell Peoples Department of Hematology/Oncology [facility]: [phone]: [phone]: [email or web address]  12/04/2021 3:23 PM

## 2021-12-04 ENCOUNTER — Encounter: Payer: Self-pay | Admitting: Hematology and Oncology

## 2021-12-09 ENCOUNTER — Inpatient Hospital Stay: Payer: BC Managed Care – PPO | Admitting: Dietician

## 2021-12-09 ENCOUNTER — Inpatient Hospital Stay (HOSPITAL_BASED_OUTPATIENT_CLINIC_OR_DEPARTMENT_OTHER): Payer: BC Managed Care – PPO | Admitting: Hematology and Oncology

## 2021-12-09 ENCOUNTER — Other Ambulatory Visit: Payer: Self-pay

## 2021-12-09 ENCOUNTER — Inpatient Hospital Stay: Payer: BC Managed Care – PPO

## 2021-12-09 ENCOUNTER — Encounter: Payer: Self-pay | Admitting: Hematology and Oncology

## 2021-12-09 VITALS — BP 131/64 | HR 75 | Temp 98.2°F | Resp 18 | Wt 188.3 lb

## 2021-12-09 DIAGNOSIS — C9030 Solitary plasmacytoma not having achieved remission: Secondary | ICD-10-CM | POA: Diagnosis not present

## 2021-12-09 DIAGNOSIS — C9000 Multiple myeloma not having achieved remission: Secondary | ICD-10-CM

## 2021-12-09 LAB — CBC WITH DIFFERENTIAL (CANCER CENTER ONLY)
Abs Immature Granulocytes: 0 10*3/uL (ref 0.00–0.07)
Basophils Absolute: 0 10*3/uL (ref 0.0–0.1)
Basophils Relative: 1 %
Eosinophils Absolute: 0 10*3/uL (ref 0.0–0.5)
Eosinophils Relative: 0 %
HCT: 33.4 % — ABNORMAL LOW (ref 36.0–46.0)
Hemoglobin: 11.5 g/dL — ABNORMAL LOW (ref 12.0–15.0)
Immature Granulocytes: 0 %
Lymphocytes Relative: 23 %
Lymphs Abs: 0.5 10*3/uL — ABNORMAL LOW (ref 0.7–4.0)
MCH: 31.5 pg (ref 26.0–34.0)
MCHC: 34.4 g/dL (ref 30.0–36.0)
MCV: 91.5 fL (ref 80.0–100.0)
Monocytes Absolute: 0.3 10*3/uL (ref 0.1–1.0)
Monocytes Relative: 16 %
Neutro Abs: 1.2 10*3/uL — ABNORMAL LOW (ref 1.7–7.7)
Neutrophils Relative %: 60 %
Platelet Count: 115 10*3/uL — ABNORMAL LOW (ref 150–400)
RBC: 3.65 MIL/uL — ABNORMAL LOW (ref 3.87–5.11)
RDW: 17 % — ABNORMAL HIGH (ref 11.5–15.5)
WBC Count: 2 10*3/uL — ABNORMAL LOW (ref 4.0–10.5)
nRBC: 0 % (ref 0.0–0.2)

## 2021-12-09 LAB — CMP (CANCER CENTER ONLY)
ALT: 35 U/L (ref 0–44)
AST: 27 U/L (ref 15–41)
Albumin: 4.1 g/dL (ref 3.5–5.0)
Alkaline Phosphatase: 74 U/L (ref 38–126)
Anion gap: 10 (ref 5–15)
BUN: 17 mg/dL (ref 8–23)
CO2: 26 mmol/L (ref 22–32)
Calcium: 9.5 mg/dL (ref 8.9–10.3)
Chloride: 105 mmol/L (ref 98–111)
Creatinine: 0.95 mg/dL (ref 0.44–1.00)
GFR, Estimated: >60 mL/min (ref >60)
Glucose, Bld: 108 mg/dL — ABNORMAL HIGH (ref 70–99)
Potassium: 3.5 mmol/L (ref 3.5–5.1)
Sodium: 141 mmol/L (ref 135–145)
Total Bilirubin: 1.1 mg/dL (ref 0.3–1.2)
Total Protein: 6.2 g/dL — ABNORMAL LOW (ref 6.5–8.1)

## 2021-12-09 LAB — LACTATE DEHYDROGENASE: LDH: 171 U/L (ref 98–192)

## 2021-12-09 LAB — TYPE AND SCREEN
ABO/RH(D): O NEG
Antibody Screen: NEGATIVE

## 2021-12-09 NOTE — Progress Notes (Signed)
[name] Telephone:([phone]) [phone]   Fax:([phone]  PROGRESS NOTE  Patient Care Team: [name] as PCP - General (Physician Assistant)  Hematological/Oncological History # Plasmacytoma with Multiple Myeloma  07/29/2021: CT cervical spine shows expansile lytic lesion which almost completely replaces the normal C2 vertebral body  08/24/2021: PET CT scan shows increased metabolic activity at C2 and C7  08/25/2021: biopsy of C2 lesion showed finding consistent with plasma cell neoplasm.  09/08/2021: start radiation therapy 09/12/2021: establish care with Dr. Lorenso Courier  09/28/2021: end radiation therapy 10/10/2021: Bone marrow biopsy. Results confirm plasma cell neoplasm consistent with multiple myeloma, kappa restricted.  10/26/2021: Cycle 1 Day 1 of VRD 11/18/2021: Cycle 2 Day 1 of VRD 11/25/2021: Velcade HELD due to full body rash. Resolved with steroid therapy.  12/16/2021: intended Cycle 1 Day 1 of Dara/Rev/Dex  Interval History:  Carol [name] 66 y.o. female with medical history significant for multiple myeloma with plasmacytoma who presents for a follow up visit. The patient's last visit was on 12/02/2021. In the interim she has no major changes in her health.   On exam today Carol [name] reports that she feels good today.  She notes that her skin rash has near completely resolved at this time and she only has about 1-2 sores left in her mouth.  She knows she is not having any itching on her hairline.  She continues to struggle with constipation but Milk of Magnesia has been helping with this.  She notes that she does "not have a lot of energy".  She reports that her energy is about a 5 out of 10.  She has been having some trouble eating and will be meeting with a nutritionist.  She simply has no appetite.  She also did recently cut out sugar which may be contributing to her decline in weight.  She notes otherwise no nausea, vomiting, or diarrhea.  She denies easy  bruising or signs of bleeding. She denies fevers, chills, night sweats, shortness of breath, chest pain or cough. She has no other complaints.  Rest of the 10 point ROS is below.  MEDICAL HISTORY:  Past Medical History:  Diagnosis Date   Allergy    Ankle edema    both ankles   Arthritis    hands,knees,elbows   Cancer ([place]) 2016   tumor kidney   Diabetes mellitus    type 2    Eczema    History of kidney stones    Hypothyroidism    Obesity    Sleep apnea    cpap machine setting of 3   Thyroid disease     SURGICAL HISTORY: Past Surgical History:  Procedure Laterality Date   BREAST BIOPSY Right 2001   CHOLECYSTECTOMY  2009   open   COLONOSCOPY     CYSTOSCOPY W/ RETROGRADES Left 11/22/2018   Procedure: CYSTOSCOPY WITH RETROGRADE PYELOGRAM;  Surgeon: Ardis Hughs, MD;  Location: [facility];  Service: Urology;  Laterality: Left;   CYSTOSCOPY WITH RETROGRADE PYELOGRAM, URETEROSCOPY AND STENT PLACEMENT Right 10/03/2021   Procedure: CYSTOSCOPY WITH RETROGRADE PYELOGRAM, URETEROSCOPY,STONE EXTRACTION AND STENT PLACEMENT;  Surgeon: Ceasar Mons, MD;  Location: [facility];  Service: Urology;  Laterality: Right;   DILATATION & CURRETTAGE/HYSTEROSCOPY WITH RESECTOCOPE N/A 01/21/2013   Procedure: DILATATION & CURETTAGE/HYSTEROSCOPY WITH RESECTOCOPE AND RESECTION OF ENDOMETRIAL;  Surgeon: Selinda Orion, MD;  Location: [facility];  Service: Gynecology;  Laterality: N/A;   KNEE ARTHROSCOPY     left  POLYPECTOMY     POSTERIOR CERVICAL FUSION/FORAMINOTOMY N/A 08/25/2021   Procedure: Occiput to Cervical 5 Posterior cervical instrumented fusion with open biopsy of Cervical 2 Mass. Cervical Two Ganglionectomy;  Surgeon: Judith Part, MD;  Location: [facility];  Service: Neurosurgery;  Laterality: N/A;   ROBOT ASSISTED PYELOPLASTY Left 11/22/2018   Procedure: XI ROBOTIC ASSISTED ATTEMPTED PYELOPLASTY CONVERTED TO NEPHRECTOMY/LYSIS OF ADHESIONS/UMBILICAL HERNIA REPAIR;   Surgeon: Ardis Hughs, MD;  Location: [facility];  Service: Urology;  Laterality: Left;   ROBOTIC ASSITED PARTIAL NEPHRECTOMY Left 06/11/2015   Procedure: LEFT ROBOTIC ASSISTED LAPAROSCOPIC  PARTIAL NEPHRECTOMY;  Surgeon: Ardis Hughs, MD;  Location: [facility];  Service: Urology;  Laterality: Left;   ROTATOR CUFF REPAIR Left 2011   URETERAL BIOPSY Left 02/14/2017   Procedure: URETERAL BIOPSY;  Surgeon: Ardis Hughs, MD;  Location: [facility];  Service: Urology;  Laterality: Left;   URETEROSCOPY WITH HOLMIUM LASER LITHOTRIPSY Left 02/14/2017   Procedure: URETEROSCOPY LITHOTRIPSY, URETERAL BALLOON DILATION;  Surgeon: Ardis Hughs, MD;  Location: [facility];  Service: Urology;  Laterality: Left;  With STENT    SOCIAL HISTORY: Social History   Socioeconomic History   Marital status: Married    Spouse name: Not on file   Number of children: Not on file   Years of education: Not on file   Highest education level: Not on file  Occupational History   Not on file  Tobacco Use   Smoking status: Never   Smokeless tobacco: Never  Vaping Use   Vaping Use: Never used  Substance and Sexual Activity   Alcohol use: Yes    Comment: occ   Drug use: No   Sexual activity: Not on file  Other Topics Concern   Not on file  Social History Narrative   Not on file   Social Determinants of Health   Financial Resource Strain: Not on file  Food Insecurity: Not on file  Transportation Needs: Not on file  Physical Activity: Not on file  Stress: Not on file  Social Connections: Not on file  Intimate Partner Violence: Not on file    FAMILY HISTORY: Family History  Problem Relation Age of Onset   Cancer Father     ALLERGIES:  is allergic to morphine.  MEDICATIONS:  Current Outpatient Medications  Medication Sig Dispense Refill   acetaminophen (TYLENOL) 500 MG tablet Take 1,000 mg by mouth every 6 (six) hours as needed for mild pain.     acyclovir (ZOVIRAX) 400 MG tablet Take 1 tablet  (400 mg total) by mouth 2 (two) times daily. 60 tablet 3   allopurinol (ZYLOPRIM) 300 MG tablet Take 1 tablet (300 mg total) by mouth daily. 90 tablet 1   Ascorbic Acid (VITAMIN C ADULT GUMMIES PO) Take 1 tablet by mouth daily.     aspirin EC 81 MG tablet Take 1 tablet (81 mg total) by mouth daily. Swallow whole. 90 tablet 3   atorvastatin (LIPITOR) 20 MG tablet Take 20 mg by mouth at bedtime.     Cholecalciferol (VITAMIN D3) 25 MCG (1000 UT) CAPS Take 1,000 Units by mouth daily.     furosemide (LASIX) 20 MG tablet Take 20 mg by mouth daily as needed for fluid or edema.     levothyroxine (SYNTHROID) 50 MCG tablet Take 50 mcg by mouth daily before breakfast.     Magnesium Hydroxide (MILK OF MAGNESIA PO) Take by mouth. 2 times a week     metFORMIN (GLUCOPHAGE) 500 MG tablet  Take 500 mg by mouth daily with breakfast.     nystatin (MYCOSTATIN) 100000 UNIT/ML suspension Take 5 mLs (500,000 Units total) by mouth 4 (four) times daily. 60 mL 0   ondansetron (ZOFRAN ODT) 4 MG disintegrating tablet Take 1 tablet (4 mg total) by mouth every 8 (eight) hours as needed for nausea or vomiting. 30 tablet 0   ondansetron (ZOFRAN) 8 MG tablet Take 1 tablet (8 mg total) by mouth every 8 (eight) hours as needed. 30 tablet 0   OZEMPIC, 0.25 OR 0.5 MG/DOSE, 2 MG/1.5ML SOPN Inject 0.5 mg into the skin every Saturday.     PROAIR HFA 108 (90 BASE) MCG/ACT inhaler Inhale 2 puffs into the lungs every 4 (four) hours as needed for wheezing or shortness of breath. Uses seasonal  3   prochlorperazine (COMPAZINE) 10 MG tablet Take 1 tablet (10 mg total) by mouth every 6 (six) hours as needed for nausea or vomiting. 30 tablet 0   REVLIMID 25 MG capsule Auth#  [id number]                  Date filled:11/24/21 TAKE 1 CAPSULE BY MOUTH ONCE DAILY FOR 14 DAYS ON AND 7 DAYS OFF 14 capsule 0   Sennosides (SENOKOT PO) Take by mouth. Bid     No current facility-administered medications for this visit.    REVIEW OF SYSTEMS:    Constitutional: ( - ) fevers, ( - )  chills , ( - ) night sweats Eyes: ( - ) blurriness of vision, ( - ) double vision, ( - ) watery eyes Ears, nose, mouth, throat, and face: ( - ) mucositis, ( - ) sore throat Respiratory: ( - ) cough, ( - ) dyspnea, ( - ) wheezes Cardiovascular: ( - ) palpitation, ( - ) chest discomfort, ( - ) lower extremity swelling Gastrointestinal:  ( - ) nausea, ( - ) heartburn, ( -) change in bowel habits Skin: ( - ) abnormal skin rashes Lymphatics: ( - ) new lymphadenopathy, ( - ) easy bruising Neurological: ( - ) numbness, ( - ) tingling, ( - ) new weaknesses Behavioral/Psych: ( - ) mood change, ( - ) new changes  All other systems were reviewed with the patient and are negative.  PHYSICAL EXAMINATION: ECOG PERFORMANCE STATUS: 1 - Symptomatic but completely ambulatory  Vitals:   12/09/21 1010  BP: 131/64  Pulse: 75  Resp: 18  Temp: 98.2 F (36.8 C)  SpO2: 100%    Filed Weights   12/09/21 1010  Weight: 188 lb 5 oz (85.4 kg)    GENERAL: Well-appearing middle-age Caucasian female alert, no distress and comfortable SKIN: No evidence of rash, erythema, dry skin, or raised lesions. EYES: conjunctiva are pink and non-injected, sclera clear LUNGS: clear to auscultation and percussion with normal breathing effort HEART: regular rate & rhythm and no murmurs and no lower extremity edema Musculoskeletal: no cyanosis of digits and no clubbing  PSYCH: alert & oriented x 3, fluent speech NEURO: no focal motor/sensory deficits  LABORATORY DATA:  I have reviewed the data as listed CBC Latest Ref Rng & Units 12/09/2021 12/02/2021 11/25/2021  WBC 4.0 - 10.5 K/uL 2.0(L) 3.2(L) 6.0  Hemoglobin 12.0 - 15.0 g/dL 11.5(L) 12.5 11.3(L)  Hematocrit 36.0 - 46.0 % 33.4(L) 37.1 33.0(L)  Platelets 150 - 400 K/uL 115(L) 131(L) 117(L)    CMP Latest Ref Rng & Units 12/09/2021 12/02/2021 11/25/2021  Glucose 70 - 99 mg/dL 108(H) 101(H) 152(H)  BUN 8 - 23  mg/dL '17 15 23  ' Creatinine  0.44 - 1.00 mg/dL 0.95 1.01(H) 1.07(H)  Sodium 135 - 145 mmol/L 141 140 137  Potassium 3.5 - 5.1 mmol/L 3.5 3.5 4.2  Chloride 98 - 111 mmol/L 105 104 103  CO2 22 - 32 mmol/L '26 26 24  ' Calcium 8.9 - 10.3 mg/dL 9.5 8.8(L) 9.3  Total Protein 6.5 - 8.1 g/dL 6.2(L) 6.2(L) 6.3(L)  Total Bilirubin 0.3 - 1.2 mg/dL 1.1 1.2 0.8  Alkaline Phos 38 - 126 U/L 74 70 72  AST 15 - 41 U/L '27 25 16  ' ALT 0 - 44 U/L 35 42 29    Lab Results  Component Value Date   MPROTEIN Not Observed 11/11/2021   MPROTEIN Not Observed 10/17/2021   MPROTEIN Not Observed 09/12/2021   Lab Results  Component Value Date   KPAFRELGTCHN 120.7 (H) 11/11/2021   KPAFRELGTCHN 342.1 (H) 10/17/2021   KPAFRELGTCHN 280.3 (H) 09/12/2021   LAMBDASER 17.6 11/11/2021   LAMBDASER 11.7 10/17/2021   LAMBDASER 14.2 09/12/2021   KAPLAMBRATIO 6.86 (H) 11/11/2021   KAPLAMBRATIO 29.24 (H) 10/17/2021   KAPLAMBRATIO 12.12 09/15/2021    RADIOGRAPHIC STUDIES: No results found.  ASSESSMENT & PLAN DENNISHA MOUSER 66 y.o. female with medical history significant for multiple myeloma with plasmacytoma who presents for a follow up visit.   # Free Kappa Multiple Myeloma # Plasmacytoma  -- original finding of L2 plasmacytoma on biopsy of back lesion, bone marrow biopsy confirms greater than 60% plasma cells consistent with multiple myeloma. --Cycle 1 Day 1 of VRD treatment started on 10/26/2021  --VRD therapy stopped on 11/25/2021 due to rash. Start of Dara/Rev/Dex on 12/09/2021.  --will order monthly SPEP, UPEP, SFLC and beta 2 microglobulin --weekly CBC, CMP, and LDH Plan:  --plan to transition to Dara/Rev/Dex at next visit. This was planned for today but due to a scheduling issue will be postponed to next week.  --RTC in 2 weeks with interval weekly treatment.   #Rash--resolved --Present for approximately one week --Received IV solumedrol and medrol dose pak.  --Suspect drug induced (velcade)  #Thrush: --Currently on Nystatin mouthwash  with improvement.  --Continue to improve.   #Supportive Care -- chemotherapy education complete  -- port placement not required -- zofran 7m q8H PRN and compazine 179mPO q6H for nausea -- acyclovir 40080mO BID for VCZ prophylaxis -- allopurinol 300m57m daily for TLS prophylaxis -- plan to start zometa after dental clearance is obtained. -- EMLA cream for port -- no pain medication required at this time.   No orders of the defined types were placed in this encounter.  All questions were answered. The patient knows to call the clinic with any problems, questions or concerns.  I have spent a total of 30 minutes minutes of face-to-face and non-face-to-face time, preparing to see the patient,  performing a medically appropriate examination, counseling and educating the patient,documenting clinical information in the electronic health record,and care coordination.   JohnLedell Peoples Department of Hematology/Oncology [facility]: [phone]: [phone]: [email or web address]  12/10/2021 2:57 PM

## 2021-12-09 NOTE — Progress Notes (Signed)
Patient infusion cancelled today. Nutrition follow-up rescheduled for Friday January 27 during infusion.

## 2021-12-10 ENCOUNTER — Encounter: Payer: Self-pay | Admitting: Hematology and Oncology

## 2021-12-12 LAB — KAPPA/LAMBDA LIGHT CHAINS
Kappa free light chain: 32.6 mg/L — ABNORMAL HIGH (ref 3.3–19.4)
Kappa, lambda light chain ratio: 3.51 — ABNORMAL HIGH (ref 0.26–1.65)
Lambda free light chains: 9.3 mg/L (ref 5.7–26.3)

## 2021-12-12 LAB — PRETREATMENT RBC PHENOTYPE: DAT, IgG: NEGATIVE

## 2021-12-13 LAB — MULTIPLE MYELOMA PANEL, SERUM
Albumin SerPl Elph-Mcnc: 3.4 g/dL (ref 2.9–4.4)
Albumin/Glob SerPl: 1.5 (ref 0.7–1.7)
Alpha 1: 0.3 g/dL (ref 0.0–0.4)
Alpha2 Glob SerPl Elph-Mcnc: 0.6 g/dL (ref 0.4–1.0)
B-Globulin SerPl Elph-Mcnc: 1 g/dL (ref 0.7–1.3)
Gamma Glob SerPl Elph-Mcnc: 0.5 g/dL (ref 0.4–1.8)
Globulin, Total: 2.4 g/dL (ref 2.2–3.9)
IgA: 18 mg/dL — ABNORMAL LOW (ref 87–352)
IgG (Immunoglobin G), Serum: 352 mg/dL — ABNORMAL LOW (ref 586–1602)
IgM (Immunoglobulin M), Srm: 11 mg/dL — ABNORMAL LOW (ref 26–217)
Total Protein ELP: 5.8 g/dL — ABNORMAL LOW (ref 6.0–8.5)

## 2021-12-14 ENCOUNTER — Other Ambulatory Visit: Payer: Self-pay | Admitting: Physician Assistant

## 2021-12-14 DIAGNOSIS — C9000 Multiple myeloma not having achieved remission: Secondary | ICD-10-CM

## 2021-12-16 ENCOUNTER — Other Ambulatory Visit: Payer: Self-pay

## 2021-12-16 ENCOUNTER — Inpatient Hospital Stay: Payer: BC Managed Care – PPO

## 2021-12-16 ENCOUNTER — Inpatient Hospital Stay: Payer: BC Managed Care – PPO | Admitting: Dietician

## 2021-12-16 ENCOUNTER — Other Ambulatory Visit: Payer: Self-pay | Admitting: Hematology and Oncology

## 2021-12-16 ENCOUNTER — Inpatient Hospital Stay: Payer: BC Managed Care – PPO | Admitting: Hematology and Oncology

## 2021-12-16 VITALS — BP 117/70 | HR 78 | Temp 98.8°F | Resp 18 | Wt 187.0 lb

## 2021-12-16 DIAGNOSIS — C9030 Solitary plasmacytoma not having achieved remission: Secondary | ICD-10-CM | POA: Diagnosis not present

## 2021-12-16 DIAGNOSIS — C9000 Multiple myeloma not having achieved remission: Secondary | ICD-10-CM

## 2021-12-16 LAB — CMP (CANCER CENTER ONLY)
ALT: 32 U/L (ref 0–44)
AST: 25 U/L (ref 15–41)
Albumin: 4.4 g/dL (ref 3.5–5.0)
Alkaline Phosphatase: 87 U/L (ref 38–126)
Anion gap: 9 (ref 5–15)
BUN: 13 mg/dL (ref 8–23)
CO2: 30 mmol/L (ref 22–32)
Calcium: 9.4 mg/dL (ref 8.9–10.3)
Chloride: 102 mmol/L (ref 98–111)
Creatinine: 0.95 mg/dL (ref 0.44–1.00)
GFR, Estimated: >60 mL/min (ref >60)
Glucose, Bld: 104 mg/dL — ABNORMAL HIGH (ref 70–99)
Potassium: 3.3 mmol/L — ABNORMAL LOW (ref 3.5–5.1)
Sodium: 141 mmol/L (ref 135–145)
Total Bilirubin: 1.4 mg/dL — ABNORMAL HIGH (ref 0.3–1.2)
Total Protein: 6.4 g/dL — ABNORMAL LOW (ref 6.5–8.1)

## 2021-12-16 LAB — CBC WITH DIFFERENTIAL (CANCER CENTER ONLY)
Abs Immature Granulocytes: 0.01 10*3/uL (ref 0.00–0.07)
Basophils Absolute: 0 10*3/uL (ref 0.0–0.1)
Basophils Relative: 1 %
Eosinophils Absolute: 0 10*3/uL (ref 0.0–0.5)
Eosinophils Relative: 0 %
HCT: 35.1 % — ABNORMAL LOW (ref 36.0–46.0)
Hemoglobin: 12.1 g/dL (ref 12.0–15.0)
Immature Granulocytes: 1 %
Lymphocytes Relative: 27 %
Lymphs Abs: 0.4 10*3/uL — ABNORMAL LOW (ref 0.7–4.0)
MCH: 31.5 pg (ref 26.0–34.0)
MCHC: 34.5 g/dL (ref 30.0–36.0)
MCV: 91.4 fL (ref 80.0–100.0)
Monocytes Absolute: 0.1 10*3/uL (ref 0.1–1.0)
Monocytes Relative: 5 %
Neutro Abs: 1.1 10*3/uL — ABNORMAL LOW (ref 1.7–7.7)
Neutrophils Relative %: 66 %
Platelet Count: 122 10*3/uL — ABNORMAL LOW (ref 150–400)
RBC: 3.84 MIL/uL — ABNORMAL LOW (ref 3.87–5.11)
RDW: 17.5 % — ABNORMAL HIGH (ref 11.5–15.5)
WBC Count: 1.7 10*3/uL — ABNORMAL LOW (ref 4.0–10.5)
nRBC: 0 % (ref 0.0–0.2)

## 2021-12-16 LAB — LACTATE DEHYDROGENASE: LDH: 172 U/L (ref 98–192)

## 2021-12-16 MED ORDER — DEXAMETHASONE 4 MG PO TABS
40.0000 mg | ORAL_TABLET | Freq: Once | ORAL | Status: AC
  Administered 2021-12-16: 40 mg via ORAL
  Filled 2021-12-16: qty 10

## 2021-12-16 MED ORDER — MONTELUKAST SODIUM 10 MG PO TABS
10.0000 mg | ORAL_TABLET | Freq: Once | ORAL | Status: AC
  Administered 2021-12-16: 10 mg via ORAL
  Filled 2021-12-16: qty 1

## 2021-12-16 MED ORDER — ACETAMINOPHEN 325 MG PO TABS
650.0000 mg | ORAL_TABLET | Freq: Once | ORAL | Status: AC
  Administered 2021-12-16: 650 mg via ORAL
  Filled 2021-12-16: qty 2

## 2021-12-16 MED ORDER — DIPHENHYDRAMINE HCL 25 MG PO CAPS
50.0000 mg | ORAL_CAPSULE | Freq: Once | ORAL | Status: AC
  Administered 2021-12-16: 50 mg via ORAL
  Filled 2021-12-16: qty 2

## 2021-12-16 MED ORDER — DARATUMUMAB-HYALURONIDASE-FIHJ 1800-30000 MG-UT/15ML ~~LOC~~ SOLN
1800.0000 mg | Freq: Once | SUBCUTANEOUS | Status: AC
  Administered 2021-12-16: 1800 mg via SUBCUTANEOUS
  Filled 2021-12-16: qty 15

## 2021-12-16 NOTE — Patient Instructions (Signed)
[facility] ONCOLOGY  Discharge Instructions: Thank you for choosing [facility] to provide your oncology and hematology care.   If you have a lab appointment with the [facility], please go directly to the [facility] and check in at the registration area.   Wear comfortable clothing and clothing appropriate for easy access to any Portacath or PICC line.   We strive to give you quality time with your provider. You may need to reschedule your appointment if you arrive late (15 or more minutes).  Arriving late affects you and other patients whose appointments are after yours.  Also, if you miss three or more appointments without notifying the office, you may be dismissed from the clinic at the providers discretion.      For prescription refill requests, have your pharmacy contact our office and allow 72 hours for refills to be completed.    Today you received the following chemotherapy and/or immunotherapy agent: Darzalex faspro    To help prevent nausea and vomiting after your treatment, we encourage you to take your nausea medication as directed.  BELOW ARE SYMPTOMS THAT SHOULD BE REPORTED IMMEDIATELY: *FEVER GREATER THAN 100.4 F (38 C) OR HIGHER *CHILLS OR SWEATING *NAUSEA AND VOMITING THAT IS NOT CONTROLLED WITH YOUR NAUSEA MEDICATION *UNUSUAL SHORTNESS OF BREATH *UNUSUAL BRUISING OR BLEEDING *URINARY PROBLEMS (pain or burning when urinating, or frequent urination) *BOWEL PROBLEMS (unusual diarrhea, constipation, pain near the anus) TENDERNESS IN MOUTH AND THROAT WITH OR WITHOUT PRESENCE OF ULCERS (sore throat, sores in mouth, or a toothache) UNUSUAL RASH, SWELLING OR PAIN  UNUSUAL VAGINAL DISCHARGE OR ITCHING   Items with * indicate a potential emergency and should be followed up as soon as possible or go to the Emergency Department if any problems should occur.  Please show the CHEMOTHERAPY ALERT CARD or IMMUNOTHERAPY ALERT CARD at check-in  to the Emergency Department and triage nurse.  Should you have questions after your visit or need to cancel or reschedule your appointment, please contact [facility]  Dept: [phone]  and follow the prompts.  Office hours are 8:00 a.m. to 4:30 p.m. Monday - Friday. Please note that voicemails left after 4:00 p.m. may not be returned until the following business day.  We are closed weekends and major holidays. You have access to a nurse at all times for urgent questions. Please call the main number to the clinic Dept: [phone] and follow the prompts.   For any non-urgent questions, you may also contact your provider using MyChart. We now offer e-Visits for anyone 56 and older to request care online for non-urgent symptoms. For details visit [email or web address].   Also download the MyChart app! Go to the app store, search "MyChart", open the app, select [facility], and log in with your MyChart username and password.  Due to Covid, a mask is required upon entering the hospital/clinic. If you do not have a mask, one will be given to you upon arrival. For doctor visits, patients may have 1 support person aged 73 or older with them. For treatment visits, patients cannot have anyone with them due to current Covid guidelines and our immunocompromised population.

## 2021-12-16 NOTE — Progress Notes (Signed)
Ok to change Dexamethasone on treatment plan from iv to po per dr. Lorenso Courier. Also ok to treat today with current labs per Dr. Lorenso Courier.

## 2021-12-16 NOTE — Progress Notes (Signed)
Patient tolerated darzalex faspro injection well. Monitored for 2 hour post-observation period without incident. VSS, ambulatory to lobby.

## 2021-12-16 NOTE — Progress Notes (Signed)
Nutrition Follow-up: ° °Patient with multiple myeloma with plasmacytoma. She is currently receiving Dara/Rev/Dex ° °Met with patient during infusion. She reports her appetite has improved and eating better. She continues to have metallic taste, but this is slowly improving. Patient is unable to eat sweet foods. Patient reports eating small amounts of food throughout the day. She is unable to eat much at one time due to early satiety. Patient continues to have constipation. She takes Senokot daily as well as Milk of Magnesia q3 days which typically works well for her. Patient reports no bowel movement in 4 days. She took milk of magnesia yesterday afternoon without relief. Patient will take additional dose tonight. ° °Medications: reviewed  ° °Labs: K 3.3, Glucose 104 ° °Anthropometrics: Weight 187 lb today  ° °1/20 - 188.5 lb °1/13 - 191 lb 6.4 oz  ° °NUTRITION DIAGNOSIS: Inadequate oral intake continues  ° ° °INTERVENTION:  °Encouraged high calorie high protein foods for weight maintenance °Continue baking soda salt water rinses several times daily °Continue drinking nutrition supplement, recommend pt switch to Ensure Plus/equivalent for more calories  °Continue bowel regimen per MD, suggested pt could try 4 oz prune juice daily °  ° °MONITORING, EVALUATION, GOAL: weight trends, intake  ° °NEXT VISIT: Friday February 24 during infusion ° ° ° °

## 2021-12-19 ENCOUNTER — Telehealth: Payer: Self-pay

## 2021-12-19 LAB — KAPPA/LAMBDA LIGHT CHAINS
Kappa free light chain: 36.4 mg/L — ABNORMAL HIGH (ref 3.3–19.4)
Kappa, lambda light chain ratio: 3.11 — ABNORMAL HIGH (ref 0.26–1.65)
Lambda free light chains: 11.7 mg/L (ref 5.7–26.3)

## 2021-12-19 NOTE — Telephone Encounter (Signed)
Carol [name] states that she is doing well. She felt a little light headed Friday evening and woke up Saturday feeling fine. She is eating and drinking and urinating well.  States that the injection site looks fine. She knows to call [phone] if she has any questions or concerns.

## 2021-12-19 NOTE — Telephone Encounter (Signed)
-----   Message from Anastasia Pall, RN sent at 12/16/2021  3:58 PM EST ----- Regarding: 1st time darzalex faspro follow-up call, being seen by Dr. Lorenso Courier. Patient received 1st time Darzalex Faspro injection today. Tolerated treatment well. Being seen by Dr. Lorenso Courier.

## 2021-12-20 LAB — MULTIPLE MYELOMA PANEL, SERUM
Albumin SerPl Elph-Mcnc: 3.7 g/dL (ref 2.9–4.4)
Albumin/Glob SerPl: 1.7 (ref 0.7–1.7)
Alpha 1: 0.3 g/dL (ref 0.0–0.4)
Alpha2 Glob SerPl Elph-Mcnc: 0.6 g/dL (ref 0.4–1.0)
B-Globulin SerPl Elph-Mcnc: 1 g/dL (ref 0.7–1.3)
Gamma Glob SerPl Elph-Mcnc: 0.3 g/dL — ABNORMAL LOW (ref 0.4–1.8)
Globulin, Total: 2.2 g/dL (ref 2.2–3.9)
IgA: 20 mg/dL — ABNORMAL LOW (ref 87–352)
IgG (Immunoglobin G), Serum: 322 mg/dL — ABNORMAL LOW (ref 586–1602)
IgM (Immunoglobulin M), Srm: 10 mg/dL — ABNORMAL LOW (ref 26–217)
Total Protein ELP: 5.9 g/dL — ABNORMAL LOW (ref 6.0–8.5)

## 2021-12-22 ENCOUNTER — Other Ambulatory Visit: Payer: Self-pay | Admitting: Physician Assistant

## 2021-12-22 ENCOUNTER — Telehealth: Payer: Self-pay

## 2021-12-22 DIAGNOSIS — C9000 Multiple myeloma not having achieved remission: Secondary | ICD-10-CM

## 2021-12-22 NOTE — Telephone Encounter (Signed)
Patient called stating that she is feeling weak, unsteady on her feet, light headed with extreme skin sensitivity after her tx on 12/16/21. She is scheduled for labs and an appointment with Dede Query PA on 12/23/21 prior to infusion that day. Patient is using PRN medications as prescribed for symptom management. Patient reports feeling well enough to come in for labs and evaluation on 12/23/21 prior to infusion. She understands that she can call with further questions/concerns.

## 2021-12-23 ENCOUNTER — Inpatient Hospital Stay: Payer: BC Managed Care – PPO | Attending: Radiation Oncology

## 2021-12-23 ENCOUNTER — Inpatient Hospital Stay: Payer: BC Managed Care – PPO

## 2021-12-23 ENCOUNTER — Other Ambulatory Visit: Payer: Self-pay

## 2021-12-23 ENCOUNTER — Other Ambulatory Visit: Payer: Self-pay | Admitting: Physician Assistant

## 2021-12-23 ENCOUNTER — Inpatient Hospital Stay (HOSPITAL_BASED_OUTPATIENT_CLINIC_OR_DEPARTMENT_OTHER): Payer: BC Managed Care – PPO | Admitting: Physician Assistant

## 2021-12-23 VITALS — BP 120/74 | HR 86 | Temp 97.0°F | Resp 18 | Wt 185.3 lb

## 2021-12-23 VITALS — BP 115/63 | HR 78 | Resp 16

## 2021-12-23 DIAGNOSIS — C9000 Multiple myeloma not having achieved remission: Secondary | ICD-10-CM

## 2021-12-23 DIAGNOSIS — Z5112 Encounter for antineoplastic immunotherapy: Secondary | ICD-10-CM | POA: Diagnosis present

## 2021-12-23 DIAGNOSIS — Z79899 Other long term (current) drug therapy: Secondary | ICD-10-CM | POA: Diagnosis not present

## 2021-12-23 DIAGNOSIS — C9030 Solitary plasmacytoma not having achieved remission: Secondary | ICD-10-CM | POA: Diagnosis not present

## 2021-12-23 LAB — CBC WITH DIFFERENTIAL (CANCER CENTER ONLY)
Abs Immature Granulocytes: 0.01 10*3/uL (ref 0.00–0.07)
Basophils Absolute: 0 10*3/uL (ref 0.0–0.1)
Basophils Relative: 1 %
Eosinophils Absolute: 0 10*3/uL (ref 0.0–0.5)
Eosinophils Relative: 0 %
HCT: 35.9 % — ABNORMAL LOW (ref 36.0–46.0)
Hemoglobin: 12.1 g/dL (ref 12.0–15.0)
Immature Granulocytes: 1 %
Lymphocytes Relative: 21 %
Lymphs Abs: 0.4 10*3/uL — ABNORMAL LOW (ref 0.7–4.0)
MCH: 30.9 pg (ref 26.0–34.0)
MCHC: 33.7 g/dL (ref 30.0–36.0)
MCV: 91.8 fL (ref 80.0–100.0)
Monocytes Absolute: 0.3 10*3/uL (ref 0.1–1.0)
Monocytes Relative: 14 %
Neutro Abs: 1.1 10*3/uL — ABNORMAL LOW (ref 1.7–7.7)
Neutrophils Relative %: 63 %
Platelet Count: 111 10*3/uL — ABNORMAL LOW (ref 150–400)
RBC: 3.91 MIL/uL (ref 3.87–5.11)
RDW: 17 % — ABNORMAL HIGH (ref 11.5–15.5)
WBC Count: 1.8 10*3/uL — ABNORMAL LOW (ref 4.0–10.5)
nRBC: 0 % (ref 0.0–0.2)

## 2021-12-23 LAB — CMP (CANCER CENTER ONLY)
ALT: 26 U/L (ref 0–44)
AST: 18 U/L (ref 15–41)
Albumin: 4.2 g/dL (ref 3.5–5.0)
Alkaline Phosphatase: 83 U/L (ref 38–126)
Anion gap: 9 (ref 5–15)
BUN: 11 mg/dL (ref 8–23)
CO2: 27 mmol/L (ref 22–32)
Calcium: 9.2 mg/dL (ref 8.9–10.3)
Chloride: 104 mmol/L (ref 98–111)
Creatinine: 0.95 mg/dL (ref 0.44–1.00)
GFR, Estimated: >60 mL/min (ref >60)
Glucose, Bld: 148 mg/dL — ABNORMAL HIGH (ref 70–99)
Potassium: 3.4 mmol/L — ABNORMAL LOW (ref 3.5–5.1)
Sodium: 140 mmol/L (ref 135–145)
Total Bilirubin: 1.4 mg/dL — ABNORMAL HIGH (ref 0.3–1.2)
Total Protein: 6.3 g/dL — ABNORMAL LOW (ref 6.5–8.1)

## 2021-12-23 LAB — LACTATE DEHYDROGENASE: LDH: 142 U/L (ref 98–192)

## 2021-12-23 MED ORDER — MONTELUKAST SODIUM 10 MG PO TABS
10.0000 mg | ORAL_TABLET | Freq: Once | ORAL | Status: AC
  Administered 2021-12-23: 10 mg via ORAL
  Filled 2021-12-23: qty 1

## 2021-12-23 MED ORDER — ACETAMINOPHEN 325 MG PO TABS
650.0000 mg | ORAL_TABLET | Freq: Once | ORAL | Status: AC
  Administered 2021-12-23: 650 mg via ORAL
  Filled 2021-12-23: qty 2

## 2021-12-23 MED ORDER — DARATUMUMAB-HYALURONIDASE-FIHJ 1800-30000 MG-UT/15ML ~~LOC~~ SOLN
1800.0000 mg | Freq: Once | SUBCUTANEOUS | Status: AC
  Administered 2021-12-23: 1800 mg via SUBCUTANEOUS
  Filled 2021-12-23: qty 15

## 2021-12-23 MED ORDER — DIPHENHYDRAMINE HCL 25 MG PO CAPS
50.0000 mg | ORAL_CAPSULE | Freq: Once | ORAL | Status: AC
  Administered 2021-12-23: 50 mg via ORAL
  Filled 2021-12-23: qty 2

## 2021-12-23 MED ORDER — DEXAMETHASONE 4 MG PO TABS
40.0000 mg | ORAL_TABLET | Freq: Once | ORAL | Status: AC
  Administered 2021-12-23: 40 mg via ORAL

## 2021-12-23 MED ORDER — DEXAMETHASONE 20 MG PO TABS: 40.0000 mg | ORAL_TABLET | ORAL | 3 refills | Status: DC

## 2021-12-23 NOTE — Progress Notes (Signed)
Patient tolerated second Darzalex Faspro injection well. Monitored for 1 hour post-infusion without incident. VSS, ambulatory to lobby.

## 2021-12-23 NOTE — Patient Instructions (Signed)
[facility] ONCOLOGY  Discharge Instructions: Thank you for choosing [facility] to provide your oncology and hematology care.   If you have a lab appointment with the [facility], please go directly to the [facility] and check in at the registration area.   Wear comfortable clothing and clothing appropriate for easy access to any Portacath or PICC line.   We strive to give you quality time with your provider. You may need to reschedule your appointment if you arrive late (15 or more minutes).  Arriving late affects you and other patients whose appointments are after yours.  Also, if you miss three or more appointments without notifying the office, you may be dismissed from the clinic at the providers discretion.      For prescription refill requests, have your pharmacy contact our office and allow 72 hours for refills to be completed.    Today you received the following chemotherapy and/or immunotherapy agent: Darzalex faspro    To help prevent nausea and vomiting after your treatment, we encourage you to take your nausea medication as directed.  BELOW ARE SYMPTOMS THAT SHOULD BE REPORTED IMMEDIATELY: *FEVER GREATER THAN 100.4 F (38 C) OR HIGHER *CHILLS OR SWEATING *NAUSEA AND VOMITING THAT IS NOT CONTROLLED WITH YOUR NAUSEA MEDICATION *UNUSUAL SHORTNESS OF BREATH *UNUSUAL BRUISING OR BLEEDING *URINARY PROBLEMS (pain or burning when urinating, or frequent urination) *BOWEL PROBLEMS (unusual diarrhea, constipation, pain near the anus) TENDERNESS IN MOUTH AND THROAT WITH OR WITHOUT PRESENCE OF ULCERS (sore throat, sores in mouth, or a toothache) UNUSUAL RASH, SWELLING OR PAIN  UNUSUAL VAGINAL DISCHARGE OR ITCHING   Items with * indicate a potential emergency and should be followed up as soon as possible or go to the Emergency Department if any problems should occur.  Please show the CHEMOTHERAPY ALERT CARD or IMMUNOTHERAPY ALERT CARD at check-in  to the Emergency Department and triage nurse.  Should you have questions after your visit or need to cancel or reschedule your appointment, please contact [facility]  Dept: [phone]  and follow the prompts.  Office hours are 8:00 a.m. to 4:30 p.m. Monday - Friday. Please note that voicemails left after 4:00 p.m. may not be returned until the following business day.  We are closed weekends and major holidays. You have access to a nurse at all times for urgent questions. Please call the main number to the clinic Dept: [phone] and follow the prompts.   For any non-urgent questions, you may also contact your provider using MyChart. We now offer e-Visits for anyone 29 and older to request care online for non-urgent symptoms. For details visit [email or web address].   Also download the MyChart app! Go to the app store, search "MyChart", open the app, select [facility], and log in with your MyChart username and password.  Due to Covid, a mask is required upon entering the hospital/clinic. If you do not have a mask, one will be given to you upon arrival. For doctor visits, patients may have 1 support person aged 48 or older with them. For treatment visits, patients cannot have anyone with them due to current Covid guidelines and our immunocompromised population.

## 2021-12-23 NOTE — Progress Notes (Signed)
Ok to treat with ANC of 1.1 per Dede Query, PA

## 2021-12-26 ENCOUNTER — Telehealth: Payer: Self-pay | Admitting: *Deleted

## 2021-12-26 NOTE — Telephone Encounter (Signed)
Received call from pt. She states that she is having similar side effects from the Daratumumab as she did the week before with skin sensitivity, shaky and lightheaded. She is asking if she is supposed to be taking extra steroids for this.  Advised that her dexamethasone is her usual weekly does of 40 mg on the day of her Dara. Advised that no extra steroids have been ordered. Advised by Dede Query, PA to have pt take Benadryl and tylenol for the current side effects to see if that will help.  Pt states she took the tylenol but not the benadryl. Reminded pt that the benadryl may make her a bit sleepy. Advised pt to call back if this does not help her symptoms. Pt voiced understanding to the above.

## 2021-12-27 ENCOUNTER — Encounter: Payer: Self-pay | Admitting: Hematology and Oncology

## 2021-12-27 NOTE — Progress Notes (Signed)
[place] [phone]   [phone]  PROGRESS NOTE  Patient Care Team: [name] as PCP - General (Physician Assistant)  Hematological/Oncological History # Plasmacytoma with Multiple Myeloma  07/29/2021: CT cervical spine shows expansile lytic lesion which almost completely replaces the normal C2 vertebral body  08/24/2021: PET CT scan shows increased metabolic activity at C2 and C7  08/25/2021: biopsy of C2 lesion showed finding consistent with plasma cell neoplasm.  09/08/2021: start radiation therapy 09/12/2021: establish care with Dr. Lorenso Courier  09/28/2021: end radiation therapy 10/10/2021: Bone marrow biopsy. Results confirm plasma cell neoplasm consistent with multiple myeloma, kappa restricted.  10/26/2021: Cycle 1 Day 1 of VRD 11/18/2021: Cycle 2 Day 1 of VRD 11/25/2021: Velcade HELD due to full body rash. Resolved with steroid therapy.  12/16/2021: Cycle 1 Day 1 of Dara/Rev/Dex  Interval History:  Carol [name] 66 y.o. female with medical history significant for multiple myeloma with plasmacytoma who presents for a follow up visit. The patient's last visit was on 12/09/2021. In the interim she completed Cycle 1, Day 1 of Dara/Rev/Dex.   On exam today Mrs. Midkiff reports that starting Tuesday of this week, she became extremely fatigued which only started to improve today. She continues to work full time and completes her daily activities on her own. She adds that she needed to rest frequent those days. She had decreased appetite due to metallic taste in  her mouth. She denies nausea, vomiting or abdominal ain. She experienced skin sensitivity that was exacerbated with showering. She denies any rash. She denies any new lotions/shampoos/detergents. She reports chronic constipation that improves with milk of magnesia every 2 days. She denies any easy bruising or signs of bleeding. She reports feeling dizzy when she stands up. She denies any syncopal  episodes. She denies fevers, chills, night sweats, shortness of breath, chest pain or cough. She has no other complaints. Rest of the 10 point ROS is below.  MEDICAL HISTORY:  Past Medical History:  Diagnosis Date   Allergy    Ankle edema    both ankles   Arthritis    hands,knees,elbows   Cancer ([place]) 2016   tumor kidney   Diabetes mellitus    type 2    Eczema    History of kidney stones    Hypothyroidism    Obesity    Sleep apnea    cpap machine setting of 3   Thyroid disease     SURGICAL HISTORY: Past Surgical History:  Procedure Laterality Date   BREAST BIOPSY Right 2001   CHOLECYSTECTOMY  2009   open   COLONOSCOPY     CYSTOSCOPY W/ RETROGRADES Left 11/22/2018   Procedure: CYSTOSCOPY WITH RETROGRADE PYELOGRAM;  Surgeon: Ardis Hughs, MD;  Location: [facility];  Service: Urology;  Laterality: Left;   CYSTOSCOPY WITH RETROGRADE PYELOGRAM, URETEROSCOPY AND STENT PLACEMENT Right 10/03/2021   Procedure: CYSTOSCOPY WITH RETROGRADE PYELOGRAM, URETEROSCOPY,STONE EXTRACTION AND STENT PLACEMENT;  Surgeon: Ceasar Mons, MD;  Location: [facility];  Service: Urology;  Laterality: Right;   DILATATION & CURRETTAGE/HYSTEROSCOPY WITH RESECTOCOPE N/A 01/21/2013   Procedure: DILATATION & CURETTAGE/HYSTEROSCOPY WITH RESECTOCOPE AND RESECTION OF ENDOMETRIAL;  Surgeon: Selinda Orion, MD;  Location: [facility];  Service: Gynecology;  Laterality: N/A;   KNEE ARTHROSCOPY     left    POLYPECTOMY     POSTERIOR CERVICAL FUSION/FORAMINOTOMY N/A 08/25/2021   Procedure: Occiput to Cervical 5 Posterior cervical instrumented fusion with open biopsy of Cervical 2  Mass. Cervical Two Ganglionectomy;  Surgeon: Judith Part, MD;  Location: [facility];  Service: Neurosurgery;  Laterality: N/A;   ROBOT ASSISTED PYELOPLASTY Left 11/22/2018   Procedure: XI ROBOTIC ASSISTED ATTEMPTED PYELOPLASTY CONVERTED TO NEPHRECTOMY/LYSIS OF ADHESIONS/UMBILICAL HERNIA REPAIR;  Surgeon: Ardis Hughs, MD;  Location: [facility];  Service: Urology;  Laterality: Left;   ROBOTIC ASSITED PARTIAL NEPHRECTOMY Left 06/11/2015   Procedure: LEFT ROBOTIC ASSISTED LAPAROSCOPIC  PARTIAL NEPHRECTOMY;  Surgeon: Ardis Hughs, MD;  Location: [facility];  Service: Urology;  Laterality: Left;   ROTATOR CUFF REPAIR Left 2011   URETERAL BIOPSY Left 02/14/2017   Procedure: URETERAL BIOPSY;  Surgeon: Ardis Hughs, MD;  Location: [facility];  Service: Urology;  Laterality: Left;   URETEROSCOPY WITH HOLMIUM LASER LITHOTRIPSY Left 02/14/2017   Procedure: URETEROSCOPY LITHOTRIPSY, URETERAL BALLOON DILATION;  Surgeon: Ardis Hughs, MD;  Location: [facility];  Service: Urology;  Laterality: Left;  With STENT    SOCIAL HISTORY: Social History   Socioeconomic History   Marital status: Married    Spouse name: Not on file   Number of children: Not on file   Years of education: Not on file   Highest education level: Not on file  Occupational History   Not on file  Tobacco Use   Smoking status: Never   Smokeless tobacco: Never  Vaping Use   Vaping Use: Never used  Substance and Sexual Activity   Alcohol use: Yes    Comment: occ   Drug use: No   Sexual activity: Not on file  Other Topics Concern   Not on file  Social History Narrative   Not on file   Social Determinants of Health   Financial Resource Strain: Not on file  Food Insecurity: Not on file  Transportation Needs: Not on file  Physical Activity: Not on file  Stress: Not on file  Social Connections: Not on file  Intimate Partner Violence: Not on file    FAMILY HISTORY: Family History  Problem Relation Age of Onset   Cancer Father     ALLERGIES:  is allergic to morphine.  MEDICATIONS:  Current Outpatient Medications  Medication Sig Dispense Refill   acetaminophen (TYLENOL) 500 MG tablet Take 1,000 mg by mouth every 6 (six) hours as needed for mild pain.     acyclovir (ZOVIRAX) 400 MG tablet Take 1 tablet (400 mg total) by  mouth 2 (two) times daily. 60 tablet 3   allopurinol (ZYLOPRIM) 300 MG tablet Take 1 tablet (300 mg total) by mouth daily. 90 tablet 1   Ascorbic Acid (VITAMIN C ADULT GUMMIES PO) Take 1 tablet by mouth daily.     aspirin EC 81 MG tablet Take 1 tablet (81 mg total) by mouth daily. Swallow whole. 90 tablet 3   atorvastatin (LIPITOR) 20 MG tablet Take 20 mg by mouth at bedtime.     Cholecalciferol (VITAMIN D3) 25 MCG (1000 UT) CAPS Take 1,000 Units by mouth daily.     furosemide (LASIX) 20 MG tablet Take 20 mg by mouth daily as needed for fluid or edema.     levothyroxine (SYNTHROID) 50 MCG tablet Take 50 mcg by mouth daily before breakfast.     Magnesium Hydroxide (MILK OF MAGNESIA PO) Take by mouth. 2 times a week     metFORMIN (GLUCOPHAGE) 500 MG tablet Take 500 mg by mouth daily with breakfast.     nystatin (MYCOSTATIN) 100000 UNIT/ML suspension Take 5 mLs (500,000 Units total) by mouth 4 (four)  times daily. 60 mL 0   ondansetron (ZOFRAN ODT) 4 MG disintegrating tablet Take 1 tablet (4 mg total) by mouth every 8 (eight) hours as needed for nausea or vomiting. 30 tablet 0   ondansetron (ZOFRAN) 8 MG tablet Take 1 tablet (8 mg total) by mouth every 8 (eight) hours as needed. 30 tablet 0   OZEMPIC, 0.25 OR 0.5 MG/DOSE, 2 MG/1.5ML SOPN Inject 0.5 mg into the skin every Saturday.     PROAIR HFA 108 (90 BASE) MCG/ACT inhaler Inhale 2 puffs into the lungs every 4 (four) hours as needed for wheezing or shortness of breath. Uses seasonal  3   prochlorperazine (COMPAZINE) 10 MG tablet Take 1 tablet (10 mg total) by mouth every 6 (six) hours as needed for nausea or vomiting. 30 tablet 0   REVLIMID 25 MG capsule TAKE 1 CAPSULE BY MOUTH ONCE DAILY FOR 14 DAYS ON AND 7 DAYS OFF [employer] Auth #: [id number]  Date obtained: 12/14/21 14 capsule 0   Sennosides (SENOKOT PO) Take by mouth. Bid     HEMADY 20 MG TABS TAKE 40 MG BY MOUTH ONCE A WEEK. 2 TABLETS ON DAY OF CHEMO TREATMENT. 60 tablet 3   No current  facility-administered medications for this visit.    REVIEW OF SYSTEMS:   Constitutional: ( - ) fevers, ( - )  chills , ( - ) night sweats Eyes: ( - ) blurriness of vision, ( - ) double vision, ( - ) watery eyes Ears, nose, mouth, throat, and face: ( - ) mucositis, ( - ) sore throat Respiratory: ( - ) cough, ( - ) dyspnea, ( - ) wheezes Cardiovascular: ( - ) palpitation, ( - ) chest discomfort, ( - ) lower extremity swelling Gastrointestinal:  ( - ) nausea, ( - ) heartburn, ( -) change in bowel habits Skin: ( - ) abnormal skin rashes Lymphatics: ( - ) new lymphadenopathy, ( - ) easy bruising Neurological: ( - ) numbness, ( - ) tingling, ( - ) new weaknesses Behavioral/Psych: ( - ) mood change, ( - ) new changes  All other systems were reviewed with the patient and are negative.  PHYSICAL EXAMINATION: ECOG PERFORMANCE STATUS: 1 - Symptomatic but completely ambulatory  Vitals:   12/23/21 1241  BP: 120/74  Pulse: 86  Resp: 18  Temp: (!) 97 F (36.1 C)  SpO2: 98%    Filed Weights   12/23/21 1241  Weight: 185 lb 4.8 oz (84.1 kg)    GENERAL: Well-appearing middle-age Caucasian female alert, no distress and comfortable SKIN: No evidence of rash, erythema, dry skin, or raised lesions. EYES: conjunctiva are pink and non-injected, sclera clear LUNGS: clear to auscultation and percussion with normal breathing effort HEART: regular rate & rhythm and no murmurs and no lower extremity edema Musculoskeletal: no cyanosis of digits and no clubbing  PSYCH: alert & oriented x 3, fluent speech NEURO: no focal motor/sensory deficits  LABORATORY DATA:  I have reviewed the data as listed CBC Latest Ref Rng & Units 12/23/2021 12/16/2021 12/09/2021  WBC 4.0 - 10.5 K/uL 1.8(L) 1.7(L) 2.0(L)  Hemoglobin 12.0 - 15.0 g/dL 12.1 12.1 11.5(L)  Hematocrit 36.0 - 46.0 % 35.9(L) 35.1(L) 33.4(L)  Platelets 150 - 400 K/uL 111(L) 122(L) 115(L)    CMP Latest Ref Rng & Units 12/23/2021 12/16/2021 12/09/2021   Glucose 70 - 99 mg/dL 148(H) 104(H) 108(H)  BUN 8 - 23 mg/dL '11 13 17  ' Creatinine 0.44 - 1.00 mg/dL 0.95 0.95 0.95  Sodium  135 - 145 mmol/L 140 141 141  Potassium 3.5 - 5.1 mmol/L 3.4(L) 3.3(L) 3.5  Chloride 98 - 111 mmol/L 104 102 105  CO2 22 - 32 mmol/L '27 30 26  ' Calcium 8.9 - 10.3 mg/dL 9.2 9.4 9.5  Total Protein 6.5 - 8.1 g/dL 6.3(L) 6.4(L) 6.2(L)  Total Bilirubin 0.3 - 1.2 mg/dL 1.4(H) 1.4(H) 1.1  Alkaline Phos 38 - 126 U/L 83 87 74  AST 15 - 41 U/L '18 25 27  ' ALT 0 - 44 U/L 26 32 35    Lab Results  Component Value Date   MPROTEIN Not Observed 12/16/2021   MPROTEIN Not Observed 12/09/2021   MPROTEIN Not Observed 11/11/2021   Lab Results  Component Value Date   KPAFRELGTCHN 36.4 (H) 12/16/2021   KPAFRELGTCHN 32.6 (H) 12/09/2021   KPAFRELGTCHN 120.7 (H) 11/11/2021   LAMBDASER 11.7 12/16/2021   LAMBDASER 9.3 12/09/2021   LAMBDASER 17.6 11/11/2021   KAPLAMBRATIO 3.11 (H) 12/16/2021   KAPLAMBRATIO 3.51 (H) 12/09/2021   KAPLAMBRATIO 6.86 (H) 11/11/2021    RADIOGRAPHIC STUDIES: No results found.  ASSESSMENT & PLAN Carol [name] 66 y.o. female with medical history significant for multiple myeloma with plasmacytoma who presents for a follow up visit.   # Free Kappa Multiple Myeloma # Plasmacytoma  -- original finding of L2 plasmacytoma on biopsy of back lesion, bone marrow biopsy confirms greater than 60% plasma cells consistent with multiple myeloma. --Cycle 1 Day 1 of VRD treatment started on 10/26/2021.  --VRD therapy stopped on 11/25/2021 due to rash. Started Dara/Rev/Dex on 12/16/2021.  --will order monthly SPEP, UPEP, SFLC and beta 2 microglobulin --weekly CBC, CMP, and LDH Plan:  --Labs today were reviewed and adequate for treatment today.  --Proceed with Cycle 1, Day 8 of Dara/Rev/Dex today --RTC in 2 weeks with interval weekly treatment.   #Leukopenia/Thrombocytopenia --WBC 1.8, stable since last week. Platelet count 111K, stable.  --continue to monitor.    #Rash--resolved --Present for approximately one week --Received IV solumedrol and medrol dose pak.  --Suspect drug induced (velcade)  #Thrush: --Currently on Nystatin mouthwash with improvement.  --Continue to improve.   #Skin sensitivity/dizziness/fatigue: --Likely secondary to treatment. --Recommend to take Tylenol and Benadryl  #Supportive Care -- chemotherapy education complete  -- port placement not required -- zofran 69m q8H PRN and compazine 175mPO q6H for nausea -- acyclovir 40082mO BID for VCZ prophylaxis -- allopurinol 300m13m daily for TLS prophylaxis -- plan to start zometa after dental clearance is obtained. -- EMLA cream for port -- no pain medication required at this time.   No orders of the defined types were placed in this encounter.  All questions were answered. The patient knows to call the clinic with any problems, questions or concerns.  I have spent a total of 30 minutes minutes of face-to-face and non-face-to-face time, preparing to see the patient, performing a medically appropriate examination, counseling and educating the patient, ordering medications/tests, documenting clinical information in the electronic health record, and care coordination.   IrenDede QueryC [facility] [place]: [phone] [id number] 9:31 AM

## 2021-12-30 ENCOUNTER — Other Ambulatory Visit: Payer: Self-pay

## 2021-12-30 ENCOUNTER — Inpatient Hospital Stay: Payer: BC Managed Care – PPO

## 2021-12-30 ENCOUNTER — Inpatient Hospital Stay: Payer: BC Managed Care – PPO | Admitting: Hematology and Oncology

## 2021-12-30 ENCOUNTER — Ambulatory Visit: Payer: BC Managed Care – PPO | Admitting: Hematology and Oncology

## 2021-12-30 DIAGNOSIS — C9000 Multiple myeloma not having achieved remission: Secondary | ICD-10-CM

## 2021-12-30 DIAGNOSIS — C9030 Solitary plasmacytoma not having achieved remission: Secondary | ICD-10-CM | POA: Diagnosis not present

## 2021-12-30 LAB — CMP (CANCER CENTER ONLY)
ALT: 19 U/L (ref 0–44)
AST: 15 U/L (ref 15–41)
Albumin: 4.4 g/dL (ref 3.5–5.0)
Alkaline Phosphatase: 85 U/L (ref 38–126)
Anion gap: 9 (ref 5–15)
BUN: 16 mg/dL (ref 8–23)
CO2: 25 mmol/L (ref 22–32)
Calcium: 9.4 mg/dL (ref 8.9–10.3)
Chloride: 104 mmol/L (ref 98–111)
Creatinine: 0.86 mg/dL (ref 0.44–1.00)
GFR, Estimated: >60 mL/min (ref >60)
Glucose, Bld: 175 mg/dL — ABNORMAL HIGH (ref 70–99)
Potassium: 3.5 mmol/L (ref 3.5–5.1)
Sodium: 138 mmol/L (ref 135–145)
Total Bilirubin: 1.2 mg/dL (ref 0.3–1.2)
Total Protein: 6.4 g/dL — ABNORMAL LOW (ref 6.5–8.1)

## 2021-12-30 LAB — CBC WITH DIFFERENTIAL (CANCER CENTER ONLY)
Abs Immature Granulocytes: 0.01 10*3/uL (ref 0.00–0.07)
Basophils Absolute: 0 10*3/uL (ref 0.0–0.1)
Basophils Relative: 2 %
Eosinophils Absolute: 0 10*3/uL (ref 0.0–0.5)
Eosinophils Relative: 0 %
HCT: 37 % (ref 36.0–46.0)
Hemoglobin: 13 g/dL (ref 12.0–15.0)
Immature Granulocytes: 1 %
Lymphocytes Relative: 22 %
Lymphs Abs: 0.3 10*3/uL — ABNORMAL LOW (ref 0.7–4.0)
MCH: 31.2 pg (ref 26.0–34.0)
MCHC: 35.1 g/dL (ref 30.0–36.0)
MCV: 88.7 fL (ref 80.0–100.0)
Monocytes Absolute: 0.1 10*3/uL (ref 0.1–1.0)
Monocytes Relative: 9 %
Neutro Abs: 0.8 10*3/uL — ABNORMAL LOW (ref 1.7–7.7)
Neutrophils Relative %: 66 %
Platelet Count: 121 10*3/uL — ABNORMAL LOW (ref 150–400)
RBC: 4.17 MIL/uL (ref 3.87–5.11)
RDW: 17.1 % — ABNORMAL HIGH (ref 11.5–15.5)
WBC Count: 1.2 10*3/uL — ABNORMAL LOW (ref 4.0–10.5)
nRBC: 0 % (ref 0.0–0.2)

## 2021-12-30 LAB — LACTATE DEHYDROGENASE: LDH: 160 U/L (ref 98–192)

## 2022-01-02 NOTE — Progress Notes (Signed)
°[facility] °[phone]   [phone] ° °PROGRESS NOTE ° °Patient Care Team: °[name], Sarah B, PA-C as PCP - General (Physician Assistant) ° °Hematological/Oncological History °# Plasmacytoma with Multiple Myeloma  °07/29/2021: CT cervical spine shows expansile lytic lesion which almost °completely replaces the normal C2 vertebral body  °08/24/2021: PET CT scan shows increased metabolic activity at C2 and C7  °08/25/2021: biopsy of C2 lesion showed finding consistent with plasma cell neoplasm.  °09/08/2021: start radiation therapy °09/12/2021: establish care with Dr. Dorsey  °09/28/2021: end radiation therapy °10/10/2021: Bone marrow biopsy. Results confirm plasma cell neoplasm consistent with multiple myeloma, kappa restricted.  °10/26/2021: Cycle 1 Day 1 of VRD °11/18/2021: Cycle 2 Day 1 of VRD °11/25/2021: Velcade HELD due to full body rash. Resolved with steroid therapy.  °12/16/2021: Cycle 1 Day 1 of Dara/Rev/Dex °12/30/2021: HELD Daratumumab due to toxicity. Patient unable to tolerate. Requested holding the medication  ° °Interval History:  °Carol [name] 65 y.o. female with medical history significant for multiple myeloma with plasmacytoma who presents for a follow up visit. The patient's last visit was on 12/23/2021. In the interim she continued Dara/Rev/Dex.  ° °On exam today Carol [name] reports she has been struggling with his chemotherapy regimen.  She came in today hoping that we would discontinue the treatment.  She notes that she feels terrible Sunday through Wednesday after receiving treatment on Fridays.  She endorses a skin pain as well as weakness and not being able to eat.  She notes that she gets severe nausea with most foods and feels like she "cannot do anything".  She notes that she is tired of this medication and thinks that there needs to be a change in her regimen.  She denies fevers, chills, night sweats, shortness of breath, chest pain or cough. She has no other  complaints. Rest of the 10 point ROS is below. 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  ° Diabetes mellitus   ° type 2   ° Eczema   ° History of kidney stones   ° Hypothyroidism   ° Obesity   ° Sleep apnea   ° cpap machine setting of 3  ° Thyroid disease   ° ° °SURGICAL HISTORY: °Past Surgical History:  °Procedure Laterality Date  ° BREAST BIOPSY Right 2001  ° CHOLECYSTECTOMY  2009  ° open  ° COLONOSCOPY    ° CYSTOSCOPY W/ RETROGRADES Left 11/22/2018  ° Procedure: CYSTOSCOPY WITH RETROGRADE PYELOGRAM;  Surgeon: Herrick, Benjamin W, MD;  Location: [facility];  Service: Urology;  Laterality: Left;  ° CYSTOSCOPY WITH RETROGRADE PYELOGRAM, URETEROSCOPY AND STENT PLACEMENT Right 10/03/2021  ° Procedure: CYSTOSCOPY WITH RETROGRADE PYELOGRAM, URETEROSCOPY,STONE EXTRACTION AND STENT PLACEMENT;  Surgeon: Winter, Christopher Aaron, MD;  Location: [facility];  Service: Urology;  Laterality: Right;  ° DILATATION & CURRETTAGE/HYSTEROSCOPY WITH RESECTOCOPE N/A 01/21/2013  ° Procedure: DILATATION & CURETTAGE/HYSTEROSCOPY WITH RESECTOCOPE AND RESECTION OF ENDOMETRIAL;  Surgeon: Charles W Lomax, MD;  Location: [facility];  Service: Gynecology;  Laterality: N/A;  ° KNEE ARTHROSCOPY    ° left   ° POLYPECTOMY    ° POSTERIOR CERVICAL FUSION/FORAMINOTOMY N/A 08/25/2021  ° Procedure: Occiput to Cervical 5 Posterior cervical instrumented fusion with open biopsy of Cervical 2 Mass. Cervical Two Ganglionectomy;  Surgeon: Ostergard, Thomas A, MD;    MD;  Location: [facility];  Service: Neurosurgery;  Laterality: N/A;   ROBOT ASSISTED PYELOPLASTY Left 11/22/2018   Procedure: XI ROBOTIC ASSISTED ATTEMPTED PYELOPLASTY CONVERTED TO NEPHRECTOMY/LYSIS OF ADHESIONS/UMBILICAL HERNIA REPAIR;  Surgeon: Ardis Hughs, MD;  Location: [facility];  Service: Urology;  Laterality: Left;   ROBOTIC ASSITED PARTIAL NEPHRECTOMY Left  06/11/2015   Procedure: LEFT ROBOTIC ASSISTED LAPAROSCOPIC  PARTIAL NEPHRECTOMY;  Surgeon: Ardis Hughs, MD;  Location: [facility];  Service: Urology;  Laterality: Left;   ROTATOR CUFF REPAIR Left 2011   URETERAL BIOPSY Left 02/14/2017   Procedure: URETERAL BIOPSY;  Surgeon: Ardis Hughs, MD;  Location: [facility];  Service: Urology;  Laterality: Left;   URETEROSCOPY WITH HOLMIUM LASER LITHOTRIPSY Left 02/14/2017   Procedure: URETEROSCOPY LITHOTRIPSY, URETERAL BALLOON DILATION;  Surgeon: Ardis Hughs, MD;  Location: [facility];  Service: Urology;  Laterality: Left;  With STENT    SOCIAL HISTORY: Social History   Socioeconomic History   Marital status: Married    Spouse name: Not on file   Number of children: Not on file   Years of education: Not on file   Highest education level: Not on file  Occupational History   Not on file  Tobacco Use   Smoking status: Never   Smokeless tobacco: Never  Vaping Use   Vaping Use: Never used  Substance and Sexual Activity   Alcohol use: Yes    Comment: occ   Drug use: No   Sexual activity: Not on file  Other Topics Concern   Not on file  Social History Narrative   Not on file   Social Determinants of Health   Financial Resource Strain: Not on file  Food Insecurity: Not on file  Transportation Needs: Not on file  Physical Activity: Not on file  Stress: Not on file  Social Connections: Not on file  Intimate Partner Violence: Not on file    FAMILY HISTORY: Family History  Problem Relation Age of Onset   Cancer Father     ALLERGIES:  is allergic to morphine.  MEDICATIONS:  Current Outpatient Medications  Medication Sig Dispense Refill   acetaminophen (TYLENOL) 500 MG tablet Take 1,000 mg by mouth every 6 (six) hours as needed for mild pain.     acyclovir (ZOVIRAX) 400 MG tablet Take 1 tablet (400 mg total) by mouth 2 (two) times daily. 60 tablet 3   allopurinol (ZYLOPRIM) 300 MG tablet Take 1 tablet (300 mg total) by mouth  daily. 90 tablet 1   Ascorbic Acid (VITAMIN C ADULT GUMMIES PO) Take 1 tablet by mouth daily.     aspirin EC 81 MG tablet Take 1 tablet (81 mg total) by mouth daily. Swallow whole. 90 tablet 3   atorvastatin (LIPITOR) 20 MG tablet Take 20 mg by mouth at bedtime.     Cholecalciferol (VITAMIN D3) 25 MCG (1000 UT) CAPS Take 1,000 Units by mouth daily.     furosemide (LASIX) 20 MG tablet Take 20 mg by mouth daily as needed for fluid or edema.     HEMADY 20 MG TABS TAKE 40 MG BY MOUTH ONCE A WEEK. 2 TABLETS ON DAY OF CHEMO TREATMENT. 60 tablet 3   levothyroxine (SYNTHROID) 50 MCG tablet Take 50 mcg by mouth daily before breakfast.     Magnesium Hydroxide (MILK OF MAGNESIA PO) Take by mouth. 2 times a week     metFORMIN (GLUCOPHAGE) 500 MG tablet Take 500 mg by mouth daily with breakfast.  nystatin (MYCOSTATIN) 100000 UNIT/ML suspension Take 5 mLs (500,000 Units total) by mouth 4 (four) times daily. 60 mL 0   ondansetron (ZOFRAN ODT) 4 MG disintegrating tablet Take 1 tablet (4 mg total) by mouth every 8 (eight) hours as needed for nausea or vomiting. 30 tablet 0   ondansetron (ZOFRAN) 8 MG tablet Take 1 tablet (8 mg total) by mouth every 8 (eight) hours as needed. 30 tablet 0   OZEMPIC, 0.25 OR 0.5 MG/DOSE, 2 MG/1.5ML SOPN Inject 0.5 mg into the skin every Saturday.     PROAIR HFA 108 (90 BASE) MCG/ACT inhaler Inhale 2 puffs into the lungs every 4 (four) hours as needed for wheezing or shortness of breath. Uses seasonal  3   prochlorperazine (COMPAZINE) 10 MG tablet Take 1 tablet (10 mg total) by mouth every 6 (six) hours as needed for nausea or vomiting. 30 tablet 0   REVLIMID 25 MG capsule TAKE 1 CAPSULE BY MOUTH ONCE DAILY FOR 14 DAYS ON AND 7 DAYS OFF [employer] Auth #: [id number]  Date obtained: 12/14/21 14 capsule 0   Sennosides (SENOKOT PO) Take by mouth. Bid     No current facility-administered medications for this visit.    REVIEW OF SYSTEMS:   Constitutional: ( - ) fevers, ( - )  chills , ( -  ) night sweats Eyes: ( - ) blurriness of vision, ( - ) double vision, ( - ) watery eyes Ears, nose, mouth, throat, and face: ( - ) mucositis, ( - ) sore throat Respiratory: ( - ) cough, ( - ) dyspnea, ( - ) wheezes Cardiovascular: ( - ) palpitation, ( - ) chest discomfort, ( - ) lower extremity swelling Gastrointestinal:  ( - ) nausea, ( - ) heartburn, ( -) change in bowel habits Skin: ( - ) abnormal skin rashes Lymphatics: ( - ) new lymphadenopathy, ( - ) easy bruising Neurological: ( - ) numbness, ( - ) tingling, ( - ) new weaknesses Behavioral/Psych: ( - ) mood change, ( - ) new changes  All other systems were reviewed with the patient and are negative.  PHYSICAL EXAMINATION: ECOG PERFORMANCE STATUS: 1 - Symptomatic but completely ambulatory  There were no vitals filed for this visit.   There were no vitals filed for this visit.   GENERAL: Well-appearing middle-age Caucasian female alert, no distress and comfortable SKIN: No evidence of rash, erythema, dry skin, or raised lesions. EYES: conjunctiva are pink and non-injected, sclera clear LUNGS: clear to auscultation and percussion with normal breathing effort HEART: regular rate & rhythm and no murmurs and no lower extremity edema Musculoskeletal: no cyanosis of digits and no clubbing  PSYCH: alert & oriented x 3, fluent speech NEURO: no focal motor/sensory deficits  LABORATORY DATA:  I have reviewed the data as listed CBC Latest Ref Rng & Units 12/30/2021 12/23/2021 12/16/2021  WBC 4.0 - 10.5 K/uL 1.2(L) 1.8(L) 1.7(L)  Hemoglobin 12.0 - 15.0 g/dL 13.0 12.1 12.1  Hematocrit 36.0 - 46.0 % 37.0 35.9(L) 35.1(L)  Platelets 150 - 400 K/uL 121(L) 111(L) 122(L)    CMP Latest Ref Rng & Units 12/30/2021 12/23/2021 12/16/2021  Glucose 70 - 99 mg/dL 175(H) 148(H) 104(H)  BUN 8 - 23 mg/dL _0 Creatinine 0.44 - 1.00 mg/dL 0.86 0.95 0.95  Sodium 135 - 145 mmol/L 138 140 141  Potassium 3.5 - 5.1 mmol/L 3.5 3.4(L) 3.3(L)  Chloride 98 -  111 mmol/L 104 104 102  CO2 22 - 32 mmol/L 25 27  Calcium 8.9 - 10.3 mg/dL 9.4 9.2 9.4  °Total Protein 6.5 - 8.1 g/dL 6.4(L) 6.3(L) 6.4(L)  °Total Bilirubin 0.3 - 1.2 mg/dL 1.2 1.4(H) 1.4(H)  °Alkaline Phos 38 - 126 U/L 85 83 87  °AST 15 - 41 U/L 15 18 25  °ALT 0 - 44 U/L 19 26 32  ° ° °Lab Results  °Component Value Date  ° MPROTEIN Not Observed 12/16/2021  ° MPROTEIN Not Observed 12/09/2021  ° MPROTEIN Not Observed 11/11/2021  ° °Lab Results  °Component Value Date  ° KPAFRELGTCHN 36.4 (H) 12/16/2021  ° KPAFRELGTCHN 32.6 (H) 12/09/2021  ° KPAFRELGTCHN 120.7 (H) 11/11/2021  ° LAMBDASER 11.7 12/16/2021  ° LAMBDASER 9.3 12/09/2021  ° LAMBDASER 17.6 11/11/2021  ° KAPLAMBRATIO 3.11 (H) 12/16/2021  ° KAPLAMBRATIO 3.51 (H) 12/09/2021  ° KAPLAMBRATIO 6.86 (H) 11/11/2021  ° ° °RADIOGRAPHIC STUDIES: °No results found. ° °ASSESSMENT & PLAN °Carol [name] 65 y.o. female with medical history significant for multiple myeloma with plasmacytoma who presents for a follow up visit. °  °# Free Kappa Multiple Myeloma °# Plasmacytoma  °-- original finding of L2 plasmacytoma on biopsy of back lesion, bone marrow biopsy confirms greater than 60% plasma cells consistent with multiple myeloma. °--Cycle 1 Day 1 of VRD treatment started on 10/26/2021.  °--VRD therapy stopped on 11/25/2021 due to rash. Started Dara/Rev/Dex on 12/16/2021.  °--will order monthly SPEP, UPEP, SFLC and beta 2 microglobulin °--weekly CBC, CMP, and LDH °Plan:  °--Labs today were reviewed and adequate for treatment today.  °--HOLD Dara/Rev/Dex due to intolerance of Daratumumab °--plan to transition to Carfilzomib based regimen instead.  After discussion with the patient about the various regimens available she was agreeable to carfilzomib monotherapy, planning to start next Friday. °--RTC in 2 weeks with interval weekly treatment.  ° °#Leukopenia/Thrombocytopenia °--WBC 1.2, declining. Platelet count 121K, stable.  °--Likely secondary to Revlimid therapy.  This has  been discontinued. °--continue to monitor.  ° °#Rash--resolved °--Present for approximately one week °--Received IV solumedrol and medrol dose pak.  °--Suspect drug induced (velcade) ° °#Thrush: °--Currently on Nystatin mouthwash with improvement.  °--Continue to improve.  ° °#Skin sensitivity/dizziness/fatigue: °--Likely secondary to treatment. °--Recommend to take Tylenol and Benadryl ° °#Supportive Care °-- chemotherapy education complete  °-- port placement not required °-- zofran 8mg q8H PRN and compazine 10mg PO q6H for nausea °-- acyclovir 400mg PO BID for VCZ prophylaxis °-- allopurinol 300mg PO daily for TLS prophylaxis °-- plan to start zometa after dental clearance is obtained. °-- no pain medication required at this time.  ° °No orders of the defined types were placed in this encounter. ° °All questions were answered. The patient knows to call the clinic with any problems, questions or concerns. ° °I have spent a total of 30 minutes minutes of face-to-face and non-face-to-face time, preparing to see the patient, performing a medically appropriate examination, counseling and educating the patient, ordering medications/tests, documenting clinical information in the electronic health record, and care coordination.  ° °John T. Dorsey, MD °Department of Hematology/Oncology °[facility] °Phone: [phone] °Pager: [phone] °Email: [email or web address] ° ° °01/03/2022 5:23 PM ° °

## 2022-01-02 NOTE — Progress Notes (Signed)
DISCONTINUE ON PATHWAY REGIMEN - Multiple Myeloma and Other Plasma Cell Dyscrasias     Cycle 1 and 2: A cycle is every 28 days:     Lenalidomide      Dexamethasone      Daratumumab    Cycles 3 through 6: A cycle is every 28 days:     Lenalidomide      Dexamethasone      Daratumumab    Cycles 7 and beyond: A cycle is every 28 days:     Lenalidomide      Dexamethasone      Daratumumab   **Always confirm dose/schedule in your pharmacy ordering system**  REASON: Toxicities / Adverse Event PRIOR TREATMENT: [id number]: DaraRd (Daratumumab 16 mg/kg IV + Lenalidomide + Dexamethasone 40 mg PO/IV) q28 Days Until Progression or Unacceptable Toxicity TREATMENT RESPONSE: N/A - Adjuvant Therapy  START OFF PATHWAY REGIMEN - Multiple Myeloma and Other Plasma Cell Dyscrasias   [id number]:Carfilzomib 70 mg/m2 IV D1,15 + Lenalidomide 10 mg PO Daily D1-21 q28 Days:   A cycle is every 28 days:     Lenalidomide      Carfilzomib   **Always confirm dose/schedule in your pharmacy ordering system**  Patient Characteristics: Multiple Myeloma, Newly Diagnosed, Transplant Ineligible or Refused, Standard Risk Disease Classification: Multiple Myeloma R-ISS Staging: II Therapeutic Status: Newly Diagnosed Is Patient Eligible for Transplant<= Transplant Ineligible or Refused Risk Status: Standard Risk Intent of Therapy: Curative Intent, Discussed with Patient

## 2022-01-03 ENCOUNTER — Encounter: Payer: Self-pay | Admitting: Hematology and Oncology

## 2022-01-03 NOTE — Progress Notes (Signed)
DISCONTINUE OFF PATHWAY REGIMEN - Multiple Myeloma and Other Plasma Cell Dyscrasias   [id number]:Carfilzomib 70 mg/m2 IV D1,15 + Lenalidomide 10 mg PO Daily D1-21 q28 Days:   A cycle is every 28 days:     Lenalidomide      Carfilzomib   **Always confirm dose/schedule in your pharmacy ordering system**  REASON: Other Reason PRIOR TREATMENT: Off Pathway: Carfilzomib 70 mg/m2 IV D1,15 + Lenalidomide 10 mg PO Daily D1-21 q28 Days TREATMENT RESPONSE: Unable to Evaluate  START OFF PATHWAY REGIMEN - Multiple Myeloma and Other Plasma Cell Dyscrasias   [id number] - Weekly (Carfilzomib 20/70 mg/m2 + Dexamethasone 40 mg) q28 Days:   A cycle is every 28 days:     Carfilzomib      Carfilzomib      Carfilzomib      Dexamethasone      Dexamethasone   **Always confirm dose/schedule in your pharmacy ordering system**  Patient Characteristics: Multiple Myeloma, Newly Diagnosed, Transplant Ineligible or Refused, Standard Risk Disease Classification: Multiple Myeloma R-ISS Staging: II Therapeutic Status: Newly Diagnosed Is Patient Eligible for Transplant<= Transplant Ineligible or Refused Risk Status: Standard Risk Intent of Therapy: Curative Intent, Discussed with Patient

## 2022-01-04 ENCOUNTER — Other Ambulatory Visit (HOSPITAL_COMMUNITY): Payer: Self-pay

## 2022-01-06 ENCOUNTER — Inpatient Hospital Stay: Payer: BC Managed Care – PPO

## 2022-01-06 ENCOUNTER — Inpatient Hospital Stay (HOSPITAL_BASED_OUTPATIENT_CLINIC_OR_DEPARTMENT_OTHER): Payer: BC Managed Care – PPO | Admitting: Hematology and Oncology

## 2022-01-06 ENCOUNTER — Other Ambulatory Visit: Payer: Self-pay

## 2022-01-06 VITALS — BP 112/66 | HR 92 | Temp 97.9°F | Resp 16 | Wt 174.8 lb

## 2022-01-06 DIAGNOSIS — C9000 Multiple myeloma not having achieved remission: Secondary | ICD-10-CM

## 2022-01-06 DIAGNOSIS — C9030 Solitary plasmacytoma not having achieved remission: Secondary | ICD-10-CM | POA: Diagnosis not present

## 2022-01-06 LAB — CMP (CANCER CENTER ONLY)
ALT: 14 U/L (ref 0–44)
AST: 15 U/L (ref 15–41)
Albumin: 4.4 g/dL (ref 3.5–5.0)
Alkaline Phosphatase: 78 U/L (ref 38–126)
Anion gap: 10 (ref 5–15)
BUN: 16 mg/dL (ref 8–23)
CO2: 27 mmol/L (ref 22–32)
Calcium: 9 mg/dL (ref 8.9–10.3)
Chloride: 99 mmol/L (ref 98–111)
Creatinine: 1.03 mg/dL — ABNORMAL HIGH (ref 0.44–1.00)
GFR, Estimated: 60 mL/min — ABNORMAL LOW (ref >60)
Glucose, Bld: 176 mg/dL — ABNORMAL HIGH (ref 70–99)
Potassium: 3 mmol/L — ABNORMAL LOW (ref 3.5–5.1)
Sodium: 136 mmol/L (ref 135–145)
Total Bilirubin: 1.7 mg/dL — ABNORMAL HIGH (ref 0.3–1.2)
Total Protein: 6.3 g/dL — ABNORMAL LOW (ref 6.5–8.1)

## 2022-01-06 LAB — CBC WITH DIFFERENTIAL (CANCER CENTER ONLY)
Abs Immature Granulocytes: 0.01 10*3/uL (ref 0.00–0.07)
Basophils Absolute: 0 10*3/uL (ref 0.0–0.1)
Basophils Relative: 1 %
Eosinophils Absolute: 0 10*3/uL (ref 0.0–0.5)
Eosinophils Relative: 0 %
HCT: 36.7 % (ref 36.0–46.0)
Hemoglobin: 13.2 g/dL (ref 12.0–15.0)
Immature Granulocytes: 0 %
Lymphocytes Relative: 12 %
Lymphs Abs: 0.3 10*3/uL — ABNORMAL LOW (ref 0.7–4.0)
MCH: 31.7 pg (ref 26.0–34.0)
MCHC: 36 g/dL (ref 30.0–36.0)
MCV: 88.2 fL (ref 80.0–100.0)
Monocytes Absolute: 0.1 10*3/uL (ref 0.1–1.0)
Monocytes Relative: 3 %
Neutro Abs: 2.1 10*3/uL (ref 1.7–7.7)
Neutrophils Relative %: 84 %
Platelet Count: 146 10*3/uL — ABNORMAL LOW (ref 150–400)
RBC: 4.16 MIL/uL (ref 3.87–5.11)
RDW: 17.2 % — ABNORMAL HIGH (ref 11.5–15.5)
WBC Count: 2.6 10*3/uL — ABNORMAL LOW (ref 4.0–10.5)
nRBC: 0 % (ref 0.0–0.2)

## 2022-01-06 LAB — LACTATE DEHYDROGENASE: LDH: 175 U/L (ref 98–192)

## 2022-01-06 MED ORDER — POTASSIUM CHLORIDE CRYS ER 20 MEQ PO TBCR: 20.0000 meq | EXTENDED_RELEASE_TABLET | Freq: Every day | ORAL | 1 refills | Status: DC

## 2022-01-06 MED ORDER — SODIUM CHLORIDE 0.9 % IV SOLN: Freq: Once | INTRAVENOUS | Status: AC

## 2022-01-06 MED ORDER — SODIUM CHLORIDE 0.9 % IV SOLN: 40.0000 mg | Freq: Once | INTRAVENOUS | Status: DC

## 2022-01-06 MED ORDER — DEXTROSE 5 % IV SOLN
20.0000 mg/m2 | Freq: Once | INTRAVENOUS | Status: AC
  Administered 2022-01-06: 40 mg via INTRAVENOUS
  Filled 2022-01-06: qty 15

## 2022-01-06 NOTE — Progress Notes (Signed)
Okay to treat with Bili of 1.7 per Dr. Lorenso Courier. Also patient took Decadron 40mg  po at home this morning. Due to the patient taking the Decadron at home she will not receive the Decadron 40mg  IV here prior to treatment per Dr. Lorenso Courier.

## 2022-01-06 NOTE — Patient Instructions (Signed)
[facility]  Discharge Instructions: Thank you for choosing [facility] to provide your oncology and hematology care.   If you have a lab appointment with the [facility], please go directly to the [facility] and check in at the registration area.   Wear comfortable clothing and clothing appropriate for easy access to any Portacath or PICC line.   We strive to give you quality time with your provider. You may need to reschedule your appointment if you arrive late (15 or more minutes).  Arriving late affects you and other patients whose appointments are after yours.  Also, if you miss three or more appointments without notifying the office, you may be dismissed from the clinic at the providers discretion.      For prescription refill requests, have your pharmacy contact our office and allow 72 hours for refills to be completed.    Today you received the following chemotherapy and/or immunotherapy agents : Kyprolis      To help prevent nausea and vomiting after your treatment, we encourage you to take your nausea medication as directed.  BELOW ARE SYMPTOMS THAT SHOULD BE REPORTED IMMEDIATELY: *FEVER GREATER THAN 100.4 F (38 C) OR HIGHER *CHILLS OR SWEATING *NAUSEA AND VOMITING THAT IS NOT CONTROLLED WITH YOUR NAUSEA MEDICATION *UNUSUAL SHORTNESS OF BREATH *UNUSUAL BRUISING OR BLEEDING *URINARY PROBLEMS (pain or burning when urinating, or frequent urination) *BOWEL PROBLEMS (unusual diarrhea, constipation, pain near the anus) TENDERNESS IN MOUTH AND THROAT WITH OR WITHOUT PRESENCE OF ULCERS (sore throat, sores in mouth, or a toothache) UNUSUAL RASH, SWELLING OR PAIN  UNUSUAL VAGINAL DISCHARGE OR ITCHING   Items with * indicate a potential emergency and should be followed up as soon as possible or go to the Emergency Department if any problems should occur.  Please show the CHEMOTHERAPY ALERT CARD or IMMUNOTHERAPY ALERT CARD at check-in to  the Emergency Department and triage nurse.  Should you have questions after your visit or need to cancel or reschedule your appointment, please contact [facility]  Dept: [phone]  and follow the prompts.  Office hours are 8:00 a.m. to 4:30 p.m. Monday - Friday. Please note that voicemails left after 4:00 p.m. may not be returned until the following business day.  We are closed weekends and major holidays. You have access to a nurse at all times for urgent questions. Please call the main number to the clinic Dept: [phone] and follow the prompts.   For any non-urgent questions, you may also contact your provider using MyChart. We now offer e-Visits for anyone 93 and older to request care online for non-urgent symptoms. For details visit [email or web address].   Also download the MyChart app! Go to the app store, search "MyChart", open the app, select [facility], and log in with your MyChart username and password.  Due to Covid, a mask is required upon entering the hospital/clinic. If you do not have a mask, one will be given to you upon arrival. For doctor visits, patients may have 1 support person aged 74 or older with them. For treatment visits, patients cannot have anyone with them due to current Covid guidelines and our immunocompromised population.   Carfilzomib injection What is this medication? CARFILZOMIB (kar FILZ oh mib) targets a specific protein within cancer cells and stops the cancer cells from growing. It is used to treat multiple myeloma. This medicine may be used for other purposes; ask your health care provider or pharmacist if  you have questions. COMMON BRAND NAME(S): KYPROLIS What should I tell my care team before I take this medication? They need to know if you have any of these conditions: heart disease history of blood clots irregular heartbeat kidney disease liver disease lung or breathing disease an unusual or allergic reaction  to carfilzomib, or other medicines, foods, dyes, or preservatives pregnant or trying to get pregnant breast-feeding How should I use this medication? This medicine is for injection or infusion into a vein. It is given by a health care professional in a hospital or clinic setting. Talk to your pediatrician regarding the use of this medicine in children. Special care may be needed. Overdosage: If you think you have taken too much of this medicine contact a poison control center or emergency room at once. NOTE: This medicine is only for you. Do not share this medicine with others. What if I miss a dose? It is important not to miss your dose. Call your doctor or health care professional if you are unable to keep an appointment. What may interact with this medication? Interactions are not expected. This list may not describe all possible interactions. Give your health care provider a list of all the medicines, herbs, non-prescription drugs, or dietary supplements you use. Also tell them if you smoke, drink alcohol, or use illegal drugs. Some items may interact with your medicine. What should I watch for while using this medication? Your condition will be monitored while you are receiving this medicine. You may need blood work done while you are taking this medicine. Do not become pregnant while taking this medicine or for 6 months after stopping it. Women should inform their health care provider if they wish to become pregnant or think they might be pregnant. Men should not father a child while taking this medicine and for 3 months after stopping it. There is a potential for serious side effects to an unborn child. Talk to your health care provider for more information. Do not breast-feed an infant while taking this medicine or for 2 weeks after stopping it. Check with your health care provider if you have severe diarrhea, nausea, and vomiting, or if you sweat a lot. The loss of too much body fluid may  make it dangerous for you to take this medicine. You may get drowsy or dizzy. Do not drive, use machinery, or do anything that needs mental alertness until you know how this medicine affects you. Do not stand up or sit up quickly, especially if you are an older patient. This reduces the risk of dizzy or fainting spells. What side effects may I notice from receiving this medication? Side effects that you should report to your doctor or health care professional as soon as possible: allergic reactions like skin rash, itching or hives, swelling of the face, lips, or tongue confusion dizziness feeling faint or lightheaded fever or chills palpitations seizures signs and symptoms of bleeding such as bloody or black, tarry stools; red or dark-brown urine; spitting up blood or brown material that looks like coffee grounds; red spots on the skin; unusual bruising or bleeding including from the eye, gums, or nose signs and symptoms of a blood clot such as breathing problems; changes in vision; chest pain; severe, sudden headache; pain, swelling, warmth in the leg; trouble speaking; sudden numbness or weakness of the face, arm or leg signs and symptoms of kidney injury like trouble passing urine or change in the amount of urine signs and symptoms of liver injury  like dark yellow or brown urine; general ill feeling or flu-like symptoms; light-colored stools; loss of appetite; nausea; right upper belly pain; unusually weak or tired; yellowing of the eyes or skin Side effects that usually do not require medical attention (report to your doctor or health care professional if they continue or are bothersome): back pain cough diarrhea headache muscle cramps trouble sleeping vomiting This list may not describe all possible side effects. Call your doctor for medical advice about side effects. You may report side effects to FDA at 1-800-FDA-1088. Where should I keep my medication? This drug is given in a  hospital or clinic and will not be stored at home. NOTE: This sheet is a summary. It may not cover all possible information. If you have questions about this medicine, talk to your doctor, pharmacist, or health care provider.  2022 Elsevier/Gold Standard (2020-12-16 00:00:00)

## 2022-01-06 NOTE — Progress Notes (Signed)
Dr. Lorenso Courier here to speak with patient.  Reviewed labs and will call in PO potassium for patient to take at home.

## 2022-01-10 ENCOUNTER — Other Ambulatory Visit: Payer: Self-pay | Admitting: Hematology and Oncology

## 2022-01-10 ENCOUNTER — Encounter: Payer: Self-pay | Admitting: Hematology and Oncology

## 2022-01-10 ENCOUNTER — Telehealth: Payer: Self-pay | Admitting: *Deleted

## 2022-01-10 NOTE — Progress Notes (Signed)
Carol [name]   [phone]  PROGRESS NOTE  Patient Care Team: [name] as PCP - General (Physician Assistant)  Hematological/Oncological History # Plasmacytoma with Multiple Myeloma  07/29/2021: CT cervical spine shows expansile lytic lesion which almost completely replaces the normal C2 vertebral body  08/24/2021: PET CT scan shows increased metabolic activity at C2 and C7  08/25/2021: biopsy of C2 lesion showed finding consistent with plasma cell neoplasm.  09/08/2021: start radiation therapy 09/12/2021: establish care with Dr. Lorenso Courier  09/28/2021: end radiation therapy 10/10/2021: Bone marrow biopsy. Results confirm plasma cell neoplasm consistent with multiple myeloma, kappa restricted.  10/26/2021: Cycle 1 Day 1 of VRD 11/18/2021: Cycle 2 Day 1 of VRD 11/25/2021: Velcade HELD due to full body rash. Resolved with steroid therapy.  12/16/2021: Cycle 1 Day 1 of Dara/Rev/Dex 12/30/2021: HELD Daratumumab due to toxicity. Patient unable to tolerate. Requested holding the medication  01/06/2022: Cycle 1 Day 1 of Kd   Interval History:  Carol [name] 66 y.o. female with medical history significant for multiple myeloma with plasmacytoma who presents for a follow up visit. The patient's last visit was on 01/03/2022. In the interim she discontinued Dara/Rev/Dex.   On exam today Carol [name] reports symptoms have been improving since discontinuation of the daratumumab.  She does not yet feel 100% and thinks that the Revlimid pills may also have been causing some of her symptoms.  Overall she is willing and able to proceed with Kyprolis treatment at this time.  She denies fevers, chills, night sweats, shortness of breath, chest pain or cough. She has no other complaints. Rest of the 10 point ROS is below.  MEDICAL HISTORY:  Past Medical History:  Diagnosis Date   Allergy    Ankle edema    both ankles   Arthritis    hands,knees,elbows   Cancer  ([place]) 2016   tumor kidney   Diabetes mellitus    type 2    Eczema    History of kidney stones    Hypothyroidism    Obesity    Sleep apnea    cpap machine setting of 3   Thyroid disease     SURGICAL HISTORY: Past Surgical History:  Procedure Laterality Date   BREAST BIOPSY Right 2001   CHOLECYSTECTOMY  2009   open   COLONOSCOPY     CYSTOSCOPY W/ RETROGRADES Left 11/22/2018   Procedure: CYSTOSCOPY WITH RETROGRADE PYELOGRAM;  Surgeon: Ardis Hughs, MD;  Location: [facility];  Service: Urology;  Laterality: Left;   CYSTOSCOPY WITH RETROGRADE PYELOGRAM, URETEROSCOPY AND STENT PLACEMENT Right 10/03/2021   Procedure: CYSTOSCOPY WITH RETROGRADE PYELOGRAM, URETEROSCOPY,STONE EXTRACTION AND STENT PLACEMENT;  Surgeon: Ceasar Mons, MD;  Location: [facility];  Service: Urology;  Laterality: Right;   DILATATION & CURRETTAGE/HYSTEROSCOPY WITH RESECTOCOPE N/A 01/21/2013   Procedure: DILATATION & CURETTAGE/HYSTEROSCOPY WITH RESECTOCOPE AND RESECTION OF ENDOMETRIAL;  Surgeon: Selinda Orion, MD;  Location: [facility];  Service: Gynecology;  Laterality: N/A;   KNEE ARTHROSCOPY     left    POLYPECTOMY     POSTERIOR CERVICAL FUSION/FORAMINOTOMY N/A 08/25/2021   Procedure: Occiput to Cervical 5 Posterior cervical instrumented fusion with open biopsy of Cervical 2 Mass. Cervical Two Ganglionectomy;  Surgeon: Carol Part, MD;  Location: [place];  Service: Neurosurgery;  Laterality: N/A;   ROBOT ASSISTED PYELOPLASTY Left 11/22/2018   Procedure: XI ROBOTIC ASSISTED ATTEMPTED PYELOPLASTY CONVERTED TO NEPHRECTOMY/LYSIS OF ADHESIONS/UMBILICAL HERNIA REPAIR;  Surgeon: Ardis Hughs,  MD;  Location: [facility];  Service: Urology;  Laterality: Left;   ROBOTIC ASSITED PARTIAL NEPHRECTOMY Left 06/11/2015   Procedure: LEFT ROBOTIC ASSISTED LAPAROSCOPIC  PARTIAL NEPHRECTOMY;  Surgeon: Ardis Hughs, MD;  Location: [facility];  Service: Urology;  Laterality: Left;   ROTATOR CUFF REPAIR  Left 2011   URETERAL BIOPSY Left 02/14/2017   Procedure: URETERAL BIOPSY;  Surgeon: Ardis Hughs, MD;  Location: [facility];  Service: Urology;  Laterality: Left;   URETEROSCOPY WITH HOLMIUM LASER LITHOTRIPSY Left 02/14/2017   Procedure: URETEROSCOPY LITHOTRIPSY, URETERAL BALLOON DILATION;  Surgeon: Ardis Hughs, MD;  Location: [facility];  Service: Urology;  Laterality: Left;  With STENT    SOCIAL HISTORY: Social History   Socioeconomic History   Marital status: Married    Spouse name: Not on file   Number of children: Not on file   Years of education: Not on file   Highest education level: Not on file  Occupational History   Not on file  Tobacco Use   Smoking status: Never   Smokeless tobacco: Never  Vaping Use   Vaping Use: Never used  Substance and Sexual Activity   Alcohol use: Yes    Comment: occ   Drug use: No   Sexual activity: Not on file  Other Topics Concern   Not on file  Social History Narrative   Not on file   Social Determinants of Health   Financial Resource Strain: Not on file  Food Insecurity: Not on file  Transportation Needs: Not on file  Physical Activity: Not on file  Stress: Not on file  Social Connections: Not on file  Intimate Partner Violence: Not on file    FAMILY HISTORY: Family History  Problem Relation Age of Onset   Cancer Father     ALLERGIES:  is allergic to morphine.  MEDICATIONS:  Current Outpatient Medications  Medication Sig Dispense Refill   potassium chloride SA (KLOR-CON M) 20 MEQ tablet Take 1 tablet (20 mEq total) by mouth daily. 30 tablet 1   acetaminophen (TYLENOL) 500 MG tablet Take 1,000 mg by mouth every 6 (six) hours as needed for mild pain.     acyclovir (ZOVIRAX) 400 MG tablet TAKE 1 TABLET BY MOUTH TWICE A DAY 180 tablet 1   allopurinol (ZYLOPRIM) 300 MG tablet Take 1 tablet (300 mg total) by mouth daily. 90 tablet 1   Ascorbic Acid (VITAMIN C ADULT GUMMIES PO) Take 1 tablet by mouth daily.     aspirin EC  81 MG tablet Take 1 tablet (81 mg total) by mouth daily. Swallow whole. 90 tablet 3   atorvastatin (LIPITOR) 20 MG tablet Take 20 mg by mouth at bedtime.     Cholecalciferol (VITAMIN D3) 25 MCG (1000 UT) CAPS Take 1,000 Units by mouth daily.     furosemide (LASIX) 20 MG tablet Take 20 mg by mouth daily as needed for fluid or edema.     HEMADY 20 MG TABS TAKE 40 MG BY MOUTH ONCE A WEEK. 2 TABLETS ON DAY OF CHEMO TREATMENT. 60 tablet 3   levothyroxine (SYNTHROID) 50 MCG tablet Take 50 mcg by mouth daily before breakfast.     Magnesium Hydroxide (MILK OF MAGNESIA PO) Take by mouth. 2 times a week     metFORMIN (GLUCOPHAGE) 500 MG tablet Take 500 mg by mouth daily with breakfast.     nystatin (MYCOSTATIN) 100000 UNIT/ML suspension Take 5 mLs (500,000 Units total) by mouth 4 (four) times daily. 60 mL 0  ondansetron (ZOFRAN ODT) 4 MG disintegrating tablet Take 1 tablet (4 mg total) by mouth every 8 (eight) hours as needed for nausea or vomiting. 30 tablet 0   ondansetron (ZOFRAN) 8 MG tablet Take 1 tablet (8 mg total) by mouth every 8 (eight) hours as needed. 30 tablet 0   OZEMPIC, 0.25 OR 0.5 MG/DOSE, 2 MG/1.5ML SOPN Inject 0.5 mg into the skin every Saturday.     PROAIR HFA 108 (90 BASE) MCG/ACT inhaler Inhale 2 puffs into the lungs every 4 (four) hours as needed for wheezing or shortness of breath. Uses seasonal  3   prochlorperazine (COMPAZINE) 10 MG tablet Take 1 tablet (10 mg total) by mouth every 6 (six) hours as needed for nausea or vomiting. 30 tablet 0   REVLIMID 25 MG capsule TAKE 1 CAPSULE BY MOUTH ONCE DAILY FOR 14 DAYS ON AND 7 DAYS OFF [employer] Auth #: [id number]  Date obtained: 12/14/21 14 capsule 0   Sennosides (SENOKOT PO) Take by mouth. Bid     No current facility-administered medications for this visit.    REVIEW OF SYSTEMS:   Constitutional: ( - ) fevers, ( - )  chills , ( - ) night sweats Eyes: ( - ) blurriness of vision, ( - ) double vision, ( - ) watery eyes Ears, nose, mouth,  throat, and face: ( - ) mucositis, ( - ) sore throat Respiratory: ( - ) cough, ( - ) dyspnea, ( - ) wheezes Cardiovascular: ( - ) palpitation, ( - ) chest discomfort, ( - ) lower extremity swelling Gastrointestinal:  ( - ) nausea, ( - ) heartburn, ( -) change in bowel habits Skin: ( - ) abnormal skin rashes Lymphatics: ( - ) new lymphadenopathy, ( - ) easy bruising Neurological: ( - ) numbness, ( - ) tingling, ( - ) new weaknesses Behavioral/Psych: ( - ) mood change, ( - ) new changes  All other systems were reviewed with the patient and are negative.  PHYSICAL EXAMINATION: ECOG PERFORMANCE STATUS: 1 - Symptomatic but completely ambulatory  There were no vitals filed for this visit.   There were no vitals filed for this visit.   GENERAL: Well-appearing middle-age Caucasian female alert, no distress and comfortable SKIN: No evidence of rash, erythema, dry skin, or raised lesions. EYES: conjunctiva are pink and non-injected, sclera clear LUNGS: clear to auscultation and percussion with normal breathing effort HEART: regular rate & rhythm and no murmurs and no lower extremity edema Musculoskeletal: no cyanosis of digits and no clubbing  PSYCH: alert & oriented x 3, fluent speech NEURO: no focal motor/sensory deficits  LABORATORY DATA:  I have reviewed the data as listed CBC Latest Ref Rng & Units 01/06/2022 12/30/2021 12/23/2021  WBC 4.0 - 10.5 K/uL 2.6(L) 1.2(L) 1.8(L)  Hemoglobin 12.0 - 15.0 g/dL 13.2 13.0 12.1  Hematocrit 36.0 - 46.0 % 36.7 37.0 35.9(L)  Platelets 150 - 400 K/uL 146(L) 121(L) 111(L)    CMP Latest Ref Rng & Units 01/06/2022 12/30/2021 12/23/2021  Glucose 70 - 99 mg/dL 176(H) 175(H) 148(H)  BUN 8 - 23 mg/dL '16 16 11  ' Creatinine 0.44 - 1.00 mg/dL 1.03(H) 0.86 0.95  Sodium 135 - 145 mmol/L 136 138 140  Potassium 3.5 - 5.1 mmol/L 3.0(L) 3.5 3.4(L)  Chloride 98 - 111 mmol/L 99 104 104  CO2 22 - 32 mmol/L '27 25 27  ' Calcium 8.9 - 10.3 mg/dL 9.0 9.4 9.2  Total Protein  6.5 - 8.1 g/dL 6.3(L) 6.4(L) 6.3(L)  Total  Bilirubin 0.3 - 1.2 mg/dL 1.7(H) 1.2 1.4(H)  Alkaline Phos 38 - 126 U/L 78 85 83  AST 15 - 41 U/L '15 15 18  ' ALT 0 - 44 U/L '14 19 26    ' Lab Results  Component Value Date   MPROTEIN Not Observed 12/16/2021   MPROTEIN Not Observed 12/09/2021   MPROTEIN Not Observed 11/11/2021   Lab Results  Component Value Date   KPAFRELGTCHN 36.4 (H) 12/16/2021   KPAFRELGTCHN 32.6 (H) 12/09/2021   KPAFRELGTCHN 120.7 (H) 11/11/2021   LAMBDASER 11.7 12/16/2021   LAMBDASER 9.3 12/09/2021   LAMBDASER 17.6 11/11/2021   KAPLAMBRATIO 3.11 (H) 12/16/2021   KAPLAMBRATIO 3.51 (H) 12/09/2021   KAPLAMBRATIO 6.86 (H) 11/11/2021    RADIOGRAPHIC STUDIES: No results found.  ASSESSMENT & PLAN JAVIER MAMONE 66 y.o. female with medical history significant for multiple myeloma with plasmacytoma who presents for a follow up visit.   # Free Kappa Multiple Myeloma # Plasmacytoma  -- original finding of L2 plasmacytoma on biopsy of back lesion, bone marrow biopsy confirms greater than 60% plasma cells consistent with multiple myeloma. --Cycle 1 Day 1 of VRD treatment started on 10/26/2021.  --VRD therapy stopped on 11/25/2021 due to rash. Started Dara/Rev/Dex on 12/16/2021.  --will order monthly SPEP, UPEP, SFLC and beta 2 microglobulin --weekly CBC, CMP, and LDH Plan:  --Labs today were reviewed and adequate for treatment today.  --Cycle 1 Day 1 of Kd therapy to start today.  --RTC in 2 weeks with interval weekly treatment.   #Leukopenia/Thrombocytopenia --WBC 2.6, increasing. Platelet count 146K, stable.  --Likely secondary to Revlimid therapy.  This has been discontinued. --continue to monitor.   #Rash--resolved --Present for approximately one week --Received IV solumedrol and medrol dose pak.  --Suspect drug induced (velcade)  #Thrush: --Currently on Nystatin mouthwash with improvement.  --Continue to improve.   #Skin sensitivity/dizziness/fatigue: --Likely  secondary to treatment. --Recommend to take Tylenol and Benadryl  #Supportive Care -- chemotherapy education complete  -- port placement not required -- zofran 87m q8H PRN and compazine 118mPO q6H for nausea -- acyclovir 40014mO BID for VCZ prophylaxis -- allopurinol 300m17m daily for TLS prophylaxis -- plan to start zometa after dental clearance is obtained. -- no pain medication required at this time.   No orders of the defined types were placed in this encounter.  All questions were answered. The patient knows to call the clinic with any problems, questions or concerns.  I have spent a total of 30 minutes minutes of face-to-face and non-face-to-face time, preparing to see the patient, performing a medically appropriate examination, counseling and educating the patient, ordering medications/tests, documenting clinical information in the electronic health record, and care coordination.   JohnLedell Peoples Department of Hematology/Oncology [facility]: [phone]: [phone]: [email or web address]   01/10/2022 1:07 PM

## 2022-01-10 NOTE — Telephone Encounter (Signed)
-----   Message from Dionne Ano, RN sent at 01/06/2022  3:46 PM EST ----- Regarding: Dr. Lorenso Courier 1st time Kyprolis call back 2/20

## 2022-01-10 NOTE — Telephone Encounter (Signed)
Called pt to see how she did with her recent treatment.  She reports doing well & encouraged that she has felt the best that she has in a while.  She denies any problems other than H/A & takes tylenol with some relief.  She knows how to reach [place] with any concerns & knows next appt.

## 2022-01-13 ENCOUNTER — Inpatient Hospital Stay: Payer: BC Managed Care – PPO | Admitting: Dietician

## 2022-01-13 ENCOUNTER — Other Ambulatory Visit: Payer: Self-pay

## 2022-01-13 ENCOUNTER — Inpatient Hospital Stay: Payer: BC Managed Care – PPO

## 2022-01-13 ENCOUNTER — Ambulatory Visit: Payer: BC Managed Care – PPO | Admitting: Hematology and Oncology

## 2022-01-13 VITALS — BP 133/66 | HR 87 | Temp 98.1°F | Resp 17 | Wt 172.8 lb

## 2022-01-13 DIAGNOSIS — C9030 Solitary plasmacytoma not having achieved remission: Secondary | ICD-10-CM | POA: Diagnosis not present

## 2022-01-13 DIAGNOSIS — C9000 Multiple myeloma not having achieved remission: Secondary | ICD-10-CM

## 2022-01-13 LAB — CBC WITH DIFFERENTIAL (CANCER CENTER ONLY)
Abs Immature Granulocytes: 0.01 10*3/uL (ref 0.00–0.07)
Basophils Absolute: 0 10*3/uL (ref 0.0–0.1)
Basophils Relative: 1 %
Eosinophils Absolute: 0 10*3/uL (ref 0.0–0.5)
Eosinophils Relative: 1 %
HCT: 34.7 % — ABNORMAL LOW (ref 36.0–46.0)
Hemoglobin: 12.2 g/dL (ref 12.0–15.0)
Immature Granulocytes: 0 %
Lymphocytes Relative: 37 %
Lymphs Abs: 1.2 10*3/uL (ref 0.7–4.0)
MCH: 31.5 pg (ref 26.0–34.0)
MCHC: 35.2 g/dL (ref 30.0–36.0)
MCV: 89.7 fL (ref 80.0–100.0)
Monocytes Absolute: 0.8 10*3/uL (ref 0.1–1.0)
Monocytes Relative: 25 %
Neutro Abs: 1.1 10*3/uL — ABNORMAL LOW (ref 1.7–7.7)
Neutrophils Relative %: 36 %
Platelet Count: 141 10*3/uL — ABNORMAL LOW (ref 150–400)
RBC: 3.87 MIL/uL (ref 3.87–5.11)
RDW: 16.9 % — ABNORMAL HIGH (ref 11.5–15.5)
WBC Count: 3.1 10*3/uL — ABNORMAL LOW (ref 4.0–10.5)
nRBC: 0 % (ref 0.0–0.2)

## 2022-01-13 LAB — CMP (CANCER CENTER ONLY)
ALT: 17 U/L (ref 0–44)
AST: 17 U/L (ref 15–41)
Albumin: 4 g/dL (ref 3.5–5.0)
Alkaline Phosphatase: 74 U/L (ref 38–126)
Anion gap: 11 (ref 5–15)
BUN: 13 mg/dL (ref 8–23)
CO2: 28 mmol/L (ref 22–32)
Calcium: 9.1 mg/dL (ref 8.9–10.3)
Chloride: 101 mmol/L (ref 98–111)
Creatinine: 1.07 mg/dL — ABNORMAL HIGH (ref 0.44–1.00)
GFR, Estimated: 57 mL/min — ABNORMAL LOW (ref >60)
Glucose, Bld: 141 mg/dL — ABNORMAL HIGH (ref 70–99)
Potassium: 3.2 mmol/L — ABNORMAL LOW (ref 3.5–5.1)
Sodium: 140 mmol/L (ref 135–145)
Total Bilirubin: 1.3 mg/dL — ABNORMAL HIGH (ref 0.3–1.2)
Total Protein: 5.8 g/dL — ABNORMAL LOW (ref 6.5–8.1)

## 2022-01-13 LAB — LACTATE DEHYDROGENASE: LDH: 203 U/L — ABNORMAL HIGH (ref 98–192)

## 2022-01-13 MED ORDER — SODIUM CHLORIDE 0.9 % IV SOLN: Freq: Once | INTRAVENOUS | Status: AC

## 2022-01-13 MED ORDER — DEXTROSE 5 % IV SOLN
70.0000 mg/m2 | Freq: Once | INTRAVENOUS | Status: AC
  Administered 2022-01-13: 130 mg via INTRAVENOUS
  Filled 2022-01-13: qty 60

## 2022-01-13 NOTE — Progress Notes (Signed)
Nutrition Follow-up:  Patient with multiple myeloma with plasmacytoma. She is currently Kyprolis (started 2/17).   Met with patient during infusion. She reports feeling "great" and "actually is thinking about what she wants for dinner" Her appetite has improved and eating a variety of foods. Patient reports eating beefaroni for lunch. Patient continues to have metallic taste, but this is improving. Patient reports slight decrease in appetite lasting for ~2 days following treatment. Patient is drinking Producer, television/film/video Protein once/day. She likes the [employer].    Medications: decadron, klor-con  Labs: K 3.2, glucose 141, Cr 1.07  Anthropometrics: Weight 172 lb 12 oz today decreased  2/17 - 174 lb 12 oz  2/3 - 185 lb 4.8 oz  1/20 - 188 lb 5 oz 1/5 - 195 lb 9.6 oz   NUTRITION DIAGNOSIS: Inadequate oral intake improving    INTERVENTION:  Continue eating high calorie high protein foods Continue strategies for altered taste Suggested pt switch to Ensure Plus for added calories, recommend 2-3 day - provided sample of vanilla Ensure Plus High Protein for pt to try     MONITORING, EVALUATION, GOAL: weight trends, intake    NEXT VISIT: Friday March 17 during infusion

## 2022-01-13 NOTE — Progress Notes (Signed)
OK to treat with ANC of 1.1 per Dr. Lorenso Courier

## 2022-01-13 NOTE — Progress Notes (Signed)
Per pt she took 10 tablets of PO dex 4mg  at home before coming to her appt.

## 2022-01-13 NOTE — Patient Instructions (Signed)
[facility]  Discharge Instructions: Thank you for choosing [facility] to provide your oncology and hematology care.   If you have a lab appointment with the Cancer Center, please go directly to the Cancer Center and check in at the registration area.   Wear comfortable clothing and clothing appropriate for easy access to any Portacath or PICC line.   We strive to give you quality time with your provider. You may need to reschedule your appointment if you arrive late (15 or more minutes).  Arriving late affects you and other patients whose appointments are after yours.  Also, if you miss three or more appointments without notifying the office, you may be dismissed from the clinic at the provider's discretion.      For prescription refill requests, have your pharmacy contact our office and allow 72 hours for refills to be completed.    Today you received the following chemotherapy and/or immunotherapy agents: Carfilzomib.      To help prevent nausea and vomiting after your treatment, we encourage you to take your nausea medication as directed.  BELOW ARE SYMPTOMS THAT SHOULD BE REPORTED IMMEDIATELY: *FEVER GREATER THAN 100.4 F (38 C) OR HIGHER *CHILLS OR SWEATING *NAUSEA AND VOMITING THAT IS NOT CONTROLLED WITH YOUR NAUSEA MEDICATION *UNUSUAL SHORTNESS OF BREATH *UNUSUAL BRUISING OR BLEEDING *URINARY PROBLEMS (pain or burning when urinating, or frequent urination) *BOWEL PROBLEMS (unusual diarrhea, constipation, pain near the anus) TENDERNESS IN MOUTH AND THROAT WITH OR WITHOUT PRESENCE OF ULCERS (sore throat, sores in mouth, or a toothache) UNUSUAL RASH, SWELLING OR PAIN  UNUSUAL VAGINAL DISCHARGE OR ITCHING   Items with * indicate a potential emergency and should be followed up as soon as possible or go to the Emergency Department if any problems should occur.  Please show the CHEMOTHERAPY ALERT CARD or IMMUNOTHERAPY ALERT CARD at check-in  to the Emergency Department and triage nurse.  Should you have questions after your visit or need to cancel or reschedule your appointment, please contact [facility]  Dept: [phone]  and follow the prompts.  Office hours are 8:00 a.m. to 4:30 p.m. Monday - Friday. Please note that voicemails left after 4:00 p.m. may not be returned until the following business day.  We are closed weekends and major holidays. You have access to a nurse at all times for urgent questions. Please call the main number to the clinic Dept: [phone] and follow the prompts.   For any non-urgent questions, you may also contact your provider using MyChart. We now offer e-Visits for anyone 18 and older to request care online for non-urgent symptoms. For details visit [email or web address].   Also download the MyChart app! Go to the app store, search "MyChart", open the app, select [facility], and log in with your MyChart username and password.  Due to Covid, a mask is required upon entering the hospital/clinic. If you do not have a mask, one will be given to you upon arrival. For doctor visits, patients may have 1 support person aged 18 or older with them. For treatment visits, patients cannot have anyone with them due to current Covid guidelines and our immunocompromised population.   

## 2022-01-16 LAB — MULTIPLE MYELOMA PANEL, SERUM
Albumin SerPl Elph-Mcnc: 3.5 g/dL (ref 2.9–4.4)
Albumin/Glob SerPl: 1.7 (ref 0.7–1.7)
Alpha 1: 0.2 g/dL (ref 0.0–0.4)
Alpha2 Glob SerPl Elph-Mcnc: 0.6 g/dL (ref 0.4–1.0)
B-Globulin SerPl Elph-Mcnc: 0.9 g/dL (ref 0.7–1.3)
Gamma Glob SerPl Elph-Mcnc: 0.3 g/dL — ABNORMAL LOW (ref 0.4–1.8)
Globulin, Total: 2.1 g/dL — ABNORMAL LOW (ref 2.2–3.9)
IgA: 13 mg/dL — ABNORMAL LOW (ref 87–352)
IgG (Immunoglobin G), Serum: 253 mg/dL — ABNORMAL LOW (ref 586–1602)
IgM (Immunoglobulin M), Srm: 7 mg/dL — ABNORMAL LOW (ref 26–217)
Total Protein ELP: 5.6 g/dL — ABNORMAL LOW (ref 6.0–8.5)

## 2022-01-16 LAB — KAPPA/LAMBDA LIGHT CHAINS
Kappa free light chain: 11.4 mg/L (ref 3.3–19.4)
Kappa, lambda light chain ratio: 2.92 — ABNORMAL HIGH (ref 0.26–1.65)
Lambda free light chains: 3.9 mg/L — ABNORMAL LOW (ref 5.7–26.3)

## 2022-01-20 ENCOUNTER — Inpatient Hospital Stay: Payer: BC Managed Care – PPO | Attending: Radiation Oncology

## 2022-01-20 ENCOUNTER — Ambulatory Visit: Payer: BC Managed Care – PPO

## 2022-01-20 ENCOUNTER — Inpatient Hospital Stay (HOSPITAL_BASED_OUTPATIENT_CLINIC_OR_DEPARTMENT_OTHER): Payer: BC Managed Care – PPO | Admitting: Hematology and Oncology

## 2022-01-20 ENCOUNTER — Other Ambulatory Visit: Payer: Self-pay

## 2022-01-20 ENCOUNTER — Encounter: Payer: Self-pay | Admitting: *Deleted

## 2022-01-20 ENCOUNTER — Inpatient Hospital Stay: Payer: BC Managed Care – PPO

## 2022-01-20 ENCOUNTER — Other Ambulatory Visit: Payer: BC Managed Care – PPO

## 2022-01-20 VITALS — BP 128/72 | HR 83 | Temp 98.0°F | Resp 16 | Wt 178.0 lb

## 2022-01-20 DIAGNOSIS — Z9221 Personal history of antineoplastic chemotherapy: Secondary | ICD-10-CM | POA: Diagnosis not present

## 2022-01-20 DIAGNOSIS — C9000 Multiple myeloma not having achieved remission: Secondary | ICD-10-CM

## 2022-01-20 DIAGNOSIS — Z5112 Encounter for antineoplastic immunotherapy: Secondary | ICD-10-CM | POA: Diagnosis present

## 2022-01-20 DIAGNOSIS — C9030 Solitary plasmacytoma not having achieved remission: Secondary | ICD-10-CM | POA: Diagnosis not present

## 2022-01-20 DIAGNOSIS — Z79899 Other long term (current) drug therapy: Secondary | ICD-10-CM | POA: Diagnosis not present

## 2022-01-20 DIAGNOSIS — Z923 Personal history of irradiation: Secondary | ICD-10-CM | POA: Diagnosis not present

## 2022-01-20 LAB — CMP (CANCER CENTER ONLY)
ALT: 16 U/L (ref 0–44)
AST: 16 U/L (ref 15–41)
Albumin: 3.9 g/dL (ref 3.5–5.0)
Alkaline Phosphatase: 74 U/L (ref 38–126)
Anion gap: 9 (ref 5–15)
BUN: 13 mg/dL (ref 8–23)
CO2: 24 mmol/L (ref 22–32)
Calcium: 9 mg/dL (ref 8.9–10.3)
Chloride: 108 mmol/L (ref 98–111)
Creatinine: 0.92 mg/dL (ref 0.44–1.00)
GFR, Estimated: >60 mL/min (ref >60)
Glucose, Bld: 136 mg/dL — ABNORMAL HIGH (ref 70–99)
Potassium: 3.6 mmol/L (ref 3.5–5.1)
Sodium: 141 mmol/L (ref 135–145)
Total Bilirubin: 1.7 mg/dL — ABNORMAL HIGH (ref 0.3–1.2)
Total Protein: 5.8 g/dL — ABNORMAL LOW (ref 6.5–8.1)

## 2022-01-20 LAB — CBC WITH DIFFERENTIAL (CANCER CENTER ONLY)
Abs Immature Granulocytes: 0.01 10*3/uL (ref 0.00–0.07)
Basophils Absolute: 0 10*3/uL (ref 0.0–0.1)
Basophils Relative: 0 %
Eosinophils Absolute: 0 10*3/uL (ref 0.0–0.5)
Eosinophils Relative: 0 %
HCT: 34.9 % — ABNORMAL LOW (ref 36.0–46.0)
Hemoglobin: 12 g/dL (ref 12.0–15.0)
Immature Granulocytes: 0 %
Lymphocytes Relative: 14 %
Lymphs Abs: 0.3 10*3/uL — ABNORMAL LOW (ref 0.7–4.0)
MCH: 31.6 pg (ref 26.0–34.0)
MCHC: 34.4 g/dL (ref 30.0–36.0)
MCV: 91.8 fL (ref 80.0–100.0)
Monocytes Absolute: 0.1 10*3/uL (ref 0.1–1.0)
Monocytes Relative: 5 %
Neutro Abs: 1.9 10*3/uL (ref 1.7–7.7)
Neutrophils Relative %: 81 %
Platelet Count: 108 10*3/uL — ABNORMAL LOW (ref 150–400)
RBC: 3.8 MIL/uL — ABNORMAL LOW (ref 3.87–5.11)
RDW: 17.7 % — ABNORMAL HIGH (ref 11.5–15.5)
WBC Count: 2.4 10*3/uL — ABNORMAL LOW (ref 4.0–10.5)
nRBC: 0 % (ref 0.0–0.2)

## 2022-01-20 LAB — LACTATE DEHYDROGENASE: LDH: 249 U/L — ABNORMAL HIGH (ref 98–192)

## 2022-01-20 MED ORDER — SODIUM CHLORIDE 0.9 % IV SOLN
40.0000 mg | Freq: Once | INTRAVENOUS | Status: DC
  Filled 2022-01-20: qty 4

## 2022-01-20 MED ORDER — SODIUM CHLORIDE 0.9 % IV SOLN: Freq: Once | INTRAVENOUS | Status: AC

## 2022-01-20 MED ORDER — DEXTROSE 5 % IV SOLN
70.0000 mg/m2 | Freq: Once | INTRAVENOUS | Status: AC
  Administered 2022-01-20: 130 mg via INTRAVENOUS
  Filled 2022-01-20: qty 60

## 2022-01-20 NOTE — Progress Notes (Signed)
Per Dr Libby Maw note, labs reviewed and are adequate for treatment today. ?

## 2022-01-20 NOTE — Progress Notes (Signed)
[place] [phone]   [phone]  PROGRESS NOTE  Patient Care Team: [name] as PCP - General (Physician Assistant)  Hematological/Oncological History # Plasmacytoma with Multiple Myeloma  07/29/2021: CT cervical spine shows expansile lytic lesion which almost completely replaces the normal C2 vertebral body  08/24/2021: PET CT scan shows increased metabolic activity at C2 and C7  08/25/2021: biopsy of C2 lesion showed finding consistent with plasma cell neoplasm.  09/08/2021: start radiation therapy 09/12/2021: establish care with Dr. Lorenso Courier  09/28/2021: end radiation therapy 10/10/2021: Bone marrow biopsy. Results confirm plasma cell neoplasm consistent with multiple myeloma, kappa restricted.  10/26/2021: Cycle 1 Day 1 of VRD 11/18/2021: Cycle 2 Day 1 of VRD 11/25/2021: Velcade HELD due to full body rash. Resolved with steroid therapy.  12/16/2021: Cycle 1 Day 1 of Dara/Rev/Dex 12/30/2021: HELD Daratumumab due to toxicity. Patient unable to tolerate. Requested holding the medication  01/06/2022: Cycle 1 Day 1 of Kd   Interval History:  Carol [name] 66 y.o. female with medical history significant for multiple myeloma with plasmacytoma who presents for a follow up visit. The patient's last visit was on 01/06/2022. Today is Cycle 1 Day 15 of Kd.   On exam today Carol [name] reports she is doing considerably better having discontinued the daratumumab and Revlimid.  She notes that her appetite has increased markedly and her energy is improved.  Her weight has also steadily been increasing.  Overall she "feels much better".  She notes that she does not have any side effects as well as medication.  She reports that she continues to discuss options for her neck with surgery.  She notes that she will meeting with them again in April and be undergoing a CT scan at that time.  She notes that her stools are soft but she is not having any frank diarrhea.   She denies any nausea or vomiting.  She denies fevers, chills, night sweats, shortness of breath, chest pain or cough. She has no other complaints. Rest of the 10 point ROS is below.  MEDICAL HISTORY:  Past Medical History:  Diagnosis Date   Allergy    Ankle edema    both ankles   Arthritis    hands,knees,elbows   Cancer ([place]) 2016   tumor kidney   Diabetes mellitus    type 2    Eczema    History of kidney stones    Hypothyroidism    Obesity    Sleep apnea    cpap machine setting of 3   Thyroid disease     SURGICAL HISTORY: Past Surgical History:  Procedure Laterality Date   BREAST BIOPSY Right 2001   CHOLECYSTECTOMY  2009   open   COLONOSCOPY     CYSTOSCOPY W/ RETROGRADES Left 11/22/2018   Procedure: CYSTOSCOPY WITH RETROGRADE PYELOGRAM;  Surgeon: Ardis Hughs, MD;  Location: [facility];  Service: Urology;  Laterality: Left;   CYSTOSCOPY WITH RETROGRADE PYELOGRAM, URETEROSCOPY AND STENT PLACEMENT Right 10/03/2021   Procedure: CYSTOSCOPY WITH RETROGRADE PYELOGRAM, URETEROSCOPY,STONE EXTRACTION AND STENT PLACEMENT;  Surgeon: Ceasar Mons, MD;  Location: [facility];  Service: Urology;  Laterality: Right;   DILATATION & CURRETTAGE/HYSTEROSCOPY WITH RESECTOCOPE N/A 01/21/2013   Procedure: DILATATION & CURETTAGE/HYSTEROSCOPY WITH RESECTOCOPE AND RESECTION OF ENDOMETRIAL;  Surgeon: Selinda Orion, MD;  Location: [facility];  Service: Gynecology;  Laterality: N/A;   KNEE ARTHROSCOPY     left    POLYPECTOMY     POSTERIOR  CERVICAL FUSION/FORAMINOTOMY N/A 08/25/2021   Procedure: Occiput to Cervical 5 Posterior cervical instrumented fusion with open biopsy of Cervical 2 Mass. Cervical Two Ganglionectomy;  Surgeon: Judith Part, MD;  Location: [place] [facility];  Service: Neurosurgery;  Laterality: N/A;   ROBOT ASSISTED PYELOPLASTY Left 11/22/2018   Procedure: XI ROBOTIC ASSISTED ATTEMPTED PYELOPLASTY CONVERTED TO NEPHRECTOMY/LYSIS OF ADHESIONS/UMBILICAL HERNIA REPAIR;   Surgeon: Ardis Hughs, MD;  Location: [facility];  Service: Urology;  Laterality: Left;   ROBOTIC ASSITED PARTIAL NEPHRECTOMY Left 06/11/2015   Procedure: LEFT ROBOTIC ASSISTED LAPAROSCOPIC  PARTIAL NEPHRECTOMY;  Surgeon: Ardis Hughs, MD;  Location: [facility];  Service: Urology;  Laterality: Left;   ROTATOR CUFF REPAIR Left 2011   URETERAL BIOPSY Left 02/14/2017   Procedure: URETERAL BIOPSY;  Surgeon: Ardis Hughs, MD;  Location: [facility];  Service: Urology;  Laterality: Left;   URETEROSCOPY WITH HOLMIUM LASER LITHOTRIPSY Left 02/14/2017   Procedure: URETEROSCOPY LITHOTRIPSY, URETERAL BALLOON DILATION;  Surgeon: Ardis Hughs, MD;  Location: [facility];  Service: Urology;  Laterality: Left;  With STENT    SOCIAL HISTORY: Social History   Socioeconomic History   Marital status: Married    Spouse name: Not on file   Number of children: Not on file   Years of education: Not on file   Highest education level: Not on file  Occupational History   Not on file  Tobacco Use   Smoking status: Never   Smokeless tobacco: Never  Vaping Use   Vaping Use: Never used  Substance and Sexual Activity   Alcohol use: Yes    Comment: occ   Drug use: No   Sexual activity: Not on file  Other Topics Concern   Not on file  Social History Narrative   Not on file   Social Determinants of Health   Financial Resource Strain: Not on file  Food Insecurity: Not on file  Transportation Needs: Not on file  Physical Activity: Not on file  Stress: Not on file  Social Connections: Not on file  Intimate Partner Violence: Not on file    FAMILY HISTORY: Family History  Problem Relation Age of Onset   Cancer Father     ALLERGIES:  is allergic to morphine.  MEDICATIONS:  Current Outpatient Medications  Medication Sig Dispense Refill   acetaminophen (TYLENOL) 500 MG tablet Take 1,000 mg by mouth every 6 (six) hours as needed for mild pain.     acyclovir (ZOVIRAX) 400 MG tablet TAKE 1 TABLET  BY MOUTH TWICE A DAY 180 tablet 1   allopurinol (ZYLOPRIM) 300 MG tablet Take 1 tablet (300 mg total) by mouth daily. 90 tablet 1   Ascorbic Acid (VITAMIN C ADULT GUMMIES PO) Take 1 tablet by mouth daily.     aspirin EC 81 MG tablet Take 1 tablet (81 mg total) by mouth daily. Swallow whole. 90 tablet 3   atorvastatin (LIPITOR) 20 MG tablet Take 20 mg by mouth at bedtime.     atorvastatin (LIPITOR) 40 MG tablet Take 40 mg by mouth daily.     Cholecalciferol (VITAMIN D3) 25 MCG (1000 UT) CAPS Take 1,000 Units by mouth daily.     dexamethasone (DECADRON) 4 MG tablet Take by mouth.     furosemide (LASIX) 20 MG tablet Take 20 mg by mouth daily as needed for fluid or edema.     HEMADY 20 MG TABS TAKE 40 MG BY MOUTH ONCE A WEEK. 2 TABLETS ON DAY OF CHEMO TREATMENT. 60 tablet 3  levothyroxine (SYNTHROID) 50 MCG tablet Take 50 mcg by mouth daily before breakfast.     Magnesium Hydroxide (MILK OF MAGNESIA PO) Take by mouth. 2 times a week     metFORMIN (GLUCOPHAGE) 500 MG tablet Take 500 mg by mouth daily with breakfast.     nystatin (MYCOSTATIN) 100000 UNIT/ML suspension Take 5 mLs (500,000 Units total) by mouth 4 (four) times daily. 60 mL 0   ondansetron (ZOFRAN ODT) 4 MG disintegrating tablet Take 1 tablet (4 mg total) by mouth every 8 (eight) hours as needed for nausea or vomiting. 30 tablet 0   ondansetron (ZOFRAN) 8 MG tablet Take 1 tablet (8 mg total) by mouth every 8 (eight) hours as needed. 30 tablet 0   OZEMPIC, 0.25 OR 0.5 MG/DOSE, 2 MG/1.5ML SOPN Inject 0.5 mg into the skin every Saturday.     potassium chloride SA (KLOR-CON M) 20 MEQ tablet Take 1 tablet (20 mEq total) by mouth daily. 30 tablet 1   PROAIR HFA 108 (90 BASE) MCG/ACT inhaler Inhale 2 puffs into the lungs every 4 (four) hours as needed for wheezing or shortness of breath. Uses seasonal  3   prochlorperazine (COMPAZINE) 10 MG tablet Take 1 tablet (10 mg total) by mouth every 6 (six) hours as needed for nausea or vomiting. 30 tablet  0   REVLIMID 25 MG capsule TAKE 1 CAPSULE BY MOUTH ONCE DAILY FOR 14 DAYS ON AND 7 DAYS OFF [employer] Auth #: [id number]  Date obtained: 12/14/21 14 capsule 0   Sennosides (SENOKOT PO) Take by mouth. Bid     No current facility-administered medications for this visit.    REVIEW OF SYSTEMS:   Constitutional: ( - ) fevers, ( - )  chills , ( - ) night sweats Eyes: ( - ) blurriness of vision, ( - ) double vision, ( - ) watery eyes Ears, nose, mouth, throat, and face: ( - ) mucositis, ( - ) sore throat Respiratory: ( - ) cough, ( - ) dyspnea, ( - ) wheezes Cardiovascular: ( - ) palpitation, ( - ) chest discomfort, ( - ) lower extremity swelling Gastrointestinal:  ( - ) nausea, ( - ) heartburn, ( -) change in bowel habits Skin: ( - ) abnormal skin rashes Lymphatics: ( - ) new lymphadenopathy, ( - ) easy bruising Neurological: ( - ) numbness, ( - ) tingling, ( - ) new weaknesses Behavioral/Psych: ( - ) mood change, ( - ) new changes  All other systems were reviewed with the patient and are negative.  PHYSICAL EXAMINATION: ECOG PERFORMANCE STATUS: 1 - Symptomatic but completely ambulatory  Vitals:   01/20/22 1130  BP: 128/72  Pulse: 83  Resp: 16  Temp: 98 F (36.7 C)  SpO2: 100%     Filed Weights   01/20/22 1130  Weight: 178 lb (80.7 kg)    GENERAL: Well-appearing middle-age Caucasian female alert, no distress and comfortable SKIN: No evidence of rash, erythema, dry skin, or raised lesions. EYES: conjunctiva are pink and non-injected, sclera clear LUNGS: clear to auscultation and percussion with normal breathing effort HEART: regular rate & rhythm and no murmurs and no lower extremity edema Musculoskeletal: no cyanosis of digits and no clubbing  PSYCH: alert & oriented x 3, fluent speech NEURO: no focal motor/sensory deficits  LABORATORY DATA:  I have reviewed the data as listed CBC Latest Ref Rng & Units 01/20/2022 01/13/2022 01/06/2022  WBC 4.0 - 10.5 K/uL 2.4(L) 3.1(L) 2.6(L)   Hemoglobin 12.0 - 15.0 g/dL  12.0 12.2 13.2  Hematocrit 36.0 - 46.0 % 34.9(L) 34.7(L) 36.7  Platelets 150 - 400 K/uL 108(L) 141(L) 146(L)    CMP Latest Ref Rng & Units 01/20/2022 01/13/2022 01/06/2022  Glucose 70 - 99 mg/dL 136(H) 141(H) 176(H)  BUN 8 - 23 mg/dL '13 13 16  ' Creatinine 0.44 - 1.00 mg/dL 0.92 1.07(H) 1.03(H)  Sodium 135 - 145 mmol/L 141 140 136  Potassium 3.5 - 5.1 mmol/L 3.6 3.2(L) 3.0(L)  Chloride 98 - 111 mmol/L 108 101 99  CO2 22 - 32 mmol/L '24 28 27  ' Calcium 8.9 - 10.3 mg/dL 9.0 9.1 9.0  Total Protein 6.5 - 8.1 g/dL 5.8(L) 5.8(L) 6.3(L)  Total Bilirubin 0.3 - 1.2 mg/dL 1.7(H) 1.3(H) 1.7(H)  Alkaline Phos 38 - 126 U/L 74 74 78  AST 15 - 41 U/L '16 17 15  ' ALT 0 - 44 U/L '16 17 14    ' Lab Results  Component Value Date   MPROTEIN Not Observed 01/13/2022   MPROTEIN Not Observed 12/16/2021   MPROTEIN Not Observed 12/09/2021   Lab Results  Component Value Date   KPAFRELGTCHN 11.4 01/13/2022   KPAFRELGTCHN 36.4 (H) 12/16/2021   KPAFRELGTCHN 32.6 (H) 12/09/2021   LAMBDASER 3.9 (L) 01/13/2022   LAMBDASER 11.7 12/16/2021   LAMBDASER 9.3 12/09/2021   KAPLAMBRATIO 2.92 (H) 01/13/2022   KAPLAMBRATIO 3.11 (H) 12/16/2021   KAPLAMBRATIO 3.51 (H) 12/09/2021    RADIOGRAPHIC STUDIES: No results found.  ASSESSMENT & PLAN Carol [name] 66 y.o. female with medical history significant for multiple myeloma with plasmacytoma who presents for a follow up visit.   # Free Kappa Multiple Myeloma # Plasmacytoma  -- original finding of L2 plasmacytoma on biopsy of back lesion, bone marrow biopsy confirms greater than 60% plasma cells consistent with multiple myeloma. --Cycle 1 Day 1 of VRD treatment started on 10/26/2021.  --VRD therapy stopped on 11/25/2021 due to rash. Started Dara/Rev/Dex on 12/16/2021.  --will order monthly SPEP, UPEP, SFLC and beta 2 microglobulin --weekly CBC, CMP, and LDH --Cycle 1 Day 1 of Kd started on 01/06/2022 Plan:  --Labs today were reviewed and adequate  for treatment today.  --Cycle 1 Day 15 of Kd therapy today.  --RTC in 2 weeks with interval weekly treatment.   #Leukopenia/Thrombocytopenia --WBC 2.4, increasing. Platelet count 108K, stable.  --Likely secondary to Revlimid therapy.  This has been discontinued. --continue to monitor.   #Rash--resolved --Present for approximately one week --Received IV solumedrol and medrol dose pak.  --Suspect drug induced (velcade)  #Thrush: --Currently on Nystatin mouthwash with improvement.  --Continue to improve.   #Skin sensitivity/dizziness/fatigue: --Likely secondary to treatment. --Recommend to take Tylenol and Benadryl  #Supportive Care -- chemotherapy education complete  -- port placement not required -- zofran 31m q8H PRN and compazine 169mPO q6H for nausea -- acyclovir 40061mO BID for VCZ prophylaxis -- allopurinol 300m81m daily for TLS prophylaxis -- plan to start zometa at next clinic visit. Will continue q 3 months.  -- no pain medication required at this time.   No orders of the defined types were placed in this encounter.  All questions were answered. The patient knows to call the clinic with any problems, questions or concerns.  I have spent a total of 30 minutes minutes of face-to-face and non-face-to-face time, preparing to see the patient, performing a medically appropriate examination, counseling and educating the patient, ordering medications/tests, documenting clinical information in the electronic health record, and care coordination.   JohnLedell Peoples Department  of Hematology/Oncology [place] [facility] Phone: [phone] Pager: [phone] Email: [email or web address]   01/22/2022 11:29 AM

## 2022-01-20 NOTE — Patient Instructions (Signed)
[facility]ICAL [facility]COLOG[facility]  Discharge Instructions: Thank you for choosing [facility] to provide your oncology and hematology care.   If you have a lab appointment with the Cancer Center, please go directly to the Cancer Center and check in at the registration area.   Wear comfortable clothing and clothing appropriate for easy access to any Portacath or PICC line.   We strive to give you quality time with your provider. You may need to reschedule your appointment if you arrive late (15 or more minutes).  Arriving late affects you and other patients whose appointments are after yours.  Also, if you miss three or more appointments without notifying the office, you may be dismissed from the clinic at the provider's discretion.      For prescription refill requests, have your pharmacy contact our office and allow 72 hours for refills to be completed.    Today you received the following chemotherapy and/or immunotherapy agents carfilzomib   To help prevent nausea and vomiting after your treatment, we encourage you to take your nausea medication as directed.  BELOW ARE SYMPTOMS THAT SHOULD BE REPORTED IMMEDIATELY: . *FEVER GREATER THAN 100.4 F (38 C) OR HIGHER . *CHILLS OR SWEATING . *NAUSEA AND VOMITING THAT IS NOT CONTROLLED WITH YOUR NAUSEA MEDICATION . *UNUSUAL SHORTNESS OF BREATH . *UNUSUAL BRUISING OR BLEEDING . *URINARY PROBLEMS (pain or burning when urinating, or frequent urination) . *BOWEL PROBLEMS (unusual diarrhea, constipation, pain near the anus) . TENDERNESS IN MOUTH AND THROAT WITH OR WITHOUT PRESENCE OF ULCERS (sore throat, sores in mouth, or a toothache) . UNUSUAL RASH, SWELLING OR PAIN  . UNUSUAL VAGINAL DISCHARGE OR ITCHING   Items with * indicate a potential emergency and should be followed up as soon as possible or go to the Emergency Department if any problems should occur.  Please show the CHEMOTHERAPY ALERT CARD or IMMUNOTHERAPY ALERT  CARD at check-in to the Emergency Department and triage nurse.  Should you have questions after your visit or need to cancel or reschedule your appointment, please contact [facility]  Dept: [phone]  and follow the prompts.  Office hours are 8:00 a.m. to 4:30 p.m. Monday - Friday. Please note that voicemails left after 4:00 p.m. may not be returned until the following business day.  We are closed weekends and major holidays. You have access to a nurse at all times for urgent questions. Please call the main number to the clinic Dept: [phone] and follow the prompts.   For any non-urgent questions, you may also contact your provider using MyChart. We now offer e-Visits for anyone 18 and older to request care online for non-urgent symptoms. For details visit [email or web address].   Also download the MyChart app! Go to the app store, search "MyChart", open the app, select [place], and log in with your MyChart username and password.  Due to Covid, a mask is required upon entering the hospital/clinic. If you do not have a mask, one will be given to you upon arrival. For doctor visits, patients may have 1 support person aged 18 or older with them. For treatment visits, patients cannot have anyone with them due to current Covid guidelines and our immunocompromised population.   

## 2022-01-22 ENCOUNTER — Encounter: Payer: Self-pay | Admitting: Hematology and Oncology

## 2022-01-22 DIAGNOSIS — C9000 Multiple myeloma not having achieved remission: Secondary | ICD-10-CM | POA: Insufficient documentation

## 2022-01-23 ENCOUNTER — Other Ambulatory Visit: Payer: Self-pay | Admitting: *Deleted

## 2022-01-23 ENCOUNTER — Telehealth: Payer: Self-pay | Admitting: *Deleted

## 2022-01-23 DIAGNOSIS — C9000 Multiple myeloma not having achieved remission: Secondary | ICD-10-CM

## 2022-01-23 NOTE — Telephone Encounter (Signed)
TCT patient regarding her upcoming appts. Advised that she receives her Kyprolis weekly for 3 weeks and then has 1 week off. Advised that she has the week off this week. Advised the rest of her appts will reflect that she is 3 weeks on 1 week off. ?Pt pleased to hear this. ? ? ?Scheduling made aware. ?

## 2022-01-26 ENCOUNTER — Inpatient Hospital Stay: Payer: BC Managed Care – PPO

## 2022-01-26 ENCOUNTER — Ambulatory Visit: Payer: BC Managed Care – PPO | Admitting: Hematology and Oncology

## 2022-01-30 ENCOUNTER — Other Ambulatory Visit: Payer: Self-pay | Admitting: Hematology and Oncology

## 2022-01-31 ENCOUNTER — Other Ambulatory Visit: Payer: Self-pay | Admitting: Radiology

## 2022-02-01 ENCOUNTER — Other Ambulatory Visit: Payer: Self-pay

## 2022-02-01 ENCOUNTER — Ambulatory Visit (HOSPITAL_COMMUNITY)
Admission: RE | Admit: 2022-02-01 | Discharge: 2022-02-01 | Disposition: A | Discharge status: Home / Self Care | Payer: BC Managed Care – PPO | Source: Ambulatory Visit | Attending: Hematology and Oncology | Admitting: Hematology and Oncology

## 2022-02-01 ENCOUNTER — Other Ambulatory Visit: Payer: Self-pay | Admitting: Hematology and Oncology

## 2022-02-01 ENCOUNTER — Encounter (HOSPITAL_COMMUNITY): Payer: Self-pay

## 2022-02-01 DIAGNOSIS — Z7985 Long-term (current) use of injectable non-insulin antidiabetic drugs: Secondary | ICD-10-CM | POA: Insufficient documentation

## 2022-02-01 DIAGNOSIS — C9000 Multiple myeloma not having achieved remission: Secondary | ICD-10-CM | POA: Diagnosis present

## 2022-02-01 DIAGNOSIS — Z7982 Long term (current) use of aspirin: Secondary | ICD-10-CM | POA: Insufficient documentation

## 2022-02-01 DIAGNOSIS — E119 Type 2 diabetes mellitus without complications: Secondary | ICD-10-CM | POA: Diagnosis not present

## 2022-02-01 DIAGNOSIS — Z79899 Other long term (current) drug therapy: Secondary | ICD-10-CM | POA: Insufficient documentation

## 2022-02-01 DIAGNOSIS — Z79890 Hormone replacement therapy (postmenopausal): Secondary | ICD-10-CM | POA: Insufficient documentation

## 2022-02-01 DIAGNOSIS — E039 Hypothyroidism, unspecified: Secondary | ICD-10-CM | POA: Insufficient documentation

## 2022-02-01 DIAGNOSIS — G4733 Obstructive sleep apnea (adult) (pediatric): Secondary | ICD-10-CM | POA: Diagnosis not present

## 2022-02-01 HISTORY — PX: IR IMAGING GUIDED PORT INSERTION: IMG5740

## 2022-02-01 LAB — GLUCOSE, CAPILLARY: Glucose-Capillary: 86 mg/dL (ref 70–99)

## 2022-02-01 MED ORDER — MIDAZOLAM HCL 2 MG/2ML IJ SOLN
INTRAMUSCULAR | Status: AC
  Filled 2022-02-01: qty 4

## 2022-02-01 MED ORDER — FENTANYL CITRATE (PF) 100 MCG/2ML IJ SOLN
INTRAMUSCULAR | Status: AC | PRN
  Administered 2022-02-01: 50 ug via INTRAVENOUS

## 2022-02-01 MED ORDER — HEPARIN SOD (PORK) LOCK FLUSH 100 UNIT/ML IV SOLN
INTRAVENOUS | Status: AC | PRN
  Administered 2022-02-01: 500 [IU] via INTRAVENOUS

## 2022-02-01 MED ORDER — LIDOCAINE-EPINEPHRINE 1 %-1:100000 IJ SOLN
INTRAMUSCULAR | Status: AC | PRN
  Administered 2022-02-01: 10 mL via INTRADERMAL

## 2022-02-01 MED ORDER — LIDOCAINE-EPINEPHRINE 1 %-1:100000 IJ SOLN
INTRAMUSCULAR | Status: AC
  Filled 2022-02-01: qty 1

## 2022-02-01 MED ORDER — SODIUM CHLORIDE 0.9 % IV SOLN: INTRAVENOUS | Status: DC

## 2022-02-01 MED ORDER — MIDAZOLAM HCL 2 MG/2ML IJ SOLN
INTRAMUSCULAR | Status: AC | PRN
Start: 2022-02-01 — End: 2022-02-01
  Administered 2022-02-01: 1 mg via INTRAVENOUS

## 2022-02-01 MED ORDER — MIDAZOLAM HCL 2 MG/2ML IJ SOLN
INTRAMUSCULAR | Status: AC | PRN
  Administered 2022-02-01: 2 mg via INTRAVENOUS

## 2022-02-01 MED ORDER — FENTANYL CITRATE (PF) 100 MCG/2ML IJ SOLN
INTRAMUSCULAR | Status: AC
  Filled 2022-02-01: qty 4

## 2022-02-01 MED ORDER — HEPARIN SOD (PORK) LOCK FLUSH 100 UNIT/ML IV SOLN
INTRAVENOUS | Status: AC
  Administered 2022-02-01: 500 [IU]
  Filled 2022-02-01: qty 5

## 2022-02-01 MED ORDER — MIDAZOLAM HCL 2 MG/2ML IJ SOLN
INTRAMUSCULAR | Status: AC | PRN
  Administered 2022-02-01: 1 mg via INTRAVENOUS

## 2022-02-01 NOTE — H&P (Signed)
Chief Complaint: Patient was seen in consultation today for tunneled catheter with port   at the request of Dorsey,John T IV  Referring Physician(s): Dorsey,John T IV  Supervising Physician: Simonne Come  Patient Status: [facility] - Out-pt  History of Present Illness: NATANE DISANTIS is a 66 y.o. female w/ PMH of kidney cancer, DM II, eczema, OSA on CPAP, thyroid disease and multiple myeloma. Pt had bone marrow biopsy 10/10/21 that resulted multiple myeloma diagnosis. Pt was referred by Dr. Jeanie Sewer for tunneled catheter with port placement to continue chemotherapy.   Past Medical History:  Diagnosis Date   Allergy    Ankle edema    both ankles   Arthritis    hands,knees,elbows   Cancer (HCC) 2016   tumor kidney   Diabetes mellitus    type 2    Eczema    History of kidney stones    Hypothyroidism    Obesity    Sleep apnea    cpap machine setting of 3   Thyroid disease     Past Surgical History:  Procedure Laterality Date   BREAST BIOPSY Right 2001   CHOLECYSTECTOMY  2009   open   COLONOSCOPY     CYSTOSCOPY W/ RETROGRADES Left 11/22/2018   Procedure: CYSTOSCOPY WITH RETROGRADE PYELOGRAM;  Surgeon: Crist Fat, MD;  [facility]: [facility];  Service: Urology;  Laterality: Left;   CYSTOSCOPY WITH RETROGRADE PYELOGRAM, URETEROSCOPY AND STENT PLACEMENT Right 10/03/2021   Procedure: CYSTOSCOPY WITH RETROGRADE PYELOGRAM, URETEROSCOPY,STONE EXTRACTION AND STENT PLACEMENT;  Surgeon: Rene Paci, MD;  [facility];  Service: Urology;  Laterality: Right;   DILATATION & CURRETTAGE/HYSTEROSCOPY WITH RESECTOCOPE N/A 01/21/2013   Procedure: DILATATION & CURETTAGE/HYSTEROSCOPY WITH RESECTOCOPE AND RESECTION OF ENDOMETRIAL;  Surgeon: Gretta Cool, MD;  Location: [facility];  Service: Gynecology;  Laterality: N/A;   KNEE ARTHROSCOPY     left    POLYPECTOMY     POSTERIOR CERVICAL FUSION/FORAMINOTOMY N/A 08/25/2021   Procedure: Occiput to Cervical 5  Posterior cervical instrumented fusion with open biopsy of Cervical 2 Mass. Cervical Two Ganglionectomy;  Surgeon: Jadene Pierini, MD;  Location: [facility];  Service: Neurosurgery;  Laterality: N/A;   ROBOT ASSISTED PYELOPLASTY Left 11/22/2018   Procedure: XI ROBOTIC ASSISTED ATTEMPTED PYELOPLASTY CONVERTED TO NEPHRECTOMY/LYSIS OF ADHESIONS/UMBILICAL HERNIA REPAIR;  Surgeon: Crist Fat, MD;  [facility];  Service: Urology;  Laterality: Left;   ROBOTIC ASSITED PARTIAL NEPHRECTOMY Left 06/11/2015   Procedure: LEFT ROBOTIC ASSISTED LAPAROSCOPIC  PARTIAL NEPHRECTOMY;  Surgeon: Crist Fat, MD;  [facility];  Service: Urology;  Laterality: Left;   ROTATOR CUFF REPAIR Left 2011   URETERAL BIOPSY Left 02/14/2017   Procedure: URETERAL BIOPSY;  Surgeon: Crist Fat, MD;  [facility];  Service: Urology;  Laterality: Left;   URETEROSCOPY WITH HOLMIUM LASER LITHOTRIPSY Left 02/14/2017   Procedure: URETEROSCOPY LITHOTRIPSY, URETERAL BALLOON DILATION;  Surgeon: Crist Fat, MD;  [facility];  Service: Urology;  Laterality: Left;  With STENT    Allergies: Morphine  Medications: Prior to Admission medications   Medication Sig Start Date End Date Taking? Authorizing Provider  acetaminophen (TYLENOL) 500 MG tablet Take 1,000 mg by mouth every 6 (six) hours as needed for mild pain.    [provider]  acyclovir (ZOVIRAX) 400 MG tablet TAKE 1 TABLET BY MOUTH TWICE A DAY 01/10/22   Jaci Standard, MD  allopurinol (ZYLOPRIM) 300 MG tablet Take 1 tablet (300 mg  total) by mouth daily. 10/23/21   Jaci Standard, MD  Ascorbic Acid (VITAMIN C ADULT GUMMIES PO) Take 1 tablet by mouth daily.    [provider]  aspirin EC 81 MG tablet Take 1 tablet (81 mg total) by mouth daily. Swallow whole. 10/19/21   Jaci Standard, MD  atorvastatin (LIPITOR) 20 MG tablet Take 20 mg by mouth at bedtime. 08/06/15   [provider]  atorvastatin (LIPITOR) 40  MG tablet Take 40 mg by mouth daily. 12/15/21   [provider]  Cholecalciferol (VITAMIN D3) 25 MCG (1000 UT) CAPS Take 1,000 Units by mouth daily.    [provider]  dexamethasone (DECADRON) 4 MG tablet Take by mouth. 12/26/21   [provider]  furosemide (LASIX) 20 MG tablet Take 20 mg by mouth daily as needed for fluid or edema.    [provider]  HEMADY 20 MG TABS TAKE 40 MG BY MOUTH ONCE A WEEK. 2 TABLETS ON DAY OF CHEMO TREATMENT. 12/23/21   Briant Cedar, PA-C  KLOR-CON M20 20 MEQ tablet TAKE 1 TABLET BY MOUTH EVERY DAY 01/30/22   Briant Cedar, PA-C  levothyroxine (SYNTHROID) 50 MCG tablet Take 50 mcg by mouth daily before breakfast. 04/14/21   [provider]  Magnesium Hydroxide (MILK OF MAGNESIA PO) Take by mouth. 2 times a week    [provider]  metFORMIN (GLUCOPHAGE) 500 MG tablet Take 500 mg by mouth daily with breakfast.    [provider]  nystatin (MYCOSTATIN) 100000 UNIT/ML suspension Take 5 mLs (500,000 Units total) by mouth 4 (four) times daily. 11/24/21   Walisiewicz, Kaitlyn E, PA-C  ondansetron (ZOFRAN ODT) 4 MG disintegrating tablet Take 1 tablet (4 mg total) by mouth every 8 (eight) hours as needed for nausea or vomiting. 10/03/21   Rene Paci, MD  ondansetron (ZOFRAN) 8 MG tablet Take 1 tablet (8 mg total) by mouth every 8 (eight) hours as needed. 10/19/21   Jaci Standard, MD  OZEMPIC, 0.25 OR 0.5 MG/DOSE, 2 MG/1.5ML SOPN Inject 0.5 mg into the skin every Saturday. 07/19/21   [provider]  PROAIR HFA 108 (90 BASE) MCG/ACT inhaler Inhale 2 puffs into the lungs every 4 (four) hours as needed for wheezing or shortness of breath. Uses seasonal 02/12/15   [provider]  prochlorperazine (COMPAZINE) 10 MG tablet Take 1 tablet (10 mg total) by mouth every 6 (six) hours as needed for nausea or vomiting. 10/19/21   Jaci Standard, MD  REVLIMID 25 MG capsule TAKE 1 CAPSULE BY  MOUTH ONCE DAILY FOR 14 DAYS ON AND 7 DAYS OFF [employer] Auth #: [id number]  Date obtained: 12/14/21 12/14/21   Jaci Standard, MD  Sennosides (SENOKOT PO) Take by mouth. Bid    [provider]     Family History  Problem Relation Age of Onset   Cancer Father     Social History   Socioeconomic History   Marital status: Married    Spouse name: Not on file   Number of children: Not on file   Years of education: Not on file   Highest education level: Not on file  Occupational History   Not on file  Tobacco Use   Smoking status: Never   Smokeless tobacco: Never  Vaping Use   Vaping Use: Never used  Substance and Sexual Activity   Alcohol use: Yes    Comment: occ   Drug use:  No   Sexual activity: Not on file  Other Topics Concern   Not on file  Social History Narrative   Not on file   Social Determinants of Health   Financial Resource Strain: Not on file  Food Insecurity: Not on file  Transportation Needs: Not on file  Physical Activity: Not on file  Stress: Not on file  Social Connections: Not on file    Review of Systems: A 12 point ROS discussed and pertinent positives are indicated in the HPI above.  All other systems are negative.  Review of Systems  Constitutional:  Negative for fever.  Respiratory:  Negative for cough.   Cardiovascular:  Negative for chest pain.  Gastrointestinal:  Negative for abdominal pain, nausea and vomiting.  Musculoskeletal:  Positive for neck pain.   Vital Signs: BP 134/70   Pulse 91   Temp 98.3 F (36.8 C) (Oral)   Resp 16   SpO2 100%   Physical Exam Constitutional:      Appearance: Normal appearance.  HENT:     Head: Normocephalic and atraumatic.     Mouth/Throat:     Mouth: Mucous membranes are moist.     Pharynx: Oropharynx is clear.  Eyes:     Extraocular Movements: Extraocular movements intact.     Pupils: Pupils are equal, round, and reactive to light.  Cardiovascular:     Rate and Rhythm: Normal rate and  regular rhythm.     Pulses: Normal pulses.     Heart sounds: Normal heart sounds.  Pulmonary:     Effort: No respiratory distress.     Breath sounds: Normal breath sounds.  Abdominal:     General: Bowel sounds are normal. There is no distension.     Palpations: Abdomen is soft.     Tenderness: There is no abdominal tenderness. There is no guarding.  Musculoskeletal:     Right lower leg: No edema.     Left lower leg: No edema.  Skin:    General: Skin is warm and dry.  Neurological:     Mental Status: She is alert and oriented to person, place, and time.  Psychiatric:        Mood and Affect: Mood normal.        Behavior: Behavior normal.        Thought Content: Thought content normal.        Judgment: Judgment normal.    Imaging: No results found.  Labs:  CBC: Recent Labs    12/30/21 1435 01/06/22 1308 01/13/22 1351 01/20/22 1047  WBC 1.2* 2.6* 3.1* 2.4*  HGB 13.0 13.2 12.2 12.0  HCT 37.0 36.7 34.7* 34.9*  PLT 121* 146* 141* 108*    COAGS: Recent Labs    10/10/21 0718  INR 1.0    BMP: Recent Labs    12/30/21 1435 01/06/22 1308 01/13/22 1351 01/20/22 1047  NA 138 136 140 141  K 3.5 3.0* 3.2* 3.6  CL 104 99 101 108  CO2 25 27 28 24   GLUCOSE 175* 176* 141* 136*  BUN 16 16 13 13   CALCIUM 9.4 9.0 9.1 9.0  CREATININE 0.86 1.03* 1.07* 0.92  GFRNONAA >60 60* 57* >60    LIVER FUNCTION TESTS: Recent Labs    12/30/21 1435 01/06/22 1308 01/13/22 1351 01/20/22 1047  BILITOT 1.2 1.7* 1.3* 1.7*  AST 15 15 17 16   ALT 19 14 17 16   ALKPHOS 85 78 74 74  PROT 6.4* 6.3* 5.8* 5.8*  ALBUMIN 4.4 4.4 4.0  3.9    TUMOR MARKERS: No results for input(s): AFPTM, CEA, CA199, CHROMGRNA in the last 8760 hours.  Assessment and Plan: History of kidney cancer, DM II, eczema, OSA on CPAP, thyroid disease and multiple myeloma. Pt had bone marrow biopsy 10/10/21 that resulted multiple myeloma diagnosis. Pt was referred by Dr. Jeanie Sewer for tunneled catheter with port  placement to continue chemotherapy.   Pt is A&O, calm and pleasant.  Pt states she is NPO per order. No labs needed today.   Risks and benefits of image guided tunneled catheter with port placement was discussed with the patient including, but not limited to bleeding, infection, pneumothorax, or fibrin sheath development and need for additional procedures.  All of the patient's questions were answered, patient is agreeable to proceed. Consent signed and in chart.   Thank you for this interesting consult.  I greatly enjoyed meeting MAKENZY LATHAM and look forward to participating in their care.  A copy of this report was sent to the requesting provider on this date.  Electronically Signed: Shon Hough, NP 02/01/2022, 11:39 AM   I spent a total of 20 minutes in face to face in clinical consultation, greater than 50% of which was counseling/coordinating care for tunneled catheter with port placement.

## 2022-02-01 NOTE — Procedures (Signed)
Pre Procedure Dx: Poor venous access Post Procedural Dx: Same  Successful placement of right IJ approach port-a-cath with tip at the superior caval atrial junction. The catheter is ready for immediate use.  Estimated Blood Loss: Minimal  Complications: None immediate.  Jay Kacper Cartlidge, MD Pager #: [phone]   

## 2022-02-01 NOTE — Discharge Instructions (Signed)
Leave the dressing in place for 24 hours.  You may remove the dressing at 1pm Thursday March 16.  After removing the dressing you may shower.  Do not use any numbing cream for 2 weeks or until your port site is healed.   ?

## 2022-02-03 ENCOUNTER — Inpatient Hospital Stay: Payer: BC Managed Care – PPO

## 2022-02-03 ENCOUNTER — Inpatient Hospital Stay: Payer: BC Managed Care – PPO | Admitting: Dietician

## 2022-02-03 ENCOUNTER — Inpatient Hospital Stay (HOSPITAL_BASED_OUTPATIENT_CLINIC_OR_DEPARTMENT_OTHER): Payer: BC Managed Care – PPO | Admitting: Physician Assistant

## 2022-02-03 ENCOUNTER — Other Ambulatory Visit: Payer: Self-pay

## 2022-02-03 ENCOUNTER — Other Ambulatory Visit: Payer: BC Managed Care – PPO

## 2022-02-03 ENCOUNTER — Ambulatory Visit: Payer: BC Managed Care – PPO

## 2022-02-03 ENCOUNTER — Other Ambulatory Visit: Payer: Self-pay | Admitting: Hematology

## 2022-02-03 VITALS — BP 122/70 | HR 98 | Temp 98.9°F | Resp 18 | Ht 62.0 in | Wt 171.7 lb

## 2022-02-03 DIAGNOSIS — Z95828 Presence of other vascular implants and grafts: Secondary | ICD-10-CM

## 2022-02-03 DIAGNOSIS — C9030 Solitary plasmacytoma not having achieved remission: Secondary | ICD-10-CM | POA: Diagnosis not present

## 2022-02-03 DIAGNOSIS — C9000 Multiple myeloma not having achieved remission: Secondary | ICD-10-CM

## 2022-02-03 LAB — CMP (CANCER CENTER ONLY)
ALT: 15 U/L (ref 0–44)
AST: 18 U/L (ref 15–41)
Albumin: 4.2 g/dL (ref 3.5–5.0)
Alkaline Phosphatase: 84 U/L (ref 38–126)
Anion gap: 12 (ref 5–15)
BUN: 13 mg/dL (ref 8–23)
CO2: 23 mmol/L (ref 22–32)
Calcium: 9.5 mg/dL (ref 8.9–10.3)
Chloride: 104 mmol/L (ref 98–111)
Creatinine: 0.93 mg/dL (ref 0.44–1.00)
GFR, Estimated: >60 mL/min (ref >60)
Glucose, Bld: 106 mg/dL — ABNORMAL HIGH (ref 70–99)
Potassium: 3.8 mmol/L (ref 3.5–5.1)
Sodium: 139 mmol/L (ref 135–145)
Total Bilirubin: 1.6 mg/dL — ABNORMAL HIGH (ref 0.3–1.2)
Total Protein: 6.4 g/dL — ABNORMAL LOW (ref 6.5–8.1)

## 2022-02-03 LAB — CBC WITH DIFFERENTIAL (CANCER CENTER ONLY)
Abs Immature Granulocytes: 0.01 10*3/uL (ref 0.00–0.07)
Basophils Absolute: 0 10*3/uL (ref 0.0–0.1)
Basophils Relative: 0 %
Eosinophils Absolute: 0 10*3/uL (ref 0.0–0.5)
Eosinophils Relative: 0 %
HCT: 32.8 % — ABNORMAL LOW (ref 36.0–46.0)
Hemoglobin: 11.6 g/dL — ABNORMAL LOW (ref 12.0–15.0)
Immature Granulocytes: 0 %
Lymphocytes Relative: 34 %
Lymphs Abs: 1 10*3/uL (ref 0.7–4.0)
MCH: 32.7 pg (ref 26.0–34.0)
MCHC: 35.4 g/dL (ref 30.0–36.0)
MCV: 92.4 fL (ref 80.0–100.0)
Monocytes Absolute: 0.3 10*3/uL (ref 0.1–1.0)
Monocytes Relative: 10 %
Neutro Abs: 1.7 10*3/uL (ref 1.7–7.7)
Neutrophils Relative %: 56 %
Platelet Count: 192 10*3/uL (ref 150–400)
RBC: 3.55 MIL/uL — ABNORMAL LOW (ref 3.87–5.11)
RDW: 19 % — ABNORMAL HIGH (ref 11.5–15.5)
WBC Count: 3.1 10*3/uL — ABNORMAL LOW (ref 4.0–10.5)
nRBC: 0 % (ref 0.0–0.2)

## 2022-02-03 LAB — LACTATE DEHYDROGENASE: LDH: 250 U/L — ABNORMAL HIGH (ref 98–192)

## 2022-02-03 MED ORDER — ZOLEDRONIC ACID 4 MG/100ML IV SOLN
4.0000 mg | Freq: Once | INTRAVENOUS | Status: AC
  Administered 2022-02-03: 4 mg via INTRAVENOUS
  Filled 2022-02-03: qty 100

## 2022-02-03 MED ORDER — DEXTROSE 5 % IV SOLN
70.0000 mg/m2 | Freq: Once | INTRAVENOUS | Status: AC
  Administered 2022-02-03: 130 mg via INTRAVENOUS
  Filled 2022-02-03: qty 60

## 2022-02-03 MED ORDER — SODIUM CHLORIDE 0.9 % IV SOLN: Freq: Once | INTRAVENOUS | Status: DC

## 2022-02-03 MED ORDER — SODIUM CHLORIDE 0.9% FLUSH
10.0000 mL | INTRAVENOUS | Status: DC | PRN
  Administered 2022-02-03: 10 mL via INTRAVENOUS

## 2022-02-03 MED ORDER — SODIUM CHLORIDE 0.9% FLUSH
10.0000 mL | INTRAVENOUS | Status: DC | PRN
  Administered 2022-02-03: 10 mL

## 2022-02-03 MED ORDER — SODIUM CHLORIDE 0.9 % IV SOLN: Freq: Once | INTRAVENOUS | Status: AC

## 2022-02-03 MED ORDER — HEPARIN SOD (PORK) LOCK FLUSH 100 UNIT/ML IV SOLN
500.0000 [IU] | Freq: Once | INTRAVENOUS | Status: AC | PRN
  Administered 2022-02-03: 500 [IU]

## 2022-02-03 NOTE — Progress Notes (Signed)
?Carol [name] ?Telephone:([phone]) [phone]   [phone] ? ?PROGRESS NOTE ? ?Patient Care Team: ?[name] as PCP - General (Physician Assistant) ? ?Hematological/Oncological History ?# Plasmacytoma with Multiple Myeloma  ?07/29/2021: CT cervical spine shows expansile lytic lesion which almost ?completely replaces the normal C2 vertebral body  ?08/24/2021: PET CT scan shows increased metabolic activity at C2 and C7  ?08/25/2021: biopsy of C2 lesion showed finding consistent with plasma cell neoplasm.  ?09/08/2021: start radiation therapy ?09/12/2021: establish care with Dr. Lorenso Courier  ?09/28/2021: end radiation therapy ?10/10/2021: Bone marrow biopsy. Results confirm plasma cell neoplasm consistent with multiple myeloma, kappa restricted.  ?10/26/2021: Cycle 1 Day 1 of VRD ?11/18/2021: Cycle 2 Day 1 of VRD ?11/25/2021: Velcade HELD due to full body rash. Resolved with steroid therapy.  ?12/16/2021: Cycle 1 Day 1 of Dara/Rev/Dex ?12/30/2021: HELD Daratumumab due to toxicity. Patient unable to tolerate. Requested holding the medication  ?01/06/2022: Cycle 1 Day 1 of Kd  ?02/03/2022: Cycle 2 Day 1 of Kd ? ?Interval History:  ?Carol [name] 66 y.o. female with medical history significant for multiple myeloma with plasmacytoma who presents for a follow up visit. The patient's last visit was on 01/20/2022. Today is Cycle 2 Day 1 of Kd.  ? ?On exam today Carol [name] reports she is doing well. She underwent port placement earlier this week on 02/01/2022. She has some discomfort near the port site today when they access it but she did not apply EMLA cream. She reports her energy levels are stable and she is able to complete her daily activities on her own. Her appetite continues to be affected due to the metallic taste. She supplements her diet with one protein shake and milk shake per day. She has lost seven pounds in the last two weeks. She denies nausea, vomiting or abdominal pain. She continues to have soft  stools without any diarrhea. She denies easy bruising or signs of active bleeding.  She denies fevers, chills, night sweats, shortness of breath, chest pain or cough. She has no other complaints. Rest of the 10 point ROS is below. ? ?MEDICAL HISTORY:  ?Past Medical History:  ?Diagnosis Date  ? Allergy   ? Ankle edema   ? both ankles  ? Arthritis   ? hands,knees,elbows  ? Cancer [facility]) 2016  ? tumor kidney  ? Diabetes mellitus   ? type 2   ? Eczema   ? History of kidney stones   ? Hypothyroidism   ? Obesity   ? Sleep apnea   ? cpap machine setting of 3  ? Thyroid disease   ? ? ?SURGICAL HISTORY: ?Past Surgical History:  ?Procedure Laterality Date  ? BREAST BIOPSY Right 2001  ? CHOLECYSTECTOMY  2009  ? open  ? COLONOSCOPY    ? CYSTOSCOPY W/ RETROGRADES Left 11/22/2018  ? Procedure: CYSTOSCOPY WITH RETROGRADE PYELOGRAM;  Surgeon: Ardis Hughs, MD;  [facility];  Service: Urology;  Laterality: Left;  ? CYSTOSCOPY WITH RETROGRADE PYELOGRAM, URETEROSCOPY AND STENT PLACEMENT Right 10/03/2021  ? Procedure: CYSTOSCOPY WITH RETROGRADE PYELOGRAM, URETEROSCOPY,STONE EXTRACTION AND STENT PLACEMENT;  Surgeon: Ceasar Mons, MD;  [facility];  Service: Urology;  Laterality: Right;  ? DILATATION & CURRETTAGE/HYSTEROSCOPY WITH RESECTOCOPE N/A 01/21/2013  ? Procedure: DILATATION & CURETTAGE/HYSTEROSCOPY WITH RESECTOCOPE AND RESECTION OF ENDOMETRIAL;  Surgeon: Selinda Orion, MD;  Location: [facility];  Service: Gynecology;  Laterality: N/A;  ? IR IMAGING GUIDED PORT INSERTION  02/01/2022  ? KNEE  ARTHROSCOPY    ? left   ? POLYPECTOMY    ? POSTERIOR CERVICAL FUSION/FORAMINOTOMY N/A 08/25/2021  ? Procedure: Occiput to Cervical 5 Posterior cervical instrumented fusion with open biopsy of Cervical 2 Mass. Cervical Two Ganglionectomy;  Surgeon: Judith Part, MD;  Location: [facility];  Service: Neurosurgery;  Laterality: N/A;  ? ROBOT ASSISTED PYELOPLASTY Left 11/22/2018  ? Procedure: XI ROBOTIC  ASSISTED ATTEMPTED PYELOPLASTY CONVERTED TO NEPHRECTOMY/LYSIS OF ADHESIONS/UMBILICAL HERNIA REPAIR;  Surgeon: Ardis Hughs, MD;  [facility];  Service: Urology;  Laterality: Left;  ? ROBOTIC ASSITED PARTIAL NEPHRECTOMY Left 06/11/2015  ? Procedure: LEFT ROBOTIC ASSISTED LAPAROSCOPIC  PARTIAL NEPHRECTOMY;  Surgeon: Ardis Hughs, MD;  [facility];  Service: Urology;  Laterality: Left;  ? ROTATOR CUFF REPAIR Left 2011  ? URETERAL BIOPSY Left 02/14/2017  ? Procedure: URETERAL BIOPSY;  Surgeon: Ardis Hughs, MD;  [facility];  Service: Urology;  Laterality: Left;  ? URETEROSCOPY WITH HOLMIUM LASER LITHOTRIPSY Left 02/14/2017  ? Procedure: URETEROSCOPY LITHOTRIPSY, URETERAL BALLOON DILATION;  Surgeon: Ardis Hughs, MD;  [facility];  Service: Urology;  Laterality: Left;  With STENT  ? ? ?SOCIAL HISTORY: ?Social History  ? ?Socioeconomic History  ? Marital status: Married  ?  Spouse name: Not on file  ? Number of children: Not on file  ? Years of education: Not on file  ? Highest education level: Not on file  ?Occupational History  ? Not on file  ?Tobacco Use  ? Smoking status: Never  ? Smokeless tobacco: Never  ?Vaping Use  ? Vaping Use: Never used  ?Substance and Sexual Activity  ? Alcohol use: Yes  ?  Comment: occ  ? Drug use: No  ? Sexual activity: Not on file  ?Other Topics Concern  ? Not on file  ?Social History Narrative  ? Not on file  ? ?Social Determinants of Health  ? ?Financial Resource Strain: Not on file  ?Food Insecurity: Not on file  ?Transportation Needs: Not on file  ?Physical Activity: Not on file  ?Stress: Not on file  ?Social Connections: Not on file  ?Intimate Partner Violence: Not on file  ? ? ?FAMILY HISTORY: ?Family History  ?Problem Relation Age of Onset  ? Cancer Father   ? ? ?ALLERGIES:  is allergic to morphine. ? ?MEDICATIONS:  ?Current Outpatient Medications  ?Medication Sig Dispense Refill  ? acetaminophen (TYLENOL) 500 MG tablet Take 1,000 mg by  mouth every 6 (six) hours as needed for mild pain.    ? acyclovir (ZOVIRAX) 400 MG tablet TAKE 1 TABLET BY MOUTH TWICE A DAY 180 tablet 1  ? allopurinol (ZYLOPRIM) 300 MG tablet Take 1 tablet (300 mg total) by mouth daily. 90 tablet 1  ? Ascorbic Acid (VITAMIN C ADULT GUMMIES PO) Take 1 tablet by mouth daily.    ? aspirin EC 81 MG tablet Take 1 tablet (81 mg total) by mouth daily. Swallow whole. 90 tablet 3  ? atorvastatin (LIPITOR) 20 MG tablet Take 20 mg by mouth at bedtime.    ? atorvastatin (LIPITOR) 40 MG tablet Take 40 mg by mouth daily.    ? Cholecalciferol (VITAMIN D3) 25 MCG (1000 UT) CAPS Take 1,000 Units by mouth daily.    ? dexamethasone (DECADRON) 4 MG tablet Take by mouth.    ? furosemide (LASIX) 20 MG tablet Take 20 mg by mouth daily as needed for fluid or edema.    ? HEMADY 20 MG TABS TAKE 40 MG  BY MOUTH ONCE A WEEK. 2 TABLETS ON DAY OF CHEMO TREATMENT. 60 tablet 3  ? KLOR-CON M20 20 MEQ tablet TAKE 1 TABLET BY MOUTH EVERY DAY 30 tablet 1  ? levothyroxine (SYNTHROID) 50 MCG tablet Take 50 mcg by mouth daily before breakfast.    ? Magnesium Hydroxide (MILK OF MAGNESIA PO) Take by mouth. 2 times a week    ? metFORMIN (GLUCOPHAGE) 500 MG tablet Take 500 mg by mouth daily with breakfast.    ? nystatin (MYCOSTATIN) 100000 UNIT/ML suspension Take 5 mLs (500,000 Units total) by mouth 4 (four) times daily. 60 mL 0  ? ondansetron (ZOFRAN ODT) 4 MG disintegrating tablet Take 1 tablet (4 mg total) by mouth every 8 (eight) hours as needed for nausea or vomiting. 30 tablet 0  ? ondansetron (ZOFRAN) 8 MG tablet Take 1 tablet (8 mg total) by mouth every 8 (eight) hours as needed. 30 tablet 0  ? OZEMPIC, 0.25 OR 0.5 MG/DOSE, 2 MG/1.5ML SOPN Inject 0.5 mg into the skin every Saturday.    ? PROAIR HFA 108 (90 BASE) MCG/ACT inhaler Inhale 2 puffs into the lungs every 4 (four) hours as needed for wheezing or shortness of breath. Uses seasonal  3  ? prochlorperazine (COMPAZINE) 10 MG tablet Take 1 tablet (10 mg total) by  mouth every 6 (six) hours as needed for nausea or vomiting. 30 tablet 0  ? REVLIMID 25 MG capsule TAKE 1 CAPSULE BY MOUTH ONCE DAILY FOR 14 DAYS ON AND 7 DAYS OFF ?[employer] Auth #: [id number]  Date obtained: 1/25/

## 2022-02-03 NOTE — Progress Notes (Signed)
Ok to treat with bili 1.'6mg'$ /dL per [place], [place]. Per pt she took 10 PO dexamethasone at home before treatment today. ?

## 2022-02-03 NOTE — Patient Instructions (Signed)
[facility]ICAL [facility]COLOG[facility]  Discharge Instructions: Thank you for choosing [facility] to provide your oncology and hematology care.   If you have a lab appointment with the Cancer Center, please go directly to the Cancer Center and check in at the registration area.   Wear comfortable clothing and clothing appropriate for easy access to any Portacath or PICC line.   We strive to give you quality time with your provider. You may need to reschedule your appointment if you arrive late (15 or more minutes).  Arriving late affects you and other patients whose appointments are after yours.  Also, if you miss three or more appointments without notifying the office, you may be dismissed from the clinic at the provider's discretion.      For prescription refill requests, have your pharmacy contact our office and allow 72 hours for refills to be completed.    Today you received the following chemotherapy and/or immunotherapy agents: Kyprolis    To help prevent nausea and vomiting after your treatment, we encourage you to take your nausea medication as directed.  BELOW ARE SYMPTOMS THAT SHOULD BE REPORTED IMMEDIATELY: . *FEVER GREATER THAN 100.4 F (38 C) OR HIGHER . *CHILLS OR SWEATING . *NAUSEA AND VOMITING THAT IS NOT CONTROLLED WITH YOUR NAUSEA MEDICATION . *UNUSUAL SHORTNESS OF BREATH . *UNUSUAL BRUISING OR BLEEDING . *URINARY PROBLEMS (pain or burning when urinating, or frequent urination) . *BOWEL PROBLEMS (unusual diarrhea, constipation, pain near the anus) . TENDERNESS IN MOUTH AND THROAT WITH OR WITHOUT PRESENCE OF ULCERS (sore throat, sores in mouth, or a toothache) . UNUSUAL RASH, SWELLING OR PAIN  . UNUSUAL VAGINAL DISCHARGE OR ITCHING   Items with * indicate a potential emergency and should be followed up as soon as possible or go to the Emergency Department if any problems should occur.  Please show the CHEMOTHERAPY ALERT CARD or IMMUNOTHERAPY ALERT  CARD at check-in to the Emergency Department and triage nurse.  Should you have questions after your visit or need to cancel or reschedule your appointment, please contact [facility]  Dept: [phone]  and follow the prompts.  Office hours are 8:00 a.m. to 4:30 p.m. Monday - Friday. Please note that voicemails left after 4:00 p.m. may not be returned until the following business day.  We are closed weekends and major holidays. You have access to a nurse at all times for urgent questions. Please call the main number to the clinic Dept: [phone] and follow the prompts.   For any non-urgent questions, you may also contact your provider using MyChart. We now offer e-Visits for anyone 18 and older to request care online for non-urgent symptoms. For details visit [email or web address].   Also download the MyChart app! Go to the app store, search "MyChart", open the app, select [facility], and log in with your MyChart username and password.  Due to Covid, a mask is required upon entering the hospital/clinic. If you do not have a mask, one will be given to you upon arrival. For doctor visits, patients may have 1 support person aged 18 or older with them. For treatment visits, patients cannot have anyone with them due to current Covid guidelines and our immunocompromised population.   

## 2022-02-04 NOTE — Progress Notes (Signed)
Nutrition Follow-up: ? ?Patient with multiple myeloma with plasmacytoma. She is currently Kyprolis (started 2/17).  ?  ?Met with patient during infusion. She continues to have a good appetite. Patient reports eating several small meals and snacks. She endorses early satiety. Patient recently started eating a bite of prime rib with meals and snacks, reports 5 bites a day. She is eating vanilla ice cream three times/week. Patient tried high calorie Ensure, but did not care for this. She is drinking one Premier Protein and ~60 ounces of water. She denies nausea, vomiting, constipation, diarrhea.  ? ? ?Medications: reviewed ? ?Labs: glucose 106 ? ?Anthropometrics: Weight 171 lb 11.2 oz today  ? ?2/24 - 172 lb 12 oz ?2/17 - 174 lb 12 oz  ?2/3 - 185 lb 4.8 oz  ?1/20 - 188 lb 5 oz ?1/5 - 195 lb 9.6 oz  ? ? ?NUTRITION DIAGNOSIS: Inadequate oral intake improving ? ? ?INTERVENTION:  ?Encouraged small frequent meals and snacks with adequate calories and protein ?Continue drinking premier protein ? ?  ? ?MONITORING, EVALUATION, GOAL: weight trends, intake  ? ? ?NEXT VISIT: Friday April 14 during infusion ? ? ? ?

## 2022-02-05 ENCOUNTER — Encounter: Payer: Self-pay | Admitting: Hematology and Oncology

## 2022-02-05 MED ORDER — LIDOCAINE-PRILOCAINE 2.5-2.5 % EX CREA: 1.0000 "application " | TOPICAL_CREAM | CUTANEOUS | 0 refills | Status: DC | PRN

## 2022-02-10 ENCOUNTER — Inpatient Hospital Stay: Payer: BC Managed Care – PPO

## 2022-02-10 ENCOUNTER — Other Ambulatory Visit: Payer: BC Managed Care – PPO

## 2022-02-10 ENCOUNTER — Other Ambulatory Visit: Payer: Self-pay

## 2022-02-10 ENCOUNTER — Other Ambulatory Visit: Payer: Self-pay | Admitting: Hematology

## 2022-02-10 ENCOUNTER — Ambulatory Visit: Payer: BC Managed Care – PPO | Admitting: Physician Assistant

## 2022-02-10 VITALS — BP 118/60 | HR 88 | Temp 98.1°F | Resp 18

## 2022-02-10 DIAGNOSIS — Z95828 Presence of other vascular implants and grafts: Secondary | ICD-10-CM

## 2022-02-10 DIAGNOSIS — C9000 Multiple myeloma not having achieved remission: Secondary | ICD-10-CM

## 2022-02-10 DIAGNOSIS — C9030 Solitary plasmacytoma not having achieved remission: Secondary | ICD-10-CM | POA: Diagnosis not present

## 2022-02-10 LAB — CBC WITH DIFFERENTIAL (CANCER CENTER ONLY)
Abs Immature Granulocytes: 0.01 10*3/uL (ref 0.00–0.07)
Basophils Absolute: 0 10*3/uL (ref 0.0–0.1)
Basophils Relative: 0 %
Eosinophils Absolute: 0 10*3/uL (ref 0.0–0.5)
Eosinophils Relative: 0 %
HCT: 32.1 % — ABNORMAL LOW (ref 36.0–46.0)
Hemoglobin: 11.2 g/dL — ABNORMAL LOW (ref 12.0–15.0)
Immature Granulocytes: 0 %
Lymphocytes Relative: 14 %
Lymphs Abs: 0.5 10*3/uL — ABNORMAL LOW (ref 0.7–4.0)
MCH: 32.9 pg (ref 26.0–34.0)
MCHC: 34.9 g/dL (ref 30.0–36.0)
MCV: 94.4 fL (ref 80.0–100.0)
Monocytes Absolute: 0.1 10*3/uL (ref 0.1–1.0)
Monocytes Relative: 4 %
Neutro Abs: 2.7 10*3/uL (ref 1.7–7.7)
Neutrophils Relative %: 82 %
Platelet Count: 99 10*3/uL — ABNORMAL LOW (ref 150–400)
RBC: 3.4 MIL/uL — ABNORMAL LOW (ref 3.87–5.11)
RDW: 19 % — ABNORMAL HIGH (ref 11.5–15.5)
WBC Count: 3.3 10*3/uL — ABNORMAL LOW (ref 4.0–10.5)
nRBC: 0 % (ref 0.0–0.2)

## 2022-02-10 LAB — CMP (CANCER CENTER ONLY)
ALT: 12 U/L (ref 0–44)
AST: 15 U/L (ref 15–41)
Albumin: 4.1 g/dL (ref 3.5–5.0)
Alkaline Phosphatase: 77 U/L (ref 38–126)
Anion gap: 9 (ref 5–15)
BUN: 10 mg/dL (ref 8–23)
CO2: 22 mmol/L (ref 22–32)
Calcium: 8.1 mg/dL — ABNORMAL LOW (ref 8.9–10.3)
Chloride: 108 mmol/L (ref 98–111)
Creatinine: 0.97 mg/dL (ref 0.44–1.00)
GFR, Estimated: >60 mL/min (ref >60)
Glucose, Bld: 162 mg/dL — ABNORMAL HIGH (ref 70–99)
Potassium: 3.8 mmol/L (ref 3.5–5.1)
Sodium: 139 mmol/L (ref 135–145)
Total Bilirubin: 1.4 mg/dL — ABNORMAL HIGH (ref 0.3–1.2)
Total Protein: 5.9 g/dL — ABNORMAL LOW (ref 6.5–8.1)

## 2022-02-10 LAB — LACTATE DEHYDROGENASE: LDH: 277 U/L — ABNORMAL HIGH (ref 98–192)

## 2022-02-10 MED ORDER — HEPARIN SOD (PORK) LOCK FLUSH 100 UNIT/ML IV SOLN
500.0000 [IU] | Freq: Once | INTRAVENOUS | Status: AC | PRN
  Administered 2022-02-10: 500 [IU]

## 2022-02-10 MED ORDER — SODIUM CHLORIDE 0.9 % IV SOLN: Freq: Once | INTRAVENOUS | Status: AC

## 2022-02-10 MED ORDER — DEXTROSE 5 % IV SOLN
70.0000 mg/m2 | Freq: Once | INTRAVENOUS | Status: AC
  Administered 2022-02-10: 130 mg via INTRAVENOUS
  Filled 2022-02-10: qty 60

## 2022-02-10 MED ORDER — SODIUM CHLORIDE 0.9 % IV SOLN
40.0000 mg | Freq: Once | INTRAVENOUS | Status: DC
  Filled 2022-02-10: qty 4

## 2022-02-10 MED ORDER — SODIUM CHLORIDE 0.9% FLUSH
10.0000 mL | INTRAVENOUS | Status: DC | PRN
  Administered 2022-02-10: 10 mL

## 2022-02-10 MED ORDER — SODIUM CHLORIDE 0.9% FLUSH
10.0000 mL | INTRAVENOUS | Status: DC | PRN
  Administered 2022-02-10: 10 mL via INTRAVENOUS

## 2022-02-10 NOTE — Patient Instructions (Signed)
[facility]  Discharge Instructions: ?Thank you for choosing [facility] to provide your oncology and hematology care.  ? ?If you have a lab appointment with the [facility], please go directly to the [facility] and check in at the registration area. ?  ?Wear comfortable clothing and clothing appropriate for easy access to any Portacath or PICC line.  ? ?We strive to give you quality time with your provider. You may need to reschedule your appointment if you arrive late (15 or more minutes).  Arriving late affects you and other patients whose appointments are after yours.  Also, if you miss three or more appointments without notifying the office, you may be dismissed from the clinic at the provider?s discretion.    ?  ?For prescription refill requests, have your pharmacy contact our office and allow 72 hours for refills to be completed.   ? ?Today you received the following chemotherapy and/or immunotherapy agent: Kyprolis    ?  ?To help prevent nausea and vomiting after your treatment, we encourage you to take your nausea medication as directed. ? ?BELOW ARE SYMPTOMS THAT SHOULD BE REPORTED IMMEDIATELY: ?*FEVER GREATER THAN 100.4 F (38 ?C) OR HIGHER ?*CHILLS OR SWEATING ?*NAUSEA AND VOMITING THAT IS NOT CONTROLLED WITH YOUR NAUSEA MEDICATION ?*UNUSUAL SHORTNESS OF BREATH ?*UNUSUAL BRUISING OR BLEEDING ?*URINARY PROBLEMS (pain or burning when urinating, or frequent urination) ?*BOWEL PROBLEMS (unusual diarrhea, constipation, pain near the anus) ?TENDERNESS IN MOUTH AND THROAT WITH OR WITHOUT PRESENCE OF ULCERS (sore throat, sores in mouth, or a toothache) ?UNUSUAL RASH, SWELLING OR PAIN  ?UNUSUAL VAGINAL DISCHARGE OR ITCHING  ? ?Items with * indicate a potential emergency and should be followed up as soon as possible or go to the Emergency Department if any problems should occur. ? ?Please show the CHEMOTHERAPY ALERT CARD or IMMUNOTHERAPY ALERT CARD at check-in to  the Emergency Department and triage nurse. ? ?Should you have questions after your visit or need to cancel or reschedule your appointment, please contact [facility]  Dept: [phone]  and follow the prompts.  Office hours are 8:00 a.m. to 4:30 p.m. Monday - Friday. Please note that voicemails left after 4:00 p.m. may not be returned until the following business day.  We are closed weekends and major holidays. You have access to a nurse at all times for urgent questions. Please call the main number to the clinic Dept: [phone] and follow the prompts. ? ? ?For any non-urgent questions, you may also contact your provider using MyChart. We now offer e-Visits for anyone 56 and older to request care online for non-urgent symptoms. For details visit [email or web address]. ?  ?Also download the MyChart app! Go to the app store, search "MyChart", open the app, select , and log in with your MyChart username and password. ? ?Due to Covid, a mask is required upon entering the hospital/clinic. If you do not have a mask, one will be given to you upon arrival. For doctor visits, patients may have 1 support person aged 63 or older with them. For treatment visits, patients cannot have anyone with them due to current Covid guidelines and our immunocompromised population.  ? ?

## 2022-02-10 NOTE — Progress Notes (Signed)
Per Dr. Alen Blew, ok to proceed with platelet count 99 K/uL. ?

## 2022-02-13 LAB — KAPPA/LAMBDA LIGHT CHAINS
Kappa free light chain: 3.6 mg/L (ref 3.3–19.4)
Kappa, lambda light chain ratio: 1.71 — ABNORMAL HIGH (ref 0.26–1.65)
Lambda free light chains: 2.1 mg/L — ABNORMAL LOW (ref 5.7–26.3)

## 2022-02-14 ENCOUNTER — Other Ambulatory Visit: Payer: Self-pay | Admitting: Neurological Surgery

## 2022-02-14 DIAGNOSIS — S12101A Unspecified nondisplaced fracture of second cervical vertebra, initial encounter for closed fracture: Secondary | ICD-10-CM

## 2022-02-15 LAB — MULTIPLE MYELOMA PANEL, SERUM
Albumin SerPl Elph-Mcnc: 3.6 g/dL (ref 2.9–4.4)
Albumin/Glob SerPl: 1.9 — ABNORMAL HIGH (ref 0.7–1.7)
Alpha 1: 0.2 g/dL (ref 0.0–0.4)
Alpha2 Glob SerPl Elph-Mcnc: 0.7 g/dL (ref 0.4–1.0)
B-Globulin SerPl Elph-Mcnc: 0.8 g/dL (ref 0.7–1.3)
Gamma Glob SerPl Elph-Mcnc: 0.2 g/dL — ABNORMAL LOW (ref 0.4–1.8)
Globulin, Total: 1.9 g/dL — ABNORMAL LOW (ref 2.2–3.9)
IgA: 10 mg/dL — ABNORMAL LOW (ref 87–352)
IgG (Immunoglobin G), Serum: 244 mg/dL — ABNORMAL LOW (ref 586–1602)
IgM (Immunoglobulin M), Srm: <5 mg/dL — ABNORMAL LOW (ref 26–217)
Total Protein ELP: 5.5 g/dL — ABNORMAL LOW (ref 6.0–8.5)

## 2022-02-17 ENCOUNTER — Inpatient Hospital Stay: Payer: BC Managed Care – PPO

## 2022-02-17 ENCOUNTER — Inpatient Hospital Stay (HOSPITAL_BASED_OUTPATIENT_CLINIC_OR_DEPARTMENT_OTHER): Payer: BC Managed Care – PPO | Admitting: Hematology and Oncology

## 2022-02-17 ENCOUNTER — Other Ambulatory Visit: Payer: Self-pay

## 2022-02-17 ENCOUNTER — Other Ambulatory Visit: Payer: BC Managed Care – PPO

## 2022-02-17 ENCOUNTER — Ambulatory Visit: Payer: BC Managed Care – PPO

## 2022-02-17 VITALS — BP 107/54 | HR 82 | Temp 98.7°F | Wt 171.9 lb

## 2022-02-17 DIAGNOSIS — Z95828 Presence of other vascular implants and grafts: Secondary | ICD-10-CM

## 2022-02-17 DIAGNOSIS — C9000 Multiple myeloma not having achieved remission: Secondary | ICD-10-CM | POA: Diagnosis not present

## 2022-02-17 DIAGNOSIS — C9030 Solitary plasmacytoma not having achieved remission: Secondary | ICD-10-CM | POA: Diagnosis not present

## 2022-02-17 LAB — CMP (CANCER CENTER ONLY)
ALT: 9 U/L (ref 0–44)
AST: 13 U/L — ABNORMAL LOW (ref 15–41)
Albumin: 3.9 g/dL (ref 3.5–5.0)
Alkaline Phosphatase: 65 U/L (ref 38–126)
Anion gap: 9 (ref 5–15)
BUN: 12 mg/dL (ref 8–23)
CO2: 24 mmol/L (ref 22–32)
Calcium: 9.2 mg/dL (ref 8.9–10.3)
Chloride: 109 mmol/L (ref 98–111)
Creatinine: 0.82 mg/dL (ref 0.44–1.00)
GFR, Estimated: >60 mL/min (ref >60)
Glucose, Bld: 164 mg/dL — ABNORMAL HIGH (ref 70–99)
Potassium: 3.9 mmol/L (ref 3.5–5.1)
Sodium: 142 mmol/L (ref 135–145)
Total Bilirubin: 1.4 mg/dL — ABNORMAL HIGH (ref 0.3–1.2)
Total Protein: 5.7 g/dL — ABNORMAL LOW (ref 6.5–8.1)

## 2022-02-17 LAB — CBC WITH DIFFERENTIAL (CANCER CENTER ONLY)
Abs Immature Granulocytes: 0.01 10*3/uL (ref 0.00–0.07)
Basophils Absolute: 0 10*3/uL (ref 0.0–0.1)
Basophils Relative: 0 %
Eosinophils Absolute: 0 10*3/uL (ref 0.0–0.5)
Eosinophils Relative: 0 %
HCT: 29.3 % — ABNORMAL LOW (ref 36.0–46.0)
Hemoglobin: 10.3 g/dL — ABNORMAL LOW (ref 12.0–15.0)
Immature Granulocytes: 0 %
Lymphocytes Relative: 13 %
Lymphs Abs: 0.5 10*3/uL — ABNORMAL LOW (ref 0.7–4.0)
MCH: 33.1 pg (ref 26.0–34.0)
MCHC: 35.2 g/dL (ref 30.0–36.0)
MCV: 94.2 fL (ref 80.0–100.0)
Monocytes Absolute: 0.1 10*3/uL (ref 0.1–1.0)
Monocytes Relative: 4 %
Neutro Abs: 2.8 10*3/uL (ref 1.7–7.7)
Neutrophils Relative %: 83 %
Platelet Count: 84 10*3/uL — ABNORMAL LOW (ref 150–400)
RBC: 3.11 MIL/uL — ABNORMAL LOW (ref 3.87–5.11)
RDW: 18.3 % — ABNORMAL HIGH (ref 11.5–15.5)
WBC Count: 3.4 10*3/uL — ABNORMAL LOW (ref 4.0–10.5)
nRBC: 0 % (ref 0.0–0.2)

## 2022-02-17 LAB — LACTATE DEHYDROGENASE: LDH: 246 U/L — ABNORMAL HIGH (ref 98–192)

## 2022-02-17 MED ORDER — SODIUM CHLORIDE 0.9 % IV SOLN: Freq: Once | INTRAVENOUS | Status: AC

## 2022-02-17 MED ORDER — SODIUM CHLORIDE 0.9% FLUSH
10.0000 mL | INTRAVENOUS | Status: DC | PRN
  Administered 2022-02-17: 10 mL

## 2022-02-17 MED ORDER — DEXTROSE 5 % IV SOLN
70.0000 mg/m2 | Freq: Once | INTRAVENOUS | Status: AC
  Administered 2022-02-17: 130 mg via INTRAVENOUS
  Filled 2022-02-17: qty 60

## 2022-02-17 MED ORDER — HEPARIN SOD (PORK) LOCK FLUSH 100 UNIT/ML IV SOLN
500.0000 [IU] | Freq: Once | INTRAVENOUS | Status: AC | PRN
  Administered 2022-02-17: 500 [IU]

## 2022-02-17 MED ORDER — SODIUM CHLORIDE 0.9% FLUSH
10.0000 mL | INTRAVENOUS | Status: DC | PRN
  Administered 2022-02-17: 10 mL via INTRAVENOUS

## 2022-02-17 NOTE — Progress Notes (Signed)
Per Dr. Lorenso Courier ok for treatment today with platelets 84 K/ul. ?

## 2022-02-17 NOTE — Patient Instructions (Signed)
[facility]  Discharge Instructions: Thank you for choosing [facility] to provide your oncology and hematology care.   If you have a lab appointment with the Cancer Center, please go directly to the Cancer Center and check in at the registration area.   Wear comfortable clothing and clothing appropriate for easy access to any Portacath or PICC line.   We strive to give you quality time with your provider. You may need to reschedule your appointment if you arrive late (15 or more minutes).  Arriving late affects you and other patients whose appointments are after yours.  Also, if you miss three or more appointments without notifying the office, you may be dismissed from the clinic at the provider's discretion.      For prescription refill requests, have your pharmacy contact our office and allow 72 hours for refills to be completed.    Today you received the following chemotherapy and/or immunotherapy agent: Carfilzomib (Kyprolis).    To help prevent nausea and vomiting after your treatment, we encourage you to take your nausea medication as directed.  BELOW ARE SYMPTOMS THAT SHOULD BE REPORTED IMMEDIATELY: *FEVER GREATER THAN 100.4 F (38 C) OR HIGHER *CHILLS OR SWEATING *NAUSEA AND VOMITING THAT IS NOT CONTROLLED WITH YOUR NAUSEA MEDICATION *UNUSUAL SHORTNESS OF BREATH *UNUSUAL BRUISING OR BLEEDING *URINARY PROBLEMS (pain or burning when urinating, or frequent urination) *BOWEL PROBLEMS (unusual diarrhea, constipation, pain near the anus) TENDERNESS IN MOUTH AND THROAT WITH OR WITHOUT PRESENCE OF ULCERS (sore throat, sores in mouth, or a toothache) UNUSUAL RASH, SWELLING OR PAIN  UNUSUAL VAGINAL DISCHARGE OR ITCHING   Items with * indicate a potential emergency and should be followed up as soon as possible or go to the Emergency Department if any problems should occur.  Please show the CHEMOTHERAPY ALERT CARD or IMMUNOTHERAPY ALERT CARD at  check-in to the Emergency Department and triage nurse.  Should you have questions after your visit or need to cancel or reschedule your appointment, please contact [facility]  Dept: [phone]  and follow the prompts.  Office hours are 8:00 a.m. to 4:30 p.m. Monday - Friday. Please note that voicemails left after 4:00 p.m. may not be returned until the following business day.  We are closed weekends and major holidays. You have access to a nurse at all times for urgent questions. Please call the main number to the clinic Dept: [phone] and follow the prompts.   For any non-urgent questions, you may also contact your provider using MyChart. We now offer e-Visits for anyone 18 and older to request care online for non-urgent symptoms. For details visit [email or web address].   Also download the MyChart app! Go to the app store, search "MyChart", open the app, select , and log in with your MyChart username and password.  Due to Covid, a mask is required upon entering the hospital/clinic. If you do not have a mask, one will be given to you upon arrival. For doctor visits, patients may have 1 support person aged 18 or older with them. For treatment visits, patients cannot have anyone with them due to current Covid guidelines and our immunocompromised population.   

## 2022-02-24 ENCOUNTER — Ambulatory Visit: Payer: BC Managed Care – PPO | Admitting: Hematology and Oncology

## 2022-02-24 ENCOUNTER — Other Ambulatory Visit: Payer: BC Managed Care – PPO

## 2022-02-24 ENCOUNTER — Ambulatory Visit: Payer: BC Managed Care – PPO

## 2022-02-26 ENCOUNTER — Encounter: Payer: Self-pay | Admitting: Hematology and Oncology

## 2022-02-26 NOTE — Progress Notes (Signed)
?[facility] ?Telephone:(336) [phone]   [phone] ? ?PROGRESS NOTE ? ?Patient Care Team: ?[name] as PCP - General (Physician Assistant) ? ?Hematological/Oncological History ?# Plasmacytoma with Multiple Myeloma  ?07/29/2021: CT cervical spine shows expansile lytic lesion which almost ?completely replaces the normal C2 vertebral body  ?08/24/2021: PET CT scan shows increased metabolic activity at C2 and C7  ?08/25/2021: biopsy of C2 lesion showed finding consistent with plasma cell neoplasm.  ?09/08/2021: start radiation therapy ?09/12/2021: establish care with Dr. Lorenso Courier  ?09/28/2021: end radiation therapy ?10/10/2021: Bone marrow biopsy. Results confirm plasma cell neoplasm consistent with multiple myeloma, kappa restricted.  ?10/26/2021: Cycle 1 Day 1 of VRD ?11/18/2021: Cycle 2 Day 1 of VRD ?11/25/2021: Velcade HELD due to full body rash. Resolved with steroid therapy.  ?12/16/2021: Cycle 1 Day 1 of Dara/Rev/Dex ?12/30/2021: HELD Daratumumab due to toxicity. Patient unable to tolerate. Requested holding the medication  ?01/06/2022: Cycle 1 Day 1 of Kd  ?02/03/2022: Cycle 2 Day 1 of Kd ? ?Interval History:  ?Carol [name] 66 y.o. female with medical history significant for multiple myeloma with plasmacytoma who presents for a follow up visit. The patient's last visit was on 01/20/2022. Today is Cycle 2 Day 15 of Kd.  ? ?On exam today Carol [name] reports she is tolerating chemotherapy quite well.  She continues to be delighted of the minimal side effect she is experiencing on this regimen.  She reports that she is not having any numbness or tingling of her fingers or toes.  She is also having any nausea,, or diarrhea.  Her appetite remains good.  She continues to talk with neurosurgery about stabilization of her neck.  In the interim she continues to wear a brace.  She denies easy bruising or signs of active bleeding.  She denies fevers, chills, night sweats, shortness of breath, chest  pain or cough. She has no other complaints. Rest of the 10 point ROS is below. ? ?MEDICAL HISTORY:  ?Past Medical History:  ?Diagnosis Date  ? Allergy   ? Ankle edema   ? both ankles  ? Arthritis   ? hands,knees,elbows  ? 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 Diabetes mellitus   ? type 2   ? Eczema   ? History of kidney stones   ? Hypothyroidism   ? Obesity   ? Sleep apnea   ? cpap machine setting of 3  ? Thyroid disease   ? ? ?SURGICAL HISTORY: ?Past Surgical History:  ?Procedure Laterality Date  ? BREAST BIOPSY Right 2001  ? CHOLECYSTECTOMY  2009  ? open  ? COLONOSCOPY    ? CYSTOSCOPY W/ RETROGRADES Left 11/22/2018  ? Procedure: CYSTOSCOPY WITH RETROGRADE PYELOGRAM;  Surgeon: Ardis Hughs, MD;  Location: [facility];  Service: Urology;  Laterality: Left;  ? CYSTOSCOPY WITH RETROGRADE PYELOGRAM, URETEROSCOPY AND STENT PLACEMENT Right 10/03/2021  ? Procedure: CYSTOSCOPY WITH RETROGRADE PYELOGRAM, URETEROSCOPY,STONE EXTRACTION AND STENT PLACEMENT;  Surgeon: Ceasar Mons, MD;  Location: [facility];  Service: Urology;  Laterality: Right;  ? DILATATION & CURRETTAGE/HYSTEROSCOPY WITH RESECTOCOPE N/A 01/21/2013  ? Procedure: DILATATION & CURETTAGE/HYSTEROSCOPY WITH RESECTOCOPE AND RESECTION OF ENDOMETRIAL;  Surgeon: Selinda Orion, MD;  Location: [facility];  Service: Gynecology;  Laterality: N/A;  ? IR IMAGING GUIDED PORT INSERTION  02/01/2022  ? KNEE ARTHROSCOPY    ? left   ? POLYPECTOMY    ? POSTERIOR CERVICAL FUSION/FORAMINOTOMY N/A 08/25/2021  ? Procedure: Occiput to Cervical  5 Posterior cervical instrumented fusion with open biopsy of Cervical 2 Mass. Cervical Two Ganglionectomy;  Surgeon: Judith Part, MD;  Location: [facility];  Service: Neurosurgery;  Laterality: N/A;  ? ROBOT ASSISTED PYELOPLASTY Left 11/22/2018  ? Procedure: XI ROBOTIC ASSISTED ATTEMPTED PYELOPLASTY CONVERTED TO NEPHRECTOMY/LYSIS OF ADHESIONS/UMBILICAL HERNIA REPAIR;  Surgeon: Ardis Hughs, MD;  Location: [facility];   Service: Urology;  Laterality: Left;  ? ROBOTIC ASSITED PARTIAL NEPHRECTOMY Left 06/11/2015  ? Procedure: LEFT ROBOTIC ASSISTED LAPAROSCOPIC  PARTIAL NEPHRECTOMY;  Surgeon: Ardis Hughs, MD;  Location: [facility];  Service: Urology;  Laterality: Left;  ? ROTATOR CUFF REPAIR Left 2011  ? URETERAL BIOPSY Left 02/14/2017  ? Procedure: URETERAL BIOPSY;  Surgeon: Ardis Hughs, MD;  Location: [facility];  Service: Urology;  Laterality: Left;  ? URETEROSCOPY WITH HOLMIUM LASER LITHOTRIPSY Left 02/14/2017  ? Procedure: URETEROSCOPY LITHOTRIPSY, URETERAL BALLOON DILATION;  Surgeon: Ardis Hughs, MD;  Location: [facility];  Service: Urology;  Laterality: Left;  With STENT  ? ? ?SOCIAL HISTORY: ?Social History  ? ?Socioeconomic History  ? Marital status: Married  ?  Spouse name: Not on file  ? Number of children: Not on file  ? Years of education: Not on file  ? Highest education level: Not on file  ?Occupational History  ? Not on file  ?Tobacco Use  ? Smoking status: Never  ? Smokeless tobacco: Never  ?Vaping Use  ? Vaping Use: Never used  ?Substance and Sexual Activity  ? Alcohol use: Yes  ?  Comment: occ  ? Drug use: No  ? Sexual activity: Not on file  ?Other Topics Concern  ? Not on file  ?Social History Narrative  ? Not on file  ? ?Social Determinants of Health  ? ?Financial Resource Strain: Not on file  ?Food Insecurity: Not on file  ?Transportation Needs: Not on file  ?Physical Activity: Not on file  ?Stress: Not on file  ?Social Connections: Not on file  ?Intimate Partner Violence: Not on file  ? ? ?FAMILY HISTORY: ?Family History  ?Problem Relation Age of Onset  ? Cancer Father   ? ? ?ALLERGIES:  is allergic to morphine. ? ?MEDICATIONS:  ?Current Outpatient Medications  ?Medication Sig Dispense Refill  ? acetaminophen (TYLENOL) 500 MG tablet Take 1,000 mg by mouth every 6 (six) hours as needed for mild pain.    ? acyclovir (ZOVIRAX) 400 MG tablet TAKE 1 TABLET BY MOUTH TWICE A DAY 180 tablet 1  ? allopurinol  (ZYLOPRIM) 300 MG tablet Take 1 tablet (300 mg total) by mouth daily. 90 tablet 1  ? Ascorbic Acid (VITAMIN C ADULT GUMMIES PO) Take 1 tablet by mouth daily.    ? aspirin EC 81 MG tablet Take 1 tablet (81 mg total) by mouth daily. Swallow whole. 90 tablet 3  ? atorvastatin (LIPITOR) 20 MG tablet Take 20 mg by mouth at bedtime.    ? atorvastatin (LIPITOR) 40 MG tablet Take 40 mg by mouth daily.    ? Cholecalciferol (VITAMIN D3) 25 MCG (1000 UT) CAPS Take 1,000 Units by mouth daily.    ? dexamethasone (DECADRON) 4 MG tablet Take by mouth.    ? furosemide (LASIX) 20 MG tablet Take 20 mg by mouth daily as needed for fluid or edema.    ? HEMADY 20 MG TABS TAKE 40 MG BY MOUTH ONCE A WEEK. 2 TABLETS ON DAY OF CHEMO TREATMENT. 60 tablet 3  ? KLOR-CON M20 20 MEQ tablet TAKE 1 TABLET  BY MOUTH EVERY DAY 30 tablet 1  ? levothyroxine (SYNTHROID) 50 MCG tablet Take 50 mcg by mouth daily before breakfast.    ? lidocaine-prilocaine (EMLA) cream Apply 1 application. topically as needed. 30 g 0  ? Magnesium Hydroxide (MILK OF MAGNESIA PO) Take by mouth. 2 times a week    ? metFORMIN (GLUCOPHAGE) 500 MG tablet Take 500 mg by mouth daily with breakfast.    ? nystatin (MYCOSTATIN) 100000 UNIT/ML suspension Take 5 mLs (500,000 Units total) by mouth 4 (four) times daily. 60 mL 0  ? ondansetron (ZOFRAN ODT) 4 MG disintegrating tablet Take 1 tablet (4 mg total) by mouth every 8 (eight) hours as needed for nausea or vomiting. 30 tablet 0  ? ondansetron (ZOFRAN) 8 MG tablet Take 1 tablet (8 mg total) by mouth every 8 (eight) hours as needed. 30 tablet 0  ? OZEMPIC, 0.25 OR 0.5 MG/DOSE, 2 MG/1.5ML SOPN Inject 0.5 mg into the skin every Saturday.    ? PROAIR HFA 108 (90 BASE) MCG/ACT inhaler Inhale 2 puffs into the lungs every 4 (four) hours as needed for wheezing or shortness of breath. Uses seasonal  3  ? prochlorperazine (COMPAZINE) 10 MG tablet Take 1 tablet (10 mg total) by mouth every 6 (six) hours as needed for nausea or vomiting. 30  tablet 0  ? REVLIMID 25 MG capsule TAKE 1 CAPSULE BY MOUTH ONCE DAILY FOR 14 DAYS ON AND 7 DAYS OFF ?[employer] Auth #: [id number]  Date obtained: 12/14/21 14 capsule 0  ? Sennosides (SENOKOT PO) Take by mouth. Bid    ? ?

## 2022-02-27 ENCOUNTER — Other Ambulatory Visit: Payer: Self-pay | Admitting: Physician Assistant

## 2022-03-01 ENCOUNTER — Encounter: Payer: Self-pay | Admitting: Hematology and Oncology

## 2022-03-03 ENCOUNTER — Other Ambulatory Visit: Payer: BC Managed Care – PPO

## 2022-03-03 ENCOUNTER — Inpatient Hospital Stay: Payer: BC Managed Care – PPO | Admitting: Dietician

## 2022-03-03 ENCOUNTER — Inpatient Hospital Stay: Payer: BC Managed Care – PPO

## 2022-03-03 ENCOUNTER — Other Ambulatory Visit: Payer: Self-pay | Admitting: Hematology and Oncology

## 2022-03-03 ENCOUNTER — Other Ambulatory Visit: Payer: Self-pay

## 2022-03-03 ENCOUNTER — Inpatient Hospital Stay: Payer: BC Managed Care – PPO | Attending: Radiation Oncology | Admitting: Physician Assistant

## 2022-03-03 VITALS — BP 107/61 | HR 61 | Temp 97.7°F | Resp 16 | Ht 62.0 in | Wt 169.9 lb

## 2022-03-03 DIAGNOSIS — C9000 Multiple myeloma not having achieved remission: Secondary | ICD-10-CM

## 2022-03-03 DIAGNOSIS — Z95828 Presence of other vascular implants and grafts: Secondary | ICD-10-CM

## 2022-03-03 DIAGNOSIS — Z79899 Other long term (current) drug therapy: Secondary | ICD-10-CM | POA: Insufficient documentation

## 2022-03-03 DIAGNOSIS — Z5112 Encounter for antineoplastic immunotherapy: Secondary | ICD-10-CM | POA: Insufficient documentation

## 2022-03-03 DIAGNOSIS — C9030 Solitary plasmacytoma not having achieved remission: Secondary | ICD-10-CM | POA: Diagnosis not present

## 2022-03-03 LAB — CMP (CANCER CENTER ONLY)
ALT: 20 U/L (ref 0–44)
AST: 23 U/L (ref 15–41)
Albumin: 4.1 g/dL (ref 3.5–5.0)
Alkaline Phosphatase: 79 U/L (ref 38–126)
Anion gap: 5 (ref 5–15)
BUN: 16 mg/dL (ref 8–23)
CO2: 26 mmol/L (ref 22–32)
Calcium: 9.3 mg/dL (ref 8.9–10.3)
Chloride: 108 mmol/L (ref 98–111)
Creatinine: 0.8 mg/dL (ref 0.44–1.00)
GFR, Estimated: >60 mL/min (ref >60)
Glucose, Bld: 172 mg/dL — ABNORMAL HIGH (ref 70–99)
Potassium: 4.3 mmol/L (ref 3.5–5.1)
Sodium: 139 mmol/L (ref 135–145)
Total Bilirubin: 1.1 mg/dL (ref 0.3–1.2)
Total Protein: 6.1 g/dL — ABNORMAL LOW (ref 6.5–8.1)

## 2022-03-03 LAB — CBC WITH DIFFERENTIAL (CANCER CENTER ONLY)
Abs Immature Granulocytes: 0 10*3/uL (ref 0.00–0.07)
Basophils Absolute: 0 10*3/uL (ref 0.0–0.1)
Basophils Relative: 0 %
Eosinophils Absolute: 0 10*3/uL (ref 0.0–0.5)
Eosinophils Relative: 0 %
HCT: 31.2 % — ABNORMAL LOW (ref 36.0–46.0)
Hemoglobin: 10.6 g/dL — ABNORMAL LOW (ref 12.0–15.0)
Immature Granulocytes: 0 %
Lymphocytes Relative: 17 %
Lymphs Abs: 0.4 10*3/uL — ABNORMAL LOW (ref 0.7–4.0)
MCH: 33.5 pg (ref 26.0–34.0)
MCHC: 34 g/dL (ref 30.0–36.0)
MCV: 98.7 fL (ref 80.0–100.0)
Monocytes Absolute: 0.1 10*3/uL (ref 0.1–1.0)
Monocytes Relative: 3 %
Neutro Abs: 1.9 10*3/uL (ref 1.7–7.7)
Neutrophils Relative %: 80 %
Platelet Count: 158 10*3/uL (ref 150–400)
RBC: 3.16 MIL/uL — ABNORMAL LOW (ref 3.87–5.11)
RDW: 17.9 % — ABNORMAL HIGH (ref 11.5–15.5)
WBC Count: 2.4 10*3/uL — ABNORMAL LOW (ref 4.0–10.5)
nRBC: 0 % (ref 0.0–0.2)

## 2022-03-03 LAB — LACTATE DEHYDROGENASE: LDH: 252 U/L — ABNORMAL HIGH (ref 98–192)

## 2022-03-03 MED ORDER — SODIUM CHLORIDE 0.9 % IV SOLN: Freq: Once | INTRAVENOUS | Status: AC

## 2022-03-03 MED ORDER — SODIUM CHLORIDE 0.9% FLUSH
10.0000 mL | INTRAVENOUS | Status: DC | PRN
  Administered 2022-03-03: 10 mL via INTRAVENOUS

## 2022-03-03 MED ORDER — SODIUM CHLORIDE 0.9 % IV SOLN: 40.0000 mg | Freq: Once | INTRAVENOUS | Status: DC

## 2022-03-03 MED ORDER — DEXTROSE 5 % IV SOLN
70.0000 mg/m2 | Freq: Once | INTRAVENOUS | Status: AC
  Administered 2022-03-03: 130 mg via INTRAVENOUS
  Filled 2022-03-03: qty 60

## 2022-03-03 MED ORDER — SODIUM CHLORIDE 0.9% FLUSH
10.0000 mL | INTRAVENOUS | Status: DC | PRN
  Administered 2022-03-03: 10 mL

## 2022-03-03 MED ORDER — HEPARIN SOD (PORK) LOCK FLUSH 100 UNIT/ML IV SOLN
500.0000 [IU] | Freq: Once | INTRAVENOUS | Status: AC | PRN
  Administered 2022-03-03: 500 [IU]

## 2022-03-03 NOTE — Progress Notes (Signed)
?[name] ?Telephone:(336) [phone]   [phone] ? ?PROGRESS NOTE ? ?Patient Care Team: ?[name] as PCP - General (Physician Assistant) ? ?Hematological/Oncological History ?# Plasmacytoma with Multiple Myeloma  ?07/29/2021: CT cervical spine shows expansile lytic lesion which almost ?completely replaces the normal C2 vertebral body  ?08/24/2021: PET CT scan shows increased metabolic activity at C2 and C7  ?08/25/2021: biopsy of C2 lesion showed finding consistent with plasma cell neoplasm.  ?09/08/2021: start radiation therapy ?09/12/2021: establish care with Dr. Lorenso Courier  ?09/28/2021: end radiation therapy ?10/10/2021: Bone marrow biopsy. Results confirm plasma cell neoplasm consistent with multiple myeloma, kappa restricted.  ?10/26/2021: Cycle 1 Day 1 of VRD ?11/18/2021: Cycle 2 Day 1 of VRD ?11/25/2021: Velcade HELD due to full body rash. Resolved with steroid therapy.  ?12/16/2021: Cycle 1 Day 1 of Dara/Rev/Dex ?12/30/2021: HELD Daratumumab due to toxicity. Patient unable to tolerate. Requested holding the medication  ?01/06/2022: Cycle 1 Day 1 of Kd  ?02/03/2022: Cycle 2 Day 1 of Kd ?03/03/2022: Cycle 3 Day 1 of Kd ? ?Interval History:  ?Carol [name] 66 y.o. female with medical history significant for multiple myeloma with plasmacytoma who presents for a follow up visit. The patient's last visit was on 01/20/2022. Today is Cycle 3 Day 1 of Kd.  ? ?On exam today Carol [name] reports she continues to tolerate chemotherapy quite well.  She is able to complete all her daily activities on her own.  Her she is eating better and has a good appetite.  She denies any nausea, vomiting or abdominal pain.  She has intermittent episodes of diarrhea versus constipation that is managed with supportive medication as needed.  She continues to wear her neck brace and plans to speak with her neurosurgeon about additional surgery for her neck.  She denies easy bruising or signs of active bleeding.She  denies fevers, chills, night sweats, shortness of breath, chest pain or cough. She has no other complaints. Rest of the 10 point ROS is below. ? ?MEDICAL HISTORY:  ?Past Medical History:  ?Diagnosis Date  ? Allergy   ? Ankle edema   ? both ankles  ? Arthritis   ? hands,knees,elbows  ? 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 Diabetes mellitus   ? type 2   ? Eczema   ? History of kidney stones   ? Hypothyroidism   ? Obesity   ? Sleep apnea   ? cpap machine setting of 3  ? Thyroid disease   ? ? ?SURGICAL HISTORY: ?Past Surgical History:  ?Procedure Laterality Date  ? BREAST BIOPSY Right 2001  ? CHOLECYSTECTOMY  2009  ? open  ? COLONOSCOPY    ? CYSTOSCOPY W/ RETROGRADES Left 11/22/2018  ? Procedure: CYSTOSCOPY WITH RETROGRADE PYELOGRAM;  Surgeon: Ardis Hughs, MD;  [facility];  Service: Urology;  Laterality: Left;  ? CYSTOSCOPY WITH RETROGRADE PYELOGRAM, URETEROSCOPY AND STENT PLACEMENT Right 10/03/2021  ? Procedure: CYSTOSCOPY WITH RETROGRADE PYELOGRAM, URETEROSCOPY,STONE EXTRACTION AND STENT PLACEMENT;  Surgeon: Ceasar Mons, MD;  [facility];  Service: Urology;  Laterality: Right;  ? DILATATION & CURRETTAGE/HYSTEROSCOPY WITH RESECTOCOPE N/A 01/21/2013  ? Procedure: DILATATION & CURETTAGE/HYSTEROSCOPY WITH RESECTOCOPE AND RESECTION OF ENDOMETRIAL;  Surgeon: Selinda Orion, MD;  Location: [facility];  Service: Gynecology;  Laterality: N/A;  ? IR IMAGING GUIDED PORT INSERTION  02/01/2022  ? KNEE ARTHROSCOPY    ? left   ? POLYPECTOMY    ? POSTERIOR CERVICAL FUSION/FORAMINOTOMY N/A  08/25/2021  ? Procedure: Occiput to Cervical 5 Posterior cervical instrumented fusion with open biopsy of Cervical 2 Mass. Cervical Two Ganglionectomy;  Surgeon: Judith Part, MD;  Location: [facility];  Service: Neurosurgery;  Laterality: N/A;  ? ROBOT ASSISTED PYELOPLASTY Left 11/22/2018  ? Procedure: XI ROBOTIC ASSISTED ATTEMPTED PYELOPLASTY CONVERTED TO NEPHRECTOMY/LYSIS OF ADHESIONS/UMBILICAL HERNIA  REPAIR;  Surgeon: Ardis Hughs, MD;  [facility];  Service: Urology;  Laterality: Left;  ? ROBOTIC ASSITED PARTIAL NEPHRECTOMY Left 06/11/2015  ? Procedure: LEFT ROBOTIC ASSISTED LAPAROSCOPIC  PARTIAL NEPHRECTOMY;  Surgeon: Ardis Hughs, MD;  [facility];  Service: Urology;  Laterality: Left;  ? ROTATOR CUFF REPAIR Left 2011  ? URETERAL BIOPSY Left 02/14/2017  ? Procedure: URETERAL BIOPSY;  Surgeon: Ardis Hughs, MD;  [facility];  Service: Urology;  Laterality: Left;  ? URETEROSCOPY WITH HOLMIUM LASER LITHOTRIPSY Left 02/14/2017  ? Procedure: URETEROSCOPY LITHOTRIPSY, URETERAL BALLOON DILATION;  Surgeon: Ardis Hughs, MD;  [facility];  Service: Urology;  Laterality: Left;  With STENT  ? ? ?SOCIAL HISTORY: ?Social History  ? ?Socioeconomic History  ? Marital status: Married  ?  Spouse name: Not on file  ? Number of children: Not on file  ? Years of education: Not on file  ? Highest education level: Not on file  ?Occupational History  ? Not on file  ?Tobacco Use  ? Smoking status: Never  ? Smokeless tobacco: Never  ?Vaping Use  ? Vaping Use: Never used  ?Substance and Sexual Activity  ? Alcohol use: Yes  ?  Comment: occ  ? Drug use: No  ? Sexual activity: Not on file  ?Other Topics Concern  ? Not on file  ?Social History Narrative  ? Not on file  ? ?Social Determinants of Health  ? ?Financial Resource Strain: Not on file  ?Food Insecurity: Not on file  ?Transportation Needs: Not on file  ?Physical Activity: Not on file  ?Stress: Not on file  ?Social Connections: Not on file  ?Intimate Partner Violence: Not on file  ? ? ?FAMILY HISTORY: ?Family History  ?Problem Relation Age of Onset  ? Cancer Father   ? ? ?ALLERGIES:  is allergic to morphine. ? ?MEDICATIONS:  ?Current Outpatient Medications  ?Medication Sig Dispense Refill  ? acetaminophen (TYLENOL) 500 MG tablet Take 1,000 mg by mouth every 6 (six) hours as needed for mild pain.    ? acyclovir (ZOVIRAX) 400 MG tablet TAKE 1  TABLET BY MOUTH TWICE A DAY 180 tablet 1  ? allopurinol (ZYLOPRIM) 300 MG tablet Take 1 tablet (300 mg total) by mouth daily. 90 tablet 1  ? Ascorbic Acid (VITAMIN C ADULT GUMMIES PO) Take 1 tablet by mouth daily.    ? aspirin EC 81 MG tablet Take 1 tablet (81 mg total) by mouth daily. Swallow whole. 90 tablet 3  ? atorvastatin (LIPITOR) 20 MG tablet Take 20 mg by mouth at bedtime.    ? Cholecalciferol (VITAMIN D3) 25 MCG (1000 UT) CAPS Take 1,000 Units by mouth daily.    ? dexamethasone (DECADRON) 4 MG tablet Take by mouth.    ? furosemide (LASIX) 20 MG tablet Take 20 mg by mouth daily as needed for fluid or edema.    ? HEMADY 20 MG TABS TAKE 40 MG BY MOUTH ONCE A WEEK. 2 TABLETS ON DAY OF CHEMO TREATMENT. 60 tablet 3  ? KLOR-CON M20 20 MEQ tablet TAKE 1 TABLET BY MOUTH EVERY DAY 30 tablet 1  ?  levothyroxine (SYNTHROID) 50 MCG tablet Take 50 mcg by mouth daily before breakfast.    ? lidocaine-prilocaine (EMLA) cream Apply 1 application. topically as needed. 30 g 0  ? Magnesium Hydroxide (MILK OF MAGNESIA PO) Take by mouth. 2 times a week    ? metFORMIN (GLUCOPHAGE) 500 MG tablet Take 500 mg by mouth daily with breakfast.    ? nystatin (MYCOSTATIN) 100000 UNIT/ML suspension Take 5 mLs (500,000 Units total) by mouth 4 (four) times daily. 60 mL 0  ? ondansetron (ZOFRAN ODT) 4 MG disintegrating tablet Take 1 tablet (4 mg total) by mouth every 8 (eight) hours as needed for nausea or vomiting. 30 tablet 0  ? ondansetron (ZOFRAN) 8 MG tablet Take 1 tablet (8 mg total) by mouth every 8 (eight) hours as needed. 30 tablet 0  ? OZEMPIC, 0.25 OR 0.5 MG/DOSE, 2 MG/1.5ML SOPN Inject 0.5 mg into the skin every Saturday.    ? PROAIR HFA 108 (90 BASE) MCG/ACT inhaler Inhale 2 puffs into the lungs every 4 (four) hours as needed for wheezing or shortness of breath. Uses seasonal  3  ? prochlorperazine (COMPAZINE) 10 MG tablet Take 1 tablet (10 mg total) by mouth every 6 (six) hours as needed for nausea or vomiting. 30 tablet 0  ?  REVLIMID 25 MG capsule TAKE 1 CAPSULE BY MOUTH ONCE DAILY FOR 14 DAYS ON AND 7 DAYS OFF ?[employer] Auth #: [id number]  Date obtained: 12/14/21 14 capsule 0  ? Sennosides (SENOKOT PO) Take by mouth. Bid    ? atorvas

## 2022-03-03 NOTE — Progress Notes (Signed)
Patient states she took her '40mg'$  decadron prior to arriving to infusion clinic.  ?

## 2022-03-03 NOTE — Patient Instructions (Signed)
[facility]  Discharge Instructions: Thank you for choosing [facility] to provide your oncology and hematology care.   If you have a lab appointment with the Cancer Center, please go directly to the Cancer Center and check in at the registration area.   Wear comfortable clothing and clothing appropriate for easy access to any Portacath or PICC line.   We strive to give you quality time with your provider. You may need to reschedule your appointment if you arrive late (15 or more minutes).  Arriving late affects you and other patients whose appointments are after yours.  Also, if you miss three or more appointments without notifying the office, you may be dismissed from the clinic at the provider's discretion.      For prescription refill requests, have your pharmacy contact our office and allow 72 hours for refills to be completed.    Today you received the following chemotherapy and/or immunotherapy agent: Carfilzomib (Kyprolis).    To help prevent nausea and vomiting after your treatment, we encourage you to take your nausea medication as directed.  BELOW ARE SYMPTOMS THAT SHOULD BE REPORTED IMMEDIATELY: *FEVER GREATER THAN 100.4 F (38 C) OR HIGHER *CHILLS OR SWEATING *NAUSEA AND VOMITING THAT IS NOT CONTROLLED WITH YOUR NAUSEA MEDICATION *UNUSUAL SHORTNESS OF BREATH *UNUSUAL BRUISING OR BLEEDING *URINARY PROBLEMS (pain or burning when urinating, or frequent urination) *BOWEL PROBLEMS (unusual diarrhea, constipation, pain near the anus) TENDERNESS IN MOUTH AND THROAT WITH OR WITHOUT PRESENCE OF ULCERS (sore throat, sores in mouth, or a toothache) UNUSUAL RASH, SWELLING OR PAIN  UNUSUAL VAGINAL DISCHARGE OR ITCHING   Items with * indicate a potential emergency and should be followed up as soon as possible or go to the Emergency Department if any problems should occur.  Please show the CHEMOTHERAPY ALERT CARD or IMMUNOTHERAPY ALERT CARD at  check-in to the Emergency Department and triage nurse.  Should you have questions after your visit or need to cancel or reschedule your appointment, please contact [facility]  Dept: [phone]  and follow the prompts.  Office hours are 8:00 a.m. to 4:30 p.m. Monday - Friday. Please note that voicemails left after 4:00 p.m. may not be returned until the following business day.  We are closed weekends and major holidays. You have access to a nurse at all times for urgent questions. Please call the main number to the clinic Dept: [phone] and follow the prompts.   For any non-urgent questions, you may also contact your provider using MyChart. We now offer e-Visits for anyone 18 and older to request care online for non-urgent symptoms. For details visit [email or web address].   Also download the MyChart app! Go to the app store, search "MyChart", open the app, select [facility], and log in with your MyChart username and password.  Due to Covid, a mask is required upon entering the hospital/clinic. If you do not have a mask, one will be given to you upon arrival. For doctor visits, patients may have 1 support person aged 18 or older with them. For treatment visits, patients cannot have anyone with them due to current Covid guidelines and our immunocompromised population.   

## 2022-03-03 NOTE — Progress Notes (Signed)
Nutrition Follow-up: ? ?Patient with multiple myeloma with plasmacytoma. She is currently Kyprolis (started 2/17).  ? ?Met with patient in waiting area prior to infusion. She reports "feeling great and getting stronger." Patient reports taste alterations are improving. She is eating a variety of foods with goods sources of protein. Patient recalls eating either chicken, steak, or prime rib every night. Patient is drinking Premier 1-2 times weekly. Patient reports her blood sugars have improved, recent A1c of 5. Her PCP has taken her off metformin. She is thinking about joining the gym. Patient reports ongoing lower extremity weakness. She would like to begin walking on the treadmill to build muscle.  ? ?Medications: reviewed  ? ?Labs: glucose 172 ? ?Anthropometrics: Weight 169 lb 14.4 oz slightly decreased  ? ?3/31 - 171 lb 14.4 oz  ?3/17 - 171 lb 11.2 oz  ?2/24 - 172 lb 12 oz  ? ?NUTRITION DIAGNOSIS: Inadequate oral intake improving  ? ? ?INTERVENTION:  ?Continue eating high calorie high protein foods for weight maintenance ?Encouraged activity as able  ?  ? ?MONITORING, EVALUATION, GOAL: weight trends, intake  ? ? ?NEXT VISIT: Friday May 12 during infusion ? ? ? ?

## 2022-03-09 ENCOUNTER — Encounter: Payer: Self-pay | Admitting: Hematology and Oncology

## 2022-03-09 DIAGNOSIS — Z95828 Presence of other vascular implants and grafts: Secondary | ICD-10-CM | POA: Insufficient documentation

## 2022-03-10 ENCOUNTER — Other Ambulatory Visit: Payer: BC Managed Care – PPO

## 2022-03-10 ENCOUNTER — Inpatient Hospital Stay: Payer: BC Managed Care – PPO

## 2022-03-10 ENCOUNTER — Other Ambulatory Visit: Payer: Self-pay

## 2022-03-10 VITALS — BP 113/57 | HR 85 | Temp 97.9°F | Resp 16 | Wt 168.5 lb

## 2022-03-10 DIAGNOSIS — C9000 Multiple myeloma not having achieved remission: Secondary | ICD-10-CM

## 2022-03-10 DIAGNOSIS — C9030 Solitary plasmacytoma not having achieved remission: Secondary | ICD-10-CM | POA: Diagnosis not present

## 2022-03-10 DIAGNOSIS — Z95828 Presence of other vascular implants and grafts: Secondary | ICD-10-CM

## 2022-03-10 LAB — CMP (CANCER CENTER ONLY)
ALT: 18 U/L (ref 0–44)
AST: 19 U/L (ref 15–41)
Albumin: 4.2 g/dL (ref 3.5–5.0)
Alkaline Phosphatase: 70 U/L (ref 38–126)
Anion gap: 10 (ref 5–15)
BUN: 18 mg/dL (ref 8–23)
CO2: 24 mmol/L (ref 22–32)
Calcium: 9.2 mg/dL (ref 8.9–10.3)
Chloride: 107 mmol/L (ref 98–111)
Creatinine: 0.87 mg/dL (ref 0.44–1.00)
GFR, Estimated: >60 mL/min (ref >60)
Glucose, Bld: 189 mg/dL — ABNORMAL HIGH (ref 70–99)
Potassium: 4.2 mmol/L (ref 3.5–5.1)
Sodium: 141 mmol/L (ref 135–145)
Total Bilirubin: 1.2 mg/dL (ref 0.3–1.2)
Total Protein: 6.2 g/dL — ABNORMAL LOW (ref 6.5–8.1)

## 2022-03-10 LAB — CBC WITH DIFFERENTIAL (CANCER CENTER ONLY)
Abs Immature Granulocytes: 0.02 10*3/uL (ref 0.00–0.07)
Basophils Absolute: 0 10*3/uL (ref 0.0–0.1)
Basophils Relative: 0 %
Eosinophils Absolute: 0 10*3/uL (ref 0.0–0.5)
Eosinophils Relative: 0 %
HCT: 33.1 % — ABNORMAL LOW (ref 36.0–46.0)
Hemoglobin: 11.1 g/dL — ABNORMAL LOW (ref 12.0–15.0)
Immature Granulocytes: 1 %
Lymphocytes Relative: 15 %
Lymphs Abs: 0.4 10*3/uL — ABNORMAL LOW (ref 0.7–4.0)
MCH: 33.4 pg (ref 26.0–34.0)
MCHC: 33.5 g/dL (ref 30.0–36.0)
MCV: 99.7 fL (ref 80.0–100.0)
Monocytes Absolute: 0.1 10*3/uL (ref 0.1–1.0)
Monocytes Relative: 3 %
Neutro Abs: 1.9 10*3/uL (ref 1.7–7.7)
Neutrophils Relative %: 81 %
Platelet Count: 101 10*3/uL — ABNORMAL LOW (ref 150–400)
RBC: 3.32 MIL/uL — ABNORMAL LOW (ref 3.87–5.11)
RDW: 16.4 % — ABNORMAL HIGH (ref 11.5–15.5)
WBC Count: 2.3 10*3/uL — ABNORMAL LOW (ref 4.0–10.5)
nRBC: 0 % (ref 0.0–0.2)

## 2022-03-10 LAB — LACTATE DEHYDROGENASE: LDH: 229 U/L — ABNORMAL HIGH (ref 98–192)

## 2022-03-10 MED ORDER — HEPARIN SOD (PORK) LOCK FLUSH 100 UNIT/ML IV SOLN
500.0000 [IU] | Freq: Once | INTRAVENOUS | Status: AC | PRN
  Administered 2022-03-10: 500 [IU]

## 2022-03-10 MED ORDER — SODIUM CHLORIDE 0.9% FLUSH
10.0000 mL | Freq: Once | INTRAVENOUS | Status: AC
  Administered 2022-03-10: 10 mL

## 2022-03-10 MED ORDER — DEXTROSE 5 % IV SOLN
70.0000 mg/m2 | Freq: Once | INTRAVENOUS | Status: AC
  Administered 2022-03-10: 130 mg via INTRAVENOUS
  Filled 2022-03-10: qty 60

## 2022-03-10 MED ORDER — SODIUM CHLORIDE 0.9 % IV SOLN: Freq: Once | INTRAVENOUS | Status: AC

## 2022-03-10 MED ORDER — SODIUM CHLORIDE 0.9% FLUSH
10.0000 mL | INTRAVENOUS | Status: DC | PRN
  Administered 2022-03-10: 10 mL

## 2022-03-10 NOTE — Progress Notes (Signed)
Pt informed this RN that she took 40 mg of PO decadron at home prior to inf appt today ?

## 2022-03-10 NOTE — Patient Instructions (Signed)
[facility]  Discharge Instructions: Thank you for choosing [facility] to provide your oncology and hematology care.   If you have a lab appointment with the Cancer Center, please go directly to the Cancer Center and check in at the registration area.   Wear comfortable clothing and clothing appropriate for easy access to any Portacath or PICC line.   We strive to give you quality time with your provider. You may need to reschedule your appointment if you arrive late (15 or more minutes).  Arriving late affects you and other patients whose appointments are after yours.  Also, if you miss three or more appointments without notifying the office, you may be dismissed from the clinic at the provider's discretion.      For prescription refill requests, have your pharmacy contact our office and allow 72 hours for refills to be completed.    Today you received the following chemotherapy and/or immunotherapy agents: Kyprolis    To help prevent nausea and vomiting after your treatment, we encourage you to take your nausea medication as directed.  BELOW ARE SYMPTOMS THAT SHOULD BE REPORTED IMMEDIATELY: . *FEVER GREATER THAN 100.4 F (38 C) OR HIGHER . *CHILLS OR SWEATING . *NAUSEA AND VOMITING THAT IS NOT CONTROLLED WITH YOUR NAUSEA MEDICATION . *UNUSUAL SHORTNESS OF BREATH . *UNUSUAL BRUISING OR BLEEDING . *URINARY PROBLEMS (pain or burning when urinating, or frequent urination) . *BOWEL PROBLEMS (unusual diarrhea, constipation, pain near the anus) . TENDERNESS IN MOUTH AND THROAT WITH OR WITHOUT PRESENCE OF ULCERS (sore throat, sores in mouth, or a toothache) . UNUSUAL RASH, SWELLING OR PAIN  . UNUSUAL VAGINAL DISCHARGE OR ITCHING   Items with * indicate a potential emergency and should be followed up as soon as possible or go to the Emergency Department if any problems should occur.  Please show the CHEMOTHERAPY ALERT CARD or IMMUNOTHERAPY ALERT  CARD at check-in to the Emergency Department and triage nurse.  Should you have questions after your visit or need to cancel or reschedule your appointment, please contact [facility]  Dept: [phone]  and follow the prompts.  Office hours are 8:00 a.m. to 4:30 p.m. Monday - Friday. Please note that voicemails left after 4:00 p.m. may not be returned until the following business day.  We are closed weekends and major holidays. You have access to a nurse at all times for urgent questions. Please call the main number to the clinic Dept: [phone] and follow the prompts.   For any non-urgent questions, you may also contact your provider using MyChart. We now offer e-Visits for anyone 18 and older to request care online for non-urgent symptoms. For details visit [email or web address].   Also download the MyChart app! Go to the app store, search "MyChart", open the app, select , and log in with your MyChart username and password.  Due to Covid, a mask is required upon entering the hospital/clinic. If you do not have a mask, one will be given to you upon arrival. For doctor visits, patients may have 1 support person aged 18 or older with them. For treatment visits, patients cannot have anyone with them due to current Covid guidelines and our immunocompromised population.   

## 2022-03-13 LAB — KAPPA/LAMBDA LIGHT CHAINS
Kappa free light chain: 4.4 mg/L (ref 3.3–19.4)
Kappa, lambda light chain ratio: 2.32 — ABNORMAL HIGH (ref 0.26–1.65)
Lambda free light chains: 1.9 mg/L — ABNORMAL LOW (ref 5.7–26.3)

## 2022-03-13 LAB — MULTIPLE MYELOMA PANEL, SERUM
Albumin SerPl Elph-Mcnc: 3.8 g/dL (ref 2.9–4.4)
Albumin/Glob SerPl: 1.8 — ABNORMAL HIGH (ref 0.7–1.7)
Alpha 1: 0.2 g/dL (ref 0.0–0.4)
Alpha2 Glob SerPl Elph-Mcnc: 0.8 g/dL (ref 0.4–1.0)
B-Globulin SerPl Elph-Mcnc: 0.9 g/dL (ref 0.7–1.3)
Gamma Glob SerPl Elph-Mcnc: 0.2 g/dL — ABNORMAL LOW (ref 0.4–1.8)
Globulin, Total: 2.2 g/dL (ref 2.2–3.9)
IgA: 9 mg/dL — ABNORMAL LOW (ref 87–352)
IgG (Immunoglobin G), Serum: 210 mg/dL — ABNORMAL LOW (ref 586–1602)
IgM (Immunoglobulin M), Srm: 6 mg/dL — ABNORMAL LOW (ref 26–217)
Total Protein ELP: 6 g/dL (ref 6.0–8.5)

## 2022-03-17 ENCOUNTER — Encounter: Payer: Self-pay | Admitting: Hematology and Oncology

## 2022-03-17 ENCOUNTER — Inpatient Hospital Stay: Payer: BC Managed Care – PPO

## 2022-03-17 ENCOUNTER — Other Ambulatory Visit: Payer: Self-pay

## 2022-03-17 ENCOUNTER — Other Ambulatory Visit: Payer: BC Managed Care – PPO

## 2022-03-17 ENCOUNTER — Inpatient Hospital Stay (HOSPITAL_BASED_OUTPATIENT_CLINIC_OR_DEPARTMENT_OTHER): Payer: BC Managed Care – PPO | Admitting: Hematology and Oncology

## 2022-03-17 VITALS — BP 131/63 | HR 87 | Temp 97.9°F | Resp 15 | Wt 168.0 lb

## 2022-03-17 DIAGNOSIS — Z95828 Presence of other vascular implants and grafts: Secondary | ICD-10-CM

## 2022-03-17 DIAGNOSIS — C9000 Multiple myeloma not having achieved remission: Secondary | ICD-10-CM

## 2022-03-17 DIAGNOSIS — C9030 Solitary plasmacytoma not having achieved remission: Secondary | ICD-10-CM | POA: Diagnosis not present

## 2022-03-17 LAB — CBC WITH DIFFERENTIAL (CANCER CENTER ONLY)
Abs Immature Granulocytes: 0.01 10*3/uL (ref 0.00–0.07)
Basophils Absolute: 0 10*3/uL (ref 0.0–0.1)
Basophils Relative: 0 %
Eosinophils Absolute: 0 10*3/uL (ref 0.0–0.5)
Eosinophils Relative: 0 %
HCT: 32.3 % — ABNORMAL LOW (ref 36.0–46.0)
Hemoglobin: 10.8 g/dL — ABNORMAL LOW (ref 12.0–15.0)
Immature Granulocytes: 0 %
Lymphocytes Relative: 12 %
Lymphs Abs: 0.4 10*3/uL — ABNORMAL LOW (ref 0.7–4.0)
MCH: 33.4 pg (ref 26.0–34.0)
MCHC: 33.4 g/dL (ref 30.0–36.0)
MCV: 100 fL (ref 80.0–100.0)
Monocytes Absolute: 0.1 10*3/uL (ref 0.1–1.0)
Monocytes Relative: 3 %
Neutro Abs: 2.6 10*3/uL (ref 1.7–7.7)
Neutrophils Relative %: 85 %
Platelet Count: 101 10*3/uL — ABNORMAL LOW (ref 150–400)
RBC: 3.23 MIL/uL — ABNORMAL LOW (ref 3.87–5.11)
RDW: 15.4 % (ref 11.5–15.5)
WBC Count: 3.1 10*3/uL — ABNORMAL LOW (ref 4.0–10.5)
nRBC: 0 % (ref 0.0–0.2)

## 2022-03-17 LAB — CMP (CANCER CENTER ONLY)
ALT: 15 U/L (ref 0–44)
AST: 16 U/L (ref 15–41)
Albumin: 4.2 g/dL (ref 3.5–5.0)
Alkaline Phosphatase: 62 U/L (ref 38–126)
Anion gap: 7 (ref 5–15)
BUN: 24 mg/dL — ABNORMAL HIGH (ref 8–23)
CO2: 24 mmol/L (ref 22–32)
Calcium: 9.2 mg/dL (ref 8.9–10.3)
Chloride: 109 mmol/L (ref 98–111)
Creatinine: 0.87 mg/dL (ref 0.44–1.00)
GFR, Estimated: >60 mL/min (ref >60)
Glucose, Bld: 147 mg/dL — ABNORMAL HIGH (ref 70–99)
Potassium: 4.2 mmol/L (ref 3.5–5.1)
Sodium: 140 mmol/L (ref 135–145)
Total Bilirubin: 0.9 mg/dL (ref 0.3–1.2)
Total Protein: 6.2 g/dL — ABNORMAL LOW (ref 6.5–8.1)

## 2022-03-17 LAB — LACTATE DEHYDROGENASE: LDH: 245 U/L — ABNORMAL HIGH (ref 98–192)

## 2022-03-17 MED ORDER — SODIUM CHLORIDE 0.9 % IV SOLN: 40.0000 mg | Freq: Once | INTRAVENOUS | Status: DC | PRN

## 2022-03-17 MED ORDER — SODIUM CHLORIDE 0.9% FLUSH
10.0000 mL | Freq: Once | INTRAVENOUS | Status: AC
  Administered 2022-03-17: 10 mL

## 2022-03-17 MED ORDER — HEPARIN SOD (PORK) LOCK FLUSH 100 UNIT/ML IV SOLN
500.0000 [IU] | Freq: Once | INTRAVENOUS | Status: AC
  Administered 2022-03-17: 500 [IU] via INTRAVENOUS

## 2022-03-17 MED ORDER — SODIUM CHLORIDE 0.9 % IV SOLN: Freq: Once | INTRAVENOUS | Status: DC

## 2022-03-17 MED ORDER — SODIUM CHLORIDE 0.9% FLUSH
10.0000 mL | Freq: Once | INTRAVENOUS | Status: AC
  Administered 2022-03-17: 10 mL via INTRAVENOUS

## 2022-03-17 MED ORDER — SODIUM CHLORIDE 0.9 % IV SOLN: Freq: Once | INTRAVENOUS | Status: AC

## 2022-03-17 MED ORDER — DEXTROSE 5 % IV SOLN
70.0000 mg/m2 | Freq: Once | INTRAVENOUS | Status: AC
  Administered 2022-03-17: 130 mg via INTRAVENOUS
  Filled 2022-03-17: qty 60

## 2022-03-17 NOTE — Patient Instructions (Signed)
[facility]  Discharge Instructions: Thank you for choosing [facility] to provide your oncology and hematology care.   If you have a lab appointment with the Cancer Center, please go directly to the Cancer Center and check in at the registration area.   Wear comfortable clothing and clothing appropriate for easy access to any Portacath or PICC line.   We strive to give you quality time with your provider. You may need to reschedule your appointment if you arrive late (15 or more minutes).  Arriving late affects you and other patients whose appointments are after yours.  Also, if you miss three or more appointments without notifying the office, you may be dismissed from the clinic at the provider's discretion.      For prescription refill requests, have your pharmacy contact our office and allow 72 hours for refills to be completed.    Today you received the following chemotherapy and/or immunotherapy agents: Kyprolis    To help prevent nausea and vomiting after your treatment, we encourage you to take your nausea medication as directed.  BELOW ARE SYMPTOMS THAT SHOULD BE REPORTED IMMEDIATELY: . *FEVER GREATER THAN 100.4 F (38 C) OR HIGHER . *CHILLS OR SWEATING . *NAUSEA AND VOMITING THAT IS NOT CONTROLLED WITH YOUR NAUSEA MEDICATION . *UNUSUAL SHORTNESS OF BREATH . *UNUSUAL BRUISING OR BLEEDING . *URINARY PROBLEMS (pain or burning when urinating, or frequent urination) . *BOWEL PROBLEMS (unusual diarrhea, constipation, pain near the anus) . TENDERNESS IN MOUTH AND THROAT WITH OR WITHOUT PRESENCE OF ULCERS (sore throat, sores in mouth, or a toothache) . UNUSUAL RASH, SWELLING OR PAIN  . UNUSUAL VAGINAL DISCHARGE OR ITCHING   Items with * indicate a potential emergency and should be followed up as soon as possible or go to the Emergency Department if any problems should occur.  Please show the CHEMOTHERAPY ALERT CARD or IMMUNOTHERAPY ALERT  CARD at check-in to the Emergency Department and triage nurse.  Should you have questions after your visit or need to cancel or reschedule your appointment, please contact [facility]  Dept: [phone]  and follow the prompts.  Office hours are 8:00 a.m. to 4:30 p.m. Monday - Friday. Please note that voicemails left after 4:00 p.m. may not be returned until the following business day.  We are closed weekends and major holidays. You have access to a nurse at all times for urgent questions. Please call the main number to the clinic Dept: [phone] and follow the prompts.   For any non-urgent questions, you may also contact your provider using MyChart. We now offer e-Visits for anyone 18 and older to request care online for non-urgent symptoms. For details visit [email or web address].   Also download the MyChart app! Go to the app store, search "MyChart", open the app, select [facility], and log in with your MyChart username and password.  Due to Covid, a mask is required upon entering the hospital/clinic. If you do not have a mask, one will be given to you upon arrival. For doctor visits, patients may have 1 support person aged 18 or older with them. For treatment visits, patients cannot have anyone with them due to current Covid guidelines and our immunocompromised population.   

## 2022-03-17 NOTE — Progress Notes (Signed)
?[place] ?[phone]   [phone] ? ?PROGRESS NOTE ? ?Patient Care Team: ?[name] as PCP - General (Physician Assistant) ? ?Hematological/Oncological History ?# Plasmacytoma with Multiple Myeloma  ?07/29/2021: CT cervical spine shows expansile lytic lesion which almost ?completely replaces the normal C2 vertebral body  ?08/24/2021: PET CT scan shows increased metabolic activity at C2 and C7  ?08/25/2021: biopsy of C2 lesion showed finding consistent with plasma cell neoplasm.  ?09/08/2021: start radiation therapy ?09/12/2021: establish care with Dr. Lorenso Courier  ?09/28/2021: end radiation therapy ?10/10/2021: Bone marrow biopsy. Results confirm plasma cell neoplasm consistent with multiple myeloma, kappa restricted.  ?10/26/2021: Cycle 1 Day 1 of VRD ?11/18/2021: Cycle 2 Day 1 of VRD ?11/25/2021: Velcade HELD due to full body rash. Resolved with steroid therapy.  ?12/16/2021: Cycle 1 Day 1 of Dara/Rev/Dex ?12/30/2021: HELD Daratumumab due to toxicity. Patient unable to tolerate. Requested holding the medication  ?01/06/2022: Cycle 1 Day 1 of Kd  ?02/03/2022: Cycle 2 Day 1 of Kd ?03/03/2022: Cycle 3 Day 1 of Kd ? ?Interval History:  ?Carol [name] 66 y.o. female with medical history significant for multiple myeloma with plasmacytoma who presents for a follow up visit. The patient's last visit was on 03/03/2022. Today is Cycle 3 Day 15 of Kd.  ? ?On exam today Carol [name] reports that she is "feeling good".  She notes that she is having some mild headaches and that they last for about 10 to 15 minutes.  She notes that her energy levels have been relatively good.  She notes that she has recently been enjoying pickles is eating about 1 drink per week.  She is trying her best to eat better and eating beef or chicken daily.  Her appetite has increased.  She notes that her bowel movements have been regular but she has been using milk of magnesia and stool softener to help move these  along. She denies fevers, chills, night sweats, shortness of breath, chest pain or cough. She has no other complaints. Rest of the 10 point ROS is below. ? ?MEDICAL HISTORY:  ?Past Medical History:  ?Diagnosis Date  ? Allergy   ? Ankle edema   ? both ankles  ? Arthritis   ? hands,knees,elbows  ? 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 Diabetes mellitus   ? type 2   ? Eczema   ? History of kidney stones   ? Hypothyroidism   ? Obesity   ? Sleep apnea   ? cpap machine setting of 3  ? Thyroid disease   ? ? ?SURGICAL HISTORY: ?Past Surgical History:  ?Procedure Laterality Date  ? BREAST BIOPSY Right 2001  ? CHOLECYSTECTOMY  2009  ? open  ? COLONOSCOPY    ? CYSTOSCOPY W/ RETROGRADES Left 11/22/2018  ? Procedure: CYSTOSCOPY WITH RETROGRADE PYELOGRAM;  Surgeon: Ardis Hughs, MD;  Location: [facility];  Service: Urology;  Laterality: Left;  ? CYSTOSCOPY WITH RETROGRADE PYELOGRAM, URETEROSCOPY AND STENT PLACEMENT Right 10/03/2021  ? Procedure: CYSTOSCOPY WITH RETROGRADE PYELOGRAM, URETEROSCOPY,STONE EXTRACTION AND STENT PLACEMENT;  Surgeon: Ceasar Mons, MD;  Location: [facility];  Service: Urology;  Laterality: Right;  ? DILATATION & CURRETTAGE/HYSTEROSCOPY WITH RESECTOCOPE N/A 01/21/2013  ? Procedure: DILATATION & CURETTAGE/HYSTEROSCOPY WITH RESECTOCOPE AND RESECTION OF ENDOMETRIAL;  Surgeon: Selinda Orion, MD;  [facility];  Service: Gynecology;  Laterality: N/A;  ? IR IMAGING GUIDED PORT INSERTION  02/01/2022  ? KNEE ARTHROSCOPY    ? left   ?  POLYPECTOMY    ? POSTERIOR CERVICAL FUSION/FORAMINOTOMY N/A 08/25/2021  ? Procedure: Occiput to Cervical 5 Posterior cervical instrumented fusion with open biopsy of Cervical 2 Mass. Cervical Two Ganglionectomy;  Surgeon: Judith Part, MD;  Location: [facility];  Service: Neurosurgery;  Laterality: N/A;  ? ROBOT ASSISTED PYELOPLASTY Left 11/22/2018  ? Procedure: XI ROBOTIC ASSISTED ATTEMPTED PYELOPLASTY CONVERTED TO NEPHRECTOMY/LYSIS OF  ADHESIONS/UMBILICAL HERNIA REPAIR;  Surgeon: Ardis Hughs, MD;  Location: [facility];  Service: Urology;  Laterality: Left;  ? ROBOTIC ASSITED PARTIAL NEPHRECTOMY Left 06/11/2015  ? Procedure: LEFT ROBOTIC ASSISTED LAPAROSCOPIC  PARTIAL NEPHRECTOMY;  Surgeon: Ardis Hughs, MD;  Location: [facility];  Service: Urology;  Laterality: Left;  ? ROTATOR CUFF REPAIR Left 2011  ? URETERAL BIOPSY Left 02/14/2017  ? Procedure: URETERAL BIOPSY;  Surgeon: Ardis Hughs, MD;  Location: [facility];  Service: Urology;  Laterality: Left;  ? URETEROSCOPY WITH HOLMIUM LASER LITHOTRIPSY Left 02/14/2017  ? Procedure: URETEROSCOPY LITHOTRIPSY, URETERAL BALLOON DILATION;  Surgeon: Ardis Hughs, MD;  Location: [facility];  Service: Urology;  Laterality: Left;  With STENT  ? ? ?SOCIAL HISTORY: ?Social History  ? ?Socioeconomic History  ? Marital status: Married  ?  Spouse name: Not on file  ? Number of children: Not on file  ? Years of education: Not on file  ? Highest education level: Not on file  ?Occupational History  ? Not on file  ?Tobacco Use  ? Smoking status: Never  ? Smokeless tobacco: Never  ?Vaping Use  ? Vaping Use: Never used  ?Substance and Sexual Activity  ? Alcohol use: Yes  ?  Comment: occ  ? Drug use: No  ? Sexual activity: Not on file  ?Other Topics Concern  ? Not on file  ?Social History Narrative  ? Not on file  ? ?Social Determinants of Health  ? ?Financial Resource Strain: Not on file  ?Food Insecurity: Not on file  ?Transportation Needs: Not on file  ?Physical Activity: Not on file  ?Stress: Not on file  ?Social Connections: Not on file  ?Intimate Partner Violence: Not on file  ? ? ?FAMILY HISTORY: ?Family History  ?Problem Relation Age of Onset  ? Cancer Father   ? ? ?ALLERGIES:  is allergic to morphine. ? ?MEDICATIONS:  ?Current Outpatient Medications  ?Medication Sig Dispense Refill  ? acyclovir (ZOVIRAX) 400 MG tablet TAKE 1 TABLET BY MOUTH TWICE A DAY 180 tablet 1  ? allopurinol (ZYLOPRIM) 300 MG tablet  Take 1 tablet (300 mg total) by mouth daily. 90 tablet 1  ? Ascorbic Acid (VITAMIN C ADULT GUMMIES PO) Take 1 tablet by mouth daily.    ? aspirin EC 81 MG tablet Take 1 tablet (81 mg total) by mouth daily. Swallow whole. 90 tablet 3  ? atorvastatin (LIPITOR) 20 MG tablet Take 20 mg by mouth at bedtime.    ? dexamethasone (DECADRON) 4 MG tablet Take by mouth.    ? furosemide (LASIX) 20 MG tablet Take 20 mg by mouth daily as needed for fluid or edema.    ? KLOR-CON M20 20 MEQ tablet TAKE 1 TABLET BY MOUTH EVERY DAY 30 tablet 1  ? levothyroxine (SYNTHROID) 50 MCG tablet Take 50 mcg by mouth daily before breakfast.    ? lidocaine-prilocaine (EMLA) cream Apply 1 application. topically as needed. 30 g 0  ? Magnesium Hydroxide (MILK OF MAGNESIA PO) Take by mouth. 2 times a week    ? OZEMPIC, 0.25 OR 0.5 MG/DOSE, 2 MG/1.5ML  SOPN Inject 0.5 mg into the skin every Saturday.    ? Sennosides (SENOKOT PO) Take by mouth. Bid    ? Vitamin D, Ergocalciferol, (DRISDOL) 1.25 MG (50000 UNIT) CAPS capsule Take 50,000 Units by mouth once a week.    ? acetaminophen (TYLENOL) 500 MG tablet Take 1,000 mg by mouth every 6 (six) hours as needed for mild pain. (Patient not taking: Reported on 03/17/2022)    ? HEMADY 20 MG TABS TAKE 40 MG BY MOUTH ONCE A WEEK. 2 TABLETS ON DAY OF CHEMO TREATMENT. 60 tablet 3  ? nystatin (MYCOSTATIN) 100000 UNIT/ML suspension Take 5 mLs (500,000 Units total) by mouth 4 (four) times daily. (Patient not taking: Reported on 03/17/2022) 60 mL 0  ? ondansetron (ZOFRAN ODT) 4 MG disintegrating tablet Take 1 tablet (4 mg total) by mouth every 8 (eight) hours as needed for nausea or vomiting. (Patient not taking: Reported on 03/17/2022) 30 tablet 0  ? ondansetron (ZOFRAN) 8 MG tablet Take 1 tablet (8 mg total) by mouth every 8 (eight) hours as needed. (Patient not taking: Reported on 03/17/2022) 30 tablet 0  ? PROAIR HFA 108 (90 BASE) MCG/ACT inhaler Inhale 2 puffs into the lungs every 4 (four) hours as needed for wheezing  or shortness of breath. Uses seasonal (Patient not taking: Reported on 03/17/2022)  3  ? prochlorperazine (COMPAZINE) 10 MG tablet Take 1 tablet (10 mg total) by mouth every 6 (six) hours as needed for nausea or vomiting. (P

## 2022-03-21 ENCOUNTER — Ambulatory Visit
Admission: RE | Admit: 2022-03-21 | Discharge: 2022-03-21 | Disposition: A | Discharge status: Home / Self Care | Payer: BC Managed Care – PPO | Source: Ambulatory Visit | Attending: Neurological Surgery | Admitting: Neurological Surgery

## 2022-03-21 DIAGNOSIS — S12101A Unspecified nondisplaced fracture of second cervical vertebra, initial encounter for closed fracture: Secondary | ICD-10-CM

## 2022-03-27 ENCOUNTER — Other Ambulatory Visit: Payer: Self-pay | Admitting: Physician Assistant

## 2022-03-28 ENCOUNTER — Encounter: Payer: Self-pay | Admitting: Hematology and Oncology

## 2022-03-29 ENCOUNTER — Encounter: Payer: Self-pay | Admitting: Hematology and Oncology

## 2022-03-31 ENCOUNTER — Inpatient Hospital Stay: Payer: BC Managed Care – PPO | Attending: Radiation Oncology | Admitting: Hematology and Oncology

## 2022-03-31 ENCOUNTER — Inpatient Hospital Stay: Payer: BC Managed Care – PPO

## 2022-03-31 ENCOUNTER — Other Ambulatory Visit: Payer: Self-pay

## 2022-03-31 ENCOUNTER — Other Ambulatory Visit: Payer: BC Managed Care – PPO

## 2022-03-31 ENCOUNTER — Inpatient Hospital Stay: Payer: BC Managed Care – PPO | Admitting: Dietician

## 2022-03-31 VITALS — BP 100/61 | HR 93 | Resp 18 | Wt 164.0 lb

## 2022-03-31 DIAGNOSIS — Z79899 Other long term (current) drug therapy: Secondary | ICD-10-CM | POA: Diagnosis not present

## 2022-03-31 DIAGNOSIS — C9000 Multiple myeloma not having achieved remission: Secondary | ICD-10-CM

## 2022-03-31 DIAGNOSIS — C9030 Solitary plasmacytoma not having achieved remission: Secondary | ICD-10-CM | POA: Insufficient documentation

## 2022-03-31 DIAGNOSIS — Z95828 Presence of other vascular implants and grafts: Secondary | ICD-10-CM

## 2022-03-31 DIAGNOSIS — Z5112 Encounter for antineoplastic immunotherapy: Secondary | ICD-10-CM | POA: Insufficient documentation

## 2022-03-31 LAB — CBC WITH DIFFERENTIAL (CANCER CENTER ONLY)
Abs Immature Granulocytes: 0.02 10*3/uL (ref 0.00–0.07)
Basophils Absolute: 0 10*3/uL (ref 0.0–0.1)
Basophils Relative: 0 %
Eosinophils Absolute: 0 10*3/uL (ref 0.0–0.5)
Eosinophils Relative: 0 %
HCT: 33 % — ABNORMAL LOW (ref 36.0–46.0)
Hemoglobin: 11.7 g/dL — ABNORMAL LOW (ref 12.0–15.0)
Immature Granulocytes: 0 %
Lymphocytes Relative: 7 %
Lymphs Abs: 0.3 10*3/uL — ABNORMAL LOW (ref 0.7–4.0)
MCH: 35.2 pg — ABNORMAL HIGH (ref 26.0–34.0)
MCHC: 35.5 g/dL (ref 30.0–36.0)
MCV: 99.4 fL (ref 80.0–100.0)
Monocytes Absolute: 0.1 10*3/uL (ref 0.1–1.0)
Monocytes Relative: 2 %
Neutro Abs: 4.2 10*3/uL (ref 1.7–7.7)
Neutrophils Relative %: 91 %
Platelet Count: 174 10*3/uL (ref 150–400)
RBC: 3.32 MIL/uL — ABNORMAL LOW (ref 3.87–5.11)
RDW: 13.9 % (ref 11.5–15.5)
WBC Count: 4.6 10*3/uL (ref 4.0–10.5)
nRBC: 0 % (ref 0.0–0.2)

## 2022-03-31 LAB — CMP (CANCER CENTER ONLY)
ALT: 17 U/L (ref 0–44)
AST: 19 U/L (ref 15–41)
Albumin: 4.5 g/dL (ref 3.5–5.0)
Alkaline Phosphatase: 63 U/L (ref 38–126)
Anion gap: 11 (ref 5–15)
BUN: 14 mg/dL (ref 8–23)
CO2: 24 mmol/L (ref 22–32)
Calcium: 9.2 mg/dL (ref 8.9–10.3)
Chloride: 105 mmol/L (ref 98–111)
Creatinine: 0.83 mg/dL (ref 0.44–1.00)
GFR, Estimated: >60 mL/min (ref >60)
Glucose, Bld: 145 mg/dL — ABNORMAL HIGH (ref 70–99)
Potassium: 3.7 mmol/L (ref 3.5–5.1)
Sodium: 140 mmol/L (ref 135–145)
Total Bilirubin: 1.1 mg/dL (ref 0.3–1.2)
Total Protein: 6.5 g/dL (ref 6.5–8.1)

## 2022-03-31 LAB — LACTATE DEHYDROGENASE: LDH: 275 U/L — ABNORMAL HIGH (ref 98–192)

## 2022-03-31 MED ORDER — SODIUM CHLORIDE 0.9 % IV SOLN: Freq: Once | INTRAVENOUS | Status: DC

## 2022-03-31 MED ORDER — SODIUM CHLORIDE 0.9% FLUSH
10.0000 mL | Freq: Once | INTRAVENOUS | Status: AC
  Administered 2022-03-31: 10 mL

## 2022-03-31 MED ORDER — SODIUM CHLORIDE 0.9% FLUSH: 3.0000 mL | INTRAVENOUS | Status: DC | PRN

## 2022-03-31 MED ORDER — SODIUM CHLORIDE 0.9% FLUSH
10.0000 mL | INTRAVENOUS | Status: DC | PRN
  Administered 2022-03-31: 10 mL

## 2022-03-31 MED ORDER — DEXTROSE 5 % IV SOLN
70.0000 mg/m2 | Freq: Once | INTRAVENOUS | Status: AC
  Administered 2022-03-31: 130 mg via INTRAVENOUS
  Filled 2022-03-31: qty 5

## 2022-03-31 MED ORDER — SODIUM CHLORIDE 0.9 % IV SOLN: Freq: Once | INTRAVENOUS | Status: AC

## 2022-03-31 MED ORDER — HEPARIN SOD (PORK) LOCK FLUSH 100 UNIT/ML IV SOLN
500.0000 [IU] | Freq: Once | INTRAVENOUS | Status: AC | PRN
  Administered 2022-03-31: 500 [IU]

## 2022-03-31 NOTE — Patient Instructions (Signed)
[facility]  Discharge Instructions: Thank you for choosing [facility] to provide your oncology and hematology care.   If you have a lab appointment with the Cancer Center, please go directly to the Cancer Center and check in at the registration area.   Wear comfortable clothing and clothing appropriate for easy access to any Portacath or PICC line.   We strive to give you quality time with your provider. You may need to reschedule your appointment if you arrive late (15 or more minutes).  Arriving late affects you and other patients whose appointments are after yours.  Also, if you miss three or more appointments without notifying the office, you may be dismissed from the clinic at the provider's discretion.      For prescription refill requests, have your pharmacy contact our office and allow 72 hours for refills to be completed.    Today you received the following chemotherapy and/or immunotherapy agents: Kyprolis    To help prevent nausea and vomiting after your treatment, we encourage you to take your nausea medication as directed.  BELOW ARE SYMPTOMS THAT SHOULD BE REPORTED IMMEDIATELY: . *FEVER GREATER THAN 100.4 F (38 C) OR HIGHER . *CHILLS OR SWEATING . *NAUSEA AND VOMITING THAT IS NOT CONTROLLED WITH YOUR NAUSEA MEDICATION . *UNUSUAL SHORTNESS OF BREATH . *UNUSUAL BRUISING OR BLEEDING . *URINARY PROBLEMS (pain or burning when urinating, or frequent urination) . *BOWEL PROBLEMS (unusual diarrhea, constipation, pain near the anus) . TENDERNESS IN MOUTH AND THROAT WITH OR WITHOUT PRESENCE OF ULCERS (sore throat, sores in mouth, or a toothache) . UNUSUAL RASH, SWELLING OR PAIN  . UNUSUAL VAGINAL DISCHARGE OR ITCHING   Items with * indicate a potential emergency and should be followed up as soon as possible or go to the Emergency Department if any problems should occur.  Please show the CHEMOTHERAPY ALERT CARD or IMMUNOTHERAPY ALERT  CARD at check-in to the Emergency Department and triage nurse.  Should you have questions after your visit or need to cancel or reschedule your appointment, please contact [facility]  Dept: [phone]  and follow the prompts.  Office hours are 8:00 a.m. to 4:30 p.m. Monday - Friday. Please note that voicemails left after 4:00 p.m. may not be returned until the following business day.  We are closed weekends and major holidays. You have access to a nurse at all times for urgent questions. Please call the main number to the clinic Dept: [phone] and follow the prompts.   For any non-urgent questions, you may also contact your provider using MyChart. We now offer e-Visits for anyone 18 and older to request care online for non-urgent symptoms. For details visit [email or web address].   Also download the MyChart app! Go to the app store, search "MyChart", open the app, select [facility], and log in with your MyChart username and password.  Due to Covid, a mask is required upon entering the hospital/clinic. If you do not have a mask, one will be given to you upon arrival. For doctor visits, patients may have 1 support person aged 18 or older with them. For treatment visits, patients cannot have anyone with them due to current Covid guidelines and our immunocompromised population.   

## 2022-03-31 NOTE — Progress Notes (Signed)
Nutrition Follow-up: ? ?Patient with multiple myeloma with plasmacytoma. She is currently Kyprolis (started 2/17).  ? ? ?Met with patient in infusion. She reports "feeling great" Patient eating small meals throughout the day. She recalls up to 6 ounces of meat. Patient is no longer drinking premier protein as these do not taste good and appetite has improved. Patient has joined the gym. She is going 4 days/week. Patient walking on the treadmill. She is working with a Clinical research associate for [employer] once weekly. Patient is drinking 60-64 ounces of water. She denies nausea vomiting. Patient takes milk of magnesia and stool softener as needed for constipation.  ? ? ?Medications: reviewed ? ?Labs: glucose 145 ? ?Anthropometrics: weight 164 lb today decreased 2.4% in 2 weeks; significant  ? ?4/28 - 168 lb ?4/14 - 169 lb 14.4 oz  ?3/17 - 171 lb 11.2 oz  ? ?NUTRITION DIAGNOSIS: Inadequate oral intake continues ? ? ?INTERVENTION:  ?Continue strategies for increasing calories and protein with small frequent meals and snacks ?Encouraged added snack after leaving the gym. She is agreeable to trying CIB ?Continue activity as able  ?  ? ?MONITORING, EVALUATION, GOAL: weight trends, intake ? ? ?NEXT VISIT: Friday June 16 during infusion ? ? ? ?

## 2022-03-31 NOTE — Progress Notes (Signed)
Pt informed RN that she took at home PO dexamethasone prior to her inf appt today ?

## 2022-03-31 NOTE — Progress Notes (Signed)
Patient has lost 4 lbs in two weeks. She has recently started exercising to increase stamina.

## 2022-04-07 ENCOUNTER — Inpatient Hospital Stay: Payer: BC Managed Care – PPO

## 2022-04-07 ENCOUNTER — Other Ambulatory Visit: Payer: Self-pay

## 2022-04-07 ENCOUNTER — Other Ambulatory Visit: Payer: BC Managed Care – PPO

## 2022-04-07 VITALS — BP 113/66 | HR 94 | Temp 98.3°F | Wt 162.0 lb

## 2022-04-07 DIAGNOSIS — C9030 Solitary plasmacytoma not having achieved remission: Secondary | ICD-10-CM | POA: Diagnosis not present

## 2022-04-07 DIAGNOSIS — C9000 Multiple myeloma not having achieved remission: Secondary | ICD-10-CM

## 2022-04-07 DIAGNOSIS — Z95828 Presence of other vascular implants and grafts: Secondary | ICD-10-CM

## 2022-04-07 LAB — CBC WITH DIFFERENTIAL (CANCER CENTER ONLY)
Abs Immature Granulocytes: 0.01 10*3/uL (ref 0.00–0.07)
Basophils Absolute: 0 10*3/uL (ref 0.0–0.1)
Basophils Relative: 0 %
Eosinophils Absolute: 0 10*3/uL (ref 0.0–0.5)
Eosinophils Relative: 0 %
HCT: 32.4 % — ABNORMAL LOW (ref 36.0–46.0)
Hemoglobin: 11.7 g/dL — ABNORMAL LOW (ref 12.0–15.0)
Immature Granulocytes: 0 %
Lymphocytes Relative: 13 %
Lymphs Abs: 0.3 10*3/uL — ABNORMAL LOW (ref 0.7–4.0)
MCH: 35.7 pg — ABNORMAL HIGH (ref 26.0–34.0)
MCHC: 36.1 g/dL — ABNORMAL HIGH (ref 30.0–36.0)
MCV: 98.8 fL (ref 80.0–100.0)
Monocytes Absolute: 0.1 10*3/uL (ref 0.1–1.0)
Monocytes Relative: 3 %
Neutro Abs: 2.1 10*3/uL (ref 1.7–7.7)
Neutrophils Relative %: 84 %
Platelet Count: 94 10*3/uL — ABNORMAL LOW (ref 150–400)
RBC: 3.28 MIL/uL — ABNORMAL LOW (ref 3.87–5.11)
RDW: 13.3 % (ref 11.5–15.5)
WBC Count: 2.5 10*3/uL — ABNORMAL LOW (ref 4.0–10.5)
nRBC: 0 % (ref 0.0–0.2)

## 2022-04-07 LAB — CMP (CANCER CENTER ONLY)
ALT: 16 U/L (ref 0–44)
AST: 16 U/L (ref 15–41)
Albumin: 4.3 g/dL (ref 3.5–5.0)
Alkaline Phosphatase: 56 U/L (ref 38–126)
Anion gap: 9 (ref 5–15)
BUN: 21 mg/dL (ref 8–23)
CO2: 25 mmol/L (ref 22–32)
Calcium: 9.6 mg/dL (ref 8.9–10.3)
Chloride: 108 mmol/L (ref 98–111)
Creatinine: 0.85 mg/dL (ref 0.44–1.00)
GFR, Estimated: >60 mL/min (ref >60)
Glucose, Bld: 181 mg/dL — ABNORMAL HIGH (ref 70–99)
Potassium: 4 mmol/L (ref 3.5–5.1)
Sodium: 142 mmol/L (ref 135–145)
Total Bilirubin: 1.1 mg/dL (ref 0.3–1.2)
Total Protein: 6.3 g/dL — ABNORMAL LOW (ref 6.5–8.1)

## 2022-04-07 LAB — LACTATE DEHYDROGENASE: LDH: 251 U/L — ABNORMAL HIGH (ref 98–192)

## 2022-04-07 MED ORDER — SODIUM CHLORIDE 0.9 % IV SOLN: Freq: Once | INTRAVENOUS | Status: AC

## 2022-04-07 MED ORDER — SODIUM CHLORIDE 0.9 % IV SOLN: Freq: Once | INTRAVENOUS | Status: DC

## 2022-04-07 MED ORDER — HEPARIN SOD (PORK) LOCK FLUSH 100 UNIT/ML IV SOLN: 500.0000 [IU] | Freq: Once | INTRAVENOUS | Status: DC | PRN

## 2022-04-07 MED ORDER — SODIUM CHLORIDE 0.9% FLUSH
10.0000 mL | Freq: Once | INTRAVENOUS | Status: AC
  Administered 2022-04-07: 10 mL

## 2022-04-07 MED ORDER — SODIUM CHLORIDE 0.9% FLUSH: 10.0000 mL | Freq: Once | INTRAVENOUS | Status: DC

## 2022-04-07 MED ORDER — HEPARIN SOD (PORK) LOCK FLUSH 100 UNIT/ML IV SOLN: 250.0000 [IU] | Freq: Once | INTRAVENOUS | Status: DC | PRN

## 2022-04-07 MED ORDER — DEXTROSE 5 % IV SOLN
70.0000 mg/m2 | Freq: Once | INTRAVENOUS | Status: AC
  Administered 2022-04-07: 130 mg via INTRAVENOUS
  Filled 2022-04-07: qty 60

## 2022-04-07 MED ORDER — ALTEPLASE 2 MG IJ SOLR: 2.0000 mg | Freq: Once | INTRAMUSCULAR | Status: DC | PRN

## 2022-04-07 MED ORDER — SODIUM CHLORIDE 0.9% FLUSH: 3.0000 mL | Freq: Once | INTRAVENOUS | Status: DC | PRN

## 2022-04-07 MED ORDER — SODIUM CHLORIDE 0.9% FLUSH: 10.0000 mL | Freq: Once | INTRAVENOUS | Status: DC | PRN

## 2022-04-07 NOTE — Progress Notes (Signed)
Per Dr. Lorenso Courier, okay to treat today with platelets of 94 and okay to proceed without CMP results.

## 2022-04-07 NOTE — Progress Notes (Signed)
Pt stated she took her decadron at home prior to treatment today.

## 2022-04-09 ENCOUNTER — Encounter: Payer: Self-pay | Admitting: Hematology and Oncology

## 2022-04-09 NOTE — Progress Notes (Signed)
[facility] [phone]   [phone]  PROGRESS NOTE  Patient Care Team: [name] as PCP - General (Physician Assistant)  Hematological/Oncological History # Plasmacytoma with Multiple Myeloma  07/29/2021: CT cervical spine shows expansile lytic lesion which almost completely replaces the normal C2 vertebral body  08/24/2021: PET CT scan shows increased metabolic activity at C2 and C7  08/25/2021: biopsy of C2 lesion showed finding consistent with plasma cell neoplasm.  09/08/2021: start radiation therapy 09/12/2021: establish care with Dr. Lorenso Courier  09/28/2021: end radiation therapy 10/10/2021: Bone marrow biopsy. Results confirm plasma cell neoplasm consistent with multiple myeloma, kappa restricted.  10/26/2021: Cycle 1 Day 1 of VRD 11/18/2021: Cycle 2 Day 1 of VRD 11/25/2021: Velcade HELD due to full body rash. Resolved with steroid therapy.  12/16/2021: Cycle 1 Day 1 of Dara/Rev/Dex 12/30/2021: HELD Daratumumab due to toxicity. Patient unable to tolerate. Requested holding the medication  01/06/2022: Cycle 1 Day 1 of Kd  02/03/2022: Cycle 2 Day 1 of Kd 03/03/2022: Cycle 3 Day 1 of Kd 03/31/2022: Cycle 4 Day 1 of Kd  Interval History:  Carol [name] 66 y.o. female with medical history significant for multiple myeloma with plasmacytoma who presents for a follow up visit. The patient's last visit was on 03/03/2022. Today is Cycle 4 Day 1 of Kd.   On exam today Carol [name] reports she continues to have a metallic taste in her mouth ever since completing radiation therapy.  She reports that certain foods do not taste good including white fish.  She notes her energy is quite low today at a 2 out of 10.  She reports that she takes a 1 hour nap at work on her lunch hour every day.  She reports that she tries to take about a 30-minute walk every day to keep her strength up.  She notes that she is not having any issues with numbness or tingling of her fingers  or toes.  Her biggest concern is her total lack of stamina.  She denies fevers, chills, night sweats, shortness of breath, chest pain or cough. She has no other complaints. Rest of the 10 point ROS is below.  MEDICAL HISTORY:  Past Medical History:  Diagnosis Date   Allergy    Ankle edema    both ankles   Arthritis    hands,knees,elbows   Cancer ([place]) 2016   tumor kidney   Diabetes mellitus    type 2    Eczema    History of kidney stones    Hypothyroidism    Obesity    Sleep apnea    cpap machine setting of 3   Thyroid disease     SURGICAL HISTORY: Past Surgical History:  Procedure Laterality Date   BREAST BIOPSY Right 2001   CHOLECYSTECTOMY  2009   open   COLONOSCOPY     CYSTOSCOPY W/ RETROGRADES Left 11/22/2018   Procedure: CYSTOSCOPY WITH RETROGRADE PYELOGRAM;  Surgeon: Ardis Hughs, MD;  Location: [facility];  Service: Urology;  Laterality: Left;   CYSTOSCOPY WITH RETROGRADE PYELOGRAM, URETEROSCOPY AND STENT PLACEMENT Right 10/03/2021   Procedure: CYSTOSCOPY WITH RETROGRADE PYELOGRAM, URETEROSCOPY,STONE EXTRACTION AND STENT PLACEMENT;  Surgeon: Ceasar Mons, MD;  Location: [facility];  Service: Urology;  Laterality: Right;   DILATATION & CURRETTAGE/HYSTEROSCOPY WITH RESECTOCOPE N/A 01/21/2013   Procedure: DILATATION & CURETTAGE/HYSTEROSCOPY WITH RESECTOCOPE AND RESECTION OF ENDOMETRIAL;  Surgeon: Selinda Orion, MD;  Location: [facility];  Service: Gynecology;  Laterality: N/A;  IR IMAGING GUIDED PORT INSERTION  02/01/2022   KNEE ARTHROSCOPY     left    POLYPECTOMY     POSTERIOR CERVICAL FUSION/FORAMINOTOMY N/A 08/25/2021   Procedure: Occiput to Cervical 5 Posterior cervical instrumented fusion with open biopsy of Cervical 2 Mass. Cervical Two Ganglionectomy;  Surgeon: Judith Part, MD;  Location: [facility];  Service: Neurosurgery;  Laterality: N/A;   ROBOT ASSISTED PYELOPLASTY Left 11/22/2018   Procedure: XI ROBOTIC ASSISTED ATTEMPTED  PYELOPLASTY CONVERTED TO NEPHRECTOMY/LYSIS OF ADHESIONS/UMBILICAL HERNIA REPAIR;  Surgeon: Ardis Hughs, MD;  Location: [facility];  Service: Urology;  Laterality: Left;   ROBOTIC ASSITED PARTIAL NEPHRECTOMY Left 06/11/2015   Procedure: LEFT ROBOTIC ASSISTED LAPAROSCOPIC  PARTIAL NEPHRECTOMY;  Surgeon: Ardis Hughs, MD;  Location: [facility];  Service: Urology;  Laterality: Left;   ROTATOR CUFF REPAIR Left 2011   URETERAL BIOPSY Left 02/14/2017   Procedure: URETERAL BIOPSY;  Surgeon: Ardis Hughs, MD;  Location: [facility];  Service: Urology;  Laterality: Left;   URETEROSCOPY WITH HOLMIUM LASER LITHOTRIPSY Left 02/14/2017   Procedure: URETEROSCOPY LITHOTRIPSY, URETERAL BALLOON DILATION;  Surgeon: Ardis Hughs, MD;  Location: [facility];  Service: Urology;  Laterality: Left;  With STENT    SOCIAL HISTORY: Social History   Socioeconomic History   Marital status: Married    Spouse name: Not on file   Number of children: Not on file   Years of education: Not on file   Highest education level: Not on file  Occupational History   Not on file  Tobacco Use   Smoking status: Never   Smokeless tobacco: Never  Vaping Use   Vaping Use: Never used  Substance and Sexual Activity   Alcohol use: Yes    Comment: occ   Drug use: No   Sexual activity: Not on file  Other Topics Concern   Not on file  Social History Narrative   Not on file   Social Determinants of Health   Financial Resource Strain: Not on file  Food Insecurity: Not on file  Transportation Needs: Not on file  Physical Activity: Not on file  Stress: Not on file  Social Connections: Not on file  Intimate Partner Violence: Not on file    FAMILY HISTORY: Family History  Problem Relation Age of Onset   Cancer Father     ALLERGIES:  is allergic to morphine.  MEDICATIONS:  Current Outpatient Medications  Medication Sig Dispense Refill   acetaminophen (TYLENOL) 500 MG tablet Take 1,000 mg by mouth every 6 (six)  hours as needed for mild pain. (Patient not taking: Reported on 03/17/2022)     acyclovir (ZOVIRAX) 400 MG tablet TAKE 1 TABLET BY MOUTH TWICE A DAY 180 tablet 1   allopurinol (ZYLOPRIM) 300 MG tablet Take 1 tablet (300 mg total) by mouth daily. 90 tablet 1   Ascorbic Acid (VITAMIN C ADULT GUMMIES PO) Take 1 tablet by mouth daily.     aspirin EC 81 MG tablet Take 1 tablet (81 mg total) by mouth daily. Swallow whole. 90 tablet 3   atorvastatin (LIPITOR) 20 MG tablet Take 20 mg by mouth at bedtime.     dexamethasone (DECADRON) 4 MG tablet Take by mouth.     furosemide (LASIX) 20 MG tablet Take 20 mg by mouth daily as needed for fluid or edema.     HEMADY 20 MG TABS TAKE 40 MG BY MOUTH ONCE A WEEK. 2 TABLETS ON DAY OF CHEMO TREATMENT. 60 tablet 3  KLOR-CON M20 20 MEQ tablet TAKE 1 TABLET BY MOUTH EVERY DAY 30 tablet 1   levothyroxine (SYNTHROID) 50 MCG tablet Take 50 mcg by mouth daily before breakfast.     lidocaine-prilocaine (EMLA) cream Apply 1 application. topically as needed. 30 g 0   Magnesium Hydroxide (MILK OF MAGNESIA PO) Take by mouth. 2 times a week     nystatin (MYCOSTATIN) 100000 UNIT/ML suspension Take 5 mLs (500,000 Units total) by mouth 4 (four) times daily. (Patient not taking: Reported on 03/17/2022) 60 mL 0   ondansetron (ZOFRAN ODT) 4 MG disintegrating tablet Take 1 tablet (4 mg total) by mouth every 8 (eight) hours as needed for nausea or vomiting. (Patient not taking: Reported on 03/17/2022) 30 tablet 0   ondansetron (ZOFRAN) 8 MG tablet Take 1 tablet (8 mg total) by mouth every 8 (eight) hours as needed. (Patient not taking: Reported on 03/17/2022) 30 tablet 0   OZEMPIC, 0.25 OR 0.5 MG/DOSE, 2 MG/1.5ML SOPN Inject 0.5 mg into the skin every Saturday.     PROAIR HFA 108 (90 BASE) MCG/ACT inhaler Inhale 2 puffs into the lungs every 4 (four) hours as needed for wheezing or shortness of breath. Uses seasonal (Patient not taking: Reported on 03/17/2022)  3   prochlorperazine (COMPAZINE)  10 MG tablet Take 1 tablet (10 mg total) by mouth every 6 (six) hours as needed for nausea or vomiting. (Patient not taking: Reported on 03/17/2022) 30 tablet 0   Sennosides (SENOKOT PO) Take by mouth. Bid     Vitamin D, Ergocalciferol, (DRISDOL) 1.25 MG (50000 UNIT) CAPS capsule Take 50,000 Units by mouth once a week.     No current facility-administered medications for this visit.    REVIEW OF SYSTEMS:   Constitutional: ( - ) fevers, ( - )  chills , ( - ) night sweats Eyes: ( - ) blurriness of vision, ( - ) double vision, ( - ) watery eyes Ears, nose, mouth, throat, and face: ( - ) mucositis, ( - ) sore throat Respiratory: ( - ) cough, ( - ) dyspnea, ( - ) wheezes Cardiovascular: ( - ) palpitation, ( - ) chest discomfort, ( - ) lower extremity swelling Gastrointestinal:  ( - ) nausea, ( - ) heartburn, ( -) change in bowel habits Skin: ( - ) abnormal skin rashes Lymphatics: ( - ) new lymphadenopathy, ( - ) easy bruising Neurological: ( - ) numbness, ( - ) tingling, ( - ) new weaknesses Behavioral/Psych: ( - ) mood change, ( - ) new changes  All other systems were reviewed with the patient and are negative.  PHYSICAL EXAMINATION: ECOG PERFORMANCE STATUS: 1 - Symptomatic but completely ambulatory  Vitals:   03/31/22 1151  BP: 100/61  Pulse: 93  Resp: 18  SpO2: 100%     Filed Weights   03/31/22 1151  Weight: 164 lb (74.4 kg)    GENERAL: Well-appearing middle-age Caucasian female alert, no distress and comfortable SKIN: No evidence of rash, erythema, dry skin, or raised lesions. EYES: conjunctiva are pink and non-injected, sclera clear LUNGS: clear to auscultation and percussion with normal breathing effort HEART: regular rate & rhythm and no murmurs and no lower extremity edema Musculoskeletal: no cyanosis of digits and no clubbing  PSYCH: alert & oriented x 3, fluent speech NEURO: no focal motor/sensory deficits  LABORATORY DATA:  I have reviewed the data as listed     Latest Ref Rng & Units 04/07/2022    2:17 PM 03/31/2022   11:22  AM 03/17/2022   11:48 AM  CBC  WBC 4.0 - 10.5 K/uL 2.5   4.6   3.1    Hemoglobin 12.0 - 15.0 g/dL 11.7   11.7   10.8    Hematocrit 36.0 - 46.0 % 32.4   33.0   32.3    Platelets 150 - 400 K/uL 94   174   101         Latest Ref Rng & Units 04/07/2022    2:17 PM 03/31/2022   11:22 AM 03/17/2022   11:48 AM  CMP  Glucose 70 - 99 mg/dL 181   145   147    BUN 8 - 23 mg/dL '21   14   24    ' Creatinine 0.44 - 1.00 mg/dL 0.85   0.83   0.87    Sodium 135 - 145 mmol/L 142   140   140    Potassium 3.5 - 5.1 mmol/L 4.0   3.7   4.2    Chloride 98 - 111 mmol/L 108   105   109    CO2 22 - 32 mmol/L '25   24   24    ' Calcium 8.9 - 10.3 mg/dL 9.6   9.2   9.2    Total Protein 6.5 - 8.1 g/dL 6.3   6.5   6.2    Total Bilirubin 0.3 - 1.2 mg/dL 1.1   1.1   0.9    Alkaline Phos 38 - 126 U/L 56   63   62    AST 15 - 41 U/L '16   19   16    ' ALT 0 - 44 U/L '16   17   15      ' Lab Results  Component Value Date   MPROTEIN Not Observed 03/10/2022   MPROTEIN Not Observed 02/10/2022   MPROTEIN Not Observed 01/13/2022   Lab Results  Component Value Date   KPAFRELGTCHN 4.4 03/10/2022   KPAFRELGTCHN 3.6 02/10/2022   KPAFRELGTCHN 11.4 01/13/2022   LAMBDASER 1.9 (L) 03/10/2022   LAMBDASER 2.1 (L) 02/10/2022   LAMBDASER 3.9 (L) 01/13/2022   KAPLAMBRATIO 2.32 (H) 03/10/2022   KAPLAMBRATIO 1.71 (H) 02/10/2022   KAPLAMBRATIO 2.92 (H) 01/13/2022    RADIOGRAPHIC STUDIES: CT CERVICAL SPINE WO CONTRAST  Result Date: 03/21/2022 CLINICAL DATA:  Multiple myeloma cervical spine.  Prior surgery EXAM: CT CERVICAL SPINE WITHOUT CONTRAST TECHNIQUE: Multidetector CT imaging of the cervical spine was performed without intravenous contrast. Multiplanar CT image reconstructions were also generated. RADIATION DOSE REDUCTION: This exam was performed according to the departmental dose-optimization program which includes automated exposure control, adjustment of the mA  and/or kV according to patient size and/or use of iterative reconstruction technique. COMPARISON:  CT cervical spine 07/29/2021 FINDINGS: Alignment: Normal Skull base and vertebrae: Extensive destruction of the C2 vertebral body extending into the dens and lamina and pedicle bilaterally. Previously noted loss of height and fracture of the posterior wall has improved in the interval. More subtle lytic lesions are present at C3, C4, C5 unchanged. Lytic lesion also at T2 unchanged. Surgical fixation of the occiput to the cervical spine posteriorly. Posterior rods and screws extend from the occiput put to C5 bilaterally. Hardware appears to be in satisfactory position. Posterior bone graft also present at the surgical levels. Soft tissues and spinal canal: No significant spinal stenosis. No mass or adenopathy in the neck. Disc levels:  C2-3: Negative for spinal stenosis C3-4: Negative for stenosis. C4-5: Mild disc space narrowing.  Negative for stenosis C5-6: Mild disc degeneration and diffuse uncinate spurring. Mild left foraminal narrowing C6-7: Disc degeneration and diffuse uncinate spurring. No significant stenosis C7-T1: Negative for stenosis Upper chest: 9.5 mm nodule left upper lobe. This is stable from prior chest CT and PET 2022. This was felt to be benign on prior PET. Other: None IMPRESSION: 1. Extensive lytic destruction of C2 compatible with myeloma. Buckling of the posterior wall of C2 with fracture has improved in the interval. No new fracture 2. Lytic lesions throughout the remainder of the cervical spine and T2 are stable. No new fracture elsewhere. 3. Cervical spondylosis.  This is stable from prior studies. 4. 9.5 mm nodule left upper lobe, stable from prior studies and felt to be benign. Electronically Signed   By: Franchot Gallo M.D.   On: 03/21/2022 16:24    ASSESSMENT & PLAN Carol [name] 66 y.o. female with medical history significant for multiple myeloma with plasmacytoma who presents for a  follow up visit.   # Free Kappa Multiple Myeloma # Plasmacytoma  -- original finding of L2 plasmacytoma on biopsy of back lesion, bone marrow biopsy confirms greater than 60% plasma cells consistent with multiple myeloma. --Cycle 1 Day 1 of VRD treatment started on 10/26/2021.  --VRD therapy stopped on 11/25/2021 due to rash. Started Dara/Rev/Dex on 12/16/2021.  --will order monthly SPEP, UPEP, SFLC and beta 2 microglobulin --weekly CBC, CMP, and LDH --Cycle 1 Day 1 of Kd started on 01/06/2022 Plan:  --Labs today were reviewed and adequate for treatment today.  --Cycle 4 Day 1 of Kd therapy today.  --RTC in 2 weeks with interval weekly treatment.   #Leukopenia/Thrombocytopenia/Anemia --WBC 2.5, levels oscillate. Hgb 11.7, stable. Platelet count 94 --Likely secondary to chemotherapy --continue to monitor.   #Rash--resolved --Present for approximately one week --Received IV solumedrol and medrol dose pak.  --Suspect drug induced (velcade)  #Thrush, resolved: --Treated with Nystatin mouthwash with improvement.   #Metallic taste/weight loss-improving:  --Under the care of dietician --Encouraged to increase caloric intake and supplement with protein shakes.  #Supportive Care -- chemotherapy education complete  -- port placement complete. -- sent prescription for EMLA cream -- zofran 26m q8H PRN and compazine 142mPO q6H for nausea -- acyclovir 40037mO BID for VCZ prophylaxis -- allopurinol 300m71m daily for TLS prophylaxis -- Start zometa on 02/03/2022. Will continue q 3 months. Next dose in June 2023.  -- no pain medication required at this time.   No orders of the defined types were placed in this encounter.  All questions were answered. The patient knows to call the clinic with any problems, questions or concerns.  I have spent a total of 30 minutes minutes of face-to-face and non-face-to-face time, preparing to see the patient, performing a medically appropriate examination,  counseling and educating the patient, ordering medications/tests, documenting clinical information in the electronic health record, and care coordination.   JohnLedell Peoples Department of Hematology/Oncology [facility]: [phone]: [phone]: [email or web address]  04/09/2022 5:05 PM

## 2022-04-10 LAB — KAPPA/LAMBDA LIGHT CHAINS
Kappa free light chain: 4.5 mg/L (ref 3.3–19.4)
Kappa, lambda light chain ratio: 2.05 — ABNORMAL HIGH (ref 0.26–1.65)
Lambda free light chains: 2.2 mg/L — ABNORMAL LOW (ref 5.7–26.3)

## 2022-04-11 ENCOUNTER — Other Ambulatory Visit: Payer: Self-pay | Admitting: Neurological Surgery

## 2022-04-11 LAB — MULTIPLE MYELOMA PANEL, SERUM
Albumin SerPl Elph-Mcnc: 3.9 g/dL (ref 2.9–4.4)
Albumin/Glob SerPl: 2.1 — ABNORMAL HIGH (ref 0.7–1.7)
Alpha 1: 0.2 g/dL (ref 0.0–0.4)
Alpha2 Glob SerPl Elph-Mcnc: 0.7 g/dL (ref 0.4–1.0)
B-Globulin SerPl Elph-Mcnc: 0.8 g/dL (ref 0.7–1.3)
Gamma Glob SerPl Elph-Mcnc: 0.2 g/dL — ABNORMAL LOW (ref 0.4–1.8)
Globulin, Total: 1.9 g/dL — ABNORMAL LOW (ref 2.2–3.9)
IgA: 8 mg/dL — ABNORMAL LOW (ref 87–352)
IgG (Immunoglobin G), Serum: 180 mg/dL — ABNORMAL LOW (ref 586–1602)
IgM (Immunoglobulin M), Srm: <5 mg/dL — ABNORMAL LOW (ref 26–217)
Total Protein ELP: 5.8 g/dL — ABNORMAL LOW (ref 6.0–8.5)

## 2022-04-13 ENCOUNTER — Telehealth: Payer: Self-pay

## 2022-04-13 NOTE — Telephone Encounter (Signed)
Called pt and left voicemail of date/times of appointments for 5/26. Gave callback # [phone] for any questions or concerns.

## 2022-04-14 ENCOUNTER — Other Ambulatory Visit: Payer: BC Managed Care – PPO

## 2022-04-14 ENCOUNTER — Other Ambulatory Visit: Payer: Self-pay

## 2022-04-14 ENCOUNTER — Inpatient Hospital Stay: Payer: BC Managed Care – PPO

## 2022-04-14 ENCOUNTER — Other Ambulatory Visit: Payer: Self-pay | Admitting: Hematology and Oncology

## 2022-04-14 ENCOUNTER — Inpatient Hospital Stay (HOSPITAL_BASED_OUTPATIENT_CLINIC_OR_DEPARTMENT_OTHER): Payer: BC Managed Care – PPO | Admitting: Physician Assistant

## 2022-04-14 VITALS — BP 114/69 | HR 73 | Temp 97.6°F | Resp 16 | Ht 62.0 in | Wt 162.9 lb

## 2022-04-14 DIAGNOSIS — C9000 Multiple myeloma not having achieved remission: Secondary | ICD-10-CM | POA: Diagnosis not present

## 2022-04-14 DIAGNOSIS — C9030 Solitary plasmacytoma not having achieved remission: Secondary | ICD-10-CM | POA: Diagnosis not present

## 2022-04-14 DIAGNOSIS — Z95828 Presence of other vascular implants and grafts: Secondary | ICD-10-CM

## 2022-04-14 LAB — CMP (CANCER CENTER ONLY)
ALT: 13 U/L (ref 0–44)
AST: 15 U/L (ref 15–41)
Albumin: 4.1 g/dL (ref 3.5–5.0)
Alkaline Phosphatase: 55 U/L (ref 38–126)
Anion gap: 7 (ref 5–15)
BUN: 22 mg/dL (ref 8–23)
CO2: 25 mmol/L (ref 22–32)
Calcium: 9.5 mg/dL (ref 8.9–10.3)
Chloride: 109 mmol/L (ref 98–111)
Creatinine: 0.96 mg/dL (ref 0.44–1.00)
GFR, Estimated: >60 mL/min (ref >60)
Glucose, Bld: 145 mg/dL — ABNORMAL HIGH (ref 70–99)
Potassium: 4.3 mmol/L (ref 3.5–5.1)
Sodium: 141 mmol/L (ref 135–145)
Total Bilirubin: 1 mg/dL (ref 0.3–1.2)
Total Protein: 5.9 g/dL — ABNORMAL LOW (ref 6.5–8.1)

## 2022-04-14 LAB — CBC WITH DIFFERENTIAL (CANCER CENTER ONLY)
Abs Immature Granulocytes: 0.01 10*3/uL (ref 0.00–0.07)
Basophils Absolute: 0 10*3/uL (ref 0.0–0.1)
Basophils Relative: 0 %
Eosinophils Absolute: 0 10*3/uL (ref 0.0–0.5)
Eosinophils Relative: 0 %
HCT: 31 % — ABNORMAL LOW (ref 36.0–46.0)
Hemoglobin: 11.3 g/dL — ABNORMAL LOW (ref 12.0–15.0)
Immature Granulocytes: 0 %
Lymphocytes Relative: 13 %
Lymphs Abs: 0.4 10*3/uL — ABNORMAL LOW (ref 0.7–4.0)
MCH: 36.3 pg — ABNORMAL HIGH (ref 26.0–34.0)
MCHC: 36.5 g/dL — ABNORMAL HIGH (ref 30.0–36.0)
MCV: 99.7 fL (ref 80.0–100.0)
Monocytes Absolute: 0.1 10*3/uL (ref 0.1–1.0)
Monocytes Relative: 4 %
Neutro Abs: 2.6 10*3/uL (ref 1.7–7.7)
Neutrophils Relative %: 83 %
Platelet Count: 101 10*3/uL — ABNORMAL LOW (ref 150–400)
RBC: 3.11 MIL/uL — ABNORMAL LOW (ref 3.87–5.11)
RDW: 13.2 % (ref 11.5–15.5)
WBC Count: 3.1 10*3/uL — ABNORMAL LOW (ref 4.0–10.5)
nRBC: 0 % (ref 0.0–0.2)

## 2022-04-14 LAB — LACTATE DEHYDROGENASE: LDH: 219 U/L — ABNORMAL HIGH (ref 98–192)

## 2022-04-14 MED ORDER — SODIUM CHLORIDE 0.9% FLUSH
10.0000 mL | Freq: Once | INTRAVENOUS | Status: AC
  Administered 2022-04-14: 10 mL

## 2022-04-14 MED ORDER — HEPARIN SOD (PORK) LOCK FLUSH 100 UNIT/ML IV SOLN: 500.0000 [IU] | Freq: Once | INTRAVENOUS | Status: DC | PRN

## 2022-04-14 MED ORDER — SODIUM CHLORIDE 0.9 % IV SOLN
40.0000 mg | Freq: Once | INTRAVENOUS | Status: DC | PRN
  Filled 2022-04-14: qty 4

## 2022-04-14 MED ORDER — SODIUM CHLORIDE 0.9 % IV SOLN: Freq: Once | INTRAVENOUS | Status: AC

## 2022-04-14 MED ORDER — SODIUM CHLORIDE 0.9% FLUSH: 10.0000 mL | INTRAVENOUS | Status: DC | PRN

## 2022-04-14 MED ORDER — DEXTROSE 5 % IV SOLN
70.0000 mg/m2 | Freq: Once | INTRAVENOUS | Status: AC
  Administered 2022-04-14: 130 mg via INTRAVENOUS
  Filled 2022-04-14: qty 60

## 2022-04-14 NOTE — Progress Notes (Signed)
[place] [phone]   [phone]) [phone]  PROGRESS NOTE  Patient Care Team: [name] as PCP - General (Physician Assistant)  Hematological/Oncological History # Plasmacytoma with Multiple Myeloma  07/29/2021: CT cervical spine shows expansile lytic lesion which almost completely replaces the normal C2 vertebral body  08/24/2021: PET CT scan shows increased metabolic activity at C2 and C7  08/25/2021: biopsy of C2 lesion showed finding consistent with plasma cell neoplasm.  09/08/2021: start radiation therapy 09/12/2021: establish care with Dr. Lorenso Courier  09/28/2021: end radiation therapy 10/10/2021: Bone marrow biopsy. Results confirm plasma cell neoplasm consistent with multiple myeloma, kappa restricted.  10/26/2021: Cycle 1 Day 1 of VRD 11/18/2021: Cycle 2 Day 1 of VRD 11/25/2021: Velcade HELD due to full body rash. Resolved with steroid therapy.  12/16/2021: Cycle 1 Day 1 of Dara/Rev/Dex 12/30/2021: HELD Daratumumab due to toxicity. Patient unable to tolerate. Requested holding the medication  01/06/2022: Cycle 1 Day 1 of Kd  02/03/2022: Cycle 2 Day 1 of Kd 03/03/2022: Cycle 3 Day 1 of Kd 03/31/2022: Cycle 4 Day 1 of Kd  Interval History:  Carol [name] 66 y.o. female with medical history significant for multiple myeloma with plasmacytoma who presents for a follow up visit. The patient's last visit was on 03/31/2022. Today is Cycle 4 Day 15 of Kd.   On exam today Carol [name] reports her appetite is improved although she still has a metallic taste in her mouth.  She adds that she is looking forward to going back to the gym and becoming more active.  She denies any nausea or vomiting episodes.  Her bowel habits are unchanged.  She denies easy bruising or signs of active bleeding.  She denies any fevers, chills, night sweats, shortness of breath, chest pain, cough, peripheral edema or neuropathy. She has no other complaints. Rest of the 10 point ROS is  below.  MEDICAL HISTORY:  Past Medical History:  Diagnosis Date   Allergy    Ankle edema    both ankles   Arthritis    hands,knees,elbows   Cancer ([place]) 2016   tumor kidney   Diabetes mellitus    type 2    Eczema    History of kidney stones    Hypothyroidism    Obesity    Sleep apnea    cpap machine setting of 3   Thyroid disease     SURGICAL HISTORY: Past Surgical History:  Procedure Laterality Date   BREAST BIOPSY Right 2001   CHOLECYSTECTOMY  2009   open   COLONOSCOPY     CYSTOSCOPY W/ RETROGRADES Left 11/22/2018   Procedure: CYSTOSCOPY WITH RETROGRADE PYELOGRAM;  Surgeon: Ardis Hughs, MD;  Location: [facility];  Service: Urology;  Laterality: Left;   CYSTOSCOPY WITH RETROGRADE PYELOGRAM, URETEROSCOPY AND STENT PLACEMENT Right 10/03/2021   Procedure: CYSTOSCOPY WITH RETROGRADE PYELOGRAM, URETEROSCOPY,STONE EXTRACTION AND STENT PLACEMENT;  Surgeon: Ceasar Mons, MD;  Location: [facility];  Service: Urology;  Laterality: Right;   DILATATION & CURRETTAGE/HYSTEROSCOPY WITH RESECTOCOPE N/A 01/21/2013   Procedure: DILATATION & CURETTAGE/HYSTEROSCOPY WITH RESECTOCOPE AND RESECTION OF ENDOMETRIAL;  Surgeon: Selinda Orion, MD;  Location: [facility];  Service: Gynecology;  Laterality: N/A;   IR IMAGING GUIDED PORT INSERTION  02/01/2022   KNEE ARTHROSCOPY     left    POLYPECTOMY     POSTERIOR CERVICAL FUSION/FORAMINOTOMY N/A 08/25/2021   Procedure: Occiput to Cervical 5 Posterior cervical instrumented fusion with open biopsy of Cervical 2 Mass.  Cervical Two Ganglionectomy;  Surgeon: Judith Part, MD;  Location: [facility];  Service: Neurosurgery;  Laterality: N/A;   ROBOT ASSISTED PYELOPLASTY Left 11/22/2018   Procedure: XI ROBOTIC ASSISTED ATTEMPTED PYELOPLASTY CONVERTED TO NEPHRECTOMY/LYSIS OF ADHESIONS/UMBILICAL HERNIA REPAIR;  Surgeon: Ardis Hughs, MD;  Location: [facility];  Service: Urology;  Laterality: Left;   ROBOTIC ASSITED PARTIAL  NEPHRECTOMY Left 06/11/2015   Procedure: LEFT ROBOTIC ASSISTED LAPAROSCOPIC  PARTIAL NEPHRECTOMY;  Surgeon: Ardis Hughs, MD;  Location: [facility];  Service: Urology;  Laterality: Left;   ROTATOR CUFF REPAIR Left 2011   URETERAL BIOPSY Left 02/14/2017   Procedure: URETERAL BIOPSY;  Surgeon: Ardis Hughs, MD;  Location: [facility];  Service: Urology;  Laterality: Left;   URETEROSCOPY WITH HOLMIUM LASER LITHOTRIPSY Left 02/14/2017   Procedure: URETEROSCOPY LITHOTRIPSY, URETERAL BALLOON DILATION;  Surgeon: Ardis Hughs, MD;  Location: [facility];  Service: Urology;  Laterality: Left;  With STENT    SOCIAL HISTORY: Social History   Socioeconomic History   Marital status: Married    Spouse name: Not on file   Number of children: Not on file   Years of education: Not on file   Highest education level: Not on file  Occupational History   Not on file  Tobacco Use   Smoking status: Never   Smokeless tobacco: Never  Vaping Use   Vaping Use: Never used  Substance and Sexual Activity   Alcohol use: Yes    Comment: occ   Drug use: No   Sexual activity: Not on file  Other Topics Concern   Not on file  Social History Narrative   Not on file   Social Determinants of Health   Financial Resource Strain: Not on file  Food Insecurity: Not on file  Transportation Needs: Not on file  Physical Activity: Not on file  Stress: Not on file  Social Connections: Not on file  Intimate Partner Violence: Not on file    FAMILY HISTORY: Family History  Problem Relation Age of Onset   Cancer Father     ALLERGIES:  is allergic to morphine.  MEDICATIONS:  Current Outpatient Medications  Medication Sig Dispense Refill   acyclovir (ZOVIRAX) 400 MG tablet TAKE 1 TABLET BY MOUTH TWICE A DAY 180 tablet 1   allopurinol (ZYLOPRIM) 300 MG tablet Take 1 tablet (300 mg total) by mouth daily. 90 tablet 1   Ascorbic Acid (VITAMIN C ADULT GUMMIES PO) Take 1 tablet by mouth daily.     aspirin EC 81 MG  tablet Take 1 tablet (81 mg total) by mouth daily. Swallow whole. 90 tablet 3   atorvastatin (LIPITOR) 20 MG tablet Take 20 mg by mouth at bedtime.     dexamethasone (DECADRON) 4 MG tablet Take by mouth.     furosemide (LASIX) 20 MG tablet Take 20 mg by mouth daily as needed for fluid or edema.     HEMADY 20 MG TABS TAKE 40 MG BY MOUTH ONCE A WEEK. 2 TABLETS ON DAY OF CHEMO TREATMENT. 60 tablet 3   KLOR-CON M20 20 MEQ tablet TAKE 1 TABLET BY MOUTH EVERY DAY 30 tablet 1   levothyroxine (SYNTHROID) 50 MCG tablet Take 50 mcg by mouth daily before breakfast.     lidocaine-prilocaine (EMLA) cream Apply 1 application. topically as needed. 30 g 0   Magnesium Hydroxide (MILK OF MAGNESIA PO) Take by mouth. 2 times a week     ondansetron (ZOFRAN) 8 MG tablet Take 1 tablet (8 mg total)  by mouth every 8 (eight) hours as needed. (Patient not taking: Reported on 03/17/2022) 30 tablet 0   OZEMPIC, 0.25 OR 0.5 MG/DOSE, 2 MG/1.5ML SOPN Inject 0.5 mg into the skin every Saturday.     PROAIR HFA 108 (90 BASE) MCG/ACT inhaler Inhale 2 puffs into the lungs every 4 (four) hours as needed for wheezing or shortness of breath. Uses seasonal (Patient not taking: Reported on 03/17/2022)  3   prochlorperazine (COMPAZINE) 10 MG tablet Take 1 tablet (10 mg total) by mouth every 6 (six) hours as needed for nausea or vomiting. (Patient not taking: Reported on 03/17/2022) 30 tablet 0   Sennosides (SENOKOT PO) Take by mouth. Bid     Vitamin D, Ergocalciferol, (DRISDOL) 1.25 MG (50000 UNIT) CAPS capsule Take 50,000 Units by mouth once a week.     No current facility-administered medications for this visit.   Facility-Administered Medications Ordered in Other Visits  Medication Dose Route Frequency Provider Last Rate Last Admin   0.9 %  sodium chloride infusion   Intravenous Once Orson Slick, MD       carfilzomib (KYPROLIS) 130 mg in dextrose 5 % 100 mL chemo infusion  70 mg/m2 (Treatment Plan Recorded) Intravenous Once Orson Slick, MD 330 mL/hr at 04/14/22 1217 130 mg at 04/14/22 1217   dexamethasone (DECADRON) 40 mg in sodium chloride 0.9 % 50 mL IVPB  40 mg Intravenous Once PRN Ledell Peoples IV, MD       heparin lock flush 100 unit/mL  500 Units Intracatheter Once PRN Orson Slick, MD       sodium chloride flush (NS) 0.9 % injection 10 mL  10 mL Intracatheter PRN Orson Slick, MD        REVIEW OF SYSTEMS:   Constitutional: ( - ) fevers, ( - )  chills , ( - ) night sweats Eyes: ( - ) blurriness of vision, ( - ) double vision, ( - ) watery eyes Ears, nose, mouth, throat, and face: ( - ) mucositis, ( - ) sore throat Respiratory: ( - ) cough, ( - ) dyspnea, ( - ) wheezes Cardiovascular: ( - ) palpitation, ( - ) chest discomfort, ( - ) lower extremity swelling Gastrointestinal:  ( - ) nausea, ( - ) heartburn, ( -) change in bowel habits Skin: ( - ) abnormal skin rashes Lymphatics: ( - ) new lymphadenopathy, ( - ) easy bruising Neurological: ( - ) numbness, ( - ) tingling, ( - ) new weaknesses Behavioral/Psych: ( - ) mood change, ( - ) new changes  All other systems were reviewed with the patient and are negative.  PHYSICAL EXAMINATION: ECOG PERFORMANCE STATUS: 1 - Symptomatic but completely ambulatory  Vitals:   04/14/22 1040  BP: 114/69  Pulse: 73  Resp: 16  Temp: 97.6 F (36.4 C)  SpO2: 99%     Filed Weights   04/14/22 1040  Weight: 162 lb 14.4 oz (73.9 kg)    GENERAL: Well-appearing middle-age Caucasian female alert, no distress and comfortable SKIN: No evidence of rash, erythema, dry skin, or raised lesions. EYES: conjunctiva are pink and non-injected, sclera clear LUNGS: clear to auscultation and percussion with normal breathing effort HEART: regular rate & rhythm and no murmurs and no lower extremity edema Musculoskeletal: no cyanosis of digits and no clubbing  PSYCH: alert & oriented x 3, fluent speech NEURO: no focal motor/sensory deficits  LABORATORY DATA:  I have  reviewed the data  as listed    Latest Ref Rng & Units 04/14/2022    9:40 AM 04/07/2022    2:17 PM 03/31/2022   11:22 AM  CBC  WBC 4.0 - 10.5 K/uL 3.1   2.5   4.6    Hemoglobin 12.0 - 15.0 g/dL 11.3   11.7   11.7    Hematocrit 36.0 - 46.0 % 31.0   32.4   33.0    Platelets 150 - 400 K/uL 101   94   174         Latest Ref Rng & Units 04/14/2022    9:40 AM 04/07/2022    2:17 PM 03/31/2022   11:22 AM  CMP  Glucose 70 - 99 mg/dL 145   181   145    BUN 8 - 23 mg/dL '22   21   14    ' Creatinine 0.44 - 1.00 mg/dL 0.96   0.85   0.83    Sodium 135 - 145 mmol/L 141   142   140    Potassium 3.5 - 5.1 mmol/L 4.3   4.0   3.7    Chloride 98 - 111 mmol/L 109   108   105    CO2 22 - 32 mmol/L '25   25   24    ' Calcium 8.9 - 10.3 mg/dL 9.5   9.6   9.2    Total Protein 6.5 - 8.1 g/dL 5.9   6.3   6.5    Total Bilirubin 0.3 - 1.2 mg/dL 1.0   1.1   1.1    Alkaline Phos 38 - 126 U/L 55   56   63    AST 15 - 41 U/L '15   16   19    ' ALT 0 - 44 U/L '13   16   17      ' Lab Results  Component Value Date   MPROTEIN Not Observed 04/07/2022   MPROTEIN Not Observed 03/10/2022   MPROTEIN Not Observed 02/10/2022   Lab Results  Component Value Date   KPAFRELGTCHN 4.5 04/07/2022   KPAFRELGTCHN 4.4 03/10/2022   KPAFRELGTCHN 3.6 02/10/2022   LAMBDASER 2.2 (L) 04/07/2022   LAMBDASER 1.9 (L) 03/10/2022   LAMBDASER 2.1 (L) 02/10/2022   KAPLAMBRATIO 2.05 (H) 04/07/2022   KAPLAMBRATIO 2.32 (H) 03/10/2022   KAPLAMBRATIO 1.71 (H) 02/10/2022    RADIOGRAPHIC STUDIES: CT CERVICAL SPINE WO CONTRAST  Result Date: 03/21/2022 CLINICAL DATA:  Multiple myeloma cervical spine.  Prior surgery EXAM: CT CERVICAL SPINE WITHOUT CONTRAST TECHNIQUE: Multidetector CT imaging of the cervical spine was performed without intravenous contrast. Multiplanar CT image reconstructions were also generated. RADIATION DOSE REDUCTION: This exam was performed according to the departmental dose-optimization program which includes automated exposure  control, adjustment of the mA and/or kV according to patient size and/or use of iterative reconstruction technique. COMPARISON:  CT cervical spine 07/29/2021 FINDINGS: Alignment: Normal Skull base and vertebrae: Extensive destruction of the C2 vertebral body extending into the dens and lamina and pedicle bilaterally. Previously noted loss of height and fracture of the posterior wall has improved in the interval. More subtle lytic lesions are present at C3, C4, C5 unchanged. Lytic lesion also at T2 unchanged. Surgical fixation of the occiput to the cervical spine posteriorly. Posterior rods and screws extend from the occiput put to C5 bilaterally. Hardware appears to be in satisfactory position. Posterior bone graft also present at the surgical levels. Soft tissues and spinal canal: No significant spinal stenosis. No mass or  adenopathy in the neck. Disc levels:  C2-3: Negative for spinal stenosis C3-4: Negative for stenosis. C4-5: Mild disc space narrowing.  Negative for stenosis C5-6: Mild disc degeneration and diffuse uncinate spurring. Mild left foraminal narrowing C6-7: Disc degeneration and diffuse uncinate spurring. No significant stenosis C7-T1: Negative for stenosis Upper chest: 9.5 mm nodule left upper lobe. This is stable from prior chest CT and PET 2022. This was felt to be benign on prior PET. Other: None IMPRESSION: 1. Extensive lytic destruction of C2 compatible with myeloma. Buckling of the posterior wall of C2 with fracture has improved in the interval. No new fracture 2. Lytic lesions throughout the remainder of the cervical spine and T2 are stable. No new fracture elsewhere. 3. Cervical spondylosis.  This is stable from prior studies. 4. 9.5 mm nodule left upper lobe, stable from prior studies and felt to be benign. Electronically Signed   By: Franchot Gallo M.D.   On: 03/21/2022 16:24    ASSESSMENT & PLAN Carol [name] 67 y.o. female with medical history significant for multiple myeloma with  plasmacytoma who presents for a follow up visit.   # Free Kappa Multiple Myeloma # Plasmacytoma  -- original finding of L2 plasmacytoma on biopsy of back lesion, bone marrow biopsy confirms greater than 60% plasma cells consistent with multiple myeloma. --Cycle 1 Day 1 of VRD treatment started on 10/26/2021.  --VRD therapy stopped on 11/25/2021 due to rash. Started Dara/Rev/Dex on 12/16/2021.  --will order monthly SPEP, UPEP, SFLC and beta 2 microglobulin --weekly CBC, CMP, and LDH --Cycle 1 Day 1 of Kd started on 01/06/2022 Plan:  --Labs today were reviewed and adequate for treatment today.  --Cycle 4 Day 15 of Kd therapy today.  --RTC in 2 weeks with interval weekly treatment.   #Leukopenia/Thrombocytopenia/Anemia --WBC 3.1, levels oscillate. Hgb 11.3, stable. Platelet count 101, improving --Likely secondary to chemotherapy --continue to monitor.   #Rash--resolved --Present for approximately one week --Received IV solumedrol and medrol dose pak.  --Suspect drug induced (velcade)  #Thrush, resolved: --Treated with Nystatin mouthwash with improvement.   #Metallic taste/weight loss-improving:  --Under the care of dietician --Encouraged to increase caloric intake and supplement with protein shakes.  #Supportive Care -- chemotherapy education complete  -- port placement complete. -- sent prescription for EMLA cream -- zofran 14m q8H PRN and compazine 12mPO q6H for nausea -- acyclovir 40050mO BID for VCZ prophylaxis -- allopurinol 300m50m daily for TLS prophylaxis -- Start zometa on 02/03/2022. Will continue q 3 months. Next dose in June 2023.  -- no pain medication required at this time.   No orders of the defined types were placed in this encounter.  All questions were answered. The patient knows to call the clinic with any problems, questions or concerns.  I have spent a total of 30 minutes minutes of face-to-face and non-face-to-face time, preparing to see the patient,  performing a medically appropriate examination, counseling and educating the patient, ordering medications/tests, documenting clinical information in the electronic health record, and care coordination.   IrenLincoln Brigham-C Department of Hematology/Oncology [facility]: [phone]/26/2023 12:30 PM

## 2022-04-17 ENCOUNTER — Encounter: Payer: Self-pay | Admitting: Hematology and Oncology

## 2022-04-25 ENCOUNTER — Other Ambulatory Visit: Payer: Self-pay | Admitting: Hematology and Oncology

## 2022-04-28 ENCOUNTER — Inpatient Hospital Stay: Payer: BC Managed Care – PPO

## 2022-04-28 ENCOUNTER — Inpatient Hospital Stay: Payer: BC Managed Care – PPO | Attending: Radiation Oncology

## 2022-04-28 ENCOUNTER — Inpatient Hospital Stay (HOSPITAL_BASED_OUTPATIENT_CLINIC_OR_DEPARTMENT_OTHER): Payer: BC Managed Care – PPO | Admitting: Hematology and Oncology

## 2022-04-28 ENCOUNTER — Other Ambulatory Visit: Payer: Self-pay

## 2022-04-28 VITALS — BP 100/61 | HR 81 | Temp 97.5°F | Resp 17 | Ht 62.0 in | Wt 162.2 lb

## 2022-04-28 DIAGNOSIS — Z95828 Presence of other vascular implants and grafts: Secondary | ICD-10-CM | POA: Diagnosis not present

## 2022-04-28 DIAGNOSIS — Z79899 Other long term (current) drug therapy: Secondary | ICD-10-CM | POA: Insufficient documentation

## 2022-04-28 DIAGNOSIS — C9000 Multiple myeloma not having achieved remission: Secondary | ICD-10-CM | POA: Diagnosis not present

## 2022-04-28 DIAGNOSIS — D72819 Decreased white blood cell count, unspecified: Secondary | ICD-10-CM | POA: Insufficient documentation

## 2022-04-28 DIAGNOSIS — Z5112 Encounter for antineoplastic immunotherapy: Secondary | ICD-10-CM | POA: Insufficient documentation

## 2022-04-28 DIAGNOSIS — C9030 Solitary plasmacytoma not having achieved remission: Secondary | ICD-10-CM | POA: Diagnosis present

## 2022-04-28 DIAGNOSIS — D649 Anemia, unspecified: Secondary | ICD-10-CM | POA: Insufficient documentation

## 2022-04-28 LAB — CBC WITH DIFFERENTIAL (CANCER CENTER ONLY)
Abs Immature Granulocytes: 0.01 10*3/uL (ref 0.00–0.07)
Basophils Absolute: 0 10*3/uL (ref 0.0–0.1)
Basophils Relative: 0 %
Eosinophils Absolute: 0 10*3/uL (ref 0.0–0.5)
Eosinophils Relative: 0 %
HCT: 31.7 % — ABNORMAL LOW (ref 36.0–46.0)
Hemoglobin: 11.3 g/dL — ABNORMAL LOW (ref 12.0–15.0)
Immature Granulocytes: 0 %
Lymphocytes Relative: 10 %
Lymphs Abs: 0.3 10*3/uL — ABNORMAL LOW (ref 0.7–4.0)
MCH: 35.5 pg — ABNORMAL HIGH (ref 26.0–34.0)
MCHC: 35.6 g/dL (ref 30.0–36.0)
MCV: 99.7 fL (ref 80.0–100.0)
Monocytes Absolute: 0.1 10*3/uL (ref 0.1–1.0)
Monocytes Relative: 2 %
Neutro Abs: 2.5 10*3/uL (ref 1.7–7.7)
Neutrophils Relative %: 88 %
Platelet Count: 153 10*3/uL (ref 150–400)
RBC: 3.18 MIL/uL — ABNORMAL LOW (ref 3.87–5.11)
RDW: 13.4 % (ref 11.5–15.5)
WBC Count: 2.9 10*3/uL — ABNORMAL LOW (ref 4.0–10.5)
nRBC: 0 % (ref 0.0–0.2)

## 2022-04-28 LAB — CMP (CANCER CENTER ONLY)
ALT: 14 U/L (ref 0–44)
AST: 17 U/L (ref 15–41)
Albumin: 4.1 g/dL (ref 3.5–5.0)
Alkaline Phosphatase: 62 U/L (ref 38–126)
Anion gap: 7 (ref 5–15)
BUN: 22 mg/dL (ref 8–23)
CO2: 24 mmol/L (ref 22–32)
Calcium: 9.2 mg/dL (ref 8.9–10.3)
Chloride: 110 mmol/L (ref 98–111)
Creatinine: 0.86 mg/dL (ref 0.44–1.00)
GFR, Estimated: >60 mL/min (ref >60)
Glucose, Bld: 208 mg/dL — ABNORMAL HIGH (ref 70–99)
Potassium: 3.7 mmol/L (ref 3.5–5.1)
Sodium: 141 mmol/L (ref 135–145)
Total Bilirubin: 0.7 mg/dL (ref 0.3–1.2)
Total Protein: 5.8 g/dL — ABNORMAL LOW (ref 6.5–8.1)

## 2022-04-28 LAB — LACTATE DEHYDROGENASE: LDH: 198 U/L — ABNORMAL HIGH (ref 98–192)

## 2022-04-28 MED ORDER — SODIUM CHLORIDE 0.9 % IV SOLN: Freq: Once | INTRAVENOUS | Status: AC

## 2022-04-28 MED ORDER — SODIUM CHLORIDE 0.9% FLUSH
10.0000 mL | INTRAVENOUS | Status: DC | PRN
  Administered 2022-04-28: 10 mL

## 2022-04-28 MED ORDER — ZOLEDRONIC ACID 4 MG/100ML IV SOLN: 4.0000 mg | Freq: Once | INTRAVENOUS | Status: DC

## 2022-04-28 MED ORDER — SODIUM CHLORIDE 0.9% FLUSH
10.0000 mL | Freq: Once | INTRAVENOUS | Status: AC
  Administered 2022-04-28: 10 mL

## 2022-04-28 MED ORDER — HEPARIN SOD (PORK) LOCK FLUSH 100 UNIT/ML IV SOLN
500.0000 [IU] | Freq: Once | INTRAVENOUS | Status: AC | PRN
  Administered 2022-04-28: 500 [IU]

## 2022-04-28 MED ORDER — SODIUM CHLORIDE 0.9 % IV SOLN: 40.0000 mg | Freq: Once | INTRAVENOUS | Status: DC | PRN

## 2022-04-28 MED ORDER — DEXTROSE 5 % IV SOLN
70.0000 mg/m2 | Freq: Once | INTRAVENOUS | Status: AC
  Administered 2022-04-28: 130 mg via INTRAVENOUS
  Filled 2022-04-28: qty 60

## 2022-04-28 NOTE — Progress Notes (Signed)
[facility] [phone]   [phone]  PROGRESS NOTE  Patient Care Team: [name] as PCP - General (Physician Assistant)  Hematological/Oncological History # Plasmacytoma with Multiple Myeloma  07/29/2021: CT cervical spine shows expansile lytic lesion which almost completely replaces the normal C2 vertebral body  08/24/2021: PET CT scan shows increased metabolic activity at C2 and C7  08/25/2021: biopsy of C2 lesion showed finding consistent with plasma cell neoplasm.  09/08/2021: start radiation therapy 09/12/2021: establish care with Dr. Lorenso Courier  09/28/2021: end radiation therapy 10/10/2021: Bone marrow biopsy. Results confirm plasma cell neoplasm consistent with multiple myeloma, kappa restricted.  10/26/2021: Cycle 1 Day 1 of VRD 11/18/2021: Cycle 2 Day 1 of VRD 11/25/2021: Velcade HELD due to full body rash. Resolved with steroid therapy.  12/16/2021: Cycle 1 Day 1 of Dara/Rev/Dex 12/30/2021: HELD Daratumumab due to toxicity. Patient unable to tolerate. Requested holding the medication  01/06/2022: Cycle 1 Day 1 of Kd  02/03/2022: Cycle 2 Day 1 of Kd 03/03/2022: Cycle 3 Day 1 of Kd 03/31/2022: Cycle 4 Day 1 of Kd 04/28/2022: Cycle 5 Day 1 of Kd  Interval History:  Carol [name] 66 y.o. female with medical history significant for multiple myeloma with plasmacytoma who presents for a follow up visit. The patient's last visit was on 04/14/2022. Today is Cycle 5 Day 1 of Kd.   On exam today Carol [name] reports she has been well in the interim since her last visit.  She is not having any issues with nausea, vomiting, or diarrhea.  She reports she is not suffering from any neuropathy.  She notes that her appetite is coming back though she still has some metallic taste in her mouth.  She been going to the gym and trying to work out more to help build up her strength.  She notes that she does have some difficulty walking up steps.  She notes that she can  only walk up about 4 steps before her legs fatigue on her.  She reports that she is prone to bouts of constipation with some occasional bouts of diarrhea, but right now she is more on the constipated side.  She reports she has medications for both situations..  She denies fevers, chills, night sweats, shortness of breath, chest pain or cough. She has no other complaints. Rest of the 10 point ROS is below.  MEDICAL HISTORY:  Past Medical History:  Diagnosis Date   Allergy    Ankle edema    both ankles   Arthritis    hands,knees,elbows   Cancer ([place]) 2016   tumor kidney   Diabetes mellitus    type 2    Eczema    History of kidney stones    Hypothyroidism    Obesity    Sleep apnea    cpap machine setting of 3   Thyroid disease     SURGICAL HISTORY: Past Surgical History:  Procedure Laterality Date   BREAST BIOPSY Right 2001   CHOLECYSTECTOMY  2009   open   COLONOSCOPY     CYSTOSCOPY W/ RETROGRADES Left 11/22/2018   Procedure: CYSTOSCOPY WITH RETROGRADE PYELOGRAM;  Surgeon: Ardis Hughs, MD;  Location: [facility];  Service: Urology;  Laterality: Left;   CYSTOSCOPY WITH RETROGRADE PYELOGRAM, URETEROSCOPY AND STENT PLACEMENT Right 10/03/2021   Procedure: CYSTOSCOPY WITH RETROGRADE PYELOGRAM, URETEROSCOPY,STONE EXTRACTION AND STENT PLACEMENT;  Surgeon: Ceasar Mons, MD;  Location: [facility];  Service: Urology;  Laterality: Right;   DILATATION &  CURRETTAGE/HYSTEROSCOPY WITH RESECTOCOPE N/A 01/21/2013   Procedure: DILATATION & CURETTAGE/HYSTEROSCOPY WITH RESECTOCOPE AND RESECTION OF ENDOMETRIAL;  Surgeon: Selinda Orion, MD;  Location: [facility];  Service: Gynecology;  Laterality: N/A;   IR IMAGING GUIDED PORT INSERTION  02/01/2022   KNEE ARTHROSCOPY     left    POLYPECTOMY     POSTERIOR CERVICAL FUSION/FORAMINOTOMY N/A 08/25/2021   Procedure: Occiput to Cervical 5 Posterior cervical instrumented fusion with open biopsy of Cervical 2 Mass. Cervical Two  Ganglionectomy;  Surgeon: Judith Part, MD;  Location: [facility];  Service: Neurosurgery;  Laterality: N/A;   ROBOT ASSISTED PYELOPLASTY Left 11/22/2018   Procedure: XI ROBOTIC ASSISTED ATTEMPTED PYELOPLASTY CONVERTED TO NEPHRECTOMY/LYSIS OF ADHESIONS/UMBILICAL HERNIA REPAIR;  Surgeon: Ardis Hughs, MD;  Location: [facility];  Service: Urology;  Laterality: Left;   ROBOTIC ASSITED PARTIAL NEPHRECTOMY Left 06/11/2015   Procedure: LEFT ROBOTIC ASSISTED LAPAROSCOPIC  PARTIAL NEPHRECTOMY;  Surgeon: Ardis Hughs, MD;  Location: [facility];  Service: Urology;  Laterality: Left;   ROTATOR CUFF REPAIR Left 2011   URETERAL BIOPSY Left 02/14/2017   Procedure: URETERAL BIOPSY;  Surgeon: Ardis Hughs, MD;  Location: [facility];  Service: Urology;  Laterality: Left;   URETEROSCOPY WITH HOLMIUM LASER LITHOTRIPSY Left 02/14/2017   Procedure: URETEROSCOPY LITHOTRIPSY, URETERAL BALLOON DILATION;  Surgeon: Ardis Hughs, MD;  Location: [facility];  Service: Urology;  Laterality: Left;  With STENT    SOCIAL HISTORY: Social History   Socioeconomic History   Marital status: Married    Spouse name: Not on file   Number of children: Not on file   Years of education: Not on file   Highest education level: Not on file  Occupational History   Not on file  Tobacco Use   Smoking status: Never   Smokeless tobacco: Never  Vaping Use   Vaping Use: Never used  Substance and Sexual Activity   Alcohol use: Yes    Comment: occ   Drug use: No   Sexual activity: Not on file  Other Topics Concern   Not on file  Social History Narrative   Not on file   Social Determinants of Health   Financial Resource Strain: Low Risk  (11/22/2018)   Overall Financial Resource Strain (CARDIA)    Difficulty of Paying Living Expenses: Not hard at all  Food Insecurity: Unknown (11/22/2018)   Hunger Vital Sign    Worried About Running Out of Food in the Last Year: Patient refused    [place] in the Last Year: Patient  refused  Transportation Needs: Unknown (11/22/2018)   PRAPARE - Transportation    Lack of Transportation (Medical): Patient refused    Lack of Transportation (Non-Medical): Patient refused  Physical Activity: Unknown (11/22/2018)   Exercise Vital Sign    Days of Exercise per Week: Patient refused    Minutes of Exercise per Session: Patient refused  Stress: No Stress Concern Present (11/22/2018)   [employer] [place] of Stress : Not at all  Social Connections: Unknown (11/22/2018)   Social Connection and Isolation Panel [NHANES]    Frequency of Communication with Friends and Family: Patient refused    Frequency of Social Gatherings with Friends and Family: Patient refused    Attends Religious Services: Patient refused    Active Member of Clubs or Organizations: Patient refused    Attends Archivist Meetings: Patient refused    Marital Status:  Patient refused  Intimate Partner Violence: Unknown (11/22/2018)   Humiliation, Afraid, Rape, and Kick questionnaire    Fear of Current or Ex-Partner: Patient refused    Emotionally Abused: Patient refused    Physically Abused: Patient refused    Sexually Abused: Patient refused    FAMILY HISTORY: Family History  Problem Relation Age of Onset   Cancer Father     ALLERGIES:  is allergic to morphine.  MEDICATIONS:  Current Outpatient Medications  Medication Sig Dispense Refill   acyclovir (ZOVIRAX) 400 MG tablet TAKE 1 TABLET BY MOUTH TWICE A DAY 180 tablet 1   allopurinol (ZYLOPRIM) 300 MG tablet TAKE 1 TABLET BY MOUTH EVERY DAY 90 tablet 1   Ascorbic Acid (VITAMIN C ADULT GUMMIES PO) Take 1 tablet by mouth daily.     aspirin EC 81 MG tablet Take 1 tablet (81 mg total) by mouth daily. Swallow whole. 90 tablet 3   atorvastatin (LIPITOR) 20 MG tablet Take 20 mg by mouth at bedtime.     dexamethasone (DECADRON) 4 MG tablet Take by mouth.     fexofenadine (ALLEGRA) 180 MG  tablet Take 180 mg by mouth daily.     furosemide (LASIX) 20 MG tablet Take 20 mg by mouth daily as needed for fluid or edema.     HEMADY 20 MG TABS TAKE 40 MG BY MOUTH ONCE A WEEK. 2 TABLETS ON DAY OF CHEMO TREATMENT. 60 tablet 3   KLOR-CON M20 20 MEQ tablet TAKE 1 TABLET BY MOUTH EVERY DAY 30 tablet 1   levothyroxine (SYNTHROID) 50 MCG tablet Take 50 mcg by mouth daily before breakfast.     lidocaine-prilocaine (EMLA) cream Apply 1 application. topically as needed. 30 g 0   Magnesium Hydroxide (MILK OF MAGNESIA PO) Take by mouth. 2 times a week     ondansetron (ZOFRAN) 8 MG tablet Take 1 tablet (8 mg total) by mouth every 8 (eight) hours as needed. (Patient not taking: Reported on 03/17/2022) 30 tablet 0   OZEMPIC, 0.25 OR 0.5 MG/DOSE, 2 MG/1.5ML SOPN Inject 0.5 mg into the skin every Saturday.     PROAIR HFA 108 (90 BASE) MCG/ACT inhaler Inhale 2 puffs into the lungs every 4 (four) hours as needed for wheezing or shortness of breath. Uses seasonal (Patient not taking: Reported on 03/17/2022)  3   prochlorperazine (COMPAZINE) 10 MG tablet Take 1 tablet (10 mg total) by mouth every 6 (six) hours as needed for nausea or vomiting. (Patient not taking: Reported on 03/17/2022) 30 tablet 0   Sennosides (SENOKOT PO) Take by mouth. Bid     Vitamin D, Ergocalciferol, (DRISDOL) 1.25 MG (50000 UNIT) CAPS capsule Take 50,000 Units by mouth once a week.     No current facility-administered medications for this visit.    REVIEW OF SYSTEMS:   Constitutional: ( - ) fevers, ( - )  chills , ( - ) night sweats Eyes: ( - ) blurriness of vision, ( - ) double vision, ( - ) watery eyes Ears, nose, mouth, throat, and face: ( - ) mucositis, ( - ) sore throat Respiratory: ( - ) cough, ( - ) dyspnea, ( - ) wheezes Cardiovascular: ( - ) palpitation, ( - ) chest discomfort, ( - ) lower extremity swelling Gastrointestinal:  ( - ) nausea, ( - ) heartburn, ( -) change in bowel habits Skin: ( - ) abnormal skin  rashes Lymphatics: ( - ) new lymphadenopathy, ( - ) easy bruising Neurological: ( - ) numbness, ( - )  tingling, ( - ) new weaknesses Behavioral/Psych: ( - ) mood change, ( - ) new changes  All other systems were reviewed with the patient and are negative.  PHYSICAL EXAMINATION: ECOG PERFORMANCE STATUS: 1 - Symptomatic but completely ambulatory  Vitals:   04/28/22 0838  BP: 100/61  Pulse: 81  Resp: 17  Temp: (!) 97.5 F (36.4 C)  SpO2: 100%     Filed Weights   04/28/22 0838  Weight: 162 lb 3.2 oz (73.6 kg)    GENERAL: Well-appearing middle-age Caucasian female alert, no distress and comfortable SKIN: No evidence of rash, erythema, dry skin, or raised lesions. EYES: conjunctiva are pink and non-injected, sclera clear LUNGS: clear to auscultation and percussion with normal breathing effort HEART: regular rate & rhythm and no murmurs and no lower extremity edema Musculoskeletal: no cyanosis of digits and no clubbing  PSYCH: alert & oriented x 3, fluent speech NEURO: no focal motor/sensory deficits  LABORATORY DATA:  I have reviewed the data as listed    Latest Ref Rng & Units 04/28/2022    8:17 AM 04/14/2022    9:40 AM 04/07/2022    2:17 PM  CBC  WBC 4.0 - 10.5 K/uL 2.9  3.1  2.5   Hemoglobin 12.0 - 15.0 g/dL 11.3  11.3  11.7   Hematocrit 36.0 - 46.0 % 31.7  31.0  32.4   Platelets 150 - 400 K/uL 153  101  94        Latest Ref Rng & Units 04/28/2022    8:17 AM 04/14/2022    9:40 AM 04/07/2022    2:17 PM  CMP  Glucose 70 - 99 mg/dL 208  145  181   BUN 8 - 23 mg/dL _0 Creatinine 0.44 - 1.00 mg/dL 0.86  0.96  0.85   Sodium 135 - 145 mmol/L 141  141  142   Potassium 3.5 - 5.1 mmol/L 3.7  4.3  4.0   Chloride 98 - 111 mmol/L 110  109  108   CO2 22 - 32 mmol/L _1 Calcium 8.9 - 10.3 mg/dL 9.2  9.5  9.6   Total Protein 6.5 - 8.1 g/dL 5.8  5.9  6.3   Total Bilirubin 0.3 - 1.2 mg/dL 0.7  1.0  1.1   Alkaline Phos 38 - 126 U/L 62  55  56   AST 15 - 41 U/L _2 ALT 0 - 44 U/L _3 Lab Results  Component Value Date   MPROTEIN Not Observed 04/07/2022   MPROTEIN Not Observed 03/10/2022   MPROTEIN Not Observed 02/10/2022   Lab Results  Component Value Date   KPAFRELGTCHN 4.5 04/07/2022   KPAFRELGTCHN 4.4 03/10/2022   KPAFRELGTCHN 3.6 02/10/2022   LAMBDASER 2.2 (L) 04/07/2022   LAMBDASER 1.9 (L) 03/10/2022   LAMBDASER 2.1 (L) 02/10/2022   KAPLAMBRATIO 2.05 (H) 04/07/2022   KAPLAMBRATIO 2.32 (H) 03/10/2022   KAPLAMBRATIO 1.71 (H) 02/10/2022    RADIOGRAPHIC STUDIES: No results found.  ASSESSMENT & PLAN Carol [name] 66 y.o. female with medical history significant for multiple myeloma with plasmacytoma who presents for a follow up visit.   # Free Kappa Multiple Myeloma # Plasmacytoma  -- original finding of L2 plasmacytoma on biopsy of back lesion, bone marrow biopsy confirms greater than 60% plasma cells consistent with multiple myeloma. --Cycle 1 Day 1 of VRD treatment  started on 10/26/2021.  --VRD therapy stopped on 11/25/2021 due to rash. Started Dara/Rev/Dex on 12/16/2021.  --will order monthly SPEP, UPEP, SFLC and beta 2 microglobulin --weekly CBC, CMP, and LDH --Cycle 1 Day 1 of Kd started on 01/06/2022 Plan:  --Labs today were reviewed and adequate for treatment today.  --Cycle 5 Day 1 of Kd therapy today.  --upcoming neck surgery on 06/05/2022. Will HOLD chemo on 7/14 and regroup on 06/16/2022 to discuss restarting.  --RTC in 2 weeks with interval weekly treatment.   #Leukopenia/Thrombocytopenia/Anemia --WBC 2.9, levels oscillate. Hgb 11.3, stable. Platelet count 153 --Likely secondary to chemotherapy --continue to monitor.   #Rash--resolved --Present for approximately one week --Received IV solumedrol and medrol dose pak.  --Suspect drug induced (velcade)  #Thrush, resolved: --Treated with Nystatin mouthwash with improvement.   #Metallic taste/weight loss-improving:  --Encouraged to increase caloric  intake and supplement with protein shakes.  #Supportive Care -- chemotherapy education complete  -- port placement complete. -- sent prescription for EMLA cream -- zofran 65m q8H PRN and compazine 136mPO q6H for nausea -- acyclovir 40017mO BID for VCZ prophylaxis -- allopurinol 300m37m daily for TLS prophylaxis -- Started zometa on 02/03/2022. Will continue q 3 months.HOLD zometa in June 2023 due to upcoming spine surgery, will restart sometime thereafter.  -- no pain medication required at this time.   No orders of the defined types were placed in this encounter.  All questions were answered. The patient knows to call the clinic with any problems, questions or concerns.  I have spent a total of 30 minutes minutes of face-to-face and non-face-to-face time, preparing to see the patient, performing a medically appropriate examination, counseling and educating the patient, ordering medications/tests, documenting clinical information in the electronic health record, and care coordination.   JohnLedell Peoples Department of Hematology/Oncology [facility]: [phone]: [phone]: [email or web address]  04/28/2022 9:26 AM

## 2022-04-28 NOTE — Progress Notes (Signed)
Pt states she took '40mg'$  of Dexamethazone at home today prior to arriving to infusion clinic. Pharmacy notified.

## 2022-04-28 NOTE — Progress Notes (Signed)
Zometa on hold until further notice per Dr. Sylvan Cheese, Pharm.D., CPP 04/28/2022'@9'$ :58 AM

## 2022-05-05 ENCOUNTER — Inpatient Hospital Stay: Payer: BC Managed Care – PPO

## 2022-05-05 ENCOUNTER — Inpatient Hospital Stay: Payer: BC Managed Care – PPO | Admitting: Dietician

## 2022-05-05 ENCOUNTER — Other Ambulatory Visit: Payer: Self-pay

## 2022-05-05 VITALS — BP 117/59 | HR 83 | Temp 98.7°F | Resp 18 | Wt 159.1 lb

## 2022-05-05 DIAGNOSIS — C9000 Multiple myeloma not having achieved remission: Secondary | ICD-10-CM

## 2022-05-05 DIAGNOSIS — Z95828 Presence of other vascular implants and grafts: Secondary | ICD-10-CM

## 2022-05-05 DIAGNOSIS — C9030 Solitary plasmacytoma not having achieved remission: Secondary | ICD-10-CM | POA: Diagnosis not present

## 2022-05-05 LAB — CMP (CANCER CENTER ONLY)
ALT: 13 U/L (ref 0–44)
AST: 15 U/L (ref 15–41)
Albumin: 4.2 g/dL (ref 3.5–5.0)
Alkaline Phosphatase: 59 U/L (ref 38–126)
Anion gap: 7 (ref 5–15)
BUN: 20 mg/dL (ref 8–23)
CO2: 26 mmol/L (ref 22–32)
Calcium: 9.6 mg/dL (ref 8.9–10.3)
Chloride: 109 mmol/L (ref 98–111)
Creatinine: 1.07 mg/dL — ABNORMAL HIGH (ref 0.44–1.00)
GFR, Estimated: 57 mL/min — ABNORMAL LOW (ref >60)
Glucose, Bld: 171 mg/dL — ABNORMAL HIGH (ref 70–99)
Potassium: 4 mmol/L (ref 3.5–5.1)
Sodium: 142 mmol/L (ref 135–145)
Total Bilirubin: 1 mg/dL (ref 0.3–1.2)
Total Protein: 6.1 g/dL — ABNORMAL LOW (ref 6.5–8.1)

## 2022-05-05 LAB — CBC WITH DIFFERENTIAL (CANCER CENTER ONLY)
Abs Immature Granulocytes: 0.01 10*3/uL (ref 0.00–0.07)
Basophils Absolute: 0 10*3/uL (ref 0.0–0.1)
Basophils Relative: 0 %
Eosinophils Absolute: 0 10*3/uL (ref 0.0–0.5)
Eosinophils Relative: 0 %
HCT: 33.5 % — ABNORMAL LOW (ref 36.0–46.0)
Hemoglobin: 11.8 g/dL — ABNORMAL LOW (ref 12.0–15.0)
Immature Granulocytes: 0 %
Lymphocytes Relative: 14 %
Lymphs Abs: 0.3 10*3/uL — ABNORMAL LOW (ref 0.7–4.0)
MCH: 35 pg — ABNORMAL HIGH (ref 26.0–34.0)
MCHC: 35.2 g/dL (ref 30.0–36.0)
MCV: 99.4 fL (ref 80.0–100.0)
Monocytes Absolute: 0.1 10*3/uL (ref 0.1–1.0)
Monocytes Relative: 3 %
Neutro Abs: 2.1 10*3/uL (ref 1.7–7.7)
Neutrophils Relative %: 83 %
Platelet Count: 95 10*3/uL — ABNORMAL LOW (ref 150–400)
RBC: 3.37 MIL/uL — ABNORMAL LOW (ref 3.87–5.11)
RDW: 13 % (ref 11.5–15.5)
WBC Count: 2.5 10*3/uL — ABNORMAL LOW (ref 4.0–10.5)
nRBC: 0 % (ref 0.0–0.2)

## 2022-05-05 LAB — LACTATE DEHYDROGENASE: LDH: 183 U/L (ref 98–192)

## 2022-05-05 MED ORDER — SODIUM CHLORIDE 0.9 % IV SOLN: Freq: Once | INTRAVENOUS | Status: AC

## 2022-05-05 MED ORDER — ZOLEDRONIC ACID 4 MG/100ML IV SOLN: 4.0000 mg | Freq: Once | INTRAVENOUS | Status: DC

## 2022-05-05 MED ORDER — SODIUM CHLORIDE 0.9% FLUSH
10.0000 mL | INTRAVENOUS | Status: DC | PRN
  Administered 2022-05-05: 10 mL

## 2022-05-05 MED ORDER — DEXTROSE 5 % IV SOLN
70.0000 mg/m2 | Freq: Once | INTRAVENOUS | Status: AC
  Administered 2022-05-05: 130 mg via INTRAVENOUS
  Filled 2022-05-05: qty 60

## 2022-05-05 MED ORDER — HEPARIN SOD (PORK) LOCK FLUSH 100 UNIT/ML IV SOLN
500.0000 [IU] | Freq: Once | INTRAVENOUS | Status: AC | PRN
  Administered 2022-05-05: 500 [IU]

## 2022-05-05 MED ORDER — SODIUM CHLORIDE 0.9% FLUSH
10.0000 mL | Freq: Once | INTRAVENOUS | Status: AC
  Administered 2022-05-05: 10 mL

## 2022-05-05 NOTE — Progress Notes (Signed)
OK to treat with plt count of 95k per Dr. Lorenso Courier

## 2022-05-05 NOTE — Progress Notes (Signed)
Pt states she took Dexamethazone PO '40mg'$  prior to arriving for infusion today.  Zometa not given today due to impending scheduled neck surgery on July 17th.

## 2022-05-05 NOTE — Patient Instructions (Addendum)
[facility]  Discharge Instructions: Thank you for choosing [facility] to provide your oncology and hematology care.   If you have a lab appointment with the Cancer Center, please go directly to the Cancer Center and check in at the registration area.   Wear comfortable clothing and clothing appropriate for easy access to any Portacath or PICC line.   We strive to give you quality time with your provider. You may need to reschedule your appointment if you arrive late (15 or more minutes).  Arriving late affects you and other patients whose appointments are after yours.  Also, if you miss three or more appointments without notifying the office, you may be dismissed from the clinic at the provider's discretion.      For prescription refill requests, have your pharmacy contact our office and allow 72 hours for refills to be completed.    Today you received the following chemotherapy and/or immunotherapy agents: Kyprolis      To help prevent nausea and vomiting after your treatment, we encourage you to take your nausea medication as directed.  BELOW ARE SYMPTOMS THAT SHOULD BE REPORTED IMMEDIATELY: *FEVER GREATER THAN 100.4 F (38 C) OR HIGHER *CHILLS OR SWEATING *NAUSEA AND VOMITING THAT IS NOT CONTROLLED WITH YOUR NAUSEA MEDICATION *UNUSUAL SHORTNESS OF BREATH *UNUSUAL BRUISING OR BLEEDING *URINARY PROBLEMS (pain or burning when urinating, or frequent urination) *BOWEL PROBLEMS (unusual diarrhea, constipation, pain near the anus) TENDERNESS IN MOUTH AND THROAT WITH OR WITHOUT PRESENCE OF ULCERS (sore throat, sores in mouth, or a toothache) UNUSUAL RASH, SWELLING OR PAIN  UNUSUAL VAGINAL DISCHARGE OR ITCHING   Items with * indicate a potential emergency and should be followed up as soon as possible or go to the Emergency Department if any problems should occur.  Please show the CHEMOTHERAPY ALERT CARD or IMMUNOTHERAPY ALERT CARD at check-in to  the Emergency Department and triage nurse.  Should you have questions after your visit or need to cancel or reschedule your appointment, please contact [facility]  Dept: [phone]  and follow the prompts.  Office hours are 8:00 a.m. to 4:30 p.m. Monday - Friday. Please note that voicemails left after 4:00 p.m. may not be returned until the following business day.  We are closed weekends and major holidays. You have access to a nurse at all times for urgent questions. Please call the main number to the clinic Dept: [phone] and follow the prompts.   For any non-urgent questions, you may also contact your provider using MyChart. We now offer e-Visits for anyone 18 and older to request care online for non-urgent symptoms. For details visit [email or web address].   Also download the MyChart app! Go to the app store, search "MyChart", open the app, select , and log in with your MyChart username and password.  Masks are optional in the cancer centers. If you would like for your care team to wear a mask while they are taking care of you, please let them know. For doctor visits, patients may have with them one support person who is at least 67 years old. At this time, visitors are not allowed in the infusion area. 

## 2022-05-08 LAB — KAPPA/LAMBDA LIGHT CHAINS
Kappa free light chain: 3.3 mg/L (ref 3.3–19.4)
Kappa, lambda light chain ratio: 1.65 (ref 0.26–1.65)
Lambda free light chains: 2 mg/L — ABNORMAL LOW (ref 5.7–26.3)

## 2022-05-11 LAB — MULTIPLE MYELOMA PANEL, SERUM
Albumin SerPl Elph-Mcnc: 3.8 g/dL (ref 2.9–4.4)
Albumin/Glob SerPl: 2.2 — ABNORMAL HIGH (ref 0.7–1.7)
Alpha 1: 0.2 g/dL (ref 0.0–0.4)
Alpha2 Glob SerPl Elph-Mcnc: 0.6 g/dL (ref 0.4–1.0)
B-Globulin SerPl Elph-Mcnc: 0.8 g/dL (ref 0.7–1.3)
Gamma Glob SerPl Elph-Mcnc: 0.2 g/dL — ABNORMAL LOW (ref 0.4–1.8)
Globulin, Total: 1.8 g/dL — ABNORMAL LOW (ref 2.2–3.9)
IgA: 5 mg/dL — ABNORMAL LOW (ref 87–352)
IgG (Immunoglobin G), Serum: 154 mg/dL — ABNORMAL LOW (ref 586–1602)
IgM (Immunoglobulin M), Srm: <5 mg/dL — ABNORMAL LOW (ref 26–217)
Total Protein ELP: 5.6 g/dL — ABNORMAL LOW (ref 6.0–8.5)

## 2022-05-12 ENCOUNTER — Inpatient Hospital Stay: Payer: BC Managed Care – PPO

## 2022-05-12 ENCOUNTER — Inpatient Hospital Stay (HOSPITAL_BASED_OUTPATIENT_CLINIC_OR_DEPARTMENT_OTHER): Payer: BC Managed Care – PPO | Admitting: Hematology and Oncology

## 2022-05-12 ENCOUNTER — Other Ambulatory Visit: Payer: Self-pay

## 2022-05-12 VITALS — BP 100/50 | HR 77 | Temp 97.7°F | Resp 18 | Ht 62.0 in | Wt 162.1 lb

## 2022-05-12 DIAGNOSIS — Z95828 Presence of other vascular implants and grafts: Secondary | ICD-10-CM

## 2022-05-12 DIAGNOSIS — C9000 Multiple myeloma not having achieved remission: Secondary | ICD-10-CM

## 2022-05-12 DIAGNOSIS — C9030 Solitary plasmacytoma not having achieved remission: Secondary | ICD-10-CM | POA: Diagnosis not present

## 2022-05-12 LAB — CBC WITH DIFFERENTIAL (CANCER CENTER ONLY)
Abs Immature Granulocytes: 0.01 10*3/uL (ref 0.00–0.07)
Basophils Absolute: 0 10*3/uL (ref 0.0–0.1)
Basophils Relative: 0 %
Eosinophils Absolute: 0 10*3/uL (ref 0.0–0.5)
Eosinophils Relative: 0 %
HCT: 31.9 % — ABNORMAL LOW (ref 36.0–46.0)
Hemoglobin: 11.3 g/dL — ABNORMAL LOW (ref 12.0–15.0)
Immature Granulocytes: 0 %
Lymphocytes Relative: 7 %
Lymphs Abs: 0.4 10*3/uL — ABNORMAL LOW (ref 0.7–4.0)
MCH: 35.3 pg — ABNORMAL HIGH (ref 26.0–34.0)
MCHC: 35.4 g/dL (ref 30.0–36.0)
MCV: 99.7 fL (ref 80.0–100.0)
Monocytes Absolute: 0.1 10*3/uL (ref 0.1–1.0)
Monocytes Relative: 3 %
Neutro Abs: 4.4 10*3/uL (ref 1.7–7.7)
Neutrophils Relative %: 90 %
Platelet Count: 92 10*3/uL — ABNORMAL LOW (ref 150–400)
RBC: 3.2 MIL/uL — ABNORMAL LOW (ref 3.87–5.11)
RDW: 13 % (ref 11.5–15.5)
WBC Count: 5 10*3/uL (ref 4.0–10.5)
nRBC: 0 % (ref 0.0–0.2)

## 2022-05-12 LAB — CMP (CANCER CENTER ONLY)
ALT: 12 U/L (ref 0–44)
AST: 14 U/L — ABNORMAL LOW (ref 15–41)
Albumin: 3.9 g/dL (ref 3.5–5.0)
Alkaline Phosphatase: 57 U/L (ref 38–126)
Anion gap: 8 (ref 5–15)
BUN: 26 mg/dL — ABNORMAL HIGH (ref 8–23)
CO2: 24 mmol/L (ref 22–32)
Calcium: 9.4 mg/dL (ref 8.9–10.3)
Chloride: 109 mmol/L (ref 98–111)
Creatinine: 0.99 mg/dL (ref 0.44–1.00)
GFR, Estimated: >60 mL/min (ref >60)
Glucose, Bld: 216 mg/dL — ABNORMAL HIGH (ref 70–99)
Potassium: 4 mmol/L (ref 3.5–5.1)
Sodium: 141 mmol/L (ref 135–145)
Total Bilirubin: 0.8 mg/dL (ref 0.3–1.2)
Total Protein: 5.5 g/dL — ABNORMAL LOW (ref 6.5–8.1)

## 2022-05-12 LAB — LACTATE DEHYDROGENASE: LDH: 198 U/L — ABNORMAL HIGH (ref 98–192)

## 2022-05-12 MED ORDER — SODIUM CHLORIDE 0.9 % IV SOLN: Freq: Once | INTRAVENOUS | Status: AC

## 2022-05-12 MED ORDER — DEXTROSE 5 % IV SOLN
70.0000 mg/m2 | Freq: Once | INTRAVENOUS | Status: AC
  Administered 2022-05-12: 130 mg via INTRAVENOUS
  Filled 2022-05-12: qty 60

## 2022-05-12 MED ORDER — SODIUM CHLORIDE 0.9 % IV SOLN: 40.0000 mg | Freq: Once | INTRAVENOUS | Status: DC | PRN

## 2022-05-12 MED ORDER — SODIUM CHLORIDE 0.9% FLUSH
10.0000 mL | Freq: Once | INTRAVENOUS | Status: AC
  Administered 2022-05-12: 10 mL

## 2022-05-12 NOTE — Progress Notes (Signed)
Per Dr. Leonides Schanz ok to treat with low platelets

## 2022-05-12 NOTE — Progress Notes (Signed)
[facility] [phone]   [phone]  PROGRESS NOTE  Patient Care Team: Barbarann Ehlers as PCP - General (Physician Assistant)  Hematological/Oncological History # Plasmacytoma with Multiple Myeloma  07/29/2021: CT cervical spine shows expansile lytic lesion which almost completely replaces the normal C2 vertebral body  08/24/2021: PET CT scan shows increased metabolic activity at C2 and C7  08/25/2021: biopsy of C2 lesion showed finding consistent with plasma cell neoplasm.  09/08/2021: start radiation therapy 09/12/2021: establish care with Dr. Leonides Schanz  09/28/2021: end radiation therapy 10/10/2021: Bone marrow biopsy. Results confirm plasma cell neoplasm consistent with multiple myeloma, kappa restricted.  10/26/2021: Cycle 1 Day 1 of VRD 11/18/2021: Cycle 2 Day 1 of VRD 11/25/2021: Velcade HELD due to full body rash. Resolved with steroid therapy.  12/16/2021: Cycle 1 Day 1 of Dara/Rev/Dex 12/30/2021: HELD Daratumumab due to toxicity. Patient unable to tolerate. Requested holding the medication  01/06/2022: Cycle 1 Day 1 of Kd  02/03/2022: Cycle 2 Day 1 of Kd 03/03/2022: Cycle 3 Day 1 of Kd 03/31/2022: Cycle 4 Day 1 of Kd 04/28/2022: Cycle 5 Day 1 of Kd 05/26/2022: anticipated Cycle 6 Day 1 of Kd   Interval History:  Carol [name] 66 y.o. female with medical history significant for multiple myeloma with plasmacytoma who presents for a follow up visit. The patient's last visit was on 04/14/2022. Today is Cycle 5 Day 15 of Kd.   On exam today Carol [name] reports she does have some difficulty with sleeping on the weekends but that she has "plenty of energy".  She notes she is not noticing any major side effects as result of her current chemotherapy treatment.  She reports has been going to her gym and trying to walk and climb steps.  Overall she feels well.  She reports he is not having any numbness or tingling of the fingers or toes.  She is not having any  nausea, vomiting.  She does have "alternating diarrhea and constipation".  She denies fevers, chills, night sweats, shortness of breath, chest pain or cough. She has no other complaints. Rest of the 10 point ROS is below.  MEDICAL HISTORY:  Past Medical History:  Diagnosis Date   Allergy    Ankle edema    both ankles   Arthritis    hands,knees,elbows   Cancer (HCC) 2016   tumor kidney   Diabetes mellitus    type 2    Eczema    History of kidney stones    Hypothyroidism    Obesity    Sleep apnea    cpap machine setting of 3   Thyroid disease     SURGICAL HISTORY: Past Surgical History:  Procedure Laterality Date   BREAST BIOPSY Right 2001   CHOLECYSTECTOMY  2009   open   COLONOSCOPY     CYSTOSCOPY W/ RETROGRADES Left 11/22/2018   Procedure: CYSTOSCOPY WITH RETROGRADE PYELOGRAM;  Surgeon: Crist Fat, MD;  Location: [facility];  Service: Urology;  Laterality: Left;   CYSTOSCOPY WITH RETROGRADE PYELOGRAM, URETEROSCOPY AND STENT PLACEMENT Right 10/03/2021   Procedure: CYSTOSCOPY WITH RETROGRADE PYELOGRAM, URETEROSCOPY,STONE EXTRACTION AND STENT PLACEMENT;  Surgeon: Rene Paci, MD;  Location: [facility];  Service: Urology;  Laterality: Right;   DILATATION & CURRETTAGE/HYSTEROSCOPY WITH RESECTOCOPE N/A 01/21/2013   Procedure: DILATATION & CURETTAGE/HYSTEROSCOPY WITH RESECTOCOPE AND RESECTION OF ENDOMETRIAL;  Surgeon: Gretta Cool, MD;  Location: [facility];  Service: Gynecology;  Laterality: N/A;   IR IMAGING GUIDED  PORT INSERTION  02/01/2022   KNEE ARTHROSCOPY     left    POLYPECTOMY     POSTERIOR CERVICAL FUSION/FORAMINOTOMY N/A 08/25/2021   Procedure: Occiput to Cervical 5 Posterior cervical instrumented fusion with open biopsy of Cervical 2 Mass. Cervical Two Ganglionectomy;  Surgeon: Jadene Pierini, MD;  Location: [facility];  Service: Neurosurgery;  Laterality: N/A;   ROBOT ASSISTED PYELOPLASTY Left 11/22/2018   Procedure: XI ROBOTIC ASSISTED  ATTEMPTED PYELOPLASTY CONVERTED TO NEPHRECTOMY/LYSIS OF ADHESIONS/UMBILICAL HERNIA REPAIR;  Surgeon: Crist Fat, MD;  Location: [facility];  Service: Urology;  Laterality: Left;   ROBOTIC ASSITED PARTIAL NEPHRECTOMY Left 06/11/2015   Procedure: LEFT ROBOTIC ASSISTED LAPAROSCOPIC  PARTIAL NEPHRECTOMY;  Surgeon: Crist Fat, MD;  Location: [facility];  Service: Urology;  Laterality: Left;   ROTATOR CUFF REPAIR Left 2011   URETERAL BIOPSY Left 02/14/2017   Procedure: URETERAL BIOPSY;  Surgeon: Crist Fat, MD;  Location: [facility];  Service: Urology;  Laterality: Left;   URETEROSCOPY WITH HOLMIUM LASER LITHOTRIPSY Left 02/14/2017   Procedure: URETEROSCOPY LITHOTRIPSY, URETERAL BALLOON DILATION;  Surgeon: Crist Fat, MD;  Location: [facility];  Service: Urology;  Laterality: Left;  With STENT    SOCIAL HISTORY: Social History   Socioeconomic History   Marital status: Married    Spouse name: Not on file   Number of children: Not on file   Years of education: Not on file   Highest education level: Not on file  Occupational History   Not on file  Tobacco Use   Smoking status: Never   Smokeless tobacco: Never  Vaping Use   Vaping Use: Never used  Substance and Sexual Activity   Alcohol use: Yes    Comment: occ   Drug use: No   Sexual activity: Not on file  Other Topics Concern   Not on file  Social History Narrative   Not on file   Social Determinants of Health   Financial Resource Strain: Low Risk  (11/22/2018)   Overall Financial Resource Strain (CARDIA)    Difficulty of Paying Living Expenses: Not hard at all  Food Insecurity: Unknown (11/22/2018)   Hunger Vital Sign    Worried About Running Out of Food in the Last Year: Patient refused    Ran Out of Food in the Last Year: Patient refused  Transportation Needs: Unknown (11/22/2018)   PRAPARE - Transportation    Lack of Transportation (Medical): Patient refused    Lack of Transportation (Non-Medical): Patient refused   Physical Activity: Unknown (11/22/2018)   Exercise Vital Sign    Days of Exercise per Week: Patient refused    Minutes of Exercise per Session: Patient refused  Stress: No Stress Concern Present (11/22/2018)   [employer] of Occupational Health - Occupational Stress Questionnaire    Feeling of Stress : Not at all  Social Connections: Unknown (11/22/2018)   Social Connection and Isolation Panel [NHANES]    Frequency of Communication with Friends and Family: Patient refused    Frequency of Social Gatherings with Friends and Family: Patient refused    Attends Religious Services: Patient refused    Active Member of Clubs or Organizations: Patient refused    Attends Banker Meetings: Patient refused    Marital Status: Patient refused  Intimate Partner Violence: Unknown (11/22/2018)   Humiliation, Afraid, Rape, and Kick questionnaire    Fear of Current or Ex-Partner: Patient refused    Emotionally Abused: Patient refused    Physically Abused: Patient refused  Sexually Abused: Patient refused    FAMILY HISTORY: Family History  Problem Relation Age of Onset   Cancer Father     ALLERGIES:  is allergic to morphine.  MEDICATIONS:  Current Outpatient Medications  Medication Sig Dispense Refill   acyclovir (ZOVIRAX) 400 MG tablet TAKE 1 TABLET BY MOUTH TWICE A DAY 180 tablet 1   allopurinol (ZYLOPRIM) 300 MG tablet TAKE 1 TABLET BY MOUTH EVERY DAY 90 tablet 1   Ascorbic Acid (VITAMIN C ADULT GUMMIES PO) Take 1 tablet by mouth daily.     aspirin EC 81 MG tablet Take 1 tablet (81 mg total) by mouth daily. Swallow whole. 90 tablet 3   atorvastatin (LIPITOR) 20 MG tablet Take 20 mg by mouth at bedtime.     dexamethasone (DECADRON) 4 MG tablet Take by mouth.     fexofenadine (ALLEGRA) 180 MG tablet Take 180 mg by mouth daily.     furosemide (LASIX) 20 MG tablet Take 20 mg by mouth daily as needed for fluid or edema.     HEMADY 20 MG TABS TAKE 40 MG BY MOUTH ONCE A WEEK. 2  TABLETS ON DAY OF CHEMO TREATMENT. 60 tablet 3   KLOR-CON M20 20 MEQ tablet TAKE 1 TABLET BY MOUTH EVERY DAY 30 tablet 1   levothyroxine (SYNTHROID) 50 MCG tablet Take 50 mcg by mouth daily before breakfast.     lidocaine-prilocaine (EMLA) cream Apply 1 application. topically as needed. 30 g 0   Magnesium Hydroxide (MILK OF MAGNESIA PO) Take by mouth. 2 times a week     ondansetron (ZOFRAN) 8 MG tablet Take 1 tablet (8 mg total) by mouth every 8 (eight) hours as needed. (Patient not taking: Reported on 03/17/2022) 30 tablet 0   OZEMPIC, 0.25 OR 0.5 MG/DOSE, 2 MG/1.5ML SOPN Inject 0.5 mg into the skin every Saturday.     PROAIR HFA 108 (90 BASE) MCG/ACT inhaler Inhale 2 puffs into the lungs every 4 (four) hours as needed for wheezing or shortness of breath. Uses seasonal (Patient not taking: Reported on 03/17/2022)  3   prochlorperazine (COMPAZINE) 10 MG tablet Take 1 tablet (10 mg total) by mouth every 6 (six) hours as needed for nausea or vomiting. (Patient not taking: Reported on 03/17/2022) 30 tablet 0   Sennosides (SENOKOT PO) Take by mouth. Bid     Vitamin D, Ergocalciferol, (DRISDOL) 1.25 MG (50000 UNIT) CAPS capsule Take 50,000 Units by mouth once a week.     No current facility-administered medications for this visit.    REVIEW OF SYSTEMS:   Constitutional: ( - ) fevers, ( - )  chills , ( - ) night sweats Eyes: ( - ) blurriness of vision, ( - ) double vision, ( - ) watery eyes Ears, nose, mouth, throat, and face: ( - ) mucositis, ( - ) sore throat Respiratory: ( - ) cough, ( - ) dyspnea, ( - ) wheezes Cardiovascular: ( - ) palpitation, ( - ) chest discomfort, ( - ) lower extremity swelling Gastrointestinal:  ( - ) nausea, ( - ) heartburn, ( -) change in bowel habits Skin: ( - ) abnormal skin rashes Lymphatics: ( - ) new lymphadenopathy, ( - ) easy bruising Neurological: ( - ) numbness, ( - ) tingling, ( - ) new weaknesses Behavioral/Psych: ( - ) mood change, ( - ) new changes  All  other systems were reviewed with the patient and are negative.  PHYSICAL EXAMINATION: ECOG PERFORMANCE STATUS: 1 - Symptomatic but completely  ambulatory  Vitals:   05/12/22 0826  BP: (!) 100/50  Pulse: 77  Resp: 18  Temp: 97.7 F (36.5 C)  SpO2: 100%     Filed Weights   05/12/22 0826  Weight: 162 lb 1.6 oz (73.5 kg)    GENERAL: Well-appearing middle-age Caucasian female alert, no distress and comfortable SKIN: No evidence of rash, erythema, dry skin, or raised lesions. EYES: conjunctiva are pink and non-injected, sclera clear LUNGS: clear to auscultation and percussion with normal breathing effort HEART: regular rate & rhythm and no murmurs and no lower extremity edema Musculoskeletal: no cyanosis of digits and no clubbing  PSYCH: alert & oriented x 3, fluent speech NEURO: no focal motor/sensory deficits  LABORATORY DATA:  I have reviewed the data as listed    Latest Ref Rng & Units 05/12/2022    7:57 AM 05/05/2022    8:56 AM 04/28/2022    8:17 AM  CBC  WBC 4.0 - 10.5 K/uL 5.0  2.5  2.9   Hemoglobin 12.0 - 15.0 g/dL 16.1  09.6  04.5   Hematocrit 36.0 - 46.0 % 31.9  33.5  31.7   Platelets 150 - 400 K/uL 92  95  153        Latest Ref Rng & Units 05/12/2022    7:57 AM 05/05/2022    8:56 AM 04/28/2022    8:17 AM  CMP  Glucose 70 - 99 mg/dL 409  811  914   BUN 8 - 23 mg/dL 26  20  22    Creatinine 0.44 - 1.00 mg/dL 7.82  9.56  2.13   Sodium 135 - 145 mmol/L 141  142  141   Potassium 3.5 - 5.1 mmol/L 4.0  4.0  3.7   Chloride 98 - 111 mmol/L 109  109  110   CO2 22 - 32 mmol/L 24  26  24    Calcium 8.9 - 10.3 mg/dL 9.4  9.6  9.2   Total Protein 6.5 - 8.1 g/dL 5.5  6.1  5.8   Total Bilirubin 0.3 - 1.2 mg/dL 0.8  1.0  0.7   Alkaline Phos 38 - 126 U/L 57  59  62   AST 15 - 41 U/L 14  15  17    ALT 0 - 44 U/L 12  13  14      Lab Results  Component Value Date   MPROTEIN Not Observed 05/05/2022   MPROTEIN Not Observed 04/07/2022   MPROTEIN Not Observed 03/10/2022   Lab  Results  Component Value Date   KPAFRELGTCHN 3.3 05/05/2022   KPAFRELGTCHN 4.5 04/07/2022   KPAFRELGTCHN 4.4 03/10/2022   LAMBDASER 2.0 (L) 05/05/2022   LAMBDASER 2.2 (L) 04/07/2022   LAMBDASER 1.9 (L) 03/10/2022   KAPLAMBRATIO 1.65 05/05/2022   KAPLAMBRATIO 2.05 (H) 04/07/2022   KAPLAMBRATIO 2.32 (H) 03/10/2022    RADIOGRAPHIC STUDIES: No results found.  ASSESSMENT & PLAN Carol [name] 66 y.o. female with medical history significant for multiple myeloma with plasmacytoma who presents for a follow up visit.   # Free Kappa Multiple Myeloma # Plasmacytoma  -- original finding of L2 plasmacytoma on biopsy of back lesion, bone marrow biopsy confirms greater than 60% plasma cells consistent with multiple myeloma. --Cycle 1 Day 1 of VRD treatment started on 10/26/2021.  --VRD therapy stopped on 11/25/2021 due to rash. Started Dara/Rev/Dex on 12/16/2021.  --will order monthly SPEP, UPEP, SFLC and beta 2 microglobulin --weekly CBC, CMP, and LDH --Cycle 1 Day 1 of Kd started on 01/06/2022  Plan:  --Labs today were reviewed and adequate for treatment today.  --Cycle 5 Day 15 of Kd therapy today.  --upcoming neck surgery on 06/05/2022. Will HOLD chemo on 7/14 and regroup on 06/16/2022 to discuss restarting.  --RTC in 2 weeks with interval weekly treatment.   #Leukopenia/Thrombocytopenia/Anemia --WBC 5.0, levels oscillate. Hgb 11.3, stable. Platelet count 92 --Likely secondary to chemotherapy --continue to monitor.   #Rash--resolved --Present for approximately one week --Received IV solumedrol and medrol dose pak.  --Suspect drug induced (velcade)  #Thrush, resolved: --Treated with Nystatin mouthwash with improvement.   #Metallic taste/weight loss-improving:  --Encouraged to increase caloric intake and supplement with protein shakes.  #Supportive Care -- chemotherapy education complete  -- port placement complete. -- sent prescription for EMLA cream -- zofran 8mg  q8H PRN and  compazine 10mg  PO q6H for nausea -- acyclovir 400mg  PO BID for VCZ prophylaxis -- allopurinol 300mg  PO daily for TLS prophylaxis -- Started zometa on 02/03/2022. Will continue q 3 months.HOLD zometa in June 2023 due to upcoming spine surgery, will restart sometime thereafter.  -- no pain medication required at this time.   No orders of the defined types were placed in this encounter.  All questions were answered. The patient knows to call the clinic with any problems, questions or concerns.  I have spent a total of 30 minutes minutes of face-to-face and non-face-to-face time, preparing to see the patient, performing a medically appropriate examination, counseling and educating the patient, ordering medications/tests, documenting clinical information in the electronic health record, and care coordination.   Ulysees Barns, MD Department of Hematology/Oncology [facility] Phone: [phone] Pager: [phone] Email: [email or web address]  05/16/2022 3:53 PM

## 2022-05-16 ENCOUNTER — Encounter: Payer: Self-pay | Admitting: Hematology and Oncology

## 2022-05-18 ENCOUNTER — Other Ambulatory Visit: Payer: Self-pay | Admitting: Hematology and Oncology

## 2022-05-18 ENCOUNTER — Telehealth: Payer: Self-pay

## 2022-05-18 NOTE — Telephone Encounter (Signed)
Called pt to clarify that she does not have MD and infusion appointment on 6/30. Next lab, MD and infusion apt will be 05/26/22. Sent scheduling message to reschedule nutrition apt to 05/26/22 as well.  Pt voiced understanding.

## 2022-05-19 ENCOUNTER — Telehealth: Payer: Self-pay | Admitting: Hematology and Oncology

## 2022-05-19 ENCOUNTER — Inpatient Hospital Stay: Payer: BC Managed Care – PPO | Admitting: Dietician

## 2022-05-19 ENCOUNTER — Inpatient Hospital Stay: Payer: BC Managed Care – PPO

## 2022-05-19 NOTE — Telephone Encounter (Signed)
.  Called patient to schedule appointment per 6/29 inbasket, patient is aware of date and time.   

## 2022-05-25 ENCOUNTER — Inpatient Hospital Stay: Payer: BC Managed Care – PPO | Attending: Radiation Oncology | Admitting: Dietician

## 2022-05-25 ENCOUNTER — Telehealth (HOSPITAL_COMMUNITY): Payer: Self-pay | Admitting: Dietician

## 2022-05-25 DIAGNOSIS — C9030 Solitary plasmacytoma not having achieved remission: Secondary | ICD-10-CM | POA: Insufficient documentation

## 2022-05-25 DIAGNOSIS — Z79899 Other long term (current) drug therapy: Secondary | ICD-10-CM | POA: Insufficient documentation

## 2022-05-25 DIAGNOSIS — D649 Anemia, unspecified: Secondary | ICD-10-CM | POA: Insufficient documentation

## 2022-05-25 DIAGNOSIS — Z5112 Encounter for antineoplastic immunotherapy: Secondary | ICD-10-CM | POA: Insufficient documentation

## 2022-05-25 DIAGNOSIS — D72819 Decreased white blood cell count, unspecified: Secondary | ICD-10-CM | POA: Insufficient documentation

## 2022-05-25 NOTE — Telephone Encounter (Signed)
Attempted to reach patient via telephone for nutrition follow-up. Patient did not answer. Left message with request for return call. Contact information provided.

## 2022-05-26 ENCOUNTER — Other Ambulatory Visit: Payer: Self-pay

## 2022-05-26 ENCOUNTER — Inpatient Hospital Stay (HOSPITAL_BASED_OUTPATIENT_CLINIC_OR_DEPARTMENT_OTHER): Payer: BC Managed Care – PPO | Admitting: Hematology and Oncology

## 2022-05-26 ENCOUNTER — Other Ambulatory Visit: Payer: Self-pay | Admitting: Hematology and Oncology

## 2022-05-26 ENCOUNTER — Inpatient Hospital Stay: Payer: BC Managed Care – PPO

## 2022-05-26 VITALS — BP 116/60 | HR 76 | Temp 98.3°F | Resp 15 | Wt 161.3 lb

## 2022-05-26 DIAGNOSIS — C9000 Multiple myeloma not having achieved remission: Secondary | ICD-10-CM

## 2022-05-26 DIAGNOSIS — C9030 Solitary plasmacytoma not having achieved remission: Secondary | ICD-10-CM | POA: Diagnosis present

## 2022-05-26 DIAGNOSIS — Z95828 Presence of other vascular implants and grafts: Secondary | ICD-10-CM

## 2022-05-26 DIAGNOSIS — Z79899 Other long term (current) drug therapy: Secondary | ICD-10-CM | POA: Diagnosis not present

## 2022-05-26 DIAGNOSIS — D649 Anemia, unspecified: Secondary | ICD-10-CM | POA: Diagnosis not present

## 2022-05-26 DIAGNOSIS — D72819 Decreased white blood cell count, unspecified: Secondary | ICD-10-CM | POA: Diagnosis not present

## 2022-05-26 DIAGNOSIS — Z5112 Encounter for antineoplastic immunotherapy: Secondary | ICD-10-CM | POA: Diagnosis present

## 2022-05-26 LAB — CBC WITH DIFFERENTIAL (CANCER CENTER ONLY)
Abs Immature Granulocytes: 0.01 10*3/uL (ref 0.00–0.07)
Basophils Absolute: 0 10*3/uL (ref 0.0–0.1)
Basophils Relative: 0 %
Eosinophils Absolute: 0 10*3/uL (ref 0.0–0.5)
Eosinophils Relative: 0 %
HCT: 31.8 % — ABNORMAL LOW (ref 36.0–46.0)
Hemoglobin: 11.2 g/dL — ABNORMAL LOW (ref 12.0–15.0)
Immature Granulocytes: 0 %
Lymphocytes Relative: 12 %
Lymphs Abs: 0.5 10*3/uL — ABNORMAL LOW (ref 0.7–4.0)
MCH: 35 pg — ABNORMAL HIGH (ref 26.0–34.0)
MCHC: 35.2 g/dL (ref 30.0–36.0)
MCV: 99.4 fL (ref 80.0–100.0)
Monocytes Absolute: 0.2 10*3/uL (ref 0.1–1.0)
Monocytes Relative: 4 %
Neutro Abs: 3.2 10*3/uL (ref 1.7–7.7)
Neutrophils Relative %: 84 %
Platelet Count: 213 10*3/uL (ref 150–400)
RBC: 3.2 MIL/uL — ABNORMAL LOW (ref 3.87–5.11)
RDW: 13.2 % (ref 11.5–15.5)
WBC Count: 3.8 10*3/uL — ABNORMAL LOW (ref 4.0–10.5)
nRBC: 0 % (ref 0.0–0.2)

## 2022-05-26 LAB — CMP (CANCER CENTER ONLY)
ALT: 11 U/L (ref 0–44)
AST: 14 U/L — ABNORMAL LOW (ref 15–41)
Albumin: 4 g/dL (ref 3.5–5.0)
Alkaline Phosphatase: 62 U/L (ref 38–126)
Anion gap: 6 (ref 5–15)
BUN: 34 mg/dL — ABNORMAL HIGH (ref 8–23)
CO2: 25 mmol/L (ref 22–32)
Calcium: 9.2 mg/dL (ref 8.9–10.3)
Chloride: 109 mmol/L (ref 98–111)
Creatinine: 0.84 mg/dL (ref 0.44–1.00)
GFR, Estimated: >60 mL/min (ref >60)
Glucose, Bld: 191 mg/dL — ABNORMAL HIGH (ref 70–99)
Potassium: 4 mmol/L (ref 3.5–5.1)
Sodium: 140 mmol/L (ref 135–145)
Total Bilirubin: 0.7 mg/dL (ref 0.3–1.2)
Total Protein: 6 g/dL — ABNORMAL LOW (ref 6.5–8.1)

## 2022-05-26 LAB — LACTATE DEHYDROGENASE: LDH: 194 U/L — ABNORMAL HIGH (ref 98–192)

## 2022-05-26 MED ORDER — HEPARIN SOD (PORK) LOCK FLUSH 100 UNIT/ML IV SOLN
500.0000 [IU] | Freq: Once | INTRAVENOUS | Status: AC | PRN
  Administered 2022-05-26: 500 [IU]

## 2022-05-26 MED ORDER — SODIUM CHLORIDE 0.9% FLUSH
10.0000 mL | INTRAVENOUS | Status: DC | PRN
  Administered 2022-05-26: 10 mL

## 2022-05-26 MED ORDER — SODIUM CHLORIDE 0.9 % IV SOLN: Freq: Once | INTRAVENOUS | Status: AC

## 2022-05-26 MED ORDER — DEXTROSE 5 % IV SOLN
70.0000 mg/m2 | Freq: Once | INTRAVENOUS | Status: AC
  Administered 2022-05-26: 130 mg via INTRAVENOUS
  Filled 2022-05-26: qty 60

## 2022-05-26 MED ORDER — SODIUM CHLORIDE 0.9% FLUSH
10.0000 mL | Freq: Once | INTRAVENOUS | Status: AC
  Administered 2022-05-26: 10 mL

## 2022-05-26 NOTE — Patient Instructions (Signed)
[facility]ICAL [facility]COL[facility]   Discharge Instructions: Thank you for choosing [facility] to provide your oncology and hematology care.   If you have a lab appointment with the Cancer Center, please go directly to the Cancer Center and check in at the registration area.   Wear comfortable clothing and clothing appropriate for easy access to any Portacath or PICC line.   We strive to give you quality time with your provider. You may need to reschedule your appointment if you arrive late (15 or more minutes).  Arriving late affects you and other patients whose appointments are after yours.  Also, if you miss three or more appointments without notifying the office, you may be dismissed from the clinic at the provider's discretion.      For prescription refill requests, have your pharmacy contact our office and allow 72 hours for refills to be completed.    Today you received the following chemotherapy and/or immunotherapy agents: carfilzomib      To help prevent nausea and vomiting after your treatment, we encourage you to take your nausea medication as directed.  BELOW ARE SYMPTOMS THAT SHOULD BE REPORTED IMMEDIATELY: *FEVER GREATER THAN 100.4 F (38 C) OR HIGHER *CHILLS OR SWEATING *NAUSEA AND VOMITING THAT IS NOT CONTROLLED WITH YOUR NAUSEA MEDICATION *UNUSUAL SHORTNESS OF BREATH *UNUSUAL BRUISING OR BLEEDING *URINARY PROBLEMS (pain or burning when urinating, or frequent urination) *BOWEL PROBLEMS (unusual diarrhea, constipation, pain near the anus) TENDERNESS IN MOUTH AND THROAT WITH OR WITHOUT PRESENCE OF ULCERS (sore throat, sores in mouth, or a toothache) UNUSUAL RASH, SWELLING OR PAIN  UNUSUAL VAGINAL DISCHARGE OR ITCHING   Items with * indicate a potential emergency and should be followed up as soon as possible or go to the Emergency Department if any problems should occur.  Please show the CHEMOTHERAPY ALERT CARD or IMMUNOTHERAPY ALERT CARD at check-in  to the Emergency Department and triage nurse.  Should you have questions after your visit or need to cancel or reschedule your appointment, please contact [facility]  Dept: [phone]  and follow the prompts.  Office hours are 8:00 a.m. to 4:30 p.m. Monday - Friday. Please note that voicemails left after 4:00 p.m. may not be returned until the following business day.  We are closed weekends and major holidays. You have access to a nurse at all times for urgent questions. Please call the main number to the clinic Dept: [phone] and follow the prompts.   For any non-urgent questions, you may also contact your provider using MyChart. We now offer e-Visits for anyone 18 and older to request care online for non-urgent symptoms. For details visit [email or web address].   Also download the MyChart app! Go to the app store, search "MyChart", open the app, select [facility], and log in with your MyChart username and password.  Masks are optional in the cancer centers. If you would like for your care team to wear a mask while they are taking care of you, please let them know. For doctor visits, patients may have with them one support person who is at least 67 years old. At this time, visitors are not allowed in the infusion area. 

## 2022-05-26 NOTE — Progress Notes (Signed)
Patient states she took '40mg'$  of dexamethasone prior to arrival

## 2022-05-26 NOTE — Progress Notes (Signed)
[place] [phone]   [phone]  PROGRESS NOTE  Patient Care Team: [name] as PCP - General (Physician Assistant)  Hematological/Oncological History # Plasmacytoma with Multiple Myeloma  07/29/2021: CT cervical spine shows expansile lytic lesion which almost completely replaces the normal C2 vertebral body  08/24/2021: PET CT scan shows increased metabolic activity at C2 and C7  08/25/2021: biopsy of C2 lesion showed finding consistent with plasma cell neoplasm.  09/08/2021: start radiation therapy 09/12/2021: establish care with Dr. Lorenso Courier  09/28/2021: end radiation therapy 10/10/2021: Bone marrow biopsy. Results confirm plasma cell neoplasm consistent with multiple myeloma, kappa restricted.  10/26/2021: Cycle 1 Day 1 of VRD 11/18/2021: Cycle 2 Day 1 of VRD 11/25/2021: Velcade HELD due to full body rash. Resolved with steroid therapy.  12/16/2021: Cycle 1 Day 1 of Dara/Rev/Dex 12/30/2021: HELD Daratumumab due to toxicity. Patient unable to tolerate. Requested holding the medication  01/06/2022: Cycle 1 Day 1 of Kd  02/03/2022: Cycle 2 Day 1 of Kd 03/03/2022: Cycle 3 Day 1 of Kd 03/31/2022: Cycle 4 Day 1 of Kd 04/28/2022: Cycle 5 Day 1 of Kd 05/26/2022: Cycle 6 Day 1 of Kd   Interval History:  Carol [name] 66 y.o. female with medical history significant for multiple myeloma with plasmacytoma who presents for a follow up visit. The patient's last visit was on 05/12/2022. Today is Cycle 6 Day 1 of Kd.   On exam today Carol [name] reports she is looking forward to her upcoming surgery.  She reports she has had no major changes in her health in the interim since her last visit.  She reports that she feels quite good overall and is continue to go to the gym.  Her energy is quite good.  Her appetite is also strong.  She is not having any numbness or tingling of her fingers or toes.  She does sometimes have alternating constipation and diarrhea but  reports that over the last week her bowel movements have been quite normal.  She does have "alternating diarrhea and constipation".  She denies fevers, chills, night sweats, shortness of breath, chest pain or cough. She has no other complaints. Rest of the 10 point ROS is below.  We will put her chemotherapy on pause until 06/16/2022.  At that time we will discuss options moving forward including maintenance, continuation of Kyprolis, and other options.  MEDICAL HISTORY:  Past Medical History:  Diagnosis Date   Allergy    Ankle edema    both ankles   Arthritis    hands,knees,elbows   Cancer ([place]) 2016   tumor kidney   Diabetes mellitus    type 2    Eczema    History of kidney stones    Hypothyroidism    Obesity    Sleep apnea    cpap machine setting of 3   Thyroid disease     SURGICAL HISTORY: Past Surgical History:  Procedure Laterality Date   BREAST BIOPSY Right 2001   CHOLECYSTECTOMY  2009   open   COLONOSCOPY     CYSTOSCOPY W/ RETROGRADES Left 11/22/2018   Procedure: CYSTOSCOPY WITH RETROGRADE PYELOGRAM;  Surgeon: Ardis Hughs, MD;  Location: [facility];  Service: Urology;  Laterality: Left;   CYSTOSCOPY WITH RETROGRADE PYELOGRAM, URETEROSCOPY AND STENT PLACEMENT Right 10/03/2021   Procedure: CYSTOSCOPY WITH RETROGRADE PYELOGRAM, URETEROSCOPY,STONE EXTRACTION AND STENT PLACEMENT;  Surgeon: Ceasar Mons, MD;  Location: [facility];  Service: Urology;  Laterality: Right;   DILATATION &  CURRETTAGE/HYSTEROSCOPY WITH RESECTOCOPE N/A 01/21/2013   Procedure: DILATATION & CURETTAGE/HYSTEROSCOPY WITH RESECTOCOPE AND RESECTION OF ENDOMETRIAL;  Surgeon: Selinda Orion, MD;  Location: [facility];  Service: Gynecology;  Laterality: N/A;   IR IMAGING GUIDED PORT INSERTION  02/01/2022   KNEE ARTHROSCOPY     left    POLYPECTOMY     POSTERIOR CERVICAL FUSION/FORAMINOTOMY N/A 08/25/2021   Procedure: Occiput to Cervical 5 Posterior cervical instrumented fusion with  open biopsy of Cervical 2 Mass. Cervical Two Ganglionectomy;  Surgeon: Judith Part, MD;  Location: [place];  Service: Neurosurgery;  Laterality: N/A;   ROBOT ASSISTED PYELOPLASTY Left 11/22/2018   Procedure: XI ROBOTIC ASSISTED ATTEMPTED PYELOPLASTY CONVERTED TO NEPHRECTOMY/LYSIS OF ADHESIONS/UMBILICAL HERNIA REPAIR;  Surgeon: Ardis Hughs, MD;  Location: [facility];  Service: Urology;  Laterality: Left;   ROBOTIC ASSITED PARTIAL NEPHRECTOMY Left 06/11/2015   Procedure: LEFT ROBOTIC ASSISTED LAPAROSCOPIC  PARTIAL NEPHRECTOMY;  Surgeon: Ardis Hughs, MD;  Location: [facility];  Service: Urology;  Laterality: Left;   ROTATOR CUFF REPAIR Left 2011   URETERAL BIOPSY Left 02/14/2017   Procedure: URETERAL BIOPSY;  Surgeon: Ardis Hughs, MD;  Location: [facility];  Service: Urology;  Laterality: Left;   URETEROSCOPY WITH HOLMIUM LASER LITHOTRIPSY Left 02/14/2017   Procedure: URETEROSCOPY LITHOTRIPSY, URETERAL BALLOON DILATION;  Surgeon: Ardis Hughs, MD;  Location: [facility];  Service: Urology;  Laterality: Left;  With STENT    SOCIAL HISTORY: Social History   Socioeconomic History   Marital status: Married    Spouse name: Not on file   Number of children: Not on file   Years of education: Not on file   Highest education level: Not on file  Occupational History   Not on file  Tobacco Use   Smoking status: Never   Smokeless tobacco: Never  Vaping Use   Vaping Use: Never used  Substance and Sexual Activity   Alcohol use: Yes    Comment: occ   Drug use: No   Sexual activity: Not on file  Other Topics Concern   Not on file  Social History Narrative   Not on file   Social Determinants of Health   Financial Resource Strain: Low Risk  (11/22/2018)   Overall Financial Resource Strain (CARDIA)    Difficulty of Paying Living Expenses: Not hard at all  Food Insecurity: Unknown (11/22/2018)   Hunger Vital Sign    Worried About Running Out of Food in the Last Year: Patient refused     [place] in the Last Year: Patient refused  Transportation Needs: Unknown (11/22/2018)   PRAPARE - Transportation    Lack of Transportation (Medical): Patient refused    Lack of Transportation (Non-Medical): Patient refused  Physical Activity: Unknown (11/22/2018)   Exercise Vital Sign    Days of Exercise per Week: Patient refused    Minutes of Exercise per Session: Patient refused  Stress: No Stress Concern Present (11/22/2018)   [employer] of Stress : Not at all  Social Connections: Unknown (11/22/2018)   Social Connection and Isolation Panel [NHANES]    Frequency of Communication with Friends and Family: Patient refused    Frequency of Social Gatherings with Friends and Family: Patient refused    Attends Religious Services: Patient refused    Active Member of Clubs or Organizations: Patient refused    Attends Archivist Meetings: Patient refused    Marital Status:  Patient refused  Intimate Partner Violence: Unknown (11/22/2018)   Humiliation, Afraid, Rape, and Kick questionnaire    Fear of Current or Ex-Partner: Patient refused    Emotionally Abused: Patient refused    Physically Abused: Patient refused    Sexually Abused: Patient refused    FAMILY HISTORY: Family History  Problem Relation Age of Onset   Cancer Father     ALLERGIES:  is allergic to morphine.  MEDICATIONS:  Current Outpatient Medications  Medication Sig Dispense Refill   acyclovir (ZOVIRAX) 400 MG tablet TAKE 1 TABLET BY MOUTH TWICE A DAY 180 tablet 1   allopurinol (ZYLOPRIM) 300 MG tablet TAKE 1 TABLET BY MOUTH EVERY DAY 90 tablet 1   Ascorbic Acid (VITAMIN C ADULT GUMMIES PO) Take 1 tablet by mouth daily.     aspirin EC 81 MG tablet Take 1 tablet (81 mg total) by mouth daily. Swallow whole. 90 tablet 3   atorvastatin (LIPITOR) 20 MG tablet Take 20 mg by mouth at bedtime.     dexamethasone (DECADRON) 4 MG tablet Take 40  mg by mouth every Friday.     furosemide (LASIX) 20 MG tablet Take 20 mg by mouth daily.     HEMADY 20 MG TABS TAKE 40 MG BY MOUTH ONCE A WEEK. 2 TABLETS ON DAY OF CHEMO TREATMENT. (Patient not taking: Reported on 05/24/2022) 60 tablet 3   KLOR-CON M20 20 MEQ tablet TAKE 1 TABLET BY MOUTH EVERY DAY 30 tablet 1   levothyroxine (SYNTHROID) 50 MCG tablet Take 50 mcg by mouth daily before breakfast.     lidocaine-prilocaine (EMLA) cream Apply 1 application. topically as needed. 30 g 0   Magnesium Hydroxide (MILK OF MAGNESIA PO) Take 15 mLs by mouth daily as needed (constipation/diarrhea).     ondansetron (ZOFRAN) 8 MG tablet Take 1 tablet (8 mg total) by mouth every 8 (eight) hours as needed. 30 tablet 0   OZEMPIC, 0.25 OR 0.5 MG/DOSE, 2 MG/1.5ML SOPN Inject 0.5 mg into the skin every Saturday.     PROAIR HFA 108 (90 BASE) MCG/ACT inhaler Inhale 2 puffs into the lungs every 4 (four) hours as needed for wheezing or shortness of breath. Uses seasonal  3   prochlorperazine (COMPAZINE) 10 MG tablet Take 1 tablet (10 mg total) by mouth every 6 (six) hours as needed for nausea or vomiting. 30 tablet 0   Sennosides (SENOKOT PO) Take 1 tablet by mouth daily as needed (constipation). Bid     vitamin B-12 (CYANOCOBALAMIN) 500 MCG tablet Take 500 mcg by mouth daily.     Vitamin D, Ergocalciferol, (DRISDOL) 1.25 MG (50000 UNIT) CAPS capsule Take 50,000 Units by mouth once a week.     No current facility-administered medications for this visit.    REVIEW OF SYSTEMS:   Constitutional: ( - ) fevers, ( - )  chills , ( - ) night sweats Eyes: ( - ) blurriness of vision, ( - ) double vision, ( - ) watery eyes Ears, nose, mouth, throat, and face: ( - ) mucositis, ( - ) sore throat Respiratory: ( - ) cough, ( - ) dyspnea, ( - ) wheezes Cardiovascular: ( - ) palpitation, ( - ) chest discomfort, ( - ) lower extremity swelling Gastrointestinal:  ( - ) nausea, ( - ) heartburn, ( -) change in bowel habits Skin: ( - )  abnormal skin rashes Lymphatics: ( - ) new lymphadenopathy, ( - ) easy bruising Neurological: ( - ) numbness, ( - ) tingling, ( - )  new weaknesses Behavioral/Psych: ( - ) mood change, ( - ) new changes  All other systems were reviewed with the patient and are negative.  PHYSICAL EXAMINATION: ECOG PERFORMANCE STATUS: 1 - Symptomatic but completely ambulatory  Vitals:   05/26/22 0837  BP: 116/60  Pulse: 76  Resp: 15  Temp: 98.3 F (36.8 C)  SpO2: 100%     Filed Weights   05/26/22 0837  Weight: 161 lb 4.8 oz (73.2 kg)    GENERAL: Well-appearing middle-age Caucasian female alert, no distress and comfortable SKIN: No evidence of rash, erythema, dry skin, or raised lesions. EYES: conjunctiva are pink and non-injected, sclera clear LUNGS: clear to auscultation and percussion with normal breathing effort HEART: regular rate & rhythm and no murmurs and no lower extremity edema Musculoskeletal: no cyanosis of digits and no clubbing  PSYCH: alert & oriented x 3, fluent speech NEURO: no focal motor/sensory deficits  LABORATORY DATA:  I have reviewed the data as listed    Latest Ref Rng & Units 05/26/2022    8:06 AM 05/12/2022    7:57 AM 05/05/2022    8:56 AM  CBC  WBC 4.0 - 10.5 K/uL 3.8  5.0  2.5   Hemoglobin 12.0 - 15.0 g/dL 11.2  11.3  11.8   Hematocrit 36.0 - 46.0 % 31.8  31.9  33.5   Platelets 150 - 400 K/uL 213  92  95        Latest Ref Rng & Units 05/26/2022    8:06 AM 05/12/2022    7:57 AM 05/05/2022    8:56 AM  CMP  Glucose 70 - 99 mg/dL 191  216  171   BUN 8 - 23 mg/dL 34  26  20   Creatinine 0.44 - 1.00 mg/dL 0.84  0.99  1.07   Sodium 135 - 145 mmol/L 140  141  142   Potassium 3.5 - 5.1 mmol/L 4.0  4.0  4.0   Chloride 98 - 111 mmol/L 109  109  109   CO2 22 - 32 mmol/L _0 Calcium 8.9 - 10.3 mg/dL 9.2  9.4  9.6   Total Protein 6.5 - 8.1 g/dL 6.0  5.5  6.1   Total Bilirubin 0.3 - 1.2 mg/dL 0.7  0.8  1.0   Alkaline Phos 38 - 126 U/L 62  57  59   AST 15 -  41 U/L _1 ALT 0 - 44 U/L _2 Lab Results  Component Value Date   MPROTEIN Not Observed 05/05/2022   MPROTEIN Not Observed 04/07/2022   MPROTEIN Not Observed 03/10/2022   Lab Results  Component Value Date   KPAFRELGTCHN 3.3 05/05/2022   KPAFRELGTCHN 4.5 04/07/2022   KPAFRELGTCHN 4.4 03/10/2022   LAMBDASER 2.0 (L) 05/05/2022   LAMBDASER 2.2 (L) 04/07/2022   LAMBDASER 1.9 (L) 03/10/2022   KAPLAMBRATIO 1.65 05/05/2022   KAPLAMBRATIO 2.05 (H) 04/07/2022   KAPLAMBRATIO 2.32 (H) 03/10/2022    RADIOGRAPHIC STUDIES: No results found.  ASSESSMENT & PLAN Carol [name] 66 y.o. female with medical history significant for multiple myeloma with plasmacytoma who presents for a follow up visit.   # Free Kappa Multiple Myeloma # Plasmacytoma  -- original finding of L2 plasmacytoma on biopsy of back lesion, bone marrow biopsy confirms greater than 60% plasma cells consistent with multiple myeloma. --Cycle 1 Day 1 of VRD treatment started on 10/26/2021.  --VRD therapy  stopped on 11/25/2021 due to rash. Started Dara/Rev/Dex on 12/16/2021.  --will order monthly SPEP, UPEP, SFLC and beta 2 microglobulin --weekly CBC, CMP, and LDH --Cycle 1 Day 1 of Kd started on 01/06/2022 Plan:  --Labs today were reviewed and adequate for treatment today.  --Cycle 6 Day 1 of Kd therapy today.  --upcoming neck surgery on 06/05/2022. Will HOLD chemo on 7/14 and regroup on 06/16/2022 to discuss restarting.  --RTC in 2 weeks with interval weekly treatment.   #Leukopenia/Thrombocytopenia/Anemia --WBC 3.8, levels oscillate. Hgb 11.2, stable. Platelet count 213 --Likely secondary to chemotherapy --continue to monitor.   #Rash--resolved --Present for approximately one week --Received IV solumedrol and medrol dose pak.  --Suspect drug induced (velcade)  #Thrush, resolved: --Treated with Nystatin mouthwash with improvement.   #Metallic taste/weight loss-improving:  --Encouraged to increase  caloric intake and supplement with protein shakes.  #Supportive Care -- chemotherapy education complete  -- port placement complete. -- sent prescription for EMLA cream -- zofran 39m q8H PRN and compazine 151mPO q6H for nausea -- acyclovir 40033mO BID for VCZ prophylaxis -- allopurinol 300m31m daily for TLS prophylaxis -- Started zometa on 02/03/2022. Will continue q 3 months.HOLD zometa in June 2023 due to upcoming spine surgery, will restart sometime thereafter.  -- no pain medication required at this time.   No orders of the defined types were placed in this encounter.  All questions were answered. The patient knows to call the clinic with any problems, questions or concerns.  I have spent a total of 30 minutes minutes of face-to-face and non-face-to-face time, preparing to see the patient, performing a medically appropriate examination, counseling and educating the patient, ordering medications/tests, documenting clinical information in the electronic health record, and care coordination.   JohnLedell Peoples Department of Hematology/Oncology [facility]: [phone]: [phone]: [email or web address]  05/26/2022 9:10 AM

## 2022-05-29 NOTE — Pre-Procedure Instructions (Signed)
Surgical Instructions    Your procedure is scheduled on Monday 06/05/22.   Report to [facility] Main Entrance "A" at 05:30 A.M., then check in with the Admitting office.  Call this number if you have problems the morning of surgery:  [phone]   If you have any questions prior to your surgery date call [phone]: Open Monday-Friday 8am-4pm    Remember:  Do not eat after midnight the night before your surgery  You may drink clear liquids until 04:30 A.M. the morning of your surgery.   Clear liquids allowed are: Water, Non-Citrus Juices (without pulp), Carbonated Beverages, Clear Tea, Black Coffee ONLY (NO MILK, CREAM OR POWDERED CREAMER of any kind), and Gatorade    Take these medicines the morning of surgery with A SIP OF WATER:   acyclovir (ZOVIRAX)   allopurinol (ZYLOPRIM)  KLOR-CON   levothyroxine (SYNTHROID)   Take these medicines if needed:   ondansetron (ZOFRAN)   PROAIR HFA 108 (90 BASE) MCG/ACT inhaler  prochlorperazine (COMPAZINE)   As of today, STOP taking any Aspirin (unless otherwise instructed by your surgeon) Aleve, Naproxen, Ibuprofen, Motrin, Advil, Goody's, BC's, all herbal medications, fish oil, and all vitamins.  WHAT DO I DO ABOUT MY DIABETES MEDICATION?   Do not take oral diabetes medicines (pills) the morning of surgery.  DO NOT TAKE OZEMPIC, the morning of surgery.   The day of surgery, do not take other diabetes injectables, including Byetta (exenatide), Bydureon (exenatide ER), Victoza (liraglutide), or Trulicity (dulaglutide).  HOW TO MANAGE YOUR DIABETES BEFORE AND AFTER SURGERY  Why is it important to control my blood sugar before and after surgery? Improving blood sugar levels before and after surgery helps healing and can limit problems. A way of improving blood sugar control is eating a healthy diet by:  Eating less sugar and carbohydrates  Increasing activity/exercise  Talking with your doctor about reaching your blood sugar  goals High blood sugars (greater than 180 mg/dL) can raise your risk of infections and slow your recovery, so you will need to focus on controlling your diabetes during the weeks before surgery. Make sure that the doctor who takes care of your diabetes knows about your planned surgery including the date and location.  How do I manage my blood sugar before surgery? Check your blood sugar at least 4 times a day, starting 2 days before surgery, to make sure that the level is not too high or low.  Check your blood sugar the morning of your surgery when you wake up and every 2 hours until you get to the Short Stay unit.  If your blood sugar is less than 70 mg/dL, you will need to treat for low blood sugar: Do not take insulin. Treat a low blood sugar (less than 70 mg/dL) with  cup of clear juice (cranberry or apple), 4 glucose tablets, OR glucose gel. Recheck blood sugar in 15 minutes after treatment (to make sure it is greater than 70 mg/dL). If your blood sugar is not greater than 70 mg/dL on recheck, call [phone] for further instructions. Report your blood sugar to the short stay nurse when you get to Short Stay.  If you are admitted to the hospital after surgery: Your blood sugar will be checked by the staff and you will probably be given insulin after surgery (instead of oral diabetes medicines) to make sure you have good blood sugar levels. The goal for blood sugar control after surgery is 80-180 mg/dL.  Do not wear jewelry or makeup Do not wear lotions, powders, perfumes/colognes, or deodorant. Do not shave 48 hours prior to surgery.  Men may shave face and neck. Do not bring valuables to the hospital. Do not wear nail polish, gel polish, artificial nails, or any other type of covering on natural nails (fingers and toes) If you have artificial nails or gel coating that need to be removed by a nail salon, please have this removed prior to surgery. Artificial nails or gel  coating may interfere with anesthesia's ability to adequately monitor your vital signs.  [facility] is not responsible for any belongings or valuables. .   Do NOT Smoke (Tobacco/Vaping)  24 hours prior to your procedure  If you use a CPAP at night, you may bring your mask for your overnight stay.   Contacts, glasses, hearing aids, dentures or partials may not be worn into surgery, please bring cases for these belongings   For patients admitted to the hospital, discharge time will be determined by your treatment team.   Patients discharged the day of surgery will not be allowed to drive home, and someone needs to stay with them for 24 hours.   SURGICAL WAITING ROOM VISITATION Patients having surgery or a procedure in a hospital may have two support people. Children under the age of 30 must have an adult with them who is not the patient. They may stay in the waiting area during the procedure and may switch out with other visitors. If the patient needs to stay at the hospital during part of their recovery, the visitor guidelines for inpatient rooms apply.  Please refer to the [facility] website for the visitor guidelines for Inpatients (after your surgery is over and you are in a regular room).       Special instructions:    Oral Hygiene is also important to reduce your risk of infection.  Remember - BRUSH YOUR TEETH THE MORNING OF SURGERY WITH YOUR REGULAR TOOTHPASTE   [place]- Preparing For Surgery  Before surgery, you can play an important role. Because skin is not sterile, your skin needs to be as free of germs as possible. You can reduce the number of germs on your skin by washing with CHG (chlorahexidine gluconate) Soap before surgery.  CHG is an antiseptic cleaner which kills germs and bonds with the skin to continue killing germs even after washing.     Please do not use if you have an allergy to CHG or antibacterial soaps. If your skin becomes reddened/irritated stop  using the CHG.  Do not shave (including legs and underarms) for at least 48 hours prior to first CHG shower. It is OK to shave your face.  Please follow these instructions carefully.     Shower the NIGHT BEFORE SURGERY and the MORNING OF SURGERY with CHG Soap.   If you chose to wash your hair, wash your hair first as usual with your normal shampoo. After you shampoo, rinse your hair and body thoroughly to remove the shampoo.  Then [employer] and genitals (private parts) with your normal soap and rinse thoroughly to remove soap.  After that Use CHG Soap as you would any other liquid soap. You can apply CHG directly to the skin and wash gently with a scrungie or a clean washcloth.   Apply the CHG Soap to your body ONLY FROM THE NECK DOWN.  Do not use on open wounds or open sores. Avoid contact with your eyes, ears, mouth and  genitals (private parts). Wash Face and genitals (private parts)  with your normal soap.   Wash thoroughly, paying special attention to the area where your surgery will be performed.  Thoroughly rinse your body with warm water from the neck down.  DO NOT shower/wash with your normal soap after using and rinsing off the CHG Soap.  Pat yourself dry with a CLEAN TOWEL.  Wear CLEAN PAJAMAS to bed the night before surgery  Place CLEAN SHEETS on your bed the night before your surgery  DO NOT SLEEP WITH PETS.   Day of Surgery:  Take a shower with CHG soap. Wear Clean/Comfortable clothing the morning of surgery Do not apply any deodorants/lotions.   Remember to brush your teeth WITH YOUR REGULAR TOOTHPASTE.    If you received a COVID test during your pre-op visit, it is requested that you wear a mask when out in public, stay away from anyone that may not be feeling well, and notify your surgeon if you develop symptoms. If you have been in contact with anyone that has tested positive in the last 10 days, please notify your surgeon.    Please read over the following  fact sheets that you were given.

## 2022-05-30 ENCOUNTER — Other Ambulatory Visit: Payer: Self-pay

## 2022-05-30 ENCOUNTER — Encounter (HOSPITAL_COMMUNITY)
Admission: RE | Admit: 2022-05-30 | Discharge: 2022-05-30 | Disposition: A | Discharge status: Home / Self Care | Payer: BC Managed Care – PPO | Source: Ambulatory Visit | Attending: Neurological Surgery | Admitting: Neurological Surgery

## 2022-05-30 ENCOUNTER — Encounter (HOSPITAL_COMMUNITY): Payer: Self-pay

## 2022-05-30 VITALS — BP 121/52 | HR 74 | Temp 98.0°F | Resp 17 | Ht 62.0 in | Wt 164.7 lb

## 2022-05-30 DIAGNOSIS — E119 Type 2 diabetes mellitus without complications: Secondary | ICD-10-CM | POA: Diagnosis not present

## 2022-05-30 DIAGNOSIS — Z01812 Encounter for preprocedural laboratory examination: Secondary | ICD-10-CM | POA: Diagnosis not present

## 2022-05-30 DIAGNOSIS — Z01818 Encounter for other preprocedural examination: Secondary | ICD-10-CM

## 2022-05-30 HISTORY — DX: Anemia, unspecified: D64.9

## 2022-05-30 LAB — SURGICAL PCR SCREEN
MRSA, PCR: NEGATIVE
Staphylococcus aureus: NEGATIVE

## 2022-05-30 LAB — GLUCOSE, CAPILLARY: Glucose-Capillary: 143 mg/dL — ABNORMAL HIGH (ref 70–99)

## 2022-05-30 LAB — HEMOGLOBIN A1C
Hgb A1c MFr Bld: 4.3 % — ABNORMAL LOW (ref 4.8–5.6)
Mean Plasma Glucose: 76.71 mg/dL

## 2022-05-30 NOTE — Progress Notes (Signed)
PCP - Zebedee Iba, PA Cardiologist - Dr. Debara Pickett  PPM/ICD - n/a Device Orders - n/a Rep Notified - n/a  Chest x-ray - n/a EKG - 08/25/21 Stress Test - denies ECHO - denies Cardiac Cath - denies  Sleep Study - +OSA CPAP - Does not use. Patient states she has not needed CPAP since weight loss.   CBG today- 143. Patient states she had a doughnut earlier.  Checks Blood Sugar once a week CBG is normally around 110 per patient  Blood Thinner Instructions: n/a Aspirin Instructions: As of today stop taking Aspirin unless otherwise instructed by your surgeon.   ERAS Protcol - Clear liquids until 0430 am day of surgery PRE-SURGERY Ensure or G2- None ordered  COVID TEST- n/a   Anesthesia review: No  Patient denies shortness of breath, fever, cough and chest pain at PAT appointment   All instructions explained to the patient, with a verbal understanding of the material. Patient agrees to go over the instructions while at home for a better understanding. The opportunity to ask questions was provided.

## 2022-06-02 ENCOUNTER — Other Ambulatory Visit: Payer: BC Managed Care – PPO

## 2022-06-02 ENCOUNTER — Ambulatory Visit: Payer: BC Managed Care – PPO

## 2022-06-04 NOTE — Anesthesia Preprocedure Evaluation (Addendum)
Anesthesia Evaluation  Patient identified by MRN, date of birth, ID band Patient awake    Reviewed: Allergy & Precautions, H&P , NPO status , Patient's Chart, lab work & pertinent test results  Airway Mallampati: III  TM Distance: >3 FB Neck ROM: Limited    Dental no notable dental hx. (+) Teeth Intact, Dental Advisory Given   Pulmonary sleep apnea and Continuous Positive Airway Pressure Ventilation ,    Pulmonary exam normal breath sounds clear to auscultation       Cardiovascular Exercise Tolerance: Good negative cardio ROS   Rhythm:Regular Rate:Normal     Neuro/Psych negative neurological ROS  negative psych ROS   GI/Hepatic negative GI ROS, Neg liver ROS,   Endo/Other  diabetes, Type 2Hypothyroidism   Renal/GU Renal InsufficiencyRenal disease  negative genitourinary   Musculoskeletal  (+) Arthritis , Osteoarthritis,    Abdominal   Peds  Hematology  (+) Blood dyscrasia, anemia ,   Anesthesia Other Findings   Reproductive/Obstetrics negative OB ROS                            Anesthesia Physical Anesthesia Plan  ASA: 3  Anesthesia Plan: General   Post-op Pain Management: Tylenol PO (pre-op)*   Induction: Intravenous  PONV Risk Score and Plan: 4 or greater and Ondansetron, Dexamethasone and Midazolam  Airway Management Planned: Oral ETT and Video Laryngoscope Planned  Additional Equipment:   Intra-op Plan:   Post-operative Plan: Extubation in OR  Informed Consent: I have reviewed the patients History and Physical, chart, labs and discussed the procedure including the risks, benefits and alternatives for the proposed anesthesia with the patient or authorized representative who has indicated his/her understanding and acceptance.     Dental advisory given  Plan Discussed with: CRNA  Anesthesia Plan Comments:        Anesthesia Quick Evaluation

## 2022-06-05 ENCOUNTER — Other Ambulatory Visit: Payer: Self-pay

## 2022-06-05 ENCOUNTER — Ambulatory Visit (HOSPITAL_COMMUNITY)
Admission: RE | Admit: 2022-06-05 | Discharge: 2022-06-05 | Disposition: A | Discharge status: Home / Self Care | Payer: BC Managed Care – PPO | Source: Ambulatory Visit | Attending: Neurological Surgery | Admitting: Neurological Surgery

## 2022-06-05 ENCOUNTER — Ambulatory Visit (HOSPITAL_COMMUNITY): Payer: BC Managed Care – PPO | Admitting: Anesthesiology

## 2022-06-05 ENCOUNTER — Encounter (HOSPITAL_COMMUNITY): Payer: Self-pay | Admitting: Neurological Surgery

## 2022-06-05 ENCOUNTER — Ambulatory Visit (HOSPITAL_COMMUNITY): Payer: BC Managed Care – PPO

## 2022-06-05 ENCOUNTER — Encounter (HOSPITAL_COMMUNITY)
Admission: RE | Disposition: A | Discharge status: Home / Self Care | Payer: Self-pay | Source: Ambulatory Visit | Attending: Neurological Surgery

## 2022-06-05 DIAGNOSIS — Z472 Encounter for removal of internal fixation device: Secondary | ICD-10-CM | POA: Insufficient documentation

## 2022-06-05 DIAGNOSIS — Z87311 Personal history of (healed) other pathological fracture: Secondary | ICD-10-CM | POA: Insufficient documentation

## 2022-06-05 DIAGNOSIS — C9000 Multiple myeloma not having achieved remission: Secondary | ICD-10-CM | POA: Insufficient documentation

## 2022-06-05 DIAGNOSIS — E119 Type 2 diabetes mellitus without complications: Secondary | ICD-10-CM | POA: Insufficient documentation

## 2022-06-05 DIAGNOSIS — G4730 Sleep apnea, unspecified: Secondary | ICD-10-CM | POA: Diagnosis not present

## 2022-06-05 HISTORY — PX: HARDWARE REMOVAL: SHX979

## 2022-06-05 LAB — GLUCOSE, CAPILLARY
Glucose-Capillary: 140 mg/dL — ABNORMAL HIGH (ref 70–99)
Glucose-Capillary: 161 mg/dL — ABNORMAL HIGH (ref 70–99)

## 2022-06-05 SURGERY — REMOVAL, HARDWARE
Anesthesia: General | Site: Spine Cervical

## 2022-06-05 MED ORDER — OXYCODONE-ACETAMINOPHEN 5-325 MG PO TABS: 1.0000 | ORAL_TABLET | ORAL | 0 refills | Status: AC | PRN

## 2022-06-05 MED ORDER — DEXAMETHASONE SODIUM PHOSPHATE 10 MG/ML IJ SOLN
INTRAMUSCULAR | Status: AC
Start: 2022-06-05 — End: ?
  Filled 2022-06-05: qty 1

## 2022-06-05 MED ORDER — PROPOFOL 10 MG/ML IV BOLUS
INTRAVENOUS | Status: AC
  Filled 2022-06-05: qty 20

## 2022-06-05 MED ORDER — LIDOCAINE 2% (20 MG/ML) 5 ML SYRINGE
INTRAMUSCULAR | Status: DC | PRN
  Administered 2022-06-05: 60 mg via INTRAVENOUS

## 2022-06-05 MED ORDER — CHLORHEXIDINE GLUCONATE 0.12 % MT SOLN: 15.0000 mL | Freq: Once | OROMUCOSAL | Status: AC

## 2022-06-05 MED ORDER — INSULIN ASPART 100 UNIT/ML IJ SOLN
0.0000 [IU] | INTRAMUSCULAR | Status: DC | PRN
  Filled 2022-06-05: qty 1

## 2022-06-05 MED ORDER — AMISULPRIDE (ANTIEMETIC) 5 MG/2ML IV SOLN
10.0000 mg | Freq: Once | INTRAVENOUS | Status: AC
  Administered 2022-06-05: 10 mg via INTRAVENOUS

## 2022-06-05 MED ORDER — LIDOCAINE-EPINEPHRINE 1 %-1:100000 IJ SOLN
INTRAMUSCULAR | Status: DC | PRN
  Administered 2022-06-05: 10 mL

## 2022-06-05 MED ORDER — ONDANSETRON HCL 4 MG/2ML IJ SOLN
INTRAMUSCULAR | Status: DC | PRN
  Administered 2022-06-05 (×2): 4 mg via INTRAVENOUS

## 2022-06-05 MED ORDER — MIDAZOLAM HCL 2 MG/2ML IJ SOLN
INTRAMUSCULAR | Status: AC
  Filled 2022-06-05: qty 2

## 2022-06-05 MED ORDER — FENTANYL CITRATE (PF) 100 MCG/2ML IJ SOLN
INTRAMUSCULAR | Status: DC | PRN
  Administered 2022-06-05 (×2): 25 ug via INTRAVENOUS
  Administered 2022-06-05 (×2): 50 ug via INTRAVENOUS

## 2022-06-05 MED ORDER — ACETAMINOPHEN 500 MG PO TABS
ORAL_TABLET | ORAL | Status: AC
  Administered 2022-06-05: 1000 mg via ORAL
  Filled 2022-06-05: qty 2

## 2022-06-05 MED ORDER — HYDROMORPHONE HCL 1 MG/ML IJ SOLN: 0.2500 mg | INTRAMUSCULAR | Status: DC | PRN

## 2022-06-05 MED ORDER — ONDANSETRON HCL 4 MG/2ML IJ SOLN
INTRAMUSCULAR | Status: AC
Start: 2022-06-05 — End: ?
  Filled 2022-06-05: qty 2

## 2022-06-05 MED ORDER — THROMBIN 5000 UNITS EX SOLR: OROMUCOSAL | Status: DC | PRN

## 2022-06-05 MED ORDER — LIDOCAINE-EPINEPHRINE 1 %-1:100000 IJ SOLN
INTRAMUSCULAR | Status: AC
  Filled 2022-06-05: qty 1

## 2022-06-05 MED ORDER — LACTATED RINGERS IV SOLN: INTRAVENOUS | Status: DC | PRN

## 2022-06-05 MED ORDER — FENTANYL CITRATE (PF) 250 MCG/5ML IJ SOLN
INTRAMUSCULAR | Status: AC
  Filled 2022-06-05: qty 5

## 2022-06-05 MED ORDER — DIPHENHYDRAMINE HCL 50 MG/ML IJ SOLN
INTRAMUSCULAR | Status: AC
  Filled 2022-06-05: qty 1

## 2022-06-05 MED ORDER — MIDAZOLAM HCL 5 MG/5ML IJ SOLN
INTRAMUSCULAR | Status: DC | PRN
  Administered 2022-06-05: 2 mg via INTRAVENOUS

## 2022-06-05 MED ORDER — CEFAZOLIN SODIUM-DEXTROSE 2-4 GM/100ML-% IV SOLN
2.0000 g | INTRAVENOUS | Status: AC
  Administered 2022-06-05: 2 g via INTRAVENOUS

## 2022-06-05 MED ORDER — 0.9 % SODIUM CHLORIDE (POUR BTL) OPTIME
TOPICAL | Status: DC | PRN
  Administered 2022-06-05: 1000 mL

## 2022-06-05 MED ORDER — ONDANSETRON HCL 4 MG/2ML IJ SOLN
INTRAMUSCULAR | Status: AC
  Filled 2022-06-05: qty 2

## 2022-06-05 MED ORDER — ESMOLOL HCL 100 MG/10ML IV SOLN
INTRAVENOUS | Status: AC
  Filled 2022-06-05: qty 10

## 2022-06-05 MED ORDER — CEFAZOLIN SODIUM-DEXTROSE 2-4 GM/100ML-% IV SOLN
INTRAVENOUS | Status: AC
  Filled 2022-06-05: qty 100

## 2022-06-05 MED ORDER — CHLORHEXIDINE GLUCONATE 0.12 % MT SOLN
OROMUCOSAL | Status: AC
  Administered 2022-06-05: 15 mL via OROMUCOSAL
  Filled 2022-06-05: qty 15

## 2022-06-05 MED ORDER — ORAL CARE MOUTH RINSE: 15.0000 mL | Freq: Once | OROMUCOSAL | Status: AC

## 2022-06-05 MED ORDER — ACETAMINOPHEN 500 MG PO TABS: 1000.0000 mg | ORAL_TABLET | Freq: Once | ORAL | Status: AC

## 2022-06-05 MED ORDER — CHLORHEXIDINE GLUCONATE CLOTH 2 % EX PADS: 6.0000 | MEDICATED_PAD | Freq: Once | CUTANEOUS | Status: DC

## 2022-06-05 MED ORDER — DIPHENHYDRAMINE HCL 50 MG/ML IJ SOLN
INTRAMUSCULAR | Status: DC | PRN
  Administered 2022-06-05: 6.25 mg via INTRAVENOUS

## 2022-06-05 MED ORDER — ROCURONIUM BROMIDE 100 MG/10ML IV SOLN
INTRAVENOUS | Status: DC | PRN
  Administered 2022-06-05: 50 mg via INTRAVENOUS

## 2022-06-05 MED ORDER — ROCURONIUM BROMIDE 10 MG/ML (PF) SYRINGE
PREFILLED_SYRINGE | INTRAVENOUS | Status: AC
Start: 2022-06-05 — End: ?
  Filled 2022-06-05: qty 10

## 2022-06-05 MED ORDER — AMISULPRIDE (ANTIEMETIC) 5 MG/2ML IV SOLN
INTRAVENOUS | Status: AC
  Filled 2022-06-05: qty 4

## 2022-06-05 MED ORDER — THROMBIN 5000 UNITS EX SOLR
CUTANEOUS | Status: AC
Start: 2022-06-05 — End: ?
  Filled 2022-06-05: qty 5000

## 2022-06-05 MED ORDER — DEXAMETHASONE SODIUM PHOSPHATE 10 MG/ML IJ SOLN
INTRAMUSCULAR | Status: DC | PRN
  Administered 2022-06-05: 10 mg via INTRAVENOUS

## 2022-06-05 MED ORDER — SUGAMMADEX SODIUM 200 MG/2ML IV SOLN
INTRAVENOUS | Status: DC | PRN
  Administered 2022-06-05: 150 mg via INTRAVENOUS

## 2022-06-05 MED ORDER — PROPOFOL 10 MG/ML IV BOLUS
INTRAVENOUS | Status: DC | PRN
  Administered 2022-06-05: 120 mg via INTRAVENOUS
  Administered 2022-06-05: 30 mg via INTRAVENOUS

## 2022-06-05 MED ORDER — LACTATED RINGERS IV SOLN: INTRAVENOUS | Status: DC

## 2022-06-05 MED ORDER — PHENYLEPHRINE 80 MCG/ML (10ML) SYRINGE FOR IV PUSH (FOR BLOOD PRESSURE SUPPORT)
PREFILLED_SYRINGE | INTRAVENOUS | Status: DC | PRN
  Administered 2022-06-05 (×2): 80 ug via INTRAVENOUS

## 2022-06-05 SURGICAL SUPPLY — 34 items
ADH SKN CLS APL DERMABOND .7 (GAUZE/BANDAGES/DRESSINGS) ×1
BAG COUNTER SPONGE SURGICOUNT (BAG) ×4 IMPLANT
BAG SPNG CNTER NS LX DISP (BAG) ×2
BLADE CLIPPER SURG (BLADE) ×3 IMPLANT
CANISTER SUCT 3000ML PPV (MISCELLANEOUS) ×4 IMPLANT
DERMABOND ADVANCED (GAUZE/BANDAGES/DRESSINGS) ×1
DERMABOND ADVANCED .7 DNX12 (GAUZE/BANDAGES/DRESSINGS) ×2 IMPLANT
DRAPE LAPAROTOMY 100X72 PEDS (DRAPES) ×3 IMPLANT
DURAPREP 6ML APPLICATOR 50/CS (WOUND CARE) ×3 IMPLANT
ELECT REM PT RETURN 9FT ADLT (ELECTROSURGICAL) ×2
ELECTRODE REM PT RTRN 9FT ADLT (ELECTROSURGICAL) ×2 IMPLANT
GLOVE BIOGEL PI IND STRL 7.5 (GLOVE) ×2 IMPLANT
GLOVE BIOGEL PI INDICATOR 7.5 (GLOVE) ×2
GLOVE ECLIPSE 7.5 STRL STRAW (GLOVE) ×4 IMPLANT
GOWN STRL REUS W/ TWL LRG LVL3 (GOWN DISPOSABLE) IMPLANT
GOWN STRL REUS W/ TWL XL LVL3 (GOWN DISPOSABLE) ×2 IMPLANT
GOWN STRL REUS W/TWL LRG LVL3 (GOWN DISPOSABLE) ×2
GOWN STRL REUS W/TWL XL LVL3 (GOWN DISPOSABLE) ×2
HEMOSTAT POWDER KIT SURGIFOAM (HEMOSTASIS) ×3 IMPLANT
KIT BASIN OR (CUSTOM PROCEDURE TRAY) ×3 IMPLANT
KIT TURNOVER KIT B (KITS) ×3 IMPLANT
NEEDLE HYPO 22GX1.5 SAFETY (NEEDLE) ×3 IMPLANT
NS IRRIG 1000ML POUR BTL (IV SOLUTION) ×3 IMPLANT
PACK LAMINECTOMY NEURO (CUSTOM PROCEDURE TRAY) ×3 IMPLANT
PAD ARMBOARD 7.5X6 YLW CONV (MISCELLANEOUS) ×9 IMPLANT
STAPLER VISISTAT 35W (STAPLE) ×3 IMPLANT
SUT ETHILON 3 0 FSL (SUTURE) IMPLANT
SUT MNCRL AB 3-0 PS2 18 (SUTURE) ×3 IMPLANT
SUT VIC AB 0 CT1 18XCR BRD8 (SUTURE) ×2 IMPLANT
SUT VIC AB 0 CT1 8-18 (SUTURE) ×2
SUT VIC AB 2-0 CP2 18 (SUTURE) ×3 IMPLANT
TOWEL GREEN STERILE (TOWEL DISPOSABLE) ×3 IMPLANT
TOWEL GREEN STERILE FF (TOWEL DISPOSABLE) ×3 IMPLANT
WATER STERILE IRR 1000ML POUR (IV SOLUTION) ×3 IMPLANT

## 2022-06-05 NOTE — Anesthesia Postprocedure Evaluation (Signed)
Anesthesia Post Note  Patient: Carol [name]  Procedure(s) Performed: Removal of Posterior Cervical Hardware from Occiput to Cervical Five (Spine Cervical)     Patient location during evaluation: PACU Anesthesia Type: General Level of consciousness: awake and alert Pain management: pain level controlled Vital Signs Assessment: post-procedure vital signs reviewed and stable Respiratory status: spontaneous breathing, nonlabored ventilation and respiratory function stable Cardiovascular status: blood pressure returned to baseline and stable Postop Assessment: no apparent nausea or vomiting Anesthetic complications: yes   Encounter Notable Events  Notable Event Outcome Phase Comment  Difficult to intubate - expected  Intraprocedure Filed from anesthesia note documentation.    Last Vitals:  Vitals:   06/05/22 0945 06/05/22 1000  BP: 118/66 124/63  Pulse: 78 74  Resp: 14 14  Temp:  (!) 36.4 C  SpO2: 97% 96%    Last Pain:  Vitals:   06/05/22 1000  TempSrc:   PainSc: 0-No pain                 Jasper Ruminski,W. EDMOND

## 2022-06-05 NOTE — Anesthesia Procedure Notes (Signed)
Procedure Name: Intubation Date/Time: 06/05/2022 7:41 AM  Performed by: Gwyndolyn Saxon, CRNAPre-anesthesia Checklist: Patient identified, Emergency Drugs available, Suction available and Patient being monitored Patient Re-evaluated:Patient Re-evaluated prior to induction Oxygen Delivery Method: Circle system utilized Preoxygenation: Pre-oxygenation with 100% oxygen Induction Type: IV induction Ventilation: Mask ventilation without difficulty Laryngoscope Size: Glidescope and 3 Grade View: Grade I Tube type: Oral Tube size: 7.0 mm Number of attempts: 1 Airway Equipment and Method: Rigid stylet Placement Confirmation: ETT inserted through vocal cords under direct vision, positive ETCO2 and breath sounds checked- equal and bilateral Secured at: 20 cm Tube secured with: Tape Dental Injury: Teeth and Oropharynx as per pre-operative assessment  Difficulty Due To: Difficulty was anticipated and Difficult Airway- due to reduced neck mobility

## 2022-06-05 NOTE — Transfer of Care (Signed)
Immediate Anesthesia Transfer of Care Note  Patient: Carol [name]  Procedure(s) Performed: Removal of Posterior Cervical Hardware from Occiput to Cervical Five (Spine Cervical)  Patient Location: PACU  Anesthesia Type:General  Level of Consciousness: drowsy and patient cooperative  Airway & Oxygen Therapy: Patient Spontanous Breathing  Post-op Assessment: Report given to RN and Post -op Vital signs reviewed and stable  Post vital signs: Reviewed and stable  Last Vitals:  Vitals Value Taken Time  BP 124/66 06/05/22 0930  Temp    Pulse 81 06/05/22 0935  Resp 10 06/05/22 0935  SpO2 97 % 06/05/22 0935  Vitals shown include unvalidated device data.  Last Pain:  Vitals:   06/05/22 0602  TempSrc:   PainSc: 0-No pain         Complications:  Encounter Notable Events  Notable Event Outcome Phase Comment  Difficult to intubate - expected  Intraprocedure Filed from anesthesia note documentation.

## 2022-06-05 NOTE — Op Note (Signed)
PATIENT: Carol [name]  DAY OF SURGERY: 06/05/22   PRE-OPERATIVE DIAGNOSIS:  Pathologic C2 fracture, multiple myeloma   POST-OPERATIVE DIAGNOSIS:  Same   PROCEDURE:  Removal of hardware from occiput to C5   SURGEON:  Surgeon(s) and Role:    Judith Part, MD - Primary   ANESTHESIA: ETGA   BRIEF HISTORY: This is a 66 year old woman who originally presented with severe neck pain, was found to have multiple myeloma with extensive lytic destruction of C2 and pathologic fracture and I performed a stabilization and instrumented fusion. She did quite well and repeat CT showed arthrodesis of C2-3 with likely C1-2 as well. We discussed that, with healing and control of the primary, we could consider removal of hardware to restore some ROM, especially with respect to the O-1 joint. This was discussed with the patient as well as risks, benefits, and alternatives and wished to proceed with surgery.   OPERATIVE DETAIL: The patient was taken to the operating room, anesthesia was induced by the anesthesia team, and the patient was placed on the OR table in the prone position. A formal time out was performed with two patient identifiers and confirmed the operative site. The operative site was marked, hair was clipped with surgical clippers, the area was then prepped and draped in a sterile fashion.   The prior midline occipitocervical incision was opened. Soft tissues were dissected to expose the hardware. The caps were removed, rods were removed, and bilateral polyaxial screws were removed at C1, C3, C4, and C5 followed by removal of the occipital plate.   The wound was copiously irrigated, all instrument and sponge counts were correct, and the incision was then closed in layers. The patient was then returned to anesthesia for emergence. No apparent complications at the completion of the procedure.   EBL:  55m   DRAINS: none   SPECIMENS: none   TJudith Part MD 06/05/22 7:22 AM

## 2022-06-05 NOTE — H&P (Signed)
Surgical H&P Update  HPI: 66 y.o. with a history of MM with C2 lytic fracture s/p prior PSIF, MM well controlled at this time, fracture healed and pt wanted to have hardware removed for restoration of motion, if possible. No changes in health since they were last seen. Still having the above and wishes to proceed with surgery.  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 She has never used smokeless tobacco. She reports current alcohol use. She reports that she does not use drugs.  Physical Exam: Strength 5/5 x4 and SILTx4  Assesment/Plan: 66 y.o. woman with MM s/p PSIF, here for hardware removal. Risks, benefits, and alternatives discussed and the patient would like to continue with surgery.  -OR today -home from PACU post-op versus overnight obs  Judith Part, MD 06/05/22 7:20 AM

## 2022-06-06 ENCOUNTER — Encounter (HOSPITAL_COMMUNITY): Payer: Self-pay | Admitting: Neurological Surgery

## 2022-06-12 ENCOUNTER — Other Ambulatory Visit: Payer: Self-pay

## 2022-06-16 ENCOUNTER — Inpatient Hospital Stay: Payer: BC Managed Care – PPO

## 2022-06-16 ENCOUNTER — Inpatient Hospital Stay: Payer: BC Managed Care – PPO | Admitting: Hematology and Oncology

## 2022-06-16 ENCOUNTER — Other Ambulatory Visit: Payer: Self-pay

## 2022-06-16 VITALS — BP 119/66 | HR 75 | Temp 98.0°F | Resp 14 | Wt 161.0 lb

## 2022-06-16 DIAGNOSIS — Z95828 Presence of other vascular implants and grafts: Secondary | ICD-10-CM | POA: Diagnosis not present

## 2022-06-16 DIAGNOSIS — C9000 Multiple myeloma not having achieved remission: Secondary | ICD-10-CM

## 2022-06-16 DIAGNOSIS — C9030 Solitary plasmacytoma not having achieved remission: Secondary | ICD-10-CM | POA: Diagnosis not present

## 2022-06-16 LAB — CMP (CANCER CENTER ONLY)
ALT: 9 U/L (ref 0–44)
AST: 14 U/L — ABNORMAL LOW (ref 15–41)
Albumin: 4.3 g/dL (ref 3.5–5.0)
Alkaline Phosphatase: 77 U/L (ref 38–126)
Anion gap: 5 (ref 5–15)
BUN: 28 mg/dL — ABNORMAL HIGH (ref 8–23)
CO2: 31 mmol/L (ref 22–32)
Calcium: 9.4 mg/dL (ref 8.9–10.3)
Chloride: 105 mmol/L (ref 98–111)
Creatinine: 0.99 mg/dL (ref 0.44–1.00)
GFR, Estimated: >60 mL/min (ref >60)
Glucose, Bld: 129 mg/dL — ABNORMAL HIGH (ref 70–99)
Potassium: 4 mmol/L (ref 3.5–5.1)
Sodium: 141 mmol/L (ref 135–145)
Total Bilirubin: 0.7 mg/dL (ref 0.3–1.2)
Total Protein: 6.4 g/dL — ABNORMAL LOW (ref 6.5–8.1)

## 2022-06-16 LAB — CBC WITH DIFFERENTIAL (CANCER CENTER ONLY)
Abs Immature Granulocytes: 0.01 10*3/uL (ref 0.00–0.07)
Basophils Absolute: 0 10*3/uL (ref 0.0–0.1)
Basophils Relative: 0 %
Eosinophils Absolute: 0 10*3/uL (ref 0.0–0.5)
Eosinophils Relative: 0 %
HCT: 34.1 % — ABNORMAL LOW (ref 36.0–46.0)
Hemoglobin: 11.8 g/dL — ABNORMAL LOW (ref 12.0–15.0)
Immature Granulocytes: 0 %
Lymphocytes Relative: 23 %
Lymphs Abs: 0.8 10*3/uL (ref 0.7–4.0)
MCH: 33.9 pg (ref 26.0–34.0)
MCHC: 34.6 g/dL (ref 30.0–36.0)
MCV: 98 fL (ref 80.0–100.0)
Monocytes Absolute: 0.3 10*3/uL (ref 0.1–1.0)
Monocytes Relative: 10 %
Neutro Abs: 2.1 10*3/uL (ref 1.7–7.7)
Neutrophils Relative %: 67 %
Platelet Count: 169 10*3/uL (ref 150–400)
RBC: 3.48 MIL/uL — ABNORMAL LOW (ref 3.87–5.11)
RDW: 12.6 % (ref 11.5–15.5)
WBC Count: 3.2 10*3/uL — ABNORMAL LOW (ref 4.0–10.5)
nRBC: 0 % (ref 0.0–0.2)

## 2022-06-16 LAB — LACTATE DEHYDROGENASE: LDH: 211 U/L — ABNORMAL HIGH (ref 98–192)

## 2022-06-17 ENCOUNTER — Other Ambulatory Visit: Payer: Self-pay

## 2022-06-19 LAB — KAPPA/LAMBDA LIGHT CHAINS
Kappa free light chain: 5.2 mg/L (ref 3.3–19.4)
Kappa, lambda light chain ratio: 1.73 — ABNORMAL HIGH (ref 0.26–1.65)
Lambda free light chains: 3 mg/L — ABNORMAL LOW (ref 5.7–26.3)

## 2022-06-20 ENCOUNTER — Other Ambulatory Visit: Payer: Self-pay

## 2022-06-20 LAB — MULTIPLE MYELOMA PANEL, SERUM
Albumin SerPl Elph-Mcnc: 4 g/dL (ref 2.9–4.4)
Albumin/Glob SerPl: 2.2 — ABNORMAL HIGH (ref 0.7–1.7)
Alpha 1: 0.3 g/dL (ref 0.0–0.4)
Alpha2 Glob SerPl Elph-Mcnc: 0.6 g/dL (ref 0.4–1.0)
B-Globulin SerPl Elph-Mcnc: 0.9 g/dL (ref 0.7–1.3)
Gamma Glob SerPl Elph-Mcnc: 0.1 g/dL — ABNORMAL LOW (ref 0.4–1.8)
Globulin, Total: 1.9 g/dL — ABNORMAL LOW (ref 2.2–3.9)
IgA: 6 mg/dL — ABNORMAL LOW (ref 87–352)
IgG (Immunoglobin G), Serum: 134 mg/dL — ABNORMAL LOW (ref 586–1602)
IgM (Immunoglobulin M), Srm: <5 mg/dL — ABNORMAL LOW (ref 26–217)
Total Protein ELP: 5.9 g/dL — ABNORMAL LOW (ref 6.0–8.5)

## 2022-06-23 ENCOUNTER — Other Ambulatory Visit: Payer: Self-pay

## 2022-06-23 ENCOUNTER — Inpatient Hospital Stay: Payer: BC Managed Care – PPO | Attending: Radiation Oncology

## 2022-06-23 ENCOUNTER — Inpatient Hospital Stay: Payer: BC Managed Care – PPO

## 2022-06-23 VITALS — BP 106/40 | HR 78 | Temp 98.3°F | Resp 16 | Wt 162.5 lb

## 2022-06-23 DIAGNOSIS — Z5112 Encounter for antineoplastic immunotherapy: Secondary | ICD-10-CM | POA: Insufficient documentation

## 2022-06-23 DIAGNOSIS — C9000 Multiple myeloma not having achieved remission: Secondary | ICD-10-CM

## 2022-06-23 DIAGNOSIS — D72819 Decreased white blood cell count, unspecified: Secondary | ICD-10-CM | POA: Diagnosis not present

## 2022-06-23 DIAGNOSIS — Z95828 Presence of other vascular implants and grafts: Secondary | ICD-10-CM

## 2022-06-23 DIAGNOSIS — Z79899 Other long term (current) drug therapy: Secondary | ICD-10-CM | POA: Diagnosis not present

## 2022-06-23 LAB — CBC WITH DIFFERENTIAL (CANCER CENTER ONLY)
Abs Immature Granulocytes: 0.01 10*3/uL (ref 0.00–0.07)
Basophils Absolute: 0 10*3/uL (ref 0.0–0.1)
Basophils Relative: 1 %
Eosinophils Absolute: 0 10*3/uL (ref 0.0–0.5)
Eosinophils Relative: 0 %
HCT: 32.7 % — ABNORMAL LOW (ref 36.0–46.0)
Hemoglobin: 11.5 g/dL — ABNORMAL LOW (ref 12.0–15.0)
Immature Granulocytes: 0 %
Lymphocytes Relative: 24 %
Lymphs Abs: 0.7 10*3/uL (ref 0.7–4.0)
MCH: 33.8 pg (ref 26.0–34.0)
MCHC: 35.2 g/dL (ref 30.0–36.0)
MCV: 96.2 fL (ref 80.0–100.0)
Monocytes Absolute: 0.2 10*3/uL (ref 0.1–1.0)
Monocytes Relative: 7 %
Neutro Abs: 2.1 10*3/uL (ref 1.7–7.7)
Neutrophils Relative %: 68 %
Platelet Count: 165 10*3/uL (ref 150–400)
RBC: 3.4 MIL/uL — ABNORMAL LOW (ref 3.87–5.11)
RDW: 12.9 % (ref 11.5–15.5)
WBC Count: 3.1 10*3/uL — ABNORMAL LOW (ref 4.0–10.5)
nRBC: 0 % (ref 0.0–0.2)

## 2022-06-23 LAB — CMP (CANCER CENTER ONLY)
ALT: 11 U/L (ref 0–44)
AST: 13 U/L — ABNORMAL LOW (ref 15–41)
Albumin: 4.2 g/dL (ref 3.5–5.0)
Alkaline Phosphatase: 77 U/L (ref 38–126)
Anion gap: 6 (ref 5–15)
BUN: 29 mg/dL — ABNORMAL HIGH (ref 8–23)
CO2: 26 mmol/L (ref 22–32)
Calcium: 9.1 mg/dL (ref 8.9–10.3)
Chloride: 108 mmol/L (ref 98–111)
Creatinine: 0.78 mg/dL (ref 0.44–1.00)
GFR, Estimated: >60 mL/min (ref >60)
Glucose, Bld: 141 mg/dL — ABNORMAL HIGH (ref 70–99)
Potassium: 3.8 mmol/L (ref 3.5–5.1)
Sodium: 140 mmol/L (ref 135–145)
Total Bilirubin: 0.6 mg/dL (ref 0.3–1.2)
Total Protein: 6.2 g/dL — ABNORMAL LOW (ref 6.5–8.1)

## 2022-06-23 LAB — LACTATE DEHYDROGENASE: LDH: 189 U/L (ref 98–192)

## 2022-06-23 MED ORDER — SODIUM CHLORIDE 0.9% FLUSH
10.0000 mL | INTRAVENOUS | Status: DC | PRN
  Administered 2022-06-23: 10 mL

## 2022-06-23 MED ORDER — HEPARIN SOD (PORK) LOCK FLUSH 100 UNIT/ML IV SOLN
500.0000 [IU] | Freq: Once | INTRAVENOUS | Status: AC | PRN
  Administered 2022-06-23: 500 [IU]

## 2022-06-23 MED ORDER — SODIUM CHLORIDE 0.9 % IV SOLN: Freq: Once | INTRAVENOUS | Status: AC

## 2022-06-23 MED ORDER — DEXTROSE 5 % IV SOLN
70.0000 mg/m2 | Freq: Once | INTRAVENOUS | Status: AC
  Administered 2022-06-23: 130 mg via INTRAVENOUS
  Filled 2022-06-23: qty 60

## 2022-06-23 MED ORDER — SODIUM CHLORIDE 0.9% FLUSH
10.0000 mL | Freq: Once | INTRAVENOUS | Status: AC
  Administered 2022-06-23: 10 mL

## 2022-06-23 MED ORDER — SODIUM CHLORIDE 0.9 % IV SOLN: Freq: Once | INTRAVENOUS | Status: DC

## 2022-06-23 NOTE — Patient Instructions (Addendum)
[facility]  Discharge Instructions: Thank you for choosing [facility] to provide your oncology and hematology care.   If you have a lab appointment with the Cancer Center, please go directly to the Cancer Center and check in at the registration area.   Wear comfortable clothing and clothing appropriate for easy access to any Portacath or PICC line.   We strive to give you quality time with your provider. You may need to reschedule your appointment if you arrive late (15 or more minutes).  Arriving late affects you and other patients whose appointments are after yours.  Also, if you miss three or more appointments without notifying the office, you may be dismissed from the clinic at the provider's discretion.      For prescription refill requests, have your pharmacy contact our office and allow 72 hours for refills to be completed.    Today you received the following chemotherapy and/or immunotherapy agents: Kyprolis.       To help prevent nausea and vomiting after your treatment, we encourage you to take your nausea medication as directed.  BELOW ARE SYMPTOMS THAT SHOULD BE REPORTED IMMEDIATELY: *FEVER GREATER THAN 100.4 F (38 C) OR HIGHER *CHILLS OR SWEATING *NAUSEA AND VOMITING THAT IS NOT CONTROLLED WITH YOUR NAUSEA MEDICATION *UNUSUAL SHORTNESS OF BREATH *UNUSUAL BRUISING OR BLEEDING *URINARY PROBLEMS (pain or burning when urinating, or frequent urination) *BOWEL PROBLEMS (unusual diarrhea, constipation, pain near the anus) TENDERNESS IN MOUTH AND THROAT WITH OR WITHOUT PRESENCE OF ULCERS (sore throat, sores in mouth, or a toothache) UNUSUAL RASH, SWELLING OR PAIN  UNUSUAL VAGINAL DISCHARGE OR ITCHING   Items with * indicate a potential emergency and should be followed up as soon as possible or go to the Emergency Department if any problems should occur.  Please show the CHEMOTHERAPY ALERT CARD or IMMUNOTHERAPY ALERT CARD at check-in to  the Emergency Department and triage nurse.  Should you have questions after your visit or need to cancel or reschedule your appointment, please contact [facility]  Dept: [phone]  and follow the prompts.  Office hours are 8:00 a.m. to 4:30 p.m. Monday - Friday. Please note that voicemails left after 4:00 p.m. may not be returned until the following business day.  We are closed weekends and major holidays. You have access to a nurse at all times for urgent questions. Please call the main number to the clinic Dept: [phone] and follow the prompts.   For any non-urgent questions, you may also contact your provider using MyChart. We now offer e-Visits for anyone 18 and older to request care online for non-urgent symptoms. For details visit [email or web address].   Also download the MyChart app! Go to the app store, search "MyChart", open the app, select [facility], and log in with your MyChart username and password.  Masks are optional in the cancer centers. If you would like for your care team to wear a mask while they are taking care of you, please let them know. You may have one support person who is at least 67 years old accompany you for your appointments. 

## 2022-06-23 NOTE — Progress Notes (Signed)
She took at 7 am today at home 40 mg Dexamethazone.

## 2022-06-24 ENCOUNTER — Encounter: Payer: Self-pay | Admitting: Hematology and Oncology

## 2022-06-24 NOTE — Progress Notes (Signed)
[place] [phone]   [phone]  PROGRESS NOTE  Patient Care Team: [name] as PCP - General (Physician Assistant)  Hematological/Oncological History # Plasmacytoma with Multiple Myeloma  07/29/2021: CT cervical spine shows expansile lytic lesion which almost completely replaces the normal C2 vertebral body  08/24/2021: PET CT scan shows increased metabolic activity at C2 and C7  08/25/2021: biopsy of C2 lesion showed finding consistent with plasma cell neoplasm.  09/08/2021: start radiation therapy 09/12/2021: establish care with Dr. Lorenso Courier  09/28/2021: end radiation therapy 10/10/2021: Bone marrow biopsy. Results confirm plasma cell neoplasm consistent with multiple myeloma, kappa restricted.  10/26/2021: Cycle 1 Day 1 of VRD 11/18/2021: Cycle 2 Day 1 of VRD 11/25/2021: Velcade HELD due to full body rash. Resolved with steroid therapy.  12/16/2021: Cycle 1 Day 1 of Dara/Rev/Dex 12/30/2021: HELD Daratumumab due to toxicity. Patient unable to tolerate. Requested holding the medication  01/06/2022: Cycle 1 Day 1 of Kd  02/03/2022: Cycle 2 Day 1 of Kd 03/03/2022: Cycle 3 Day 1 of Kd 03/31/2022: Cycle 4 Day 1 of Kd 04/28/2022: Cycle 5 Day 1 of Kd 05/26/2022: Cycle 6 Day 1 of Kd   Interval History:  GWENEVERE GOGA 66 y.o. female with medical history significant for multiple myeloma with plasmacytoma who presents for a follow up visit. The patient's last visit was on 05/26/2022.  In the interim since her last visit she underwent removal of posterior cervical hardware on 06/05/2022.  On exam today Mrs. Sagen reports she is recovering from surgery quite well.  She notes that she does continue to have trouble lifting her neck up and she has a postop visit next week.  She notes that she is doing her best to try to build up her neck muscles.  She reports that she had pain for the first 2 days but that it is under good control.  She has been on Tylenol only to  help with the pain.  She notes that she does occasionally have some neck spasms.  She otherwise reports that she has been feeling well.  Her appetite is quite good and otherwise she is willing and able to proceed with treatment at this time.  She denies fevers, chills, night sweats, shortness of breath, chest pain or cough. She has no other complaints. Rest of the 10 point ROS is below.  MEDICAL HISTORY:  Past Medical History:  Diagnosis Date   Allergy    Anemia    Ankle edema    both ankles   Arthritis    hands,knees,elbows   Cancer ([place]) 2016   tumor kidney   Diabetes mellitus    type 2    Eczema    History of kidney stones    Hypothyroidism    Obesity    Sleep apnea    cpap machine setting of 3   Thyroid disease     SURGICAL HISTORY: Past Surgical History:  Procedure Laterality Date   BREAST BIOPSY Right 2001   CHOLECYSTECTOMY  2009   open   COLONOSCOPY     CYSTOSCOPY W/ RETROGRADES Left 11/22/2018   Procedure: CYSTOSCOPY WITH RETROGRADE PYELOGRAM;  Surgeon: Ardis Hughs, MD;  Location: [facility];  Service: Urology;  Laterality: Left;   CYSTOSCOPY WITH RETROGRADE PYELOGRAM, URETEROSCOPY AND STENT PLACEMENT Right 10/03/2021   Procedure: CYSTOSCOPY WITH RETROGRADE PYELOGRAM, URETEROSCOPY,STONE EXTRACTION AND STENT PLACEMENT;  Surgeon: Ceasar Mons, MD;  Location: [facility];  Service: Urology;  Laterality: Right;   DILATATION &  CURRETTAGE/HYSTEROSCOPY WITH RESECTOCOPE N/A 01/21/2013   Procedure: DILATATION & CURETTAGE/HYSTEROSCOPY WITH RESECTOCOPE AND RESECTION OF ENDOMETRIAL;  Surgeon: Selinda Orion, MD;  [facility]: [facility];  Service: Gynecology;  Laterality: N/A;   HARDWARE REMOVAL N/A 06/05/2022   Procedure: Removal of Posterior Cervical Hardware from Occiput to Cervical Five;  Surgeon: Judith Part, MD;  Location: [facility];  Service: Neurosurgery;  Laterality: N/A;   IR IMAGING GUIDED PORT INSERTION  02/01/2022   KNEE ARTHROSCOPY     left     POLYPECTOMY     POSTERIOR CERVICAL FUSION/FORAMINOTOMY N/A 08/25/2021   Procedure: Occiput to Cervical 5 Posterior cervical instrumented fusion with open biopsy of Cervical 2 Mass. Cervical Two Ganglionectomy;  Surgeon: Judith Part, MD;  Location: [facility];  Service: Neurosurgery;  Laterality: N/A;   ROBOT ASSISTED PYELOPLASTY Left 11/22/2018   Procedure: XI ROBOTIC ASSISTED ATTEMPTED PYELOPLASTY CONVERTED TO NEPHRECTOMY/LYSIS OF ADHESIONS/UMBILICAL HERNIA REPAIR;  Surgeon: Ardis Hughs, MD;  Location: [facility];  Service: Urology;  Laterality: Left;   ROBOTIC ASSITED PARTIAL NEPHRECTOMY Left 06/11/2015   Procedure: LEFT ROBOTIC ASSISTED LAPAROSCOPIC  PARTIAL NEPHRECTOMY;  Surgeon: Ardis Hughs, MD;  Location: [facility];  Service: Urology;  Laterality: Left;   ROTATOR CUFF REPAIR Left 2011   URETERAL BIOPSY Left 02/14/2017   Procedure: URETERAL BIOPSY;  Surgeon: Ardis Hughs, MD;  Location: [facility];  Service: Urology;  Laterality: Left;   URETEROSCOPY WITH HOLMIUM LASER LITHOTRIPSY Left 02/14/2017   Procedure: URETEROSCOPY LITHOTRIPSY, URETERAL BALLOON DILATION;  Surgeon: Ardis Hughs, MD;  Location: [facility];  Service: Urology;  Laterality: Left;  With STENT    SOCIAL HISTORY: Social History   Socioeconomic History   Marital status: Married    Spouse name: Not on file   Number of children: Not on file   Years of education: Not on file   Highest education level: Not on file  Occupational History   Not on file  Tobacco Use   Smoking status: Never   Smokeless tobacco: Never  Vaping Use   Vaping Use: Never used  Substance and Sexual Activity   Alcohol use: Yes    Comment: occ   Drug use: No   Sexual activity: Not on file  Other Topics Concern   Not on file  Social History Narrative   Not on file   Social Determinants of Health   Financial Resource Strain: Low Risk  (11/22/2018)   Overall Financial Resource Strain (CARDIA)    Difficulty of Paying Living  Expenses: Not hard at all  Food Insecurity: Unknown (11/22/2018)   Hunger Vital Sign    Worried About [place] in the Last Year: Patient refused    [place] in the Last Year: Patient refused  Transportation Needs: Unknown (11/22/2018)   PRAPARE - Transportation    Lack of Transportation (Medical): Patient refused    Lack of Transportation (Non-Medical): Patient refused  Physical Activity: Unknown (11/22/2018)   Exercise Vital Sign    Days of Exercise per Week: Patient refused    Minutes of Exercise per Session: Patient refused  Stress: No Stress Concern Present (11/22/2018)   [place] of Stress : Not at all  Social Connections: Unknown (11/22/2018)   Social Connection and Isolation Panel [NHANES]    Frequency of Communication with Friends and Family: Patient refused    Frequency of Social Gatherings with Friends and Family: Patient  refused    Attends Religious Services: Patient refused    Active Member of Clubs or Organizations: Patient refused    Attends Archivist Meetings: Patient refused    Marital Status: Patient refused  Intimate Partner Violence: Unknown (11/22/2018)   Humiliation, Afraid, Rape, and Kick questionnaire    Fear of Current or Ex-Partner: Patient refused    Emotionally Abused: Patient refused    Physically Abused: Patient refused    Sexually Abused: Patient refused    FAMILY HISTORY: Family History  Problem Relation Age of Onset   Cancer Father     ALLERGIES:  is allergic to morphine.  MEDICATIONS:  Current Outpatient Medications  Medication Sig Dispense Refill   acyclovir (ZOVIRAX) 400 MG tablet TAKE 1 TABLET BY MOUTH TWICE A DAY 180 tablet 1   allopurinol (ZYLOPRIM) 300 MG tablet TAKE 1 TABLET BY MOUTH EVERY DAY 90 tablet 1   Ascorbic Acid (VITAMIN C ADULT GUMMIES PO) Take 1 tablet by mouth daily.     aspirin EC 81 MG tablet Take 1 tablet (81 mg total) by  mouth daily. Swallow whole. 90 tablet 3   atorvastatin (LIPITOR) 20 MG tablet Take 20 mg by mouth at bedtime.     dexamethasone (DECADRON) 4 MG tablet Take 40 mg by mouth every Friday.     furosemide (LASIX) 20 MG tablet Take 20 mg by mouth daily.     HEMADY 20 MG TABS TAKE 40 MG BY MOUTH ONCE A WEEK. 2 TABLETS ON DAY OF CHEMO TREATMENT. (Patient not taking: Reported on 05/24/2022) 60 tablet 3   KLOR-CON M20 20 MEQ tablet TAKE 1 TABLET BY MOUTH EVERY DAY 30 tablet 1   levothyroxine (SYNTHROID) 50 MCG tablet Take 50 mcg by mouth daily before breakfast.     lidocaine-prilocaine (EMLA) cream Apply 1 application. topically as needed. 30 g 0   Magnesium Hydroxide (MILK OF MAGNESIA PO) Take 15 mLs by mouth daily as needed (constipation/diarrhea).     ondansetron (ZOFRAN) 8 MG tablet Take 1 tablet (8 mg total) by mouth every 8 (eight) hours as needed. 30 tablet 0   oxyCODONE-acetaminophen (PERCOCET) 5-325 MG tablet Take 1 tablet by mouth every 4 (four) hours as needed (pain). 30 tablet 0   OZEMPIC, 0.25 OR 0.5 MG/DOSE, 2 MG/1.5ML SOPN Inject 0.5 mg into the skin every Saturday.     PROAIR HFA 108 (90 BASE) MCG/ACT inhaler Inhale 2 puffs into the lungs every 4 (four) hours as needed for wheezing or shortness of breath. Uses seasonal  3   prochlorperazine (COMPAZINE) 10 MG tablet Take 1 tablet (10 mg total) by mouth every 6 (six) hours as needed for nausea or vomiting. 30 tablet 0   Sennosides (SENOKOT PO) Take 1 tablet by mouth daily as needed (constipation). Bid     vitamin B-12 (CYANOCOBALAMIN) 500 MCG tablet Take 500 mcg by mouth daily.     Vitamin D, Ergocalciferol, (DRISDOL) 1.25 MG (50000 UNIT) CAPS capsule Take 50,000 Units by mouth once a week.     No current facility-administered medications for this visit.    REVIEW OF SYSTEMS:   Constitutional: ( - ) fevers, ( - )  chills , ( - ) night sweats Eyes: ( - ) blurriness of vision, ( - ) double vision, ( - ) watery eyes Ears, nose, mouth, throat,  and face: ( - ) mucositis, ( - ) sore throat Respiratory: ( - ) cough, ( - ) dyspnea, ( - ) wheezes Cardiovascular: ( - )  palpitation, ( - ) chest discomfort, ( - ) lower extremity swelling Gastrointestinal:  ( - ) nausea, ( - ) heartburn, ( -) change in bowel habits Skin: ( - ) abnormal skin rashes Lymphatics: ( - ) new lymphadenopathy, ( - ) easy bruising Neurological: ( - ) numbness, ( - ) tingling, ( - ) new weaknesses Behavioral/Psych: ( - ) mood change, ( - ) new changes  All other systems were reviewed with the patient and are negative.  PHYSICAL EXAMINATION: ECOG PERFORMANCE STATUS: 1 - Symptomatic but completely ambulatory  Vitals:   06/16/22 1026  BP: 119/66  Pulse: 75  Resp: 14  Temp: 98 F (36.7 C)  SpO2: 100%     Filed Weights   06/16/22 1026  Weight: 161 lb (73 kg)    GENERAL: Well-appearing middle-age Caucasian female alert, no distress and comfortable SKIN: No evidence of rash, erythema, dry skin, or raised lesions. EYES: conjunctiva are pink and non-injected, sclera clear LUNGS: clear to auscultation and percussion with normal breathing effort HEART: regular rate & rhythm and no murmurs and no lower extremity edema Musculoskeletal: no cyanosis of digits and no clubbing  PSYCH: alert & oriented x 3, fluent speech NEURO: no focal motor/sensory deficits  LABORATORY DATA:  I have reviewed the data as listed    Latest Ref Rng & Units 06/23/2022    8:14 AM 06/16/2022    9:59 AM 05/26/2022    8:06 AM  CBC  WBC 4.0 - 10.5 K/uL 3.1  3.2  3.8   Hemoglobin 12.0 - 15.0 g/dL 11.5  11.8  11.2   Hematocrit 36.0 - 46.0 % 32.7  34.1  31.8   Platelets 150 - 400 K/uL 165  169  213        Latest Ref Rng & Units 06/23/2022    8:14 AM 06/16/2022    9:59 AM 05/26/2022    8:06 AM  CMP  Glucose 70 - 99 mg/dL 141  129  191   BUN 8 - 23 mg/dL 29  28  34   Creatinine 0.44 - 1.00 mg/dL 0.78  0.99  0.84   Sodium 135 - 145 mmol/L 140  141  140   Potassium 3.5 - 5.1 mmol/L 3.8   4.0  4.0   Chloride 98 - 111 mmol/L 108  105  109   CO2 22 - 32 mmol/L '26  31  25   ' Calcium 8.9 - 10.3 mg/dL 9.1  9.4  9.2   Total Protein 6.5 - 8.1 g/dL 6.2  6.4  6.0   Total Bilirubin 0.3 - 1.2 mg/dL 0.6  0.7  0.7   Alkaline Phos 38 - 126 U/L 77  77  62   AST 15 - 41 U/L '13  14  14   ' ALT 0 - 44 U/L '11  9  11     ' Lab Results  Component Value Date   MPROTEIN Not Observed 06/16/2022   MPROTEIN Not Observed 05/05/2022   MPROTEIN Not Observed 04/07/2022   Lab Results  Component Value Date   KPAFRELGTCHN 5.2 06/16/2022   KPAFRELGTCHN 3.3 05/05/2022   KPAFRELGTCHN 4.5 04/07/2022   LAMBDASER 3.0 (L) 06/16/2022   LAMBDASER 2.0 (L) 05/05/2022   LAMBDASER 2.2 (L) 04/07/2022   KAPLAMBRATIO 1.73 (H) 06/16/2022   KAPLAMBRATIO 1.65 05/05/2022   KAPLAMBRATIO 2.05 (H) 04/07/2022    RADIOGRAPHIC STUDIES: No results found.  ASSESSMENT & PLAN Carol [name] 66 y.o. female with medical history significant for multiple myeloma  with plasmacytoma who presents for a follow up visit.   # Free Kappa Multiple Myeloma # Plasmacytoma  -- original finding of L2 plasmacytoma on biopsy of back lesion, bone marrow biopsy confirms greater than 60% plasma cells consistent with multiple myeloma. --Cycle 1 Day 1 of VRD treatment started on 10/26/2021.  --VRD therapy stopped on 11/25/2021 due to rash. Started Dara/Rev/Dex on 12/16/2021.  --will order monthly SPEP, UPEP, SFLC and beta 2 microglobulin --weekly CBC, CMP, and LDH --Cycle 1 Day 1 of Kd started on 01/06/2022 Plan:  --Labs today were reviewed and adequate for treatment today.  --Cycle 6 Day 8 of Kd therapy today.  -- Patient is currently healing well from her surgery, okay to proceed with treatment at this time. --RTC in 2 weeks with interval weekly treatment.   #Leukopenia/Thrombocytopenia/Anemia --WBC 3.1, levels oscillate. Hgb 11.5, stable. Platelet count 165 --Likely secondary to chemotherapy --continue to monitor.    #Rash--resolved --Present for approximately one week --Received IV solumedrol and medrol dose pak.  --Suspect drug induced (velcade)  #Thrush, resolved: --Treated with Nystatin mouthwash with improvement.   #Metallic taste/weight loss-improving:  --Encouraged to increase caloric intake and supplement with protein shakes.  #Supportive Care -- chemotherapy education complete  -- port placement complete. -- sent prescription for EMLA cream -- zofran 65m q8H PRN and compazine 149mPO q6H for nausea -- acyclovir 40069mO BID for VCZ prophylaxis -- allopurinol 300m18m daily for TLS prophylaxis -- Started zometa on 02/03/2022. Will continue q 3 months.HOLD zometa in June 2023 due to upcoming spine surgery, will restart sometime thereafter.  -- no pain medication required at this time.   No orders of the defined types were placed in this encounter.  All questions were answered. The patient knows to call the clinic with any problems, questions or concerns.  I have spent a total of 30 minutes minutes of face-to-face and non-face-to-face time, preparing to see the patient, performing a medically appropriate examination, counseling and educating the patient, ordering medications/tests, documenting clinical information in the electronic health record, and care coordination.   JohnLedell Peoples Department of Hematology/Oncology [facility]: [phone]: [phone]: [email or web address]  06/24/2022 3:43 PM

## 2022-06-30 ENCOUNTER — Inpatient Hospital Stay: Payer: BC Managed Care – PPO

## 2022-06-30 ENCOUNTER — Other Ambulatory Visit: Payer: Self-pay

## 2022-06-30 ENCOUNTER — Inpatient Hospital Stay: Payer: BC Managed Care – PPO | Admitting: Hematology and Oncology

## 2022-06-30 VITALS — BP 122/61 | HR 81 | Temp 98.3°F | Resp 18 | Wt 160.5 lb

## 2022-06-30 DIAGNOSIS — Z95828 Presence of other vascular implants and grafts: Secondary | ICD-10-CM | POA: Diagnosis not present

## 2022-06-30 DIAGNOSIS — C9000 Multiple myeloma not having achieved remission: Secondary | ICD-10-CM

## 2022-06-30 LAB — CBC WITH DIFFERENTIAL (CANCER CENTER ONLY)
Abs Immature Granulocytes: 0.02 10*3/uL (ref 0.00–0.07)
Basophils Absolute: 0 10*3/uL (ref 0.0–0.1)
Basophils Relative: 0 %
Eosinophils Absolute: 0 10*3/uL (ref 0.0–0.5)
Eosinophils Relative: 0 %
HCT: 33.9 % — ABNORMAL LOW (ref 36.0–46.0)
Hemoglobin: 12.1 g/dL (ref 12.0–15.0)
Immature Granulocytes: 0 %
Lymphocytes Relative: 8 %
Lymphs Abs: 0.4 10*3/uL — ABNORMAL LOW (ref 0.7–4.0)
MCH: 34 pg (ref 26.0–34.0)
MCHC: 35.7 g/dL (ref 30.0–36.0)
MCV: 95.2 fL (ref 80.0–100.0)
Monocytes Absolute: 0.1 10*3/uL (ref 0.1–1.0)
Monocytes Relative: 2 %
Neutro Abs: 4.6 10*3/uL (ref 1.7–7.7)
Neutrophils Relative %: 90 %
Platelet Count: 112 10*3/uL — ABNORMAL LOW (ref 150–400)
RBC: 3.56 MIL/uL — ABNORMAL LOW (ref 3.87–5.11)
RDW: 13 % (ref 11.5–15.5)
WBC Count: 5.2 10*3/uL (ref 4.0–10.5)
nRBC: 0 % (ref 0.0–0.2)

## 2022-06-30 LAB — CMP (CANCER CENTER ONLY)
ALT: 11 U/L (ref 0–44)
AST: 12 U/L — ABNORMAL LOW (ref 15–41)
Albumin: 4.4 g/dL (ref 3.5–5.0)
Alkaline Phosphatase: 75 U/L (ref 38–126)
Anion gap: 8 (ref 5–15)
BUN: 27 mg/dL — ABNORMAL HIGH (ref 8–23)
CO2: 27 mmol/L (ref 22–32)
Calcium: 9.5 mg/dL (ref 8.9–10.3)
Chloride: 105 mmol/L (ref 98–111)
Creatinine: 0.9 mg/dL (ref 0.44–1.00)
GFR, Estimated: >60 mL/min (ref >60)
Glucose, Bld: 159 mg/dL — ABNORMAL HIGH (ref 70–99)
Potassium: 3.8 mmol/L (ref 3.5–5.1)
Sodium: 140 mmol/L (ref 135–145)
Total Bilirubin: 0.9 mg/dL (ref 0.3–1.2)
Total Protein: 6.2 g/dL — ABNORMAL LOW (ref 6.5–8.1)

## 2022-06-30 LAB — LACTATE DEHYDROGENASE: LDH: 173 U/L (ref 98–192)

## 2022-06-30 MED ORDER — SODIUM CHLORIDE 0.9% FLUSH
10.0000 mL | Freq: Once | INTRAVENOUS | Status: AC
  Administered 2022-06-30: 10 mL

## 2022-06-30 MED ORDER — SODIUM CHLORIDE 0.9% FLUSH
10.0000 mL | INTRAVENOUS | Status: DC | PRN
  Administered 2022-06-30: 10 mL

## 2022-06-30 MED ORDER — HEPARIN SOD (PORK) LOCK FLUSH 100 UNIT/ML IV SOLN
500.0000 [IU] | Freq: Once | INTRAVENOUS | Status: AC | PRN
  Administered 2022-06-30: 500 [IU]

## 2022-06-30 MED ORDER — SODIUM CHLORIDE 0.9 % IV SOLN: Freq: Once | INTRAVENOUS | Status: AC

## 2022-06-30 MED ORDER — DEXTROSE 5 % IV SOLN
70.0000 mg/m2 | Freq: Once | INTRAVENOUS | Status: AC
  Administered 2022-06-30: 130 mg via INTRAVENOUS
  Filled 2022-06-30: qty 60

## 2022-06-30 MED ORDER — SODIUM CHLORIDE 0.9 % IV SOLN: 40.0000 mg | Freq: Once | INTRAVENOUS | Status: DC | PRN

## 2022-06-30 MED ORDER — SODIUM CHLORIDE 0.9 % IV SOLN: Freq: Once | INTRAVENOUS | Status: DC

## 2022-06-30 NOTE — Progress Notes (Signed)
Pt states she took her decadron at home

## 2022-06-30 NOTE — Progress Notes (Signed)
Carol [name] [phone]   [phone]  PROGRESS NOTE  Patient Care Team: [name] as PCP - General (Physician Assistant)  Hematological/Oncological History # Plasmacytoma with Multiple Myeloma  07/29/2021: CT cervical spine shows expansile lytic lesion which almost completely replaces the normal C2 vertebral body  08/24/2021: PET CT scan shows increased metabolic activity at C2 and C7  08/25/2021: biopsy of C2 lesion showed finding consistent with plasma cell neoplasm.  09/08/2021: start radiation therapy 09/12/2021: establish care with Dr. Lorenso Courier  09/28/2021: end radiation therapy 10/10/2021: Bone marrow biopsy. Results confirm plasma cell neoplasm consistent with multiple myeloma, kappa restricted.  10/26/2021: Cycle 1 Day 1 of VRD 11/18/2021: Cycle 2 Day 1 of VRD 11/25/2021: Velcade HELD due to full body rash. Resolved with steroid therapy.  12/16/2021: Cycle 1 Day 1 of Dara/Rev/Dex 12/30/2021: HELD Daratumumab due to toxicity. Patient unable to tolerate. Requested holding the medication  01/06/2022: Cycle 1 Day 1 of Kd  02/03/2022: Cycle 2 Day 1 of Kd 03/03/2022: Cycle 3 Day 1 of Kd 03/31/2022: Cycle 4 Day 1 of Kd 04/28/2022: Cycle 5 Day 1 of Kd 05/26/2022: Cycle 6 Day 1 of Kd  06/30/2022: Cycle 7 Day 1 of Kd   Interval History:  Carol [name] 66 y.o. [name] with medical history significant for multiple myeloma with plasmacytoma who presents for a follow up visit. The patient's last visit was on 06/16/2022.  In the interim since her last visit she has had no major changes of her health.  On exam today Carol [name] she has been well overall interim since her last visit.  She [name] that she is tolerating treatment without difficulty.  She is little bit disappointed by her range of motion after the neck surgery.  She notes that she cannot move side-to-side and that lifting her chin is quite difficult.  She notes that she is not having any  numbness or tingling of her fingers or toes.  She is not having any nausea,, or diarrhea.  Overall she is tolerating treatment and is willing and able to proceed at this time.  She denies fevers, chills, night sweats, shortness of breath, chest pain or cough. She has no other complaints. Rest of the 10 point ROS is below.  MEDICAL HISTORY:  Past Medical History:  Diagnosis Date   Allergy    Anemia    Ankle edema    both ankles   Arthritis    hands,knees,elbows   Cancer ([place]) 2016   tumor kidney   Diabetes mellitus    type 2    Eczema    History of kidney stones    Hypothyroidism    Obesity    Sleep apnea    cpap machine setting of 3   Thyroid disease     SURGICAL HISTORY: Past Surgical History:  Procedure Laterality Date   BREAST BIOPSY Right 2001   CHOLECYSTECTOMY  2009   open   COLONOSCOPY     CYSTOSCOPY W/ RETROGRADES Left 11/22/2018   Procedure: CYSTOSCOPY WITH RETROGRADE PYELOGRAM;  Surgeon: Ardis Hughs, MD;  Location: [facility];  Service: Urology;  Laterality: Left;   CYSTOSCOPY WITH RETROGRADE PYELOGRAM, URETEROSCOPY AND STENT PLACEMENT Right 10/03/2021   Procedure: CYSTOSCOPY WITH RETROGRADE PYELOGRAM, URETEROSCOPY,STONE EXTRACTION AND STENT PLACEMENT;  Surgeon: Ceasar Mons, MD;  Location: [facility];  Service: Urology;  Laterality: Right;   DILATATION & CURRETTAGE/HYSTEROSCOPY WITH RESECTOCOPE N/A 01/21/2013   Procedure: DILATATION & CURETTAGE/HYSTEROSCOPY WITH RESECTOCOPE AND RESECTION OF  ENDOMETRIAL;  Surgeon: Selinda Orion, MD;  Location: [facility];  Service: Gynecology;  Laterality: N/A;   HARDWARE REMOVAL N/A 06/05/2022   Procedure: Removal of Posterior Cervical Hardware from Occiput to Cervical Five;  Surgeon: Judith Part, MD;  Location: [facility];  Service: Neurosurgery;  Laterality: N/A;   IR IMAGING GUIDED PORT INSERTION  02/01/2022   KNEE ARTHROSCOPY     left    POLYPECTOMY     POSTERIOR CERVICAL FUSION/FORAMINOTOMY N/A  08/25/2021   Procedure: Occiput to Cervical 5 Posterior cervical instrumented fusion with open biopsy of Cervical 2 Mass. Cervical Two Ganglionectomy;  Surgeon: Judith Part, MD;  Location: [facility];  Service: Neurosurgery;  Laterality: N/A;   ROBOT ASSISTED PYELOPLASTY Left 11/22/2018   Procedure: XI ROBOTIC ASSISTED ATTEMPTED PYELOPLASTY CONVERTED TO NEPHRECTOMY/LYSIS OF ADHESIONS/UMBILICAL HERNIA REPAIR;  Surgeon: Ardis Hughs, MD;  Location: [facility];  Service: Urology;  Laterality: Left;   ROBOTIC ASSITED PARTIAL NEPHRECTOMY Left 06/11/2015   Procedure: LEFT ROBOTIC ASSISTED LAPAROSCOPIC  PARTIAL NEPHRECTOMY;  Surgeon: Ardis Hughs, MD;  Location: [facility];  Service: Urology;  Laterality: Left;   ROTATOR CUFF REPAIR Left 2011   URETERAL BIOPSY Left 02/14/2017   Procedure: URETERAL BIOPSY;  Surgeon: Ardis Hughs, MD;  Location: [facility];  Service: Urology;  Laterality: Left;   URETEROSCOPY WITH HOLMIUM LASER LITHOTRIPSY Left 02/14/2017   Procedure: URETEROSCOPY LITHOTRIPSY, URETERAL BALLOON DILATION;  Surgeon: Ardis Hughs, MD;  Location: [facility];  Service: Urology;  Laterality: Left;  With STENT    SOCIAL HISTORY: Social History   Socioeconomic History   Marital status: Married    Spouse name: Not on file   Number of children: Not on file   Years of education: Not on file   Highest education level: Not on file  Occupational History   Not on file  Tobacco Use   Smoking status: Never   Smokeless tobacco: Never  Vaping Use   Vaping Use: Never used  Substance and Sexual Activity   Alcohol use: Yes    Comment: occ   Drug use: No   Sexual activity: Not on file  Other Topics Concern   Not on file  Social History Narrative   Not on file   Social Determinants of Health   Financial Resource Strain: Low Risk  (11/22/2018)   Overall Financial Resource Strain (CARDIA)    Difficulty of Paying Living Expenses: Not hard at all  Food Insecurity: Unknown (11/22/2018)    Hunger Vital Sign    Worried About [place] in the Last Year: Patient refused    [place] in the Last Year: Patient refused  Transportation Needs: Unknown (11/22/2018)   PRAPARE - Transportation    Lack of Transportation (Medical): Patient refused    Lack of Transportation (Non-Medical): Patient refused  Physical Activity: Unknown (11/22/2018)   Exercise Vital Sign    Days of Exercise per Week: Patient refused    Minutes of Exercise per Session: Patient refused  Stress: No Stress Concern Present (11/22/2018)   [employer] [place] of Stress : Not at all  Social Connections: Unknown (11/22/2018)   Social Connection and Isolation Panel [NHANES]    Frequency of Communication with Friends and Family: Patient refused    Frequency of Social Gatherings with Friends and Family: Patient refused    Attends Religious Services: Patient refused    Active Member of [employer]  or Organizations: Patient refused    Attends Archivist Meetings: Patient refused    Marital Status: Patient refused  Intimate Partner Violence: Unknown (11/22/2018)   Humiliation, Afraid, Rape, and Kick questionnaire    Fear of Current or Ex-Partner: Patient refused    Emotionally Abused: Patient refused    Physically Abused: Patient refused    Sexually Abused: Patient refused    FAMILY HISTORY: Family History  Problem Relation Age of Onset   Cancer Father     ALLERGIES:  is allergic to morphine.  MEDICATIONS:  Current Outpatient Medications  Medication Sig Dispense Refill   acyclovir (ZOVIRAX) 400 MG tablet TAKE 1 TABLET BY MOUTH TWICE A DAY 180 tablet 1   allopurinol (ZYLOPRIM) 300 MG tablet TAKE 1 TABLET BY MOUTH EVERY DAY 90 tablet 1   Ascorbic Acid (VITAMIN C ADULT GUMMIES PO) Take 1 tablet by mouth daily.     aspirin EC 81 MG tablet Take 1 tablet (81 mg total) by mouth daily. Swallow whole. 90 tablet 3   atorvastatin (LIPITOR)  20 MG tablet Take 20 mg by mouth at bedtime.     dexamethasone (DECADRON) 4 MG tablet Take 40 mg by mouth every Friday.     furosemide (LASIX) 20 MG tablet Take 20 mg by mouth daily.     HEMADY 20 MG TABS TAKE 40 MG BY MOUTH ONCE A WEEK. 2 TABLETS ON DAY OF CHEMO TREATMENT. (Patient not taking: Reported on 05/24/2022) 60 tablet 3   KLOR-CON M20 20 MEQ tablet TAKE 1 TABLET BY MOUTH EVERY DAY 30 tablet 1   levothyroxine (SYNTHROID) 50 MCG tablet Take 50 mcg by mouth daily before breakfast.     lidocaine-prilocaine (EMLA) cream Apply 1 application. topically as needed. 30 g 0   Magnesium Hydroxide (MILK OF MAGNESIA PO) Take 15 mLs by mouth daily as needed (constipation/diarrhea).     ondansetron (ZOFRAN) 8 MG tablet Take 1 tablet (8 mg total) by mouth every 8 (eight) hours as needed. 30 tablet 0   oxyCODONE-acetaminophen (PERCOCET) 5-325 MG tablet Take 1 tablet by mouth every 4 (four) hours as needed (pain). 30 tablet 0   OZEMPIC, 0.25 OR 0.5 MG/DOSE, 2 MG/1.5ML SOPN Inject 0.5 mg into the skin every Saturday.     PROAIR HFA 108 (90 BASE) MCG/ACT inhaler Inhale 2 puffs into the lungs every 4 (four) hours as needed for wheezing or shortness of breath. Uses seasonal  3   prochlorperazine (COMPAZINE) 10 MG tablet Take 1 tablet (10 mg total) by mouth every 6 (six) hours as needed for nausea or vomiting. 30 tablet 0   Sennosides (SENOKOT PO) Take 1 tablet by mouth daily as needed (constipation). Bid     vitamin B-12 (CYANOCOBALAMIN) 500 MCG tablet Take 500 mcg by mouth daily.     Vitamin D, Ergocalciferol, (DRISDOL) 1.25 MG (50000 UNIT) CAPS capsule Take 50,000 Units by mouth once a week.     No current facility-administered medications for this visit.   Facility-Administered Medications Ordered in Other Visits  Medication Dose Route Frequency Provider Last Rate Last Admin   0.9 %  sodium chloride infusion   Intravenous Once Orson Slick, MD       carfilzomib (KYPROLIS) 130 mg in dextrose 5 % 100 mL  chemo infusion  70 mg/m2 (Treatment Plan Recorded) Intravenous Once Orson Slick, MD 330 mL/hr at 06/30/22 0943 130 mg at 06/30/22 0943   dexamethasone (DECADRON) 40 mg in sodium chloride 0.9 % 50  mL IVPB  40 mg Intravenous Once PRN Ledell Peoples IV, MD       heparin lock flush 100 unit/mL  500 Units Intracatheter Once PRN Ledell Peoples IV, MD       sodium chloride flush (NS) 0.9 % injection 10 mL  10 mL Intracatheter PRN Orson Slick, MD        REVIEW OF SYSTEMS:   Constitutional: ( - ) fevers, ( - )  chills , ( - ) night sweats Eyes: ( - ) blurriness of vision, ( - ) double vision, ( - ) watery eyes Ears, nose, mouth, throat, and face: ( - ) mucositis, ( - ) sore throat Respiratory: ( - ) cough, ( - ) dyspnea, ( - ) wheezes Cardiovascular: ( - ) palpitation, ( - ) chest discomfort, ( - ) lower extremity swelling Gastrointestinal:  ( - ) nausea, ( - ) heartburn, ( -) change in bowel habits Skin: ( - ) abnormal skin rashes Lymphatics: ( - ) new lymphadenopathy, ( - ) easy bruising Neurological: ( - ) numbness, ( - ) tingling, ( - ) new weaknesses Behavioral/Psych: ( - ) mood change, ( - ) new changes  All other systems were reviewed with the patient and are negative.  PHYSICAL EXAMINATION: ECOG PERFORMANCE STATUS: 1 - Symptomatic but completely ambulatory  There were no vitals filed for this visit.    There were no vitals filed for this visit.   GENERAL: Well-appearing middle-age Caucasian [name] alert, no distress and comfortable SKIN: No evidence of rash, erythema, dry skin, or raised lesions. EYES: conjunctiva are pink and non-injected, sclera clear LUNGS: clear to auscultation and percussion with normal breathing effort HEART: regular rate & rhythm and no murmurs and no lower extremity edema Musculoskeletal: no cyanosis of digits and no clubbing  PSYCH: alert & oriented x 3, fluent speech NEURO: no focal motor/sensory deficits  LABORATORY DATA:  I have reviewed  the data as listed    Latest Ref Rng & Units 06/30/2022    7:57 AM 06/23/2022    8:14 AM 06/16/2022    9:59 AM  CBC  WBC 4.0 - 10.5 K/uL 5.2  3.1  3.2   Hemoglobin 12.0 - 15.0 g/dL 12.1  11.5  11.8   Hematocrit 36.0 - 46.0 % 33.9  32.7  34.1   Platelets 150 - 400 K/uL 112  165  169        Latest Ref Rng & Units 06/30/2022    7:57 AM 06/23/2022    8:14 AM 06/16/2022    9:59 AM  CMP  Glucose 70 - 99 mg/dL 159  141  129   BUN 8 - 23 mg/dL '27  29  28   ' Creatinine 0.44 - 1.00 mg/dL 0.90  0.78  0.99   Sodium 135 - 145 mmol/L 140  140  141   Potassium 3.5 - 5.1 mmol/L 3.8  3.8  4.0   Chloride 98 - 111 mmol/L 105  108  105   CO2 22 - 32 mmol/L '27  26  31   ' Calcium 8.9 - 10.3 mg/dL 9.5  9.1  9.4   Total Protein 6.5 - 8.1 g/dL 6.2  6.2  6.4   Total Bilirubin 0.3 - 1.2 mg/dL 0.9  0.6  0.7   Alkaline Phos 38 - 126 U/L 75  77  77   AST 15 - 41 U/L '12  13  14   ' ALT 0 - 44 U/L  '11  11  9     ' Lab Results  Component Value Date   MPROTEIN Not Observed 06/16/2022   MPROTEIN Not Observed 05/05/2022   MPROTEIN Not Observed 04/07/2022   Lab Results  Component Value Date   KPAFRELGTCHN 5.2 06/16/2022   KPAFRELGTCHN 3.3 05/05/2022   KPAFRELGTCHN 4.5 04/07/2022   LAMBDASER 3.0 (L) 06/16/2022   LAMBDASER 2.0 (L) 05/05/2022   LAMBDASER 2.2 (L) 04/07/2022   KAPLAMBRATIO 1.73 (H) 06/16/2022   KAPLAMBRATIO 1.65 05/05/2022   KAPLAMBRATIO 2.05 (H) 04/07/2022    RADIOGRAPHIC STUDIES: No results found.  ASSESSMENT & PLAN Carol [name] 65 y.o. [name] with medical history significant for multiple myeloma with plasmacytoma who presents for a follow up visit.   # Free Kappa Multiple Myeloma # Plasmacytoma  -- original finding of L2 plasmacytoma on biopsy of back lesion, bone marrow biopsy confirms greater than 60% plasma cells consistent with multiple myeloma. --Cycle 1 Day 1 of VRD treatment started on 10/26/2021.  --VRD therapy stopped on 11/25/2021 due to rash. Started Dara/Rev/Dex on 12/16/2021.   --will order monthly SPEP, UPEP, SFLC and beta 2 microglobulin --weekly CBC, CMP, and LDH --Cycle 1 Day 1 of Kd started on 01/06/2022 Plan:  --Labs today were reviewed and adequate for treatment today.  --Cycle 7 Day 1 of Kd therapy today.  -- Patient is currently healing well from her surgery, okay to proceed with treatment at this time. --Discussed with patient that we could consider transitioning to a maintenance therapy of Revlimid, though there is concern that Revlimid was the medication causing her difficulties initially.  We will discuss this further at next visit. --RTC in 2 weeks with interval weekly treatment.   #Leukopenia/Thrombocytopenia/Anemia --WBC 5.2, levels oscillate. Hgb 12.1 stable. Platelet count 112 --Likely secondary to chemotherapy --continue to monitor.   #Rash--resolved --Present for approximately one week --Received IV solumedrol and medrol dose pak.  --Suspect drug induced (velcade)  #Thrush, resolved: --Treated with Nystatin mouthwash with improvement.   #Metallic taste/weight loss-improving:  --Encouraged to increase caloric intake and supplement with protein shakes.  #Supportive Care -- chemotherapy education complete  -- port placement complete. -- sent prescription for EMLA cream -- zofran 19m q8H PRN and compazine 143mPO q6H for nausea -- acyclovir 40080mO BID for VCZ prophylaxis -- allopurinol 300m66m daily for TLS prophylaxis -- Started zometa on 02/03/2022. Will continue q 3 months.HOLD zometa in June 2023 due to upcoming spine surgery, will restart sometime thereafter.  -- no pain medication required at this time.   No orders of the defined types were placed in this encounter.  All questions were answered. The patient knows to call the clinic with any problems, questions or concerns.  I have spent a total of 30 minutes minutes of face-to-face and non-face-to-face time, preparing to see the patient, performing a medically appropriate  examination, counseling and educating the patient, ordering medications/tests, documenting clinical information in the electronic health record, and care coordination.   JohnLedell Peoples Department of Hematology/Oncology [facility]: [phone]: [phone]: [email or web address]  06/30/2022 9:57 AM

## 2022-07-07 ENCOUNTER — Other Ambulatory Visit: Payer: Self-pay

## 2022-07-07 ENCOUNTER — Inpatient Hospital Stay: Payer: BC Managed Care – PPO

## 2022-07-07 VITALS — BP 108/57 | HR 76 | Temp 98.0°F | Resp 17 | Wt 159.8 lb

## 2022-07-07 DIAGNOSIS — C9000 Multiple myeloma not having achieved remission: Secondary | ICD-10-CM

## 2022-07-07 DIAGNOSIS — Z95828 Presence of other vascular implants and grafts: Secondary | ICD-10-CM

## 2022-07-07 LAB — CBC WITH DIFFERENTIAL (CANCER CENTER ONLY)
Abs Immature Granulocytes: 0 10*3/uL (ref 0.00–0.07)
Basophils Absolute: 0 10*3/uL (ref 0.0–0.1)
Basophils Relative: 0 %
Eosinophils Absolute: 0 10*3/uL (ref 0.0–0.5)
Eosinophils Relative: 0 %
HCT: 32.1 % — ABNORMAL LOW (ref 36.0–46.0)
Hemoglobin: 11.5 g/dL — ABNORMAL LOW (ref 12.0–15.0)
Immature Granulocytes: 0 %
Lymphocytes Relative: 10 %
Lymphs Abs: 0.3 10*3/uL — ABNORMAL LOW (ref 0.7–4.0)
MCH: 33.9 pg (ref 26.0–34.0)
MCHC: 35.8 g/dL (ref 30.0–36.0)
MCV: 94.7 fL (ref 80.0–100.0)
Monocytes Absolute: 0.1 10*3/uL (ref 0.1–1.0)
Monocytes Relative: 3 %
Neutro Abs: 2.4 10*3/uL (ref 1.7–7.7)
Neutrophils Relative %: 87 %
Platelet Count: 114 10*3/uL — ABNORMAL LOW (ref 150–400)
RBC: 3.39 MIL/uL — ABNORMAL LOW (ref 3.87–5.11)
RDW: 13.5 % (ref 11.5–15.5)
WBC Count: 2.7 10*3/uL — ABNORMAL LOW (ref 4.0–10.5)
nRBC: 0 % (ref 0.0–0.2)

## 2022-07-07 LAB — LACTATE DEHYDROGENASE: LDH: 173 U/L (ref 98–192)

## 2022-07-07 LAB — CMP (CANCER CENTER ONLY)
ALT: 11 U/L (ref 0–44)
AST: 13 U/L — ABNORMAL LOW (ref 15–41)
Albumin: 4.2 g/dL (ref 3.5–5.0)
Alkaline Phosphatase: 59 U/L (ref 38–126)
Anion gap: 3 — ABNORMAL LOW (ref 5–15)
BUN: 25 mg/dL — ABNORMAL HIGH (ref 8–23)
CO2: 26 mmol/L (ref 22–32)
Calcium: 9.4 mg/dL (ref 8.9–10.3)
Chloride: 111 mmol/L (ref 98–111)
Creatinine: 0.8 mg/dL (ref 0.44–1.00)
GFR, Estimated: >60 mL/min (ref >60)
Glucose, Bld: 157 mg/dL — ABNORMAL HIGH (ref 70–99)
Potassium: 4.1 mmol/L (ref 3.5–5.1)
Sodium: 140 mmol/L (ref 135–145)
Total Bilirubin: 0.8 mg/dL (ref 0.3–1.2)
Total Protein: 5.7 g/dL — ABNORMAL LOW (ref 6.5–8.1)

## 2022-07-07 MED ORDER — SODIUM CHLORIDE 0.9 % IV SOLN: Freq: Once | INTRAVENOUS | Status: AC

## 2022-07-07 MED ORDER — DEXTROSE 5 % IV SOLN
70.0000 mg/m2 | Freq: Once | INTRAVENOUS | Status: AC
  Administered 2022-07-07: 130 mg via INTRAVENOUS
  Filled 2022-07-07: qty 60

## 2022-07-07 MED ORDER — SODIUM CHLORIDE 0.9% FLUSH
10.0000 mL | Freq: Once | INTRAVENOUS | Status: AC
  Administered 2022-07-07: 10 mL

## 2022-07-07 MED ORDER — HEPARIN SOD (PORK) LOCK FLUSH 100 UNIT/ML IV SOLN
500.0000 [IU] | Freq: Once | INTRAVENOUS | Status: AC | PRN
  Administered 2022-07-07: 500 [IU]

## 2022-07-07 MED ORDER — SODIUM CHLORIDE 0.9 % IV SOLN: 40.0000 mg | Freq: Once | INTRAVENOUS | Status: DC | PRN

## 2022-07-07 MED ORDER — SODIUM CHLORIDE 0.9% FLUSH
10.0000 mL | INTRAVENOUS | Status: DC | PRN
  Administered 2022-07-07: 10 mL

## 2022-07-09 ENCOUNTER — Other Ambulatory Visit: Payer: Self-pay | Admitting: Hematology and Oncology

## 2022-07-14 ENCOUNTER — Other Ambulatory Visit: Payer: Self-pay

## 2022-07-19 ENCOUNTER — Other Ambulatory Visit: Payer: Self-pay | Admitting: Hematology and Oncology

## 2022-07-20 ENCOUNTER — Inpatient Hospital Stay: Payer: BC Managed Care – PPO | Admitting: Hematology and Oncology

## 2022-07-20 ENCOUNTER — Inpatient Hospital Stay: Payer: BC Managed Care – PPO

## 2022-07-20 ENCOUNTER — Other Ambulatory Visit: Payer: Self-pay

## 2022-07-20 VITALS — BP 131/61 | HR 80 | Temp 98.1°F | Resp 14 | Ht 62.0 in | Wt 165.2 lb

## 2022-07-20 DIAGNOSIS — C9000 Multiple myeloma not having achieved remission: Secondary | ICD-10-CM

## 2022-07-20 DIAGNOSIS — Z95828 Presence of other vascular implants and grafts: Secondary | ICD-10-CM

## 2022-07-20 LAB — CMP (CANCER CENTER ONLY)
ALT: 11 U/L (ref 0–44)
AST: 14 U/L — ABNORMAL LOW (ref 15–41)
Albumin: 4.1 g/dL (ref 3.5–5.0)
Alkaline Phosphatase: 75 U/L (ref 38–126)
Anion gap: 5 (ref 5–15)
BUN: 23 mg/dL (ref 8–23)
CO2: 26 mmol/L (ref 22–32)
Calcium: 9.1 mg/dL (ref 8.9–10.3)
Chloride: 111 mmol/L (ref 98–111)
Creatinine: 0.75 mg/dL (ref 0.44–1.00)
GFR, Estimated: >60 mL/min (ref >60)
Glucose, Bld: 188 mg/dL — ABNORMAL HIGH (ref 70–99)
Potassium: 4.1 mmol/L (ref 3.5–5.1)
Sodium: 142 mmol/L (ref 135–145)
Total Bilirubin: 0.6 mg/dL (ref 0.3–1.2)
Total Protein: 5.9 g/dL — ABNORMAL LOW (ref 6.5–8.1)

## 2022-07-20 LAB — CBC WITH DIFFERENTIAL (CANCER CENTER ONLY)
Abs Immature Granulocytes: 0.01 10*3/uL (ref 0.00–0.07)
Basophils Absolute: 0 10*3/uL (ref 0.0–0.1)
Basophils Relative: 0 %
Eosinophils Absolute: 0 10*3/uL (ref 0.0–0.5)
Eosinophils Relative: 0 %
HCT: 33 % — ABNORMAL LOW (ref 36.0–46.0)
Hemoglobin: 11.5 g/dL — ABNORMAL LOW (ref 12.0–15.0)
Immature Granulocytes: 0 %
Lymphocytes Relative: 10 %
Lymphs Abs: 0.3 10*3/uL — ABNORMAL LOW (ref 0.7–4.0)
MCH: 33.7 pg (ref 26.0–34.0)
MCHC: 34.8 g/dL (ref 30.0–36.0)
MCV: 96.8 fL (ref 80.0–100.0)
Monocytes Absolute: 0.1 10*3/uL (ref 0.1–1.0)
Monocytes Relative: 2 %
Neutro Abs: 2.4 10*3/uL (ref 1.7–7.7)
Neutrophils Relative %: 88 %
Platelet Count: 178 10*3/uL (ref 150–400)
RBC: 3.41 MIL/uL — ABNORMAL LOW (ref 3.87–5.11)
RDW: 13.8 % (ref 11.5–15.5)
WBC Count: 2.8 10*3/uL — ABNORMAL LOW (ref 4.0–10.5)
nRBC: 0 % (ref 0.0–0.2)

## 2022-07-20 LAB — LACTATE DEHYDROGENASE: LDH: 183 U/L (ref 98–192)

## 2022-07-20 MED ORDER — DEXTROSE 5 % IV SOLN
70.0000 mg/m2 | Freq: Once | INTRAVENOUS | Status: AC
  Administered 2022-07-20: 130 mg via INTRAVENOUS
  Filled 2022-07-20: qty 5

## 2022-07-20 MED ORDER — SODIUM CHLORIDE 0.9% FLUSH
10.0000 mL | INTRAVENOUS | Status: DC | PRN
  Administered 2022-07-20: 10 mL

## 2022-07-20 MED ORDER — SODIUM CHLORIDE 0.9 % IV SOLN: Freq: Once | INTRAVENOUS | Status: AC

## 2022-07-20 MED ORDER — SODIUM CHLORIDE 0.9% FLUSH
10.0000 mL | Freq: Once | INTRAVENOUS | Status: AC
  Administered 2022-07-20: 10 mL

## 2022-07-20 MED ORDER — HEPARIN SOD (PORK) LOCK FLUSH 100 UNIT/ML IV SOLN
500.0000 [IU] | Freq: Once | INTRAVENOUS | Status: AC | PRN
  Administered 2022-07-20: 500 [IU]

## 2022-07-20 NOTE — Progress Notes (Signed)
Patient reports she has taken dexamethasone '40mg'$  PO at home prior to arrival.

## 2022-07-20 NOTE — Progress Notes (Signed)
[place] [phone]   [phone]  PROGRESS NOTE  Patient Care Team: [name] as PCP - General (Physician Assistant)  Hematological/Oncological History # Plasmacytoma with Multiple Myeloma  07/29/2021: CT cervical spine shows expansile lytic lesion which almost completely replaces the normal C2 vertebral body  08/24/2021: PET CT scan shows increased metabolic activity at C2 and C7  08/25/2021: biopsy of C2 lesion showed finding consistent with plasma cell neoplasm.  09/08/2021: start radiation therapy 09/12/2021: establish care with Dr. Lorenso Courier  09/28/2021: end radiation therapy 10/10/2021: Bone marrow biopsy. Results confirm plasma cell neoplasm consistent with multiple myeloma, kappa restricted.  10/26/2021: Cycle 1 Day 1 of VRD 11/18/2021: Cycle 2 Day 1 of VRD 11/25/2021: Velcade HELD due to full body rash. Resolved with steroid therapy.  12/16/2021: Cycle 1 Day 1 of Dara/Rev/Dex 12/30/2021: HELD Daratumumab due to toxicity. Patient unable to tolerate. Requested holding the medication  01/06/2022: Cycle 1 Day 1 of Kd  02/03/2022: Cycle 2 Day 1 of Kd 03/03/2022: Cycle 3 Day 1 of Kd 03/31/2022: Cycle 4 Day 1 of Kd 04/28/2022: Cycle 5 Day 1 of Kd 05/26/2022: Cycle 6 Day 1 of Kd  06/30/2022: Cycle 7 Day 1 of Kd  07/20/2022: Cycle 8 Day 1 of Kd   Interval History:  Carol [name] 66 y.o. female with medical history significant for multiple myeloma with plasmacytoma who presents for a follow up visit. The patient's last visit was on 06/30/2022.  In the interim since her last visit she has had no major changes of her health.  On exam today Carol [name] reports she has been well in the interim since her last visit.  She enjoyed her week off.  She notes that she is looking forward to starting maintenance therapy.  She reports that she has not been having any issues with numbness and tingling of her fingers and toes.  Overall she "feels great".  She notes that  she is concerned about her cataracts and they are to be operated on 09/14/2022.  She has had some alternating diarrhea and constipation has gone back and forth between the 2.  She prefers the diarrhea as the constipation is much more burdensome.  She continues to take her aspirin 81 mg p.o. daily without any bleeding or bruising.  She denies fevers, chills, night sweats, shortness of breath, chest pain or cough. She has no other complaints. Rest of the 10 point ROS is below.  MEDICAL HISTORY:  Past Medical History:  Diagnosis Date   Allergy    Anemia    Ankle edema    both ankles   Arthritis    hands,knees,elbows   Cancer ([place]) 2016   tumor kidney   Diabetes mellitus    type 2    Eczema    History of kidney stones    Hypothyroidism    Obesity    Sleep apnea    cpap machine setting of 3   Thyroid disease     SURGICAL HISTORY: Past Surgical History:  Procedure Laterality Date   BREAST BIOPSY Right 2001   CHOLECYSTECTOMY  2009   open   COLONOSCOPY     CYSTOSCOPY W/ RETROGRADES Left 11/22/2018   Procedure: CYSTOSCOPY WITH RETROGRADE PYELOGRAM;  Surgeon: Ardis Hughs, MD;  Location: [facility];  Service: Urology;  Laterality: Left;   CYSTOSCOPY WITH RETROGRADE PYELOGRAM, URETEROSCOPY AND STENT PLACEMENT Right 10/03/2021   Procedure: CYSTOSCOPY WITH RETROGRADE PYELOGRAM, URETEROSCOPY,STONE EXTRACTION AND STENT PLACEMENT;  Surgeon: Ellison Hughs  Marjory Lies, MD;  Location: [facility];  Service: Urology;  Laterality: Right;   DILATATION & CURRETTAGE/HYSTEROSCOPY WITH RESECTOCOPE N/A 01/21/2013   Procedure: DILATATION & CURETTAGE/HYSTEROSCOPY WITH RESECTOCOPE AND RESECTION OF ENDOMETRIAL;  Surgeon: Selinda Orion, MD;  Location: [facility];  Service: Gynecology;  Laterality: N/A;   HARDWARE REMOVAL N/A 06/05/2022   Procedure: Removal of Posterior Cervical Hardware from Occiput to Cervical Five;  Surgeon: Judith Part, MD;  Location: [facility];  Service: Neurosurgery;   Laterality: N/A;   IR IMAGING GUIDED PORT INSERTION  02/01/2022   KNEE ARTHROSCOPY     left    POLYPECTOMY     POSTERIOR CERVICAL FUSION/FORAMINOTOMY N/A 08/25/2021   Procedure: Occiput to Cervical 5 Posterior cervical instrumented fusion with open biopsy of Cervical 2 Mass. Cervical Two Ganglionectomy;  Surgeon: Judith Part, MD;  Location: [facility];  Service: Neurosurgery;  Laterality: N/A;   ROBOT ASSISTED PYELOPLASTY Left 11/22/2018   Procedure: XI ROBOTIC ASSISTED ATTEMPTED PYELOPLASTY CONVERTED TO NEPHRECTOMY/LYSIS OF ADHESIONS/UMBILICAL HERNIA REPAIR;  Surgeon: Ardis Hughs, MD;  Location: [facility];  Service: Urology;  Laterality: Left;   ROBOTIC ASSITED PARTIAL NEPHRECTOMY Left 06/11/2015   Procedure: LEFT ROBOTIC ASSISTED LAPAROSCOPIC  PARTIAL NEPHRECTOMY;  Surgeon: Ardis Hughs, MD;  Location: [facility];  Service: Urology;  Laterality: Left;   ROTATOR CUFF REPAIR Left 2011   URETERAL BIOPSY Left 02/14/2017   Procedure: URETERAL BIOPSY;  Surgeon: Ardis Hughs, MD;  [facility];  Service: Urology;  Laterality: Left;   URETEROSCOPY WITH HOLMIUM LASER LITHOTRIPSY Left 02/14/2017   Procedure: URETEROSCOPY LITHOTRIPSY, URETERAL BALLOON DILATION;  Surgeon: Ardis Hughs, MD;  Location[facility];  Service: Urology;  Laterality: Left;  With STENT    SOCIAL HISTORY: Social History   Socioeconomic History   Marital status: Married    Spouse name: Not on file   Number of children: Not on file   Years of education: Not on file   Highest education level: Not on file  Occupational History   Not on file  Tobacco Use   Smoking status: Never   Smokeless tobacco: Never  Vaping Use   Vaping Use: Never used  Substance and Sexual Activity   Alcohol use: Yes    Comment: occ   Drug use: No   Sexual activity: Not on file  Other Topics Concern   Not on file  Social History Narrative   Not on file   Social Determinants of Health   Financial Resource Strain: Low Risk   (11/22/2018)   Overall Financial Resource Strain (CARDIA)    Difficulty of Paying Living Expenses: Not hard at all  Food Insecurity: Unknown (11/22/2018)   Hunger Vital Sign    Worried About [place] in the Last Year: Patient refused    [place] in the Last Year: Patient refused  Transportation Needs: Unknown (11/22/2018)   PRAPARE - Transportation    Lack of Transportation (Medical): Patient refused    Lack of Transportation (Non-Medical): Patient refused  Physical Activity: Unknown (11/22/2018)   Exercise Vital Sign    Days of Exercise per Week: Patient refused    Minutes of Exercise per Session: Patient refused  Stress: No Stress Concern Present (11/22/2018)   [employer] [place] of Stress : Not at all  Social Connections: Unknown (11/22/2018)   Social Connection and Isolation Panel [NHANES]    Frequency of Communication with Friends  and Family: Patient refused    Frequency of Social Gatherings with Friends and Family: Patient refused    Attends Religious Services: Patient refused    Active Member of Clubs or Organizations: Patient refused    Attends Archivist Meetings: Patient refused    Marital Status: Patient refused  Intimate Partner Violence: Unknown (11/22/2018)   Humiliation, Afraid, Rape, and Kick questionnaire    Fear of Current or Ex-Partner: Patient refused    Emotionally Abused: Patient refused    Physically Abused: Patient refused    Sexually Abused: Patient refused    FAMILY HISTORY: Family History  Problem Relation Age of Onset   Cancer Father     ALLERGIES:  is allergic to morphine.  MEDICATIONS:  Current Outpatient Medications  Medication Sig Dispense Refill   acyclovir (ZOVIRAX) 400 MG tablet TAKE 1 TABLET BY MOUTH TWICE A DAY 180 tablet 1   allopurinol (ZYLOPRIM) 300 MG tablet TAKE 1 TABLET BY MOUTH EVERY DAY 90 tablet 1   Ascorbic Acid (VITAMIN C ADULT GUMMIES PO)  Take 1 tablet by mouth daily.     atorvastatin (LIPITOR) 20 MG tablet Take 20 mg by mouth at bedtime.     [employer] ASPIRIN LOW DOSE 81 MG tablet TAKE 1 TABLET (81 MG TOTAL) BY MOUTH DAILY. SWALLOW WHOLE. 90 tablet 3   dexamethasone (DECADRON) 4 MG tablet Take 40 mg by mouth every Friday.     furosemide (LASIX) 20 MG tablet Take 20 mg by mouth daily.     HEMADY 20 MG TABS TAKE 40 MG BY MOUTH ONCE A WEEK. 2 TABLETS ON DAY OF CHEMO TREATMENT. 60 tablet 3   KLOR-CON M20 20 MEQ tablet TAKE 1 TABLET BY MOUTH EVERY DAY 30 tablet 1   levothyroxine (SYNTHROID) 50 MCG tablet Take 50 mcg by mouth daily before breakfast.     lidocaine-prilocaine (EMLA) cream Apply 1 application. topically as needed. 30 g 0   Magnesium Hydroxide (MILK OF MAGNESIA PO) Take 15 mLs by mouth daily as needed (constipation/diarrhea).     ondansetron (ZOFRAN) 8 MG tablet Take 1 tablet (8 mg total) by mouth every 8 (eight) hours as needed. 30 tablet 0   oxyCODONE-acetaminophen (PERCOCET) 5-325 MG tablet Take 1 tablet by mouth every 4 (four) hours as needed (pain). 30 tablet 0   OZEMPIC, 0.25 OR 0.5 MG/DOSE, 2 MG/1.5ML SOPN Inject 0.5 mg into the skin every Saturday.     PROAIR HFA 108 (90 BASE) MCG/ACT inhaler Inhale 2 puffs into the lungs every 4 (four) hours as needed for wheezing or shortness of breath. Uses seasonal  3   prochlorperazine (COMPAZINE) 10 MG tablet Take 1 tablet (10 mg total) by mouth every 6 (six) hours as needed for nausea or vomiting. 30 tablet 0   Sennosides (SENOKOT PO) Take 1 tablet by mouth daily as needed (constipation). Bid     vitamin B-12 (CYANOCOBALAMIN) 500 MCG tablet Take 500 mcg by mouth daily.     Vitamin D, Ergocalciferol, (DRISDOL) 1.25 MG (50000 UNIT) CAPS capsule Take 50,000 Units by mouth once a week.     No current facility-administered medications for this visit.    REVIEW OF SYSTEMS:   Constitutional: ( - ) fevers, ( - )  chills , ( - ) night sweats Eyes: ( - ) blurriness of vision, ( - )  double vision, ( - ) watery eyes Ears, nose, mouth, throat, and face: ( - ) mucositis, ( - ) sore throat Respiratory: ( - )  cough, ( - ) dyspnea, ( - ) wheezes Cardiovascular: ( - ) palpitation, ( - ) chest discomfort, ( - ) lower extremity swelling Gastrointestinal:  ( - ) nausea, ( - ) heartburn, ( -) change in bowel habits Skin: ( - ) abnormal skin rashes Lymphatics: ( - ) new lymphadenopathy, ( - ) easy bruising Neurological: ( - ) numbness, ( - ) tingling, ( - ) new weaknesses Behavioral/Psych: ( - ) mood change, ( - ) new changes  All other systems were reviewed with the patient and are negative.  PHYSICAL EXAMINATION: ECOG PERFORMANCE STATUS: 1 - Symptomatic but completely ambulatory  Vitals:   07/20/22 0822  BP: 131/61  Pulse: 80  Resp: 14  Temp: 98.1 F (36.7 C)  SpO2: 100%      Filed Weights   07/20/22 0822  Weight: 165 lb 3.2 oz (74.9 kg)     GENERAL: Well-appearing middle-age Caucasian female alert, no distress and comfortable SKIN: No evidence of rash, erythema, dry skin, or raised lesions. EYES: conjunctiva are pink and non-injected, sclera clear LUNGS: clear to auscultation and percussion with normal breathing effort HEART: regular rate & rhythm and no murmurs and no lower extremity edema Musculoskeletal: no cyanosis of digits and no clubbing  PSYCH: alert & oriented x 3, fluent speech NEURO: no focal motor/sensory deficits  LABORATORY DATA:  I have reviewed the data as listed    Latest Ref Rng & Units 07/20/2022    8:01 AM 07/07/2022    8:25 AM 06/30/2022    7:57 AM  CBC  WBC 4.0 - 10.5 K/uL 2.8  2.7  5.2   Hemoglobin 12.0 - 15.0 g/dL 11.5  11.5  12.1   Hematocrit 36.0 - 46.0 % 33.0  32.1  33.9   Platelets 150 - 400 K/uL 178  114  112        Latest Ref Rng & Units 07/20/2022    8:01 AM 07/07/2022    8:25 AM 06/30/2022    7:57 AM  CMP  Glucose 70 - 99 mg/dL 188  157  159   BUN 8 - 23 mg/dL '23  25  27   ' Creatinine 0.44 - 1.00 mg/dL 0.75  0.80   0.90   Sodium 135 - 145 mmol/L 142  140  140   Potassium 3.5 - 5.1 mmol/L 4.1  4.1  3.8   Chloride 98 - 111 mmol/L 111  111  105   CO2 22 - 32 mmol/L '26  26  27   ' Calcium 8.9 - 10.3 mg/dL 9.1  9.4  9.5   Total Protein 6.5 - 8.1 g/dL 5.9  5.7  6.2   Total Bilirubin 0.3 - 1.2 mg/dL 0.6  0.8  0.9   Alkaline Phos 38 - 126 U/L 75  59  75   AST 15 - 41 U/L '14  13  12   ' ALT 0 - 44 U/L '11  11  11     ' Lab Results  Component Value Date   MPROTEIN Not Observed 06/16/2022   MPROTEIN Not Observed 05/05/2022   MPROTEIN Not Observed 04/07/2022   Lab Results  Component Value Date   KPAFRELGTCHN 5.2 06/16/2022   KPAFRELGTCHN 3.3 05/05/2022   KPAFRELGTCHN 4.5 04/07/2022   LAMBDASER 3.0 (L) 06/16/2022   LAMBDASER 2.0 (L) 05/05/2022   LAMBDASER 2.2 (L) 04/07/2022   KAPLAMBRATIO 1.73 (H) 06/16/2022   KAPLAMBRATIO 1.65 05/05/2022   KAPLAMBRATIO 2.05 (H) 04/07/2022    RADIOGRAPHIC STUDIES: No results found.  ASSESSMENT & PLAN Carol [name] 66 y.o. female with medical history significant for multiple myeloma with plasmacytoma who presents for a follow up visit.   # Free Kappa Multiple Myeloma # Plasmacytoma  -- original finding of L2 plasmacytoma on biopsy of back lesion, bone marrow biopsy confirms greater than 60% plasma cells consistent with multiple myeloma. --Cycle 1 Day 1 of VRD treatment started on 10/26/2021.  --VRD therapy stopped on 11/25/2021 due to rash. Started Dara/Rev/Dex on 12/16/2021.  --will order monthly SPEP, UPEP, SFLC and beta 2 microglobulin --weekly CBC, CMP, and LDH --Cycle 1 Day 1 of Kd started on 01/06/2022 Plan:  --Labs today were reviewed and adequate for treatment today.  --Cycle 8 Day 1 of Kd therapy today.  -- Patient is currently healing well from her surgery, okay to proceed with treatment at this time. --Discussed with patient that we could consider transitioning to a maintenance therapy of Revlimid, though there is concern that Revlimid was the medication  causing her difficulties initially.  Patient would like to proceed with maintenance Revlimid.  We will plan to have the start at the next visit in 2 weeks. --RTC in 2 weeks with interval weekly treatment.   #Leukopenia/Thrombocytopenia/Anemia --WBC 2.8, levels oscillate. Hgb 11.5 stable. Platelet count 178 --Likely secondary to chemotherapy --continue to monitor.   #Rash--resolved --Present for approximately one week --Received IV solumedrol and medrol dose pak.  --Suspect drug induced (velcade)  #Thrush, resolved: --Treated with Nystatin mouthwash with improvement.   #Metallic taste/weight loss-improving:  --Encouraged to increase caloric intake and supplement with protein shakes.  #Supportive Care -- chemotherapy education complete  -- port placement complete. -- sent prescription for EMLA cream -- zofran 56m q8H PRN and compazine 141mPO q6H for nausea -- acyclovir 40035mO BID for VCZ prophylaxis. Can d/c approximately 4 weeks after last dose of Kyprolis.  -- allopurinol OK to d/c at this time.  -- Started zometa on 02/03/2022. Will continue q 3 months.HOLD zometa in June 2023 due to upcoming spine surgery, will restart in Sept 2023.  -- no pain medication required at this time.   No orders of the defined types were placed in this encounter.  All questions were answered. The patient knows to call the clinic with any problems, questions or concerns.  I have spent a total of 30 minutes minutes of face-to-face and non-face-to-face time, preparing to see the patient, performing a medically appropriate examination, counseling and educating the patient, ordering medications/tests, documenting clinical information in the electronic health record, and care coordination.   JohLedell PeoplesD Department of Hematology/Oncology [place] [facility] LLCone: [phone]: [phone]: [email or web address]  07/20/2022 4:28 PM

## 2022-07-20 NOTE — Patient Instructions (Signed)
[facility]COLOG[facility]  Discharge Instructions: Thank you for choosing [facility] to provide your oncology and hematology care.   If you have a lab appointment with the Cancer Center, please go directly to the Cancer Center and check in at the registration area.   Wear comfortable clothing and clothing appropriate for easy access to any Portacath or PICC line.   We strive to give you quality time with your provider. You may need to reschedule your appointment if you arrive late (15 or more minutes).  Arriving late affects you and other patients whose appointments are after yours.  Also, if you miss three or more appointments without notifying the office, you may be dismissed from the clinic at the provider's discretion.      For prescription refill requests, have your pharmacy contact our office and allow 72 hours for refills to be completed.    Today you received the following chemotherapy and/or immunotherapy agents: carfilzomib      To help prevent nausea and vomiting after your treatment, we encourage you to take your nausea medication as directed.  BELOW ARE SYMPTOMS THAT SHOULD BE REPORTED IMMEDIATELY: *FEVER GREATER THAN 100.4 F (38 C) OR HIGHER *CHILLS OR SWEATING *NAUSEA AND VOMITING THAT IS NOT CONTROLLED WITH YOUR NAUSEA MEDICATION *UNUSUAL SHORTNESS OF BREATH *UNUSUAL BRUISING OR BLEEDING *URINARY PROBLEMS (pain or burning when urinating, or frequent urination) *BOWEL PROBLEMS (unusual diarrhea, constipation, pain near the anus) TENDERNESS IN MOUTH AND THROAT WITH OR WITHOUT PRESENCE OF ULCERS (sore throat, sores in mouth, or a toothache) UNUSUAL RASH, SWELLING OR PAIN  UNUSUAL VAGINAL DISCHARGE OR ITCHING   Items with * indicate a potential emergency and should be followed up as soon as possible or go to the Emergency Department if any problems should occur.  Please show the CHEMOTHERAPY ALERT CARD or IMMUNOTHERAPY ALERT CARD at check-in  to the Emergency Department and triage nurse.  Should you have questions after your visit or need to cancel or reschedule your appointment, please contact [facility]  Dept: [phone]  and follow the prompts.  Office hours are 8:00 a.m. to 4:30 p.m. Monday - Friday. Please note that voicemails left after 4:00 p.m. may not be returned until the following business day.  We are closed weekends and major holidays. You have access to a nurse at all times for urgent questions. Please call the main number to the clinic Dept: [phone] and follow the prompts.   For any non-urgent questions, you may also contact your provider using MyChart. We now offer e-Visits for anyone 18 and older to request care online for non-urgent symptoms. For details visit [email or web address].   Also download the MyChart app! Go to the app store, search "MyChart", open the app, select [facility], and log in with your MyChart username and password.  Masks are optional in the cancer centers. If you would like for your care team to wear a mask while they are taking care of you, please let them know. You may have one support person who is at least 67 years old accompany you for your appointments. 

## 2022-07-21 ENCOUNTER — Other Ambulatory Visit: Payer: Self-pay

## 2022-07-21 LAB — KAPPA/LAMBDA LIGHT CHAINS
Kappa free light chain: 3.6 mg/L (ref 3.3–19.4)
Kappa, lambda light chain ratio: 1.16 (ref 0.26–1.65)
Lambda free light chains: 3.1 mg/L — ABNORMAL LOW (ref 5.7–26.3)

## 2022-07-23 ENCOUNTER — Other Ambulatory Visit: Payer: Self-pay | Admitting: Hematology and Oncology

## 2022-07-23 DIAGNOSIS — C9000 Multiple myeloma not having achieved remission: Secondary | ICD-10-CM

## 2022-07-23 NOTE — Progress Notes (Signed)
OFF PATHWAY REGIMEN - Multiple Myeloma and Other Plasma Cell Dyscrasias  No Change  Continue With Treatment as Ordered.  Original Decision Date/Time: 01/03/2022 17:20   [id number] - Weekly (Carfilzomib 20/70 mg/m2 + Dexamethasone 40 mg) q28 Days:   A cycle is every 28 days:     Carfilzomib      Carfilzomib      Carfilzomib      Dexamethasone      Dexamethasone   **Always confirm dose/schedule in your pharmacy ordering system**  Patient Characteristics: Multiple Myeloma, Newly Diagnosed, Transplant Ineligible or Refused, Standard Risk Disease Classification: Multiple Myeloma R-ISS Staging: II Therapeutic Status: Newly Diagnosed Is Patient Eligible for Transplant<= Transplant Ineligible or Refused Risk Status: Standard Risk Intent of Therapy: Curative Intent, Discussed with Patient

## 2022-07-25 LAB — MULTIPLE MYELOMA PANEL, SERUM
Albumin SerPl Elph-Mcnc: 3.5 g/dL (ref 2.9–4.4)
Albumin/Glob SerPl: 2 — ABNORMAL HIGH (ref 0.7–1.7)
Alpha 1: 0.2 g/dL (ref 0.0–0.4)
Alpha2 Glob SerPl Elph-Mcnc: 0.6 g/dL (ref 0.4–1.0)
B-Globulin SerPl Elph-Mcnc: 0.8 g/dL (ref 0.7–1.3)
Gamma Glob SerPl Elph-Mcnc: 0.2 g/dL — ABNORMAL LOW (ref 0.4–1.8)
Globulin, Total: 1.8 g/dL — ABNORMAL LOW (ref 2.2–3.9)
IgA: 7 mg/dL — ABNORMAL LOW (ref 87–352)
IgG (Immunoglobin G), Serum: 131 mg/dL — ABNORMAL LOW (ref 586–1602)
IgM (Immunoglobulin M), Srm: 6 mg/dL — ABNORMAL LOW (ref 26–217)
Total Protein ELP: 5.3 g/dL — ABNORMAL LOW (ref 6.0–8.5)

## 2022-07-26 ENCOUNTER — Other Ambulatory Visit: Payer: Self-pay | Admitting: *Deleted

## 2022-07-26 ENCOUNTER — Encounter: Payer: Self-pay | Admitting: *Deleted

## 2022-07-26 MED ORDER — LENALIDOMIDE 10 MG PO CAPS: 10.0000 mg | ORAL_CAPSULE | Freq: Every day | ORAL | 0 refills | Status: DC

## 2022-07-28 ENCOUNTER — Inpatient Hospital Stay: Payer: BC Managed Care – PPO | Attending: Radiation Oncology

## 2022-07-28 ENCOUNTER — Inpatient Hospital Stay: Payer: BC Managed Care – PPO

## 2022-07-28 ENCOUNTER — Other Ambulatory Visit: Payer: Self-pay

## 2022-07-28 ENCOUNTER — Inpatient Hospital Stay: Payer: BC Managed Care – PPO | Admitting: Dietician

## 2022-07-28 VITALS — BP 112/60 | HR 78 | Temp 98.0°F | Resp 16 | Wt 166.5 lb

## 2022-07-28 DIAGNOSIS — D72819 Decreased white blood cell count, unspecified: Secondary | ICD-10-CM | POA: Insufficient documentation

## 2022-07-28 DIAGNOSIS — C9000 Multiple myeloma not having achieved remission: Secondary | ICD-10-CM | POA: Insufficient documentation

## 2022-07-28 DIAGNOSIS — Z95828 Presence of other vascular implants and grafts: Secondary | ICD-10-CM

## 2022-07-28 DIAGNOSIS — Z5112 Encounter for antineoplastic immunotherapy: Secondary | ICD-10-CM | POA: Diagnosis present

## 2022-07-28 DIAGNOSIS — Z79899 Other long term (current) drug therapy: Secondary | ICD-10-CM | POA: Diagnosis not present

## 2022-07-28 LAB — CBC WITH DIFFERENTIAL (CANCER CENTER ONLY)
Abs Immature Granulocytes: 0.02 10*3/uL (ref 0.00–0.07)
Basophils Absolute: 0 10*3/uL (ref 0.0–0.1)
Basophils Relative: 0 %
Eosinophils Absolute: 0 10*3/uL (ref 0.0–0.5)
Eosinophils Relative: 0 %
HCT: 33 % — ABNORMAL LOW (ref 36.0–46.0)
Hemoglobin: 11.8 g/dL — ABNORMAL LOW (ref 12.0–15.0)
Immature Granulocytes: 1 %
Lymphocytes Relative: 10 %
Lymphs Abs: 0.3 10*3/uL — ABNORMAL LOW (ref 0.7–4.0)
MCH: 33.4 pg (ref 26.0–34.0)
MCHC: 35.8 g/dL (ref 30.0–36.0)
MCV: 93.5 fL (ref 80.0–100.0)
Monocytes Absolute: 0.1 10*3/uL (ref 0.1–1.0)
Monocytes Relative: 3 %
Neutro Abs: 2.8 10*3/uL (ref 1.7–7.7)
Neutrophils Relative %: 86 %
Platelet Count: 113 10*3/uL — ABNORMAL LOW (ref 150–400)
RBC: 3.53 MIL/uL — ABNORMAL LOW (ref 3.87–5.11)
RDW: 13.1 % (ref 11.5–15.5)
WBC Count: 3.2 10*3/uL — ABNORMAL LOW (ref 4.0–10.5)
nRBC: 0 % (ref 0.0–0.2)

## 2022-07-28 LAB — CMP (CANCER CENTER ONLY)
ALT: 14 U/L (ref 0–44)
AST: 14 U/L — ABNORMAL LOW (ref 15–41)
Albumin: 4 g/dL (ref 3.5–5.0)
Alkaline Phosphatase: 76 U/L (ref 38–126)
Anion gap: 7 (ref 5–15)
BUN: 26 mg/dL — ABNORMAL HIGH (ref 8–23)
CO2: 24 mmol/L (ref 22–32)
Calcium: 8.9 mg/dL (ref 8.9–10.3)
Chloride: 109 mmol/L (ref 98–111)
Creatinine: 0.83 mg/dL (ref 0.44–1.00)
GFR, Estimated: >60 mL/min (ref >60)
Glucose, Bld: 158 mg/dL — ABNORMAL HIGH (ref 70–99)
Potassium: 4.3 mmol/L (ref 3.5–5.1)
Sodium: 140 mmol/L (ref 135–145)
Total Bilirubin: 0.6 mg/dL (ref 0.3–1.2)
Total Protein: 5.8 g/dL — ABNORMAL LOW (ref 6.5–8.1)

## 2022-07-28 MED ORDER — SODIUM CHLORIDE 0.9 % IV SOLN: 40.0000 mg | Freq: Once | INTRAVENOUS | Status: DC

## 2022-07-28 MED ORDER — HEPARIN SOD (PORK) LOCK FLUSH 100 UNIT/ML IV SOLN
500.0000 [IU] | Freq: Once | INTRAVENOUS | Status: AC | PRN
  Administered 2022-07-28: 500 [IU]

## 2022-07-28 MED ORDER — SODIUM CHLORIDE 0.9% FLUSH
10.0000 mL | INTRAVENOUS | Status: DC | PRN
  Administered 2022-07-28: 10 mL

## 2022-07-28 MED ORDER — SODIUM CHLORIDE 0.9% FLUSH
10.0000 mL | Freq: Once | INTRAVENOUS | Status: AC | PRN
  Administered 2022-07-28: 10 mL

## 2022-07-28 MED ORDER — SODIUM CHLORIDE 0.9 % IV SOLN: Freq: Once | INTRAVENOUS | Status: AC

## 2022-07-28 MED ORDER — DEXTROSE 5 % IV SOLN
70.0000 mg/m2 | Freq: Once | INTRAVENOUS | Status: AC
  Administered 2022-07-28: 130 mg via INTRAVENOUS
  Filled 2022-07-28: qty 60

## 2022-07-28 MED ORDER — ZOLEDRONIC ACID 4 MG/100ML IV SOLN
4.0000 mg | Freq: Once | INTRAVENOUS | Status: AC
  Administered 2022-07-28: 4 mg via INTRAVENOUS
  Filled 2022-07-28: qty 100

## 2022-07-28 MED ORDER — SODIUM CHLORIDE 0.9 % IV SOLN: Freq: Once | INTRAVENOUS | Status: DC

## 2022-07-28 NOTE — Progress Notes (Signed)
Nutrition Follow-up:  Patient with multiple myeloma with plasmacytoma. She is currently Kyprolis (started 2/17).   Met with patient during infusion. She reports this is her final Kyprolis infusion. Patient will be starting maintenance therapy with oral Revlimid in a couple of weeks. She reports doing well s/p surgery. Patient did not gain as much movement in her neck as hoped, but is in good spirits. She continues going to the gym a few times per week. Patient walks on the treadmill as well as weighted exercises to strengthen arm/leg muscles. She is eating small frequent meals every few hours. Patient reports foods taste great, however wishes her taste for sweets would not have returned. Patient is drinking one premier protein. She recalls 60-80 ounces of water. Patient denies nutrition impact symptoms.   Medications: reviewed  Labs: glucose 158, BUN 26  Anthropometrics: Weight 166 lb 8 oz today increased   8/31 - 165 lb 3.2 oz  8/18 - 159 lb 12 oz  7/11 - 164 lb 11.2 oz    NUTRITION DIAGNOSIS: Inadequate oral intake stable    INTERVENTION:  Continue eating small frequent meals and snacks with adequate calories and protein Encouraged weight maintenance Encouraged balanced diet with adequate carbs, protein, and fats  Continue cardio/strength training as able Continue protein shake once daily   MONITORING, EVALUATION, GOAL: weight trends, intake   NEXT VISIT: To be scheduled as needed

## 2022-07-28 NOTE — Patient Instructions (Signed)
[facility]  Discharge Instructions: Thank you for choosing [facility] to provide your oncology and hematology care.   If you have a lab appointment with the Cancer Center, please go directly to the Cancer Center and check in at the registration area.   Wear comfortable clothing and clothing appropriate for easy access to any Portacath or PICC line.   We strive to give you quality time with your provider. You may need to reschedule your appointment if you arrive late (15 or more minutes).  Arriving late affects you and other patients whose appointments are after yours.  Also, if you miss three or more appointments without notifying the office, you may be dismissed from the clinic at the provider's discretion.      For prescription refill requests, have your pharmacy contact our office and allow 72 hours for refills to be completed.    Today you received the following chemotherapy and/or immunotherapy agents: carfilzomib      To help prevent nausea and vomiting after your treatment, we encourage you to take your nausea medication as directed.  BELOW ARE SYMPTOMS THAT SHOULD BE REPORTED IMMEDIATELY: *FEVER GREATER THAN 100.4 F (38 C) OR HIGHER *CHILLS OR SWEATING *NAUSEA AND VOMITING THAT IS NOT CONTROLLED WITH YOUR NAUSEA MEDICATION *UNUSUAL SHORTNESS OF BREATH *UNUSUAL BRUISING OR BLEEDING *URINARY PROBLEMS (pain or burning when urinating, or frequent urination) *BOWEL PROBLEMS (unusual diarrhea, constipation, pain near the anus) TENDERNESS IN MOUTH AND THROAT WITH OR WITHOUT PRESENCE OF ULCERS (sore throat, sores in mouth, or a toothache) UNUSUAL RASH, SWELLING OR PAIN  UNUSUAL VAGINAL DISCHARGE OR ITCHING   Items with * indicate a potential emergency and should be followed up as soon as possible or go to the Emergency Department if any problems should occur.  Please show the CHEMOTHERAPY ALERT CARD or IMMUNOTHERAPY ALERT CARD at check-in  to the Emergency Department and triage nurse.  Should you have questions after your visit or need to cancel or reschedule your appointment, please contact [facility]  Dept: [phone]  and follow the prompts.  Office hours are 8:00 a.m. to 4:30 p.m. Monday - Friday. Please note that voicemails left after 4:00 p.m. may not be returned until the following business day.  We are closed weekends and major holidays. You have access to a nurse at all times for urgent questions. Please call the main number to the clinic Dept: [phone] and follow the prompts.   For any non-urgent questions, you may also contact your provider using MyChart. We now offer e-Visits for anyone 18 and older to request care online for non-urgent symptoms. For details visit [email or web address].   Also download the MyChart app! Go to the app store, search "MyChart", open the app, select , and log in with your MyChart username and password.  Masks are optional in the cancer centers. If you would like for your care team to wear a mask while they are taking care of you, please let them know. You may have one support person who is at least 67 years old accompany you for your appointments. 

## 2022-07-28 NOTE — Progress Notes (Signed)
Patient reports she has taken PO steroids at home prior to arrival. Per Dr Libby Maw note on 07/20/2022, ok to restart Zometa infusion in September. Released for today.

## 2022-08-04 ENCOUNTER — Inpatient Hospital Stay: Payer: BC Managed Care – PPO

## 2022-08-04 ENCOUNTER — Other Ambulatory Visit: Payer: Self-pay

## 2022-08-04 ENCOUNTER — Inpatient Hospital Stay: Payer: BC Managed Care – PPO | Admitting: Hematology and Oncology

## 2022-08-04 VITALS — BP 120/56 | HR 83 | Temp 98.0°F | Resp 16

## 2022-08-04 DIAGNOSIS — Z95828 Presence of other vascular implants and grafts: Secondary | ICD-10-CM

## 2022-08-04 DIAGNOSIS — C9000 Multiple myeloma not having achieved remission: Secondary | ICD-10-CM

## 2022-08-04 LAB — CMP (CANCER CENTER ONLY)
ALT: 21 U/L (ref 0–44)
AST: 20 U/L (ref 15–41)
Albumin: 4.1 g/dL (ref 3.5–5.0)
Alkaline Phosphatase: 73 U/L (ref 38–126)
Anion gap: 6 (ref 5–15)
BUN: 34 mg/dL — ABNORMAL HIGH (ref 8–23)
CO2: 24 mmol/L (ref 22–32)
Calcium: 9.1 mg/dL (ref 8.9–10.3)
Chloride: 110 mmol/L (ref 98–111)
Creatinine: 0.83 mg/dL (ref 0.44–1.00)
GFR, Estimated: >60 mL/min (ref >60)
Glucose, Bld: 133 mg/dL — ABNORMAL HIGH (ref 70–99)
Potassium: 4.5 mmol/L (ref 3.5–5.1)
Sodium: 140 mmol/L (ref 135–145)
Total Bilirubin: 0.8 mg/dL (ref 0.3–1.2)
Total Protein: 6.2 g/dL — ABNORMAL LOW (ref 6.5–8.1)

## 2022-08-04 LAB — CBC WITH DIFFERENTIAL (CANCER CENTER ONLY)
Abs Immature Granulocytes: 0.02 10*3/uL (ref 0.00–0.07)
Basophils Absolute: 0 10*3/uL (ref 0.0–0.1)
Basophils Relative: 0 %
Eosinophils Absolute: 0 10*3/uL (ref 0.0–0.5)
Eosinophils Relative: 1 %
HCT: 35.5 % — ABNORMAL LOW (ref 36.0–46.0)
Hemoglobin: 12.4 g/dL (ref 12.0–15.0)
Immature Granulocytes: 0 %
Lymphocytes Relative: 5 %
Lymphs Abs: 0.3 10*3/uL — ABNORMAL LOW (ref 0.7–4.0)
MCH: 33.1 pg (ref 26.0–34.0)
MCHC: 34.9 g/dL (ref 30.0–36.0)
MCV: 94.7 fL (ref 80.0–100.0)
Monocytes Absolute: 0.2 10*3/uL (ref 0.1–1.0)
Monocytes Relative: 3 %
Neutro Abs: 4.5 10*3/uL (ref 1.7–7.7)
Neutrophils Relative %: 91 %
Platelet Count: 105 10*3/uL — ABNORMAL LOW (ref 150–400)
RBC: 3.75 MIL/uL — ABNORMAL LOW (ref 3.87–5.11)
RDW: 12.9 % (ref 11.5–15.5)
WBC Count: 5 10*3/uL (ref 4.0–10.5)
nRBC: 0 % (ref 0.0–0.2)

## 2022-08-04 MED ORDER — SODIUM CHLORIDE 0.9% FLUSH
10.0000 mL | INTRAVENOUS | Status: DC | PRN
  Administered 2022-08-04: 10 mL

## 2022-08-04 MED ORDER — SODIUM CHLORIDE 0.9% FLUSH
10.0000 mL | Freq: Once | INTRAVENOUS | Status: AC
  Administered 2022-08-04: 10 mL

## 2022-08-04 MED ORDER — SODIUM CHLORIDE 0.9 % IV SOLN: Freq: Once | INTRAVENOUS | Status: AC

## 2022-08-04 MED ORDER — HEPARIN SOD (PORK) LOCK FLUSH 100 UNIT/ML IV SOLN
500.0000 [IU] | Freq: Once | INTRAVENOUS | Status: AC | PRN
  Administered 2022-08-04: 500 [IU]

## 2022-08-04 MED ORDER — DEXTROSE 5 % IV SOLN
70.0000 mg/m2 | Freq: Once | INTRAVENOUS | Status: AC
  Administered 2022-08-04: 130 mg via INTRAVENOUS
  Filled 2022-08-04: qty 60

## 2022-08-04 NOTE — Progress Notes (Signed)
Carol [name]   [phone]  PROGRESS NOTE  Patient Care Team: [name] as PCP - General (Physician Assistant)  Hematological/Oncological History # Plasmacytoma with Multiple Myeloma  07/29/2021: CT cervical spine shows expansile lytic lesion which almost completely replaces the normal C2 vertebral body  08/24/2021: PET CT scan shows increased metabolic activity at C2 and C7  08/25/2021: biopsy of C2 lesion showed finding consistent with plasma cell neoplasm.  09/08/2021: start radiation therapy 09/12/2021: establish care with Dr. Lorenso Courier  09/28/2021: end radiation therapy 10/10/2021: Bone marrow biopsy. Results confirm plasma cell neoplasm consistent with multiple myeloma, kappa restricted.  10/26/2021: Cycle 1 Day 1 of VRD 11/18/2021: Cycle 2 Day 1 of VRD 11/25/2021: Velcade HELD due to full body rash. Resolved with steroid therapy.  12/16/2021: Cycle 1 Day 1 of Dara/Rev/Dex 12/30/2021: HELD Daratumumab due to toxicity. Patient unable to tolerate. Requested holding the medication  01/06/2022: Cycle 1 Day 1 of Kd  02/03/2022: Cycle 2 Day 1 of Kd 03/03/2022: Cycle 3 Day 1 of Kd 03/31/2022: Cycle 4 Day 1 of Kd 04/28/2022: Cycle 5 Day 1 of Kd 05/26/2022: Cycle 6 Day 1 of Kd  06/30/2022: Cycle 7 Day 1 of Kd  07/20/2022: Cycle 8 Day 1 of Kd   Interval History:  Carol [name] 66 y.o. female with medical history significant for multiple myeloma with plasmacytoma who presents for a follow up visit. The patient's last visit was on 07/23/2022.  In the interim since her last visit she has had no major changes of her health.  On exam today [name] reports she has been well overall in the interim since her last visit.  She reports that overall she feels quite good.  She notes that she is not having any nausea, vomiting, or diarrhea.  She reports that she is not having any numbness or tingling.  Metallic taste she is having in her mouth is now gone.  She  is delighted that her A1c is currently 4.8% as she has decreased down on sweets.  She is scheduled for her last infusion today.  She notes that she has the Revlimid 10 mg p.o. and will be starting that after completion of her chemotherapy.  She continues to take her aspirin 81 mg p.o. daily without any bleeding or bruising.  She denies fevers, chills, night sweats, shortness of breath, chest pain or cough. She has no other complaints. Rest of the 10 point ROS is below.  MEDICAL HISTORY:  Past Medical History:  Diagnosis Date   Allergy    Anemia    Ankle edema    both ankles   Arthritis    hands,knees,elbows   Cancer ([place]) 2016   tumor kidney   Diabetes mellitus    type 2    Eczema    History of kidney stones    Hypothyroidism    Obesity    Sleep apnea    cpap machine setting of 3   Thyroid disease     SURGICAL HISTORY: Past Surgical History:  Procedure Laterality Date   BREAST BIOPSY Right 2001   CHOLECYSTECTOMY  2009   open   COLONOSCOPY     CYSTOSCOPY W/ RETROGRADES Left 11/22/2018   Procedure: CYSTOSCOPY WITH RETROGRADE PYELOGRAM;  Surgeon: Ardis Hughs, MD;  Location: [facility];  Service: Urology;  Laterality: Left;   CYSTOSCOPY WITH RETROGRADE PYELOGRAM, URETEROSCOPY AND STENT PLACEMENT Right 10/03/2021   Procedure: CYSTOSCOPY WITH RETROGRADE PYELOGRAM, URETEROSCOPY,STONE EXTRACTION AND STENT PLACEMENT;  Surgeon: Ceasar Mons, MD;  Location: [facility];  Service: Urology;  Laterality: Right;   DILATATION & CURRETTAGE/HYSTEROSCOPY WITH RESECTOCOPE N/A 01/21/2013   Procedure: DILATATION & CURETTAGE/HYSTEROSCOPY WITH RESECTOCOPE AND RESECTION OF ENDOMETRIAL;  Surgeon: Selinda Orion, MD;  Location: [facility];  Service: Gynecology;  Laterality: N/A;   HARDWARE REMOVAL N/A 06/05/2022   Procedure: Removal of Posterior Cervical Hardware from Occiput to Cervical Five;  Surgeon: Judith Part, MD;  Location: [facility];  Service: Neurosurgery;  Laterality:  N/A;   IR IMAGING GUIDED PORT INSERTION  02/01/2022   KNEE ARTHROSCOPY     left    POLYPECTOMY     POSTERIOR CERVICAL FUSION/FORAMINOTOMY N/A 08/25/2021   Procedure: Occiput to Cervical 5 Posterior cervical instrumented fusion with open biopsy of Cervical 2 Mass. Cervical Two Ganglionectomy;  Surgeon: Judith Part, MD;  Location: [facility];  Service: Neurosurgery;  Laterality: N/A;   ROBOT ASSISTED PYELOPLASTY Left 11/22/2018   Procedure: XI ROBOTIC ASSISTED ATTEMPTED PYELOPLASTY CONVERTED TO NEPHRECTOMY/LYSIS OF ADHESIONS/UMBILICAL HERNIA REPAIR;  Surgeon: Ardis Hughs, MD;  Location: [facility];  Service: Urology;  Laterality: Left;   ROBOTIC ASSITED PARTIAL NEPHRECTOMY Left 06/11/2015   Procedure: LEFT ROBOTIC ASSISTED LAPAROSCOPIC  PARTIAL NEPHRECTOMY;  Surgeon: Ardis Hughs, MD;  Location: [facility];  Service: Urology;  Laterality: Left;   ROTATOR CUFF REPAIR Left 2011   URETERAL BIOPSY Left 02/14/2017   Procedure: URETERAL BIOPSY;  Surgeon: Ardis Hughs, MD;  Location: [facility];  Service: Urology;  Laterality: Left;   URETEROSCOPY WITH HOLMIUM LASER LITHOTRIPSY Left 02/14/2017   Procedure: URETEROSCOPY LITHOTRIPSY, URETERAL BALLOON DILATION;  Surgeon: Ardis Hughs, MD;  Location: [facility];  Service: Urology;  Laterality: Left;  With STENT    SOCIAL HISTORY: Social History   Socioeconomic History   Marital status: Married    Spouse name: Not on file   Number of children: Not on file   Years of education: Not on file   Highest education level: Not on file  Occupational History   Not on file  Tobacco Use   Smoking status: Never   Smokeless tobacco: Never  Vaping Use   Vaping Use: Never used  Substance and Sexual Activity   Alcohol use: Yes    Comment: occ   Drug use: No   Sexual activity: Not on file  Other Topics Concern   Not on file  Social History Narrative   Not on file   Social Determinants of Health   Financial Resource Strain: Low Risk  (11/22/2018)    Overall Financial Resource Strain (CARDIA)    Difficulty of Paying Living Expenses: Not hard at all  Food Insecurity: Unknown (11/22/2018)   Hunger Vital Sign    Worried About [place] in the Last Year: Patient refused    [place] in the Last Year: Patient refused  Transportation Needs: Unknown (11/22/2018)   PRAPARE - Transportation    Lack of Transportation (Medical): Patient refused    Lack of Transportation (Non-Medical): Patient refused  Physical Activity: Unknown (11/22/2018)   Exercise Vital Sign    Days of Exercise per Week: Patient refused    Minutes of Exercise per Session: Patient refused  Stress: No Stress Concern Present (11/22/2018)   [employer] of [place] of Stress : Not at all  Social Connections: Unknown (11/22/2018)   Social Connection and Isolation Panel [NHANES]    Frequency of  Communication with Friends and Family: Patient refused    Frequency of Social Gatherings with Friends and Family: Patient refused    Attends Religious Services: Patient refused    Active Member of Clubs or Organizations: Patient refused    Attends Archivist Meetings: Patient refused    Marital Status: Patient refused  Intimate Partner Violence: Unknown (11/22/2018)   Humiliation, Afraid, Rape, and Kick questionnaire    Fear of Current or Ex-Partner: Patient refused    Emotionally Abused: Patient refused    Physically Abused: Patient refused    Sexually Abused: Patient refused    FAMILY HISTORY: Family History  Problem Relation Age of Onset   Cancer Father     ALLERGIES:  is allergic to morphine.  MEDICATIONS:  Current Outpatient Medications  Medication Sig Dispense Refill   acyclovir (ZOVIRAX) 400 MG tablet TAKE 1 TABLET BY MOUTH TWICE A DAY 180 tablet 1   allopurinol (ZYLOPRIM) 300 MG tablet TAKE 1 TABLET BY MOUTH EVERY DAY 90 tablet 1   Ascorbic Acid (VITAMIN C ADULT GUMMIES PO) Take 1 tablet  by mouth daily.     atorvastatin (LIPITOR) 20 MG tablet Take 20 mg by mouth at bedtime.     Cholecalciferol (VITAMIN D3 PO) Take by mouth.     [employer] ASPIRIN LOW DOSE 81 MG tablet TAKE 1 TABLET (81 MG TOTAL) BY MOUTH DAILY. SWALLOW WHOLE. 90 tablet 3   dexamethasone (DECADRON) 4 MG tablet Take 40 mg by mouth every Friday.     furosemide (LASIX) 20 MG tablet Take 20 mg by mouth daily.     HEMADY 20 MG TABS TAKE 40 MG BY MOUTH ONCE A WEEK. 2 TABLETS ON DAY OF CHEMO TREATMENT. 60 tablet 3   KLOR-CON M20 20 MEQ tablet TAKE 1 TABLET BY MOUTH EVERY DAY 30 tablet 1   lenalidomide (REVLIMID) 10 MG capsule Take 1 capsule (10 mg total) by mouth daily. [employer] Auth #  [id number]   Date Obtained 07/26/2022 21 capsule 0   levothyroxine (SYNTHROID) 50 MCG tablet Take 50 mcg by mouth daily before breakfast.     lidocaine-prilocaine (EMLA) cream Apply 1 application. topically as needed. 30 g 0   Magnesium Hydroxide (MILK OF MAGNESIA PO) Take 15 mLs by mouth daily as needed (constipation/diarrhea).     ondansetron (ZOFRAN) 8 MG tablet Take 1 tablet (8 mg total) by mouth every 8 (eight) hours as needed. 30 tablet 0   oxyCODONE-acetaminophen (PERCOCET) 5-325 MG tablet Take 1 tablet by mouth every 4 (four) hours as needed (pain). 30 tablet 0   OZEMPIC, 0.25 OR 0.5 MG/DOSE, 2 MG/1.5ML SOPN Inject 0.5 mg into the skin every Saturday.     PROAIR HFA 108 (90 BASE) MCG/ACT inhaler Inhale 2 puffs into the lungs every 4 (four) hours as needed for wheezing or shortness of breath. Uses seasonal  3   prochlorperazine (COMPAZINE) 10 MG tablet Take 1 tablet (10 mg total) by mouth every 6 (six) hours as needed for nausea or vomiting. 30 tablet 0   Sennosides (SENOKOT PO) Take 1 tablet by mouth daily as needed (constipation). Bid     vitamin B-12 (CYANOCOBALAMIN) 500 MCG tablet Take 500 mcg by mouth daily.     Vitamin D, Ergocalciferol, (DRISDOL) 1.25 MG (50000 UNIT) CAPS capsule Take 50,000 Units by mouth once a week. (Patient not  taking: Reported on 08/04/2022)     No current facility-administered medications for this visit.    REVIEW OF SYSTEMS:   Constitutional: ( - )  fevers, ( - )  chills , ( - ) night sweats Eyes: ( - ) blurriness of vision, ( - ) double vision, ( - ) watery eyes Ears, nose, mouth, throat, and face: ( - ) mucositis, ( - ) sore throat Respiratory: ( - ) cough, ( - ) dyspnea, ( - ) wheezes Cardiovascular: ( - ) palpitation, ( - ) chest discomfort, ( - ) lower extremity swelling Gastrointestinal:  ( - ) nausea, ( - ) heartburn, ( -) change in bowel habits Skin: ( - ) abnormal skin rashes Lymphatics: ( - ) new lymphadenopathy, ( - ) easy bruising Neurological: ( - ) numbness, ( - ) tingling, ( - ) new weaknesses Behavioral/Psych: ( - ) mood change, ( - ) new changes  All other systems were reviewed with the patient and are negative.  PHYSICAL EXAMINATION: ECOG PERFORMANCE STATUS: 1 - Symptomatic but completely ambulatory  Vitals:   08/04/22 0837  BP: 123/61  Pulse: 88  Resp: 14  Temp: 98.3 F (36.8 C)  SpO2: 100%      Filed Weights   08/04/22 0837  Weight: 162 lb 4.8 oz (73.6 kg)     GENERAL: Well-appearing middle-age Caucasian female alert, no distress and comfortable SKIN: No evidence of rash, erythema, dry skin, or raised lesions. EYES: conjunctiva are pink and non-injected, sclera clear LUNGS: clear to auscultation and percussion with normal breathing effort HEART: regular rate & rhythm and no murmurs and no lower extremity edema Musculoskeletal: no cyanosis of digits and no clubbing  PSYCH: alert & oriented x 3, fluent speech NEURO: no focal motor/sensory deficits  LABORATORY DATA:  I have reviewed the data as listed    Latest Ref Rng & Units 08/04/2022    8:07 AM 07/28/2022    8:27 AM 07/20/2022    8:01 AM  CBC  WBC 4.0 - 10.5 K/uL 5.0  3.2  2.8   Hemoglobin 12.0 - 15.0 g/dL 12.4  11.8  11.5   Hematocrit 36.0 - 46.0 % 35.5  33.0  33.0   Platelets 150 - 400 K/uL 105   113  178        Latest Ref Rng & Units 08/04/2022    8:07 AM 07/28/2022    8:27 AM 07/20/2022    8:01 AM  CMP  Glucose 70 - 99 mg/dL 133  158  188   BUN 8 - 23 mg/dL 34  26  23   Creatinine 0.44 - 1.00 mg/dL 0.83  0.83  0.75   Sodium 135 - 145 mmol/L 140  140  142   Potassium 3.5 - 5.1 mmol/L 4.5  4.3  4.1   Chloride 98 - 111 mmol/L 110  109  111   CO2 22 - 32 mmol/L '24  24  26   ' Calcium 8.9 - 10.3 mg/dL 9.1  8.9  9.1   Total Protein 6.5 - 8.1 g/dL 6.2  5.8  5.9   Total Bilirubin 0.3 - 1.2 mg/dL 0.8  0.6  0.6   Alkaline Phos 38 - 126 U/L 73  76  75   AST 15 - 41 U/L '20  14  14   ' ALT 0 - 44 U/L '21  14  11     ' Lab Results  Component Value Date   MPROTEIN Not Observed 08/04/2022   MPROTEIN Not Observed 07/20/2022   MPROTEIN Not Observed 06/16/2022   Lab Results  Component Value Date   KPAFRELGTCHN 3.6 07/20/2022   KPAFRELGTCHN 5.2 06/16/2022  KPAFRELGTCHN 3.3 05/05/2022   LAMBDASER 3.1 (L) 07/20/2022   LAMBDASER 3.0 (L) 06/16/2022   LAMBDASER 2.0 (L) 05/05/2022   KAPLAMBRATIO 1.16 07/20/2022   KAPLAMBRATIO 1.73 (H) 06/16/2022   KAPLAMBRATIO 1.65 05/05/2022    RADIOGRAPHIC STUDIES: No results found.  ASSESSMENT & PLAN BERLIE PERSKY 66 y.o. female with medical history significant for multiple myeloma with plasmacytoma who presents for a follow up visit.   # Free Kappa Multiple Myeloma # Plasmacytoma  -- original finding of L2 plasmacytoma on biopsy of back lesion, bone marrow biopsy confirms greater than 60% plasma cells consistent with multiple myeloma. --Cycle 1 Day 1 of VRD treatment started on 10/26/2021.  --VRD therapy stopped on 11/25/2021 due to rash. Started Dara/Rev/Dex on 12/16/2021.  --will order monthly SPEP, UPEP, SFLC and beta 2 microglobulin --weekly CBC, CMP, and LDH --Cycle 1 Day 1 of Kd started on 01/06/2022 Plan:  --Labs today were reviewed and adequate for treatment today.  Labs show white blood cell count 5.0, hemoglobin 12.4, MCV 94.7, and platelets  of 105 --Cycle 8 Day 15 of Kd therapy today.  --Discussed with patient that we could consider transitioning to a maintenance therapy of Revlimid, though there is concern that Revlimid was the medication causing her difficulties initially.  Patient would like to proceed with maintenance Revlimid 68m PO daily.   --RTC in 4 weeks for monitoring on therapy.   #Leukopenia/Thrombocytopenia/Anemia --WBC 5.0, levels oscillate. Hgb 12.4 stable. Platelet count 105 --Likely secondary to chemotherapy --continue to monitor.   #Rash--resolved --Present for approximately one week --Received IV solumedrol and medrol dose pak.  --Suspect drug induced (velcade)  #Thrush, resolved: --Treated with Nystatin mouthwash with improvement.   #Metallic taste/weight loss-improving:  --Encouraged to increase caloric intake and supplement with protein shakes.  #Supportive Care -- chemotherapy education complete  -- port placement complete. -- sent prescription for EMLA cream -- zofran [id number] PRN and compazine 1020mO q6H for nausea -- acyclovir 400m89m BID for VCZ prophylaxis. Can d/c approximately 4 weeks after last dose of Kyprolis.  -- allopurinol OK to d/c at this time.  -- Started zometa on 02/03/2022. Will continue q 3 months.HOLD zometa in June 2023 due to upcoming spine surgery. Last dose on 07/28/2022, next due in Dec 2023.  -- no pain medication required at this time.   No orders of the defined types were placed in this encounter.  All questions were answered. The patient knows to call the clinic with any problems, questions or concerns.  I have spent a total of 30 minutes minutes of face-to-face and non-face-to-face time, preparing to see the patient, performing a medically appropriate examination, counseling and educating the patient, ordering medications/tests, documenting clinical information in the electronic health record, and care coordination.   JohnLedell Peoples Department of  Hematology/Oncology [facility]: [phone]: [phone]: [email or web address]  08/16/2022 9:23 AM

## 2022-08-04 NOTE — Progress Notes (Signed)
Patient states that she took 40 mg decadron PO at 0600 today.

## 2022-08-04 NOTE — Patient Instructions (Signed)
[facility]  Discharge Instructions: Thank you for choosing [facility] to provide your oncology and hematology care.   If you have a lab appointment with the Cancer Center, please go directly to the Cancer Center and check in at the registration area.   Wear comfortable clothing and clothing appropriate for easy access to any Portacath or PICC line.   We strive to give you quality time with your provider. You may need to reschedule your appointment if you arrive late (15 or more minutes).  Arriving late affects you and other patients whose appointments are after yours.  Also, if you miss three or more appointments without notifying the office, you may be dismissed from the clinic at the provider's discretion.      For prescription refill requests, have your pharmacy contact our office and allow 72 hours for refills to be completed.    Today you received the following chemotherapy and/or immunotherapy agents: carfilzomib      To help prevent nausea and vomiting after your treatment, we encourage you to take your nausea medication as directed.  BELOW ARE SYMPTOMS THAT SHOULD BE REPORTED IMMEDIATELY: *FEVER GREATER THAN 100.4 F (38 C) OR HIGHER *CHILLS OR SWEATING *NAUSEA AND VOMITING THAT IS NOT CONTROLLED WITH YOUR NAUSEA MEDICATION *UNUSUAL SHORTNESS OF BREATH *UNUSUAL BRUISING OR BLEEDING *URINARY PROBLEMS (pain or burning when urinating, or frequent urination) *BOWEL PROBLEMS (unusual diarrhea, constipation, pain near the anus) TENDERNESS IN MOUTH AND THROAT WITH OR WITHOUT PRESENCE OF ULCERS (sore throat, sores in mouth, or a toothache) UNUSUAL RASH, SWELLING OR PAIN  UNUSUAL VAGINAL DISCHARGE OR ITCHING   Items with * indicate a potential emergency and should be followed up as soon as possible or go to the Emergency Department if any problems should occur.  Please show the CHEMOTHERAPY ALERT CARD or IMMUNOTHERAPY ALERT CARD at check-in  to the Emergency Department and triage nurse.  Should you have questions after your visit or need to cancel or reschedule your appointment, please contact [facility]  Dept: [phone]  and follow the prompts.  Office hours are 8:00 a.m. to 4:30 p.m. Monday - Friday. Please note that voicemails left after 4:00 p.m. may not be returned until the following business day.  We are closed weekends and major holidays. You have access to a nurse at all times for urgent questions. Please call the main number to the clinic Dept: [phone] and follow the prompts.   For any non-urgent questions, you may also contact your provider using MyChart. We now offer e-Visits for anyone 18 and older to request care online for non-urgent symptoms. For details visit [email or web address].   Also download the MyChart app! Go to the app store, search "MyChart", open the app, select [facility], and log in with your MyChart username and password.  Masks are optional in the cancer centers. If you would like for your care team to wear a mask while they are taking care of you, please let them know. You may have one support person who is at least 67 years old accompany you for your appointments. 

## 2022-08-05 ENCOUNTER — Other Ambulatory Visit: Payer: Self-pay

## 2022-08-08 ENCOUNTER — Other Ambulatory Visit: Payer: Self-pay

## 2022-08-10 LAB — MULTIPLE MYELOMA PANEL, SERUM
Albumin SerPl Elph-Mcnc: 3.7 g/dL (ref 2.9–4.4)
Albumin/Glob SerPl: 2 — ABNORMAL HIGH (ref 0.7–1.7)
Alpha 1: 0.2 g/dL (ref 0.0–0.4)
Alpha2 Glob SerPl Elph-Mcnc: 0.6 g/dL (ref 0.4–1.0)
B-Globulin SerPl Elph-Mcnc: 0.9 g/dL (ref 0.7–1.3)
Gamma Glob SerPl Elph-Mcnc: 0.1 g/dL — ABNORMAL LOW (ref 0.4–1.8)
Globulin, Total: 1.9 g/dL — ABNORMAL LOW (ref 2.2–3.9)
IgA: 7 mg/dL — ABNORMAL LOW (ref 87–352)
IgG (Immunoglobin G), Serum: 141 mg/dL — ABNORMAL LOW (ref 586–1602)
IgM (Immunoglobulin M), Srm: 10 mg/dL — ABNORMAL LOW (ref 26–217)
Total Protein ELP: 5.6 g/dL — ABNORMAL LOW (ref 6.0–8.5)

## 2022-08-12 ENCOUNTER — Other Ambulatory Visit: Payer: Self-pay

## 2022-08-16 ENCOUNTER — Encounter: Payer: Self-pay | Admitting: Hematology and Oncology

## 2022-08-23 ENCOUNTER — Other Ambulatory Visit: Payer: Self-pay | Admitting: Hematology and Oncology

## 2022-08-24 ENCOUNTER — Other Ambulatory Visit: Payer: Self-pay | Admitting: *Deleted

## 2022-08-24 ENCOUNTER — Encounter: Payer: Self-pay | Admitting: Hematology and Oncology

## 2022-08-24 MED ORDER — LENALIDOMIDE 10 MG PO CAPS: ORAL_CAPSULE | ORAL | 0 refills | Status: DC

## 2022-09-04 ENCOUNTER — Other Ambulatory Visit: Payer: Self-pay

## 2022-09-04 ENCOUNTER — Inpatient Hospital Stay: Payer: BC Managed Care – PPO | Attending: Radiation Oncology

## 2022-09-04 ENCOUNTER — Inpatient Hospital Stay: Payer: BC Managed Care – PPO | Admitting: Hematology and Oncology

## 2022-09-04 ENCOUNTER — Telehealth: Payer: Self-pay | Admitting: Hematology and Oncology

## 2022-09-04 ENCOUNTER — Other Ambulatory Visit: Payer: Self-pay | Admitting: Hematology and Oncology

## 2022-09-04 VITALS — BP 117/72 | HR 87 | Temp 97.6°F | Resp 15 | Wt 161.4 lb

## 2022-09-04 DIAGNOSIS — C9000 Multiple myeloma not having achieved remission: Secondary | ICD-10-CM

## 2022-09-04 DIAGNOSIS — C9001 Multiple myeloma in remission: Secondary | ICD-10-CM | POA: Diagnosis not present

## 2022-09-04 DIAGNOSIS — Z95828 Presence of other vascular implants and grafts: Secondary | ICD-10-CM | POA: Diagnosis not present

## 2022-09-04 LAB — CMP (CANCER CENTER ONLY)
ALT: 9 U/L (ref 0–44)
AST: 13 U/L — ABNORMAL LOW (ref 15–41)
Albumin: 4.4 g/dL (ref 3.5–5.0)
Alkaline Phosphatase: 67 U/L (ref 38–126)
Anion gap: 4 — ABNORMAL LOW (ref 5–15)
BUN: 18 mg/dL (ref 8–23)
CO2: 30 mmol/L (ref 22–32)
Calcium: 9 mg/dL (ref 8.9–10.3)
Chloride: 106 mmol/L (ref 98–111)
Creatinine: 0.93 mg/dL (ref 0.44–1.00)
GFR, Estimated: >60 mL/min (ref >60)
Glucose, Bld: 120 mg/dL — ABNORMAL HIGH (ref 70–99)
Potassium: 3.5 mmol/L (ref 3.5–5.1)
Sodium: 140 mmol/L (ref 135–145)
Total Bilirubin: 0.9 mg/dL (ref 0.3–1.2)
Total Protein: 6.3 g/dL — ABNORMAL LOW (ref 6.5–8.1)

## 2022-09-04 LAB — CBC WITH DIFFERENTIAL (CANCER CENTER ONLY)
Abs Immature Granulocytes: 0.01 10*3/uL (ref 0.00–0.07)
Basophils Absolute: 0.1 10*3/uL (ref 0.0–0.1)
Basophils Relative: 2 %
Eosinophils Absolute: 0 10*3/uL (ref 0.0–0.5)
Eosinophils Relative: 2 %
HCT: 35.6 % — ABNORMAL LOW (ref 36.0–46.0)
Hemoglobin: 12.4 g/dL (ref 12.0–15.0)
Immature Granulocytes: 0 %
Lymphocytes Relative: 35 %
Lymphs Abs: 0.8 10*3/uL (ref 0.7–4.0)
MCH: 32 pg (ref 26.0–34.0)
MCHC: 34.8 g/dL (ref 30.0–36.0)
MCV: 92 fL (ref 80.0–100.0)
Monocytes Absolute: 0.4 10*3/uL (ref 0.1–1.0)
Monocytes Relative: 15 %
Neutro Abs: 1.1 10*3/uL — ABNORMAL LOW (ref 1.7–7.7)
Neutrophils Relative %: 46 %
Platelet Count: 168 10*3/uL (ref 150–400)
RBC: 3.87 MIL/uL (ref 3.87–5.11)
RDW: 13.2 % (ref 11.5–15.5)
WBC Count: 2.4 10*3/uL — ABNORMAL LOW (ref 4.0–10.5)
nRBC: 0 % (ref 0.0–0.2)

## 2022-09-04 LAB — LACTATE DEHYDROGENASE: LDH: 192 U/L (ref 98–192)

## 2022-09-04 NOTE — Telephone Encounter (Signed)
Per 10/16 los called and left message for pt about appointments

## 2022-09-05 ENCOUNTER — Encounter: Payer: Self-pay | Admitting: Hematology and Oncology

## 2022-09-05 ENCOUNTER — Other Ambulatory Visit: Payer: Self-pay

## 2022-09-05 LAB — KAPPA/LAMBDA LIGHT CHAINS
Kappa free light chain: 9.2 mg/L (ref 3.3–19.4)
Kappa, lambda light chain ratio: 1.48 (ref 0.26–1.65)
Lambda free light chains: 6.2 mg/L (ref 5.7–26.3)

## 2022-09-05 NOTE — Progress Notes (Signed)
[place] [phone]   [phone]  PROGRESS NOTE  Patient Care Team: [name] as PCP - General (Physician Assistant)  Hematological/Oncological History # Plasmacytoma with Multiple Myeloma  07/29/2021: CT cervical spine shows expansile lytic lesion which almost completely replaces the normal C2 vertebral body  08/24/2021: PET CT scan shows increased metabolic activity at C2 and C7  08/25/2021: biopsy of C2 lesion showed finding consistent with plasma cell neoplasm.  09/08/2021: start radiation therapy 09/12/2021: establish care with Dr. Lorenso Courier  09/28/2021: end radiation therapy 10/10/2021: Bone marrow biopsy. Results confirm plasma cell neoplasm consistent with multiple myeloma, kappa restricted.  10/26/2021: Cycle 1 Day 1 of VRD 11/18/2021: Cycle 2 Day 1 of VRD 11/25/2021: Velcade HELD due to full body rash. Resolved with steroid therapy.  12/16/2021: Cycle 1 Day 1 of Dara/Rev/Dex 12/30/2021: HELD Daratumumab due to toxicity. Patient unable to tolerate. Requested holding the medication  01/06/2022: Cycle 1 Day 1 of Kd  02/03/2022: Cycle 2 Day 1 of Kd 03/03/2022: Cycle 3 Day 1 of Kd 03/31/2022: Cycle 4 Day 1 of Kd 04/28/2022: Cycle 5 Day 1 of Kd 05/26/2022: Cycle 6 Day 1 of Kd  06/30/2022: Cycle 7 Day 1 of Kd  07/20/2022: Cycle 8 Day 1 of Kd  Sept 2023: start of maintenance revlimid 10 mg PO daily 21 of 28 day cycles.   Interval History:  Carol [name] 66 y.o. female with medical history significant for multiple myeloma with plasmacytoma who presents for a follow up visit. The patient's last visit was on 07/23/2022.  In the interim since her last visit she has had no major changes of her health.  On exam today Carol [name] reports she has been well overall interim since her last visit.  She reports he has a colonoscopy scheduled for Wednesday and held her aspirin this week and that this patient the procedure.  She also has a catheter coming up later  this month and the left eye will be done in November.  She reports that she does have some occasional cramps at night prickly in her legs.  She reports that she has received the updated vaccines for COVID, flu, and RSV.  She reports that she was having a little bit itching the first week on the maintenance therapy and this made her quite nervous with the cooler weather the symptoms have resolved.  She is not having any issues with nausea, vomiting, or diarrhea.  Her appetite has been strong and her energy has been good.  She does that she is mostly limited by her neck but not by her energy levels.  She continues to take her aspirin 81 mg p.o. daily without any bleeding or bruising.  She denies fevers, chills, night sweats, shortness of breath, chest pain or cough. She has no other complaints. Rest of the 10 point ROS is below.  MEDICAL HISTORY:  Past Medical History:  Diagnosis Date   Allergy    Anemia    Ankle edema    both ankles   Arthritis    hands,knees,elbows   Cancer ([place]) 2016   tumor kidney   Diabetes mellitus    type 2    Eczema    History of kidney stones    Hypothyroidism    Obesity    Sleep apnea    cpap machine setting of 3   Thyroid disease     SURGICAL HISTORY: Past Surgical History:  Procedure Laterality Date   BREAST BIOPSY Right 2001  CHOLECYSTECTOMY  2009   open   COLONOSCOPY     CYSTOSCOPY W/ RETROGRADES Left 11/22/2018   Procedure: CYSTOSCOPY WITH RETROGRADE PYELOGRAM;  Surgeon: Ardis Hughs, MD;  Location: [facility];  Service: Urology;  Laterality: Left;   CYSTOSCOPY WITH RETROGRADE PYELOGRAM, URETEROSCOPY AND STENT PLACEMENT Right 10/03/2021   Procedure: CYSTOSCOPY WITH RETROGRADE PYELOGRAM, URETEROSCOPY,STONE EXTRACTION AND STENT PLACEMENT;  Surgeon: Ceasar Mons, MD;  Location: [facility];  Service: Urology;  Laterality: Right;   DILATATION & CURRETTAGE/HYSTEROSCOPY WITH RESECTOCOPE N/A 01/21/2013   Procedure: DILATATION & CURETTAGE/HYSTEROSCOPY  WITH RESECTOCOPE AND RESECTION OF ENDOMETRIAL;  Surgeon: Selinda Orion, MD;  Location: [facility];  Service: Gynecology;  Laterality: N/A;   HARDWARE REMOVAL N/A 06/05/2022   Procedure: Removal of Posterior Cervical Hardware from Occiput to Cervical Five;  Surgeon: Judith Part, MD;  Location: [facility];  Service: Neurosurgery;  Laterality: N/A;   IR IMAGING GUIDED PORT INSERTION  02/01/2022   KNEE ARTHROSCOPY     left    POLYPECTOMY     POSTERIOR CERVICAL FUSION/FORAMINOTOMY N/A 08/25/2021   Procedure: Occiput to Cervical 5 Posterior cervical instrumented fusion with open biopsy of Cervical 2 Mass. Cervical Two Ganglionectomy;  Surgeon: Judith Part, MD;  Location: [facility];  Service: Neurosurgery;  Laterality: N/A;   ROBOT ASSISTED PYELOPLASTY Left 11/22/2018   Procedure: XI ROBOTIC ASSISTED ATTEMPTED PYELOPLASTY CONVERTED TO NEPHRECTOMY/LYSIS OF ADHESIONS/UMBILICAL HERNIA REPAIR;  Surgeon: Ardis Hughs, MD;  Location: [facility];  Service: Urology;  Laterality: Left;   ROBOTIC ASSITED PARTIAL NEPHRECTOMY Left 06/11/2015   Procedure: LEFT ROBOTIC ASSISTED LAPAROSCOPIC  PARTIAL NEPHRECTOMY;  Surgeon: Ardis Hughs, MD;  Location: [facility];  Service: Urology;  Laterality: Left;   ROTATOR CUFF REPAIR Left 2011   URETERAL BIOPSY Left 02/14/2017   Procedure: URETERAL BIOPSY;  Surgeon: Ardis Hughs, MD;  Location: [facility];  Service: Urology;  Laterality: Left;   URETEROSCOPY WITH HOLMIUM LASER LITHOTRIPSY Left 02/14/2017   Procedure: URETEROSCOPY LITHOTRIPSY, URETERAL BALLOON DILATION;  Surgeon: Ardis Hughs, MD;  Location: [facility];  Service: Urology;  Laterality: Left;  With STENT    SOCIAL HISTORY: Social History   Socioeconomic History   Marital status: Married    Spouse name: Not on file   Number of children: Not on file   Years of education: Not on file   Highest education level: Not on file  Occupational History   Not on file  Tobacco Use   Smoking  status: Never   Smokeless tobacco: Never  Vaping Use   Vaping Use: Never used  Substance and Sexual Activity   Alcohol use: Yes    Comment: occ   Drug use: No   Sexual activity: Not on file  Other Topics Concern   Not on file  Social History Narrative   Not on file   Social Determinants of Health   Financial Resource Strain: Low Risk  (11/22/2018)   Overall Financial Resource Strain (CARDIA)    Difficulty of Paying Living Expenses: Not hard at all  Food Insecurity: Unknown (11/22/2018)   Hunger Vital Sign    Worried About [place] in the Last Year: Patient refused    [place] in the Last Year: Patient refused  Transportation Needs: Unknown (11/22/2018)   PRAPARE - Transportation    Lack of Transportation (Medical): Patient refused    Lack of Transportation (Non-Medical): Patient refused  Physical Activity: Unknown (11/22/2018)   Exercise Vital Sign  Days of Exercise per Week: Patient refused    Minutes of Exercise per Session: Patient refused  Stress: No Stress Concern Present (11/22/2018)   [place]    Feeling of Stress : Not at all  Social Connections: Unknown (11/22/2018)   Social Connection and Isolation Panel [NHANES]    Frequency of Communication with Friends and Family: Patient refused    Frequency of Social Gatherings with Friends and Family: Patient refused    Attends Religious Services: Patient refused    Active Member of Clubs or Organizations: Patient refused    Attends Archivist Meetings: Patient refused    Marital Status: Patient refused  Intimate Partner Violence: Unknown (11/22/2018)   Humiliation, Afraid, Rape, and Kick questionnaire    Fear of Current or Ex-Partner: Patient refused    Emotionally Abused: Patient refused    Physically Abused: Patient refused    Sexually Abused: Patient refused    FAMILY HISTORY: Family History  Problem Relation Age of Onset    Cancer Father     ALLERGIES:  is allergic to morphine.  MEDICATIONS:  Current Outpatient Medications  Medication Sig Dispense Refill   acyclovir (ZOVIRAX) 400 MG tablet TAKE 1 TABLET BY MOUTH TWICE A DAY 180 tablet 1   Ascorbic Acid (VITAMIN C ADULT GUMMIES PO) Take 1 tablet by mouth daily.     atorvastatin (LIPITOR) 20 MG tablet Take 20 mg by mouth at bedtime.     Calcium Carbonate-Vitamin D 600-5 MG-MCG CAPS Take 1 capsule by mouth every morning.     Cholecalciferol (VITAMIN D3 PO) Take by mouth.     [employer] ASPIRIN LOW DOSE 81 MG tablet TAKE 1 TABLET (81 MG TOTAL) BY MOUTH DAILY. SWALLOW WHOLE. 90 tablet 3   Cyanocobalamin (VITAMIN B12) 1000 MCG TBCR Take 1 tablet by mouth every morning.     furosemide (LASIX) 20 MG tablet Take 20 mg by mouth daily.     KLOR-CON M20 20 MEQ tablet TAKE 1 TABLET BY MOUTH EVERY DAY 30 tablet 1   lenalidomide (REVLIMID) 10 MG capsule TAKE 1 CAPSULE BY MOUTH 1 TIME A DAY [employer] auth # [id number] Date obtained 08/24/22 21 capsule 0   levothyroxine (SYNTHROID) 50 MCG tablet Take 50 mcg by mouth daily before breakfast.     lidocaine-prilocaine (EMLA) cream Apply 1 application. topically as needed. 30 g 0   Magnesium Hydroxide (MILK OF MAGNESIA PO) Take 15 mLs by mouth daily as needed (constipation/diarrhea).     ondansetron (ZOFRAN) 8 MG tablet Take 1 tablet (8 mg total) by mouth every 8 (eight) hours as needed. (Patient not taking: Reported on 09/04/2022) 30 tablet 0   oxyCODONE-acetaminophen (PERCOCET) 5-325 MG tablet Take 1 tablet by mouth every 4 (four) hours as needed (pain). 30 tablet 0   OZEMPIC, 0.25 OR 0.5 MG/DOSE, 2 MG/1.5ML SOPN Inject 0.5 mg into the skin every Saturday.     PROAIR HFA 108 (90 BASE) MCG/ACT inhaler Inhale 2 puffs into the lungs every 4 (four) hours as needed for wheezing or shortness of breath. Uses seasonal  3   prochlorperazine (COMPAZINE) 10 MG tablet Take 1 tablet (10 mg total) by mouth every 6 (six) hours as needed for nausea or  vomiting. 30 tablet 0   Sennosides (SENOKOT PO) Take 1 tablet by mouth daily as needed (constipation). Bid     Vitamin D, Ergocalciferol, (DRISDOL) 1.25 MG (50000 UNIT) CAPS capsule Take 50,000 Units by mouth once a week. (Patient  not taking: Reported on 08/04/2022)     No current facility-administered medications for this visit.    REVIEW OF SYSTEMS:   Constitutional: ( - ) fevers, ( - )  chills , ( - ) night sweats Eyes: ( - ) blurriness of vision, ( - ) double vision, ( - ) watery eyes Ears, nose, mouth, throat, and face: ( - ) mucositis, ( - ) sore throat Respiratory: ( - ) cough, ( - ) dyspnea, ( - ) wheezes Cardiovascular: ( - ) palpitation, ( - ) chest discomfort, ( - ) lower extremity swelling Gastrointestinal:  ( - ) nausea, ( - ) heartburn, ( -) change in bowel habits Skin: ( - ) abnormal skin rashes Lymphatics: ( - ) new lymphadenopathy, ( - ) easy bruising Neurological: ( - ) numbness, ( - ) tingling, ( - ) new weaknesses Behavioral/Psych: ( - ) mood change, ( - ) new changes  All other systems were reviewed with the patient and are negative.  PHYSICAL EXAMINATION: ECOG PERFORMANCE STATUS: 1 - Symptomatic but completely ambulatory  Vitals:   09/04/22 1155  BP: 117/72  Pulse: 87  Resp: 15  Temp: 97.6 F (36.4 C)  SpO2: 100%      Filed Weights   09/04/22 1155  Weight: 161 lb 6.4 oz (73.2 kg)     GENERAL: Well-appearing middle-age Caucasian female alert, no distress and comfortable SKIN: No evidence of rash, erythema, dry skin, or raised lesions. EYES: conjunctiva are pink and non-injected, sclera clear LUNGS: clear to auscultation and percussion with normal breathing effort HEART: regular rate & rhythm and no murmurs and no lower extremity edema Musculoskeletal: no cyanosis of digits and no clubbing  PSYCH: alert & oriented x 3, fluent speech NEURO: no focal motor/sensory deficits  LABORATORY DATA:  I have reviewed the data as listed    Latest Ref Rng &  Units 09/04/2022   11:18 AM 08/04/2022    8:07 AM 07/28/2022    8:27 AM  CBC  WBC 4.0 - 10.5 K/uL 2.4  5.0  3.2   Hemoglobin 12.0 - 15.0 g/dL 12.4  12.4  11.8   Hematocrit 36.0 - 46.0 % 35.6  35.5  33.0   Platelets 150 - 400 K/uL 168  105  113        Latest Ref Rng & Units 09/04/2022   11:18 AM 08/04/2022    8:07 AM 07/28/2022    8:27 AM  CMP  Glucose 70 - 99 mg/dL 120  133  158   BUN 8 - 23 mg/dL 18  34  26   Creatinine 0.44 - 1.00 mg/dL 0.93  0.83  0.83   Sodium 135 - 145 mmol/L 140  140  140   Potassium 3.5 - 5.1 mmol/L 3.5  4.5  4.3   Chloride 98 - 111 mmol/L 106  110  109   CO2 22 - 32 mmol/L '30  24  24   ' Calcium 8.9 - 10.3 mg/dL 9.0  9.1  8.9   Total Protein 6.5 - 8.1 g/dL 6.3  6.2  5.8   Total Bilirubin 0.3 - 1.2 mg/dL 0.9  0.8  0.6   Alkaline Phos 38 - 126 U/L 67  73  76   AST 15 - 41 U/L '13  20  14   ' ALT 0 - 44 U/L '9  21  14     ' Lab Results  Component Value Date   MPROTEIN Not Observed 08/04/2022   MPROTEIN Not  Observed 07/20/2022   MPROTEIN Not Observed 06/16/2022   Lab Results  Component Value Date   KPAFRELGTCHN 3.6 07/20/2022   KPAFRELGTCHN 5.2 06/16/2022   KPAFRELGTCHN 3.3 05/05/2022   LAMBDASER 3.1 (L) 07/20/2022   LAMBDASER 3.0 (L) 06/16/2022   LAMBDASER 2.0 (L) 05/05/2022   KAPLAMBRATIO 1.16 07/20/2022   KAPLAMBRATIO 1.73 (H) 06/16/2022   KAPLAMBRATIO 1.65 05/05/2022    RADIOGRAPHIC STUDIES: No results found.  ASSESSMENT & PLAN [place] DEARING 66 y.o. female with medical history significant for multiple myeloma with plasmacytoma who presents for a follow up visit.   # Free Kappa Multiple Myeloma # Plasmacytoma  -- original finding of L2 plasmacytoma on biopsy of back lesion, bone marrow biopsy confirms greater than 60% plasma cells consistent with multiple myeloma. --Cycle 1 Day 1 of VRD treatment started on 10/26/2021.  --VRD therapy stopped on 11/25/2021 due to rash. Started Dara/Rev/Dex on 12/16/2021.  --will order monthly SPEP, UPEP, SFLC and  beta 2 microglobulin --weekly CBC, CMP, and LDH --Cycle 1 Day 1 of Kd started on 01/06/2022 Plan:  --Labs show white blood cell count 2.4, hemoglobin 12.4, MCV 92.0, and platelets of 168 --Patient is currently on maintenance Revlimid 10 mg daily 21 to 28-day cycle. --Continue monthly protein labs with SPEP and serum free light chains. --RTC in 4 weeks for monitoring on therapy.   #Leukopenia/Thrombocytopenia/Anemia --hite blood cell count 2.4, hemoglobin 12.4, MCV 92.0, and platelets of 168 --Likely secondary to chemotherapy --continue to monitor.   #Rash--resolved --Present for approximately one week --Received IV solumedrol and medrol dose pak.  --Suspect drug induced (velcade)  #Thrush, resolved: --Treated with Nystatin mouthwash with improvement.   #Metallic taste/weight loss-improving:  --Encouraged to increase caloric intake and supplement with protein shakes.  #Supportive Care -- chemotherapy education complete  -- port placement complete. -- sent prescription for EMLA cream -- zofran 40m q8H PRN and compazine 155mPO q6H for nausea -- Started zometa on 02/03/2022. Will continue q 3 months.HOLD zometa in June 2023 due to upcoming spine surgery. Last dose on 07/28/2022, next due in Dec 2023.  -- no pain medication required at this time.   No orders of the defined types were placed in this encounter.  All questions were answered. The patient knows to call the clinic with any problems, questions or concerns.  I have spent a total of 30 minutes minutes of face-to-face and non-face-to-face time, preparing to see the patient, performing a medically appropriate examination, counseling and educating the patient, ordering medications/tests, documenting clinical information in the electronic health record, and care coordination.   JoLedell PeoplesMD Department of Hematology/Oncology [place] LL[facility]one: [phone]: [phone]:  [email or web address]  09/05/2022 3:14 PM

## 2022-09-07 LAB — MULTIPLE MYELOMA PANEL, SERUM
Albumin SerPl Elph-Mcnc: 3.9 g/dL (ref 2.9–4.4)
Albumin/Glob SerPl: 2 — ABNORMAL HIGH (ref 0.7–1.7)
Alpha 1: 0.3 g/dL (ref 0.0–0.4)
Alpha2 Glob SerPl Elph-Mcnc: 0.6 g/dL (ref 0.4–1.0)
B-Globulin SerPl Elph-Mcnc: 0.9 g/dL (ref 0.7–1.3)
Gamma Glob SerPl Elph-Mcnc: 0.3 g/dL — ABNORMAL LOW (ref 0.4–1.8)
Globulin, Total: 2 g/dL — ABNORMAL LOW (ref 2.2–3.9)
IgA: 48 mg/dL — ABNORMAL LOW (ref 87–352)
IgG (Immunoglobin G), Serum: 227 mg/dL — ABNORMAL LOW (ref 586–1602)
IgM (Immunoglobulin M), Srm: 24 mg/dL — ABNORMAL LOW (ref 26–217)
Total Protein ELP: 5.9 g/dL — ABNORMAL LOW (ref 6.0–8.5)

## 2022-09-15 ENCOUNTER — Other Ambulatory Visit: Payer: Self-pay | Admitting: *Deleted

## 2022-09-15 ENCOUNTER — Other Ambulatory Visit: Payer: Self-pay | Admitting: Hematology and Oncology

## 2022-09-15 MED ORDER — LENALIDOMIDE 10 MG PO CAPS: ORAL_CAPSULE | ORAL | 0 refills | Status: DC

## 2022-09-22 ENCOUNTER — Other Ambulatory Visit: Payer: Self-pay | Admitting: Hematology and Oncology

## 2022-09-26 ENCOUNTER — Other Ambulatory Visit: Payer: Self-pay

## 2022-09-28 ENCOUNTER — Telehealth: Payer: Self-pay | Admitting: Hematology and Oncology

## 2022-09-28 NOTE — Telephone Encounter (Signed)
Called patient per 11/9 in basket. Patient notified of new appointment.

## 2022-09-29 ENCOUNTER — Other Ambulatory Visit: Payer: Self-pay

## 2022-10-01 ENCOUNTER — Other Ambulatory Visit: Payer: Self-pay

## 2022-10-02 ENCOUNTER — Other Ambulatory Visit: Payer: BC Managed Care – PPO

## 2022-10-02 ENCOUNTER — Ambulatory Visit: Payer: BC Managed Care – PPO | Admitting: Hematology and Oncology

## 2022-10-09 ENCOUNTER — Other Ambulatory Visit: Payer: Self-pay

## 2022-10-09 ENCOUNTER — Inpatient Hospital Stay: Payer: BC Managed Care – PPO | Attending: Radiation Oncology

## 2022-10-09 ENCOUNTER — Inpatient Hospital Stay: Payer: BC Managed Care – PPO | Admitting: Physician Assistant

## 2022-10-09 VITALS — BP 108/58 | HR 75 | Temp 98.4°F | Resp 16 | Wt 164.6 lb

## 2022-10-09 DIAGNOSIS — D72819 Decreased white blood cell count, unspecified: Secondary | ICD-10-CM | POA: Insufficient documentation

## 2022-10-09 DIAGNOSIS — Z95828 Presence of other vascular implants and grafts: Secondary | ICD-10-CM

## 2022-10-09 DIAGNOSIS — C9001 Multiple myeloma in remission: Secondary | ICD-10-CM

## 2022-10-09 DIAGNOSIS — Z79899 Other long term (current) drug therapy: Secondary | ICD-10-CM | POA: Insufficient documentation

## 2022-10-09 DIAGNOSIS — C9000 Multiple myeloma not having achieved remission: Secondary | ICD-10-CM | POA: Insufficient documentation

## 2022-10-09 DIAGNOSIS — D649 Anemia, unspecified: Secondary | ICD-10-CM | POA: Diagnosis not present

## 2022-10-09 DIAGNOSIS — D696 Thrombocytopenia, unspecified: Secondary | ICD-10-CM | POA: Diagnosis not present

## 2022-10-09 LAB — LACTATE DEHYDROGENASE: LDH: 173 U/L (ref 98–192)

## 2022-10-09 LAB — CBC WITH DIFFERENTIAL (CANCER CENTER ONLY)
Abs Immature Granulocytes: 0.01 10*3/uL (ref 0.00–0.07)
Basophils Absolute: 0 10*3/uL (ref 0.0–0.1)
Basophils Relative: 1 %
Eosinophils Absolute: 0.1 10*3/uL (ref 0.0–0.5)
Eosinophils Relative: 2 %
HCT: 34.9 % — ABNORMAL LOW (ref 36.0–46.0)
Hemoglobin: 12.2 g/dL (ref 12.0–15.0)
Immature Granulocytes: 0 %
Lymphocytes Relative: 30 %
Lymphs Abs: 0.8 10*3/uL (ref 0.7–4.0)
MCH: 29.5 pg (ref 26.0–34.0)
MCHC: 35 g/dL (ref 30.0–36.0)
MCV: 84.5 fL (ref 80.0–100.0)
Monocytes Absolute: 0.1 10*3/uL (ref 0.1–1.0)
Monocytes Relative: 5 %
Neutro Abs: 1.6 10*3/uL — ABNORMAL LOW (ref 1.7–7.7)
Neutrophils Relative %: 62 %
Platelet Count: 141 10*3/uL — ABNORMAL LOW (ref 150–400)
RBC: 4.13 MIL/uL (ref 3.87–5.11)
RDW: 12.4 % (ref 11.5–15.5)
WBC Count: 2.6 10*3/uL — ABNORMAL LOW (ref 4.0–10.5)
nRBC: 0 % (ref 0.0–0.2)

## 2022-10-09 LAB — CMP (CANCER CENTER ONLY)
ALT: 10 U/L (ref 0–44)
AST: 12 U/L — ABNORMAL LOW (ref 15–41)
Albumin: 4 g/dL (ref 3.5–5.0)
Alkaline Phosphatase: 59 U/L (ref 38–126)
Anion gap: 8 (ref 5–15)
BUN: 15 mg/dL (ref 8–23)
CO2: 25 mmol/L (ref 22–32)
Calcium: 8.7 mg/dL — ABNORMAL LOW (ref 8.9–10.3)
Chloride: 109 mmol/L (ref 98–111)
Creatinine: 0.83 mg/dL (ref 0.44–1.00)
GFR, Estimated: >60 mL/min (ref >60)
Glucose, Bld: 125 mg/dL — ABNORMAL HIGH (ref 70–99)
Potassium: 3.7 mmol/L (ref 3.5–5.1)
Sodium: 142 mmol/L (ref 135–145)
Total Bilirubin: 0.8 mg/dL (ref 0.3–1.2)
Total Protein: 6 g/dL — ABNORMAL LOW (ref 6.5–8.1)

## 2022-10-09 MED ORDER — SODIUM CHLORIDE 0.9% FLUSH
10.0000 mL | Freq: Once | INTRAVENOUS | Status: AC
  Administered 2022-10-09: 10 mL

## 2022-10-09 MED ORDER — HEPARIN SOD (PORK) LOCK FLUSH 100 UNIT/ML IV SOLN
500.0000 [IU] | Freq: Once | INTRAVENOUS | Status: AC
  Administered 2022-10-09: 500 [IU]

## 2022-10-09 NOTE — Progress Notes (Signed)
[place] [phone]   [phone]  PROGRESS NOTE  Patient Care Team: [name] as PCP - General (Physician Assistant)  Hematological/Oncological History # Plasmacytoma with Multiple Myeloma  07/29/2021: CT cervical spine shows expansile lytic lesion which almost completely replaces the normal C2 vertebral body  08/24/2021: PET CT scan shows increased metabolic activity at C2 and C7  08/25/2021: biopsy of C2 lesion showed finding consistent with plasma cell neoplasm.  09/08/2021: start radiation therapy 09/12/2021: establish care with Dr. Lorenso Courier  09/28/2021: end radiation therapy 10/10/2021: Bone marrow biopsy. Results confirm plasma cell neoplasm consistent with multiple myeloma, kappa restricted.  10/26/2021: Cycle 1 Day 1 of VRD 11/18/2021: Cycle 2 Day 1 of VRD 11/25/2021: Velcade HELD due to full body rash. Resolved with steroid therapy.  12/16/2021: Cycle 1 Day 1 of Dara/Rev/Dex 12/30/2021: HELD Daratumumab due to toxicity. Patient unable to tolerate. Requested holding the medication  01/06/2022: Cycle 1 Day 1 of Kd  02/03/2022: Cycle 2 Day 1 of Kd 03/03/2022: Cycle 3 Day 1 of Kd 03/31/2022: Cycle 4 Day 1 of Kd 04/28/2022: Cycle 5 Day 1 of Kd 05/26/2022: Cycle 6 Day 1 of Kd  06/30/2022: Cycle 7 Day 1 of Kd  07/20/2022: Cycle 8 Day 1 of Kd  Sept 2023: start of maintenance revlimid 10 mg PO daily 21 of 28 day cycles.   Interval History:  Carol [name] 66 y.o. female with medical history significant for multiple myeloma with plasmacytoma who presents for a follow up visit. The patient's last visit was on 09/04/2022.  In the interim since her last visit she has had no major changes of her health.  On exam today Carol [name] reports she has been well overall interim since her last visit.  She underwent a colonoscopy last week but has to repeat the procedure due to poor bowel prep. In addition, she underwent bilateral cataract surgery and doing well.  She reports she hasn't been exercising due to the surgery so feels a little sluggish. But otherwise, she is doing all her ADLs and working. She has a good appetite and denies any weight loss. She denies nausea, vomiting or abdominal pain. She has occasional episodes of constipation that resolves with stool softener/laxatives as needed. She denies easy bruising or signs of active bleeding.  She denies fevers, chills, night sweats, shortness of breath, chest pain or cough. She has no other complaints. Rest of the 10 point ROS is below.  MEDICAL HISTORY:  Past Medical History:  Diagnosis Date   Allergy    Anemia    Ankle edema    both ankles   Arthritis    hands,knees,elbows   Cancer ([place]) 2016   tumor kidney   Diabetes mellitus    type 2    Eczema    History of kidney stones    Hypothyroidism    Obesity    Sleep apnea    cpap machine setting of 3   Thyroid disease     SURGICAL HISTORY: Past Surgical History:  Procedure Laterality Date   BREAST BIOPSY Right 2001   CHOLECYSTECTOMY  2009   open   COLONOSCOPY     CYSTOSCOPY W/ RETROGRADES Left 11/22/2018   Procedure: CYSTOSCOPY WITH RETROGRADE PYELOGRAM;  Surgeon: Ardis Hughs, MD;  Location: [facility];  Service: Urology;  Laterality: Left;   CYSTOSCOPY WITH RETROGRADE PYELOGRAM, URETEROSCOPY AND STENT PLACEMENT Right 10/03/2021   Procedure: CYSTOSCOPY WITH RETROGRADE PYELOGRAM, URETEROSCOPY,STONE EXTRACTION AND STENT PLACEMENT;  Surgeon: Winter,  Conception Oms, MD;  Location: [facility];  Service: Urology;  Laterality: Right;   DILATATION & CURRETTAGE/HYSTEROSCOPY WITH RESECTOCOPE N/A 01/21/2013   Procedure: DILATATION & CURETTAGE/HYSTEROSCOPY WITH RESECTOCOPE AND RESECTION OF ENDOMETRIAL;  Surgeon: Selinda Orion, MD;  Location: [facility];  Service: Gynecology;  Laterality: N/A;   HARDWARE REMOVAL N/A 06/05/2022   Procedure: Removal of Posterior Cervical Hardware from Occiput to Cervical Five;  Surgeon: Judith Part, MD;  Location: [place];  Service: Neurosurgery;  Laterality: N/A;   IR IMAGING GUIDED PORT INSERTION  02/01/2022   KNEE ARTHROSCOPY     left    POLYPECTOMY     POSTERIOR CERVICAL FUSION/FORAMINOTOMY N/A 08/25/2021   Procedure: Occiput to Cervical 5 Posterior cervical instrumented fusion with open biopsy of Cervical 2 Mass. Cervical Two Ganglionectomy;  Surgeon: Judith Part, MD;  Location: [place];  Service: Neurosurgery;  Laterality: N/A;   ROBOT ASSISTED PYELOPLASTY Left 11/22/2018   Procedure: XI ROBOTIC ASSISTED ATTEMPTED PYELOPLASTY CONVERTED TO NEPHRECTOMY/LYSIS OF ADHESIONS/UMBILICAL HERNIA REPAIR;  Surgeon: Ardis Hughs, MD;  Location: [facility];  Service: Urology;  Laterality: Left;   ROBOTIC ASSITED PARTIAL NEPHRECTOMY Left 06/11/2015   Procedure: LEFT ROBOTIC ASSISTED LAPAROSCOPIC  PARTIAL NEPHRECTOMY;  Surgeon: Ardis Hughs, MD;  Location: [facility];  Service: Urology;  Laterality: Left;   ROTATOR CUFF REPAIR Left 2011   URETERAL BIOPSY Left 02/14/2017   Procedure: URETERAL BIOPSY;  Surgeon: Ardis Hughs, MD;  Location: [facility];  Service: Urology;  Laterality: Left;   URETEROSCOPY WITH HOLMIUM LASER LITHOTRIPSY Left 02/14/2017   Procedure: URETEROSCOPY LITHOTRIPSY, URETERAL BALLOON DILATION;  Surgeon: Ardis Hughs, MD;  Location: [facility];  Service: Urology;  Laterality: Left;  With STENT    SOCIAL HISTORY: Social History   Socioeconomic History   Marital status: Married    Spouse name: Not on file   Number of children: Not on file   Years of education: Not on file   Highest education level: Not on file  Occupational History   Not on file  Tobacco Use   Smoking status: Never   Smokeless tobacco: Never  Vaping Use   Vaping Use: Never used  Substance and Sexual Activity   Alcohol use: Yes    Comment: occ   Drug use: No   Sexual activity: Not on file  Other Topics Concern   Not on file  Social History Narrative   Not on file   Social  Determinants of Health   Financial Resource Strain: Low Risk  (11/22/2018)   Overall Financial Resource Strain (CARDIA)    Difficulty of Paying Living Expenses: Not hard at all  Food Insecurity: Unknown (11/22/2018)   Hunger Vital Sign    Worried About [place] in the Last Year: Patient refused    [place] in the Last Year: Patient refused  Transportation Needs: Unknown (11/22/2018)   PRAPARE - Transportation    Lack of Transportation (Medical): Patient refused    Lack of Transportation (Non-Medical): Patient refused  Physical Activity: Unknown (11/22/2018)   Exercise Vital Sign    Days of Exercise per Week: Patient refused    Minutes of Exercise per Session: Patient refused  Stress: No Stress Concern Present (11/22/2018)   [employer] [place] of Stress : Not at all  Social Connections: Unknown (11/22/2018)   Social Connection and Isolation Panel [NHANES]    Frequency of Communication with  Friends and Family: Patient refused    Frequency of Social Gatherings with Friends and Family: Patient refused    Attends Religious Services: Patient refused    Active Member of Clubs or Organizations: Patient refused    Attends Archivist Meetings: Patient refused    Marital Status: Patient refused  Intimate Partner Violence: Unknown (11/22/2018)   Humiliation, Afraid, Rape, and Kick questionnaire    Fear of Current or Ex-Partner: Patient refused    Emotionally Abused: Patient refused    Physically Abused: Patient refused    Sexually Abused: Patient refused    FAMILY HISTORY: Family History  Problem Relation Age of Onset   Cancer Father     ALLERGIES:  is allergic to morphine.  MEDICATIONS:  Current Outpatient Medications  Medication Sig Dispense Refill   Ascorbic Acid (VITAMIN C ADULT GUMMIES PO) Take 1 tablet by mouth daily.     atorvastatin (LIPITOR) 20 MG tablet Take 20 mg by mouth at bedtime.      Calcium Carbonate-Vitamin D 600-5 MG-MCG CAPS Take 1 capsule by mouth every morning.     Cholecalciferol (VITAMIN D3 PO) Take by mouth.     [facility] ASPIRIN LOW DOSE 81 MG tablet TAKE 1 TABLET (81 MG TOTAL) BY MOUTH DAILY. SWALLOW WHOLE. 90 tablet 3   Cyanocobalamin (VITAMIN B12) 1000 MCG TBCR Take 1 tablet by mouth every morning.     furosemide (LASIX) 20 MG tablet Take 20 mg by mouth daily.     KLOR-CON M20 20 MEQ tablet TAKE 1 TABLET BY MOUTH EVERY DAY 30 tablet 1   lenalidomide (REVLIMID) 10 MG capsule TAKE 1 CAPSULE BY MOUTH 1 TIME A DAY [employer] Auth # [id number]  Date obtained 09/15/22 21 capsule 0   levothyroxine (SYNTHROID) 50 MCG tablet Take 50 mcg by mouth daily before breakfast.     lidocaine-prilocaine (EMLA) cream Apply 1 application. topically as needed. 30 g 0   Magnesium Hydroxide (MILK OF MAGNESIA PO) Take 15 mLs by mouth daily as needed (constipation/diarrhea).     ondansetron (ZOFRAN) 8 MG tablet Take 1 tablet (8 mg total) by mouth every 8 (eight) hours as needed. 30 tablet 0   oxyCODONE-acetaminophen (PERCOCET) 5-325 MG tablet Take 1 tablet by mouth every 4 (four) hours as needed (pain). 30 tablet 0   OZEMPIC, 0.25 OR 0.5 MG/DOSE, 2 MG/1.5ML SOPN Inject 0.5 mg into the skin every Saturday.     prednisoLONE acetate (PRED FORTE) 1 % ophthalmic suspension Place 1 drop into the left eye 4 (four) times daily.     PROAIR HFA 108 (90 BASE) MCG/ACT inhaler Inhale 2 puffs into the lungs every 4 (four) hours as needed for wheezing or shortness of breath. Uses seasonal  3   prochlorperazine (COMPAZINE) 10 MG tablet Take 1 tablet (10 mg total) by mouth every 6 (six) hours as needed for nausea or vomiting. 30 tablet 0   PROLENSA 0.07 % SOLN Place 1 drop into the right eye at bedtime.     Sennosides (SENOKOT PO) Take 1 tablet by mouth daily as needed (constipation). Bid     Vitamin D, Ergocalciferol, (DRISDOL) 1.25 MG (50000 UNIT) CAPS capsule Take 50,000 Units by mouth once a week.     acyclovir  (ZOVIRAX) 400 MG tablet TAKE 1 TABLET BY MOUTH TWICE A DAY (Patient not taking: Reported on 10/09/2022) 180 tablet 1   No current facility-administered medications for this visit.    REVIEW OF SYSTEMS:   Constitutional: ( - ) fevers, ( - )  chills , ( - ) night sweats Eyes: ( - ) blurriness of vision, ( - ) double vision, ( - ) watery eyes Ears, nose, mouth, throat, and face: ( - ) mucositis, ( - ) sore throat Respiratory: ( - ) cough, ( - ) dyspnea, ( - ) wheezes Cardiovascular: ( - ) palpitation, ( - ) chest discomfort, ( - ) lower extremity swelling Gastrointestinal:  ( - ) nausea, ( - ) heartburn, ( -) change in bowel habits Skin: ( - ) abnormal skin rashes Lymphatics: ( - ) new lymphadenopathy, ( - ) easy bruising Neurological: ( - ) numbness, ( - ) tingling, ( - ) new weaknesses Behavioral/Psych: ( - ) mood change, ( - ) new changes  All other systems were reviewed with the patient and are negative.  PHYSICAL EXAMINATION: ECOG PERFORMANCE STATUS: 1 - Symptomatic but completely ambulatory  Vitals:   10/09/22 0825  BP: (!) 108/58  Pulse: 75  Resp: 16  Temp: 98.4 F (36.9 C)  SpO2: 100%      Filed Weights   10/09/22 0825  Weight: 164 lb 9.6 oz (74.7 kg)     GENERAL: Well-appearing middle-age Caucasian female alert, no distress and comfortable SKIN: No evidence of rash, erythema, dry skin, or raised lesions. EYES: conjunctiva are pink and non-injected, sclera clear LUNGS: clear to auscultation and percussion with normal breathing effort HEART: regular rate & rhythm and no murmurs and no lower extremity edema Musculoskeletal: no cyanosis of digits and no clubbing  PSYCH: alert & oriented x 3, fluent speech NEURO: no focal motor/sensory deficits  LABORATORY DATA:  I have reviewed the data as listed    Latest Ref Rng & Units 09/04/2022   11:18 AM 08/04/2022    8:07 AM 07/28/2022    8:27 AM  CBC  WBC 4.0 - 10.5 K/uL 2.4  5.0  3.2   Hemoglobin 12.0 - 15.0 g/dL 12.4   12.4  11.8   Hematocrit 36.0 - 46.0 % 35.6  35.5  33.0   Platelets 150 - 400 K/uL 168  105  113        Latest Ref Rng & Units 09/04/2022   11:18 AM 08/04/2022    8:07 AM 07/28/2022    8:27 AM  CMP  Glucose 70 - 99 mg/dL 120  133  158   BUN 8 - 23 mg/dL 18  34  26   Creatinine 0.44 - 1.00 mg/dL 0.93  0.83  0.83   Sodium 135 - 145 mmol/L 140  140  140   Potassium 3.5 - 5.1 mmol/L 3.5  4.5  4.3   Chloride 98 - 111 mmol/L 106  110  109   CO2 22 - 32 mmol/L _0 Calcium 8.9 - 10.3 mg/dL 9.0  9.1  8.9   Total Protein 6.5 - 8.1 g/dL 6.3  6.2  5.8   Total Bilirubin 0.3 - 1.2 mg/dL 0.9  0.8  0.6   Alkaline Phos 38 - 126 U/L 67  73  76   AST 15 - 41 U/L _1 ALT 0 - 44 U/L _2 Lab Results  Component Value Date   MPROTEIN Not Observed 09/04/2022   MPROTEIN Not Observed 08/04/2022   MPROTEIN Not Observed 07/20/2022   Lab Results  Component Value Date   KPAFRELGTCHN 9.2 09/04/2022   KPAFRELGTCHN 3.6 07/20/2022   KPAFRELGTCHN 5.2 06/16/2022  LAMBDASER 6.2 09/04/2022   LAMBDASER 3.1 (L) 07/20/2022   LAMBDASER 3.0 (L) 06/16/2022   KAPLAMBRATIO 1.48 09/04/2022   KAPLAMBRATIO 1.16 07/20/2022   KAPLAMBRATIO 1.73 (H) 06/16/2022    RADIOGRAPHIC STUDIES: No results found.  ASSESSMENT & PLAN ANEEKA BOWDEN 65 y.o. female with medical history significant for multiple myeloma with plasmacytoma who presents for a follow up visit.   # Free Kappa Multiple Myeloma # Plasmacytoma  -- original finding of L2 plasmacytoma on biopsy of back lesion, bone marrow biopsy confirms greater than 60% plasma cells consistent with multiple myeloma. --Cycle 1 Day 1 of VRD treatment started on 10/26/2021.  --VRD therapy stopped on 11/25/2021 due to rash. Started Dara/Rev/Dex on 12/16/2021.  --will order monthly SPEP, UPEP, SFLC and beta 2 microglobulin --weekly CBC, CMP, and LDH --Cycle 1 Day 1 of Kd started on 01/06/2022 Plan:  --Labs show white blood cell count 2.6, hemoglobin 12.2,  MCV 84.5, and platelets of 141 --Patient is currently on maintenance Revlimid 10 mg daily 21 to 28-day cycle. --Continue monthly protein labs with SPEP and serum free light chains. --RTC in 4 weeks for monitoring on therapy.   #Leukopenia/Thrombocytopenia/Anemia --Likely secondary to chemotherapy --continue to monitor.   #Rash--resolved --Present for approximately one week --Received IV solumedrol and medrol dose pak.  --Suspect drug induced (velcade)  #Thrush, resolved: --Treated with Nystatin mouthwash with improvement.   #Metallic taste/weight loss-improving:  --Encouraged to increase caloric intake and supplement with protein shakes.  #Supportive Care -- chemotherapy education complete  -- port placement complete. -- sent prescription for EMLA cream -- zofran 37m q8H PRN and compazine 125mPO q6H for nausea -- Started zometa on 02/03/2022. Will continue q 3 months.HOLD zometa in June 2023 due to upcoming spine surgery. Last dose on 07/28/2022, next due in Dec 2023.  -- no pain medication required at this time.   No orders of the defined types were placed in this encounter.  All questions were answered. The patient knows to call the clinic with any problems, questions or concerns.  I have spent a total of 30 minutes minutes of face-to-face and non-face-to-face time, preparing to see the patient, performing a medically appropriate examination, counseling and educating the patient, ordering medications/tests, documenting clinical information in the electronic health record, and care coordination.   IrDede QueryA-C Dept of Hematology and OnLenorat WeLas [facility] - [facility]ty Hospitalhone: [phone] 10/09/2022 8:37 AM

## 2022-10-10 ENCOUNTER — Other Ambulatory Visit: Payer: Self-pay

## 2022-10-11 ENCOUNTER — Other Ambulatory Visit: Payer: Self-pay | Admitting: Hematology and Oncology

## 2022-10-13 ENCOUNTER — Other Ambulatory Visit: Payer: Self-pay | Admitting: *Deleted

## 2022-10-13 MED ORDER — LENALIDOMIDE 10 MG PO CAPS: ORAL_CAPSULE | ORAL | 0 refills | Status: DC

## 2022-10-16 LAB — MULTIPLE MYELOMA PANEL, SERUM
Albumin SerPl Elph-Mcnc: 3.5 g/dL (ref 2.9–4.4)
Albumin/Glob SerPl: 2 — ABNORMAL HIGH (ref 0.7–1.7)
Alpha 1: 0.2 g/dL (ref 0.0–0.4)
Alpha2 Glob SerPl Elph-Mcnc: 0.5 g/dL (ref 0.4–1.0)
B-Globulin SerPl Elph-Mcnc: 0.8 g/dL (ref 0.7–1.3)
Gamma Glob SerPl Elph-Mcnc: 0.3 g/dL — ABNORMAL LOW (ref 0.4–1.8)
Globulin, Total: 1.8 g/dL — ABNORMAL LOW (ref 2.2–3.9)
IgA: 66 mg/dL — ABNORMAL LOW (ref 87–352)
IgG (Immunoglobin G), Serum: 308 mg/dL — ABNORMAL LOW (ref 586–1602)
IgM (Immunoglobulin M), Srm: 18 mg/dL — ABNORMAL LOW (ref 26–217)
Total Protein ELP: 5.3 g/dL — ABNORMAL LOW (ref 6.0–8.5)

## 2022-10-19 ENCOUNTER — Other Ambulatory Visit: Payer: Self-pay

## 2022-10-29 IMAGING — CT CT BIOPSY AND ASPIRATION BONE MARROW
1 of 3 series · 15 of 25 positions shown, 19 images · non-contrast
Comparison: none

INDICATION: 64-year-old female with clinical concern for multiple myeloma.

[Series 2: i-spiral 5.0 br40 · axial · 0.98mm/px · z∈[+1232,+1288]mm · 15 of 19 slices shown, 19 images]
[im 2/19  mediastinal]
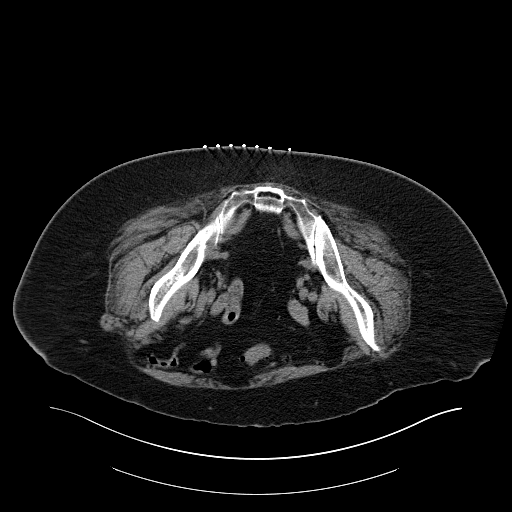
[im 2/19  lung]
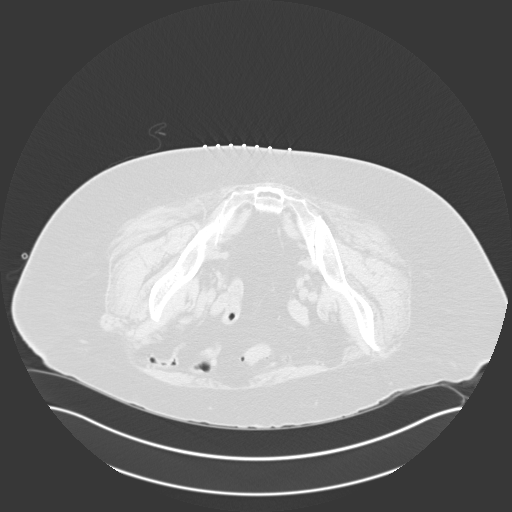
[im 3/19  lung]
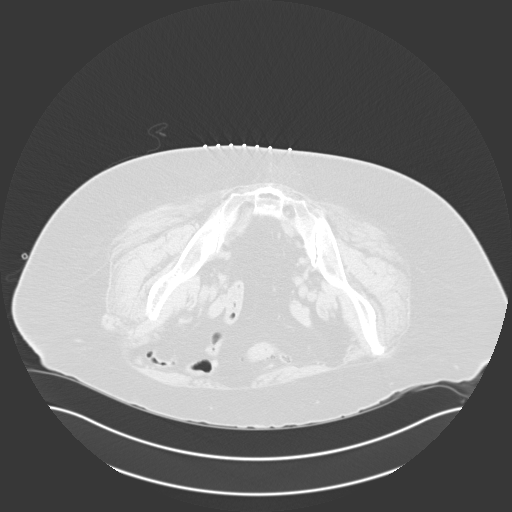
[im 4/19  lung]
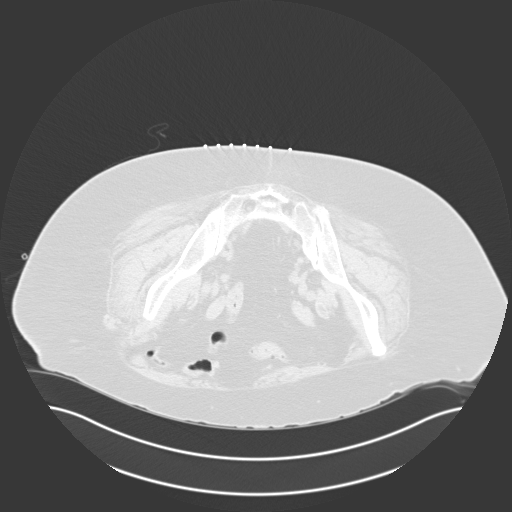
[im 5/19  lung]
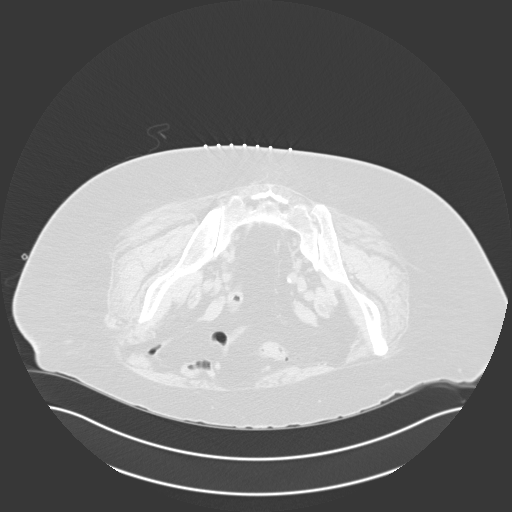
[im 7/19  mediastinal]
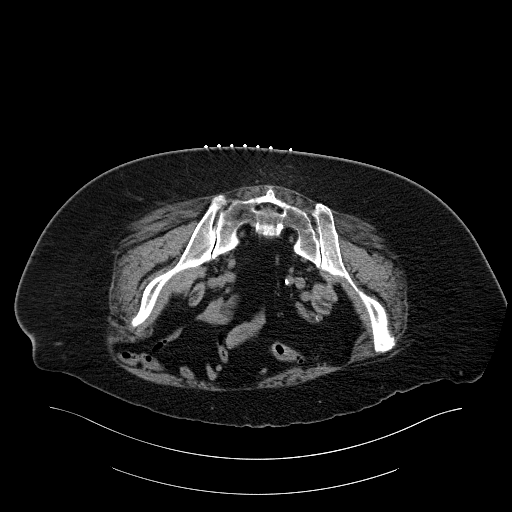
[im 7/19  lung]
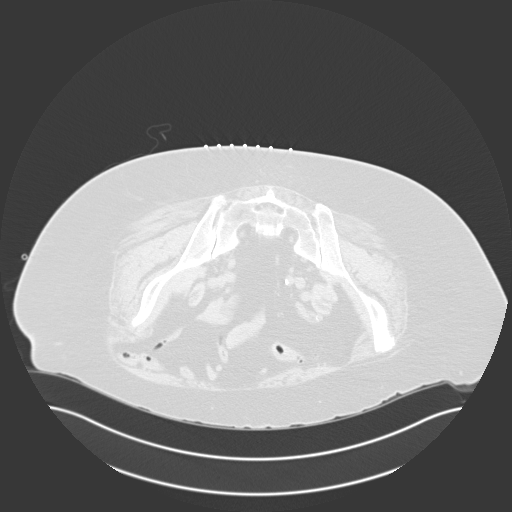
[im 8/19  lung]
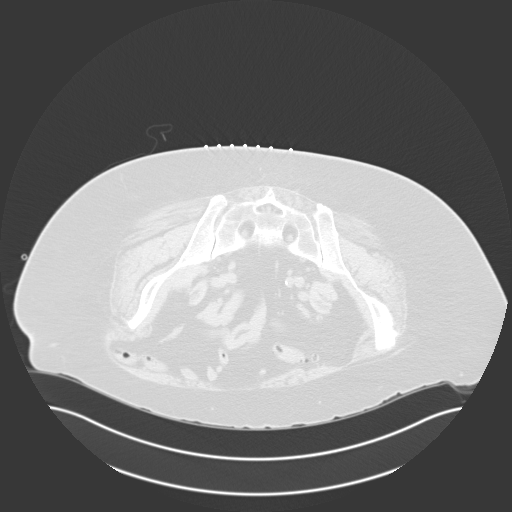
[im 9/19  lung]
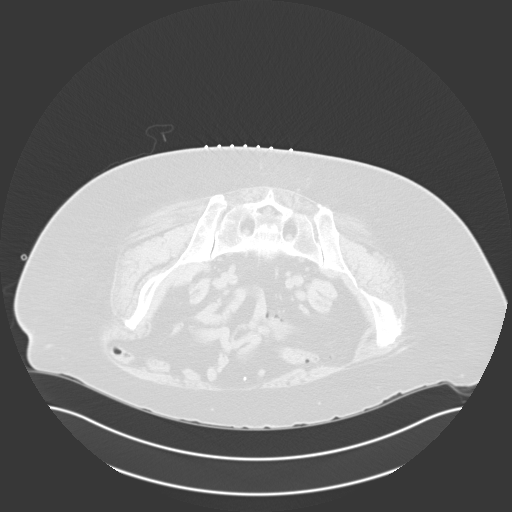
[im 10/19  lung]
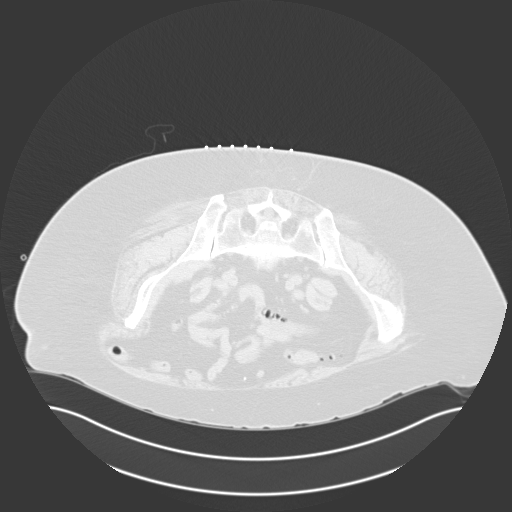
[im 11/19  mediastinal]
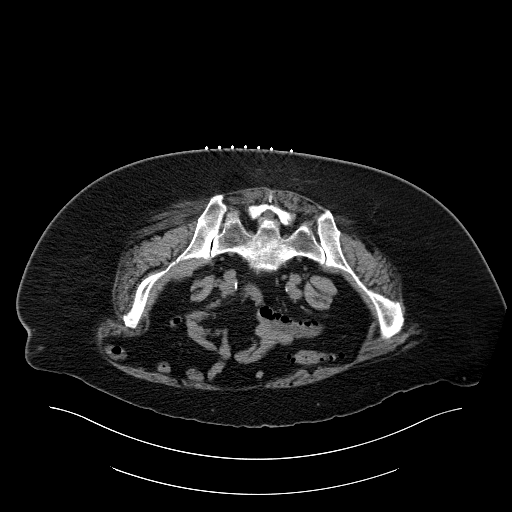
[im 11/19  lung]
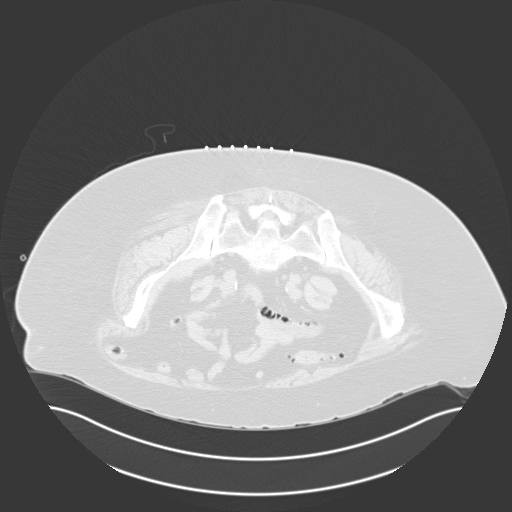
[im 12/19  lung]
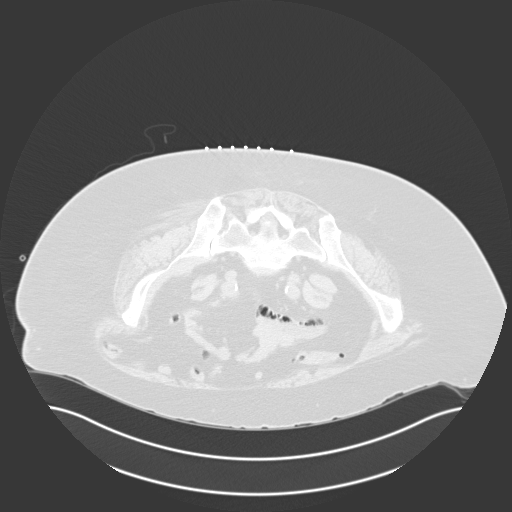
[im 13/19  lung]
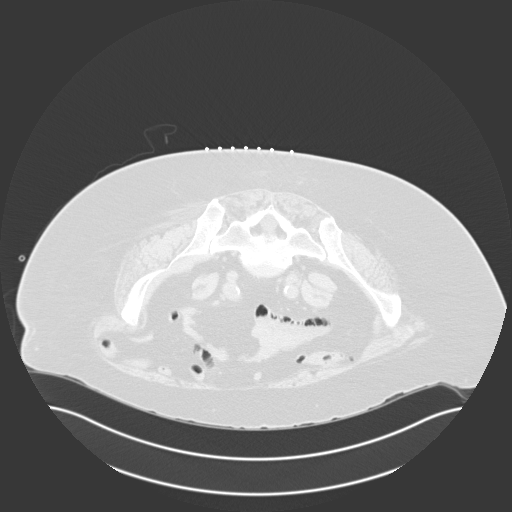
[im 15/19  lung]
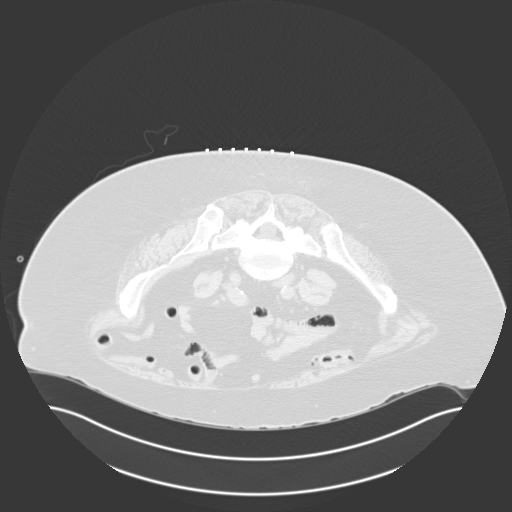
[im 16/19  mediastinal]
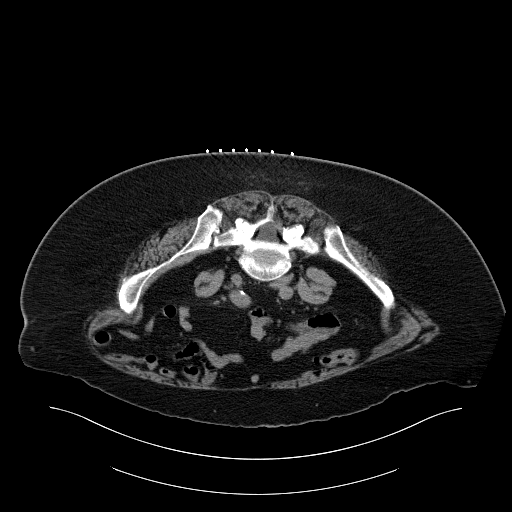
[im 16/19  lung]
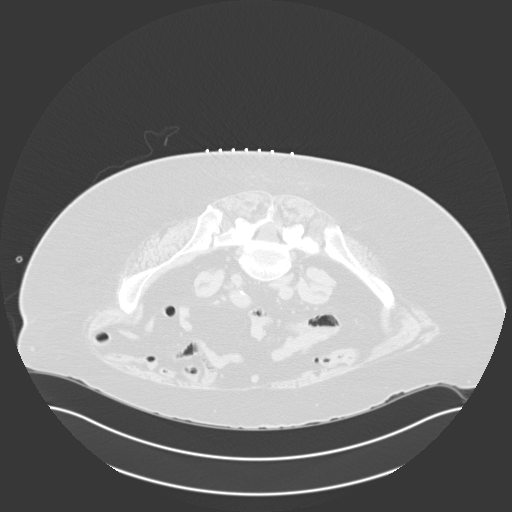
[im 17/19  lung]
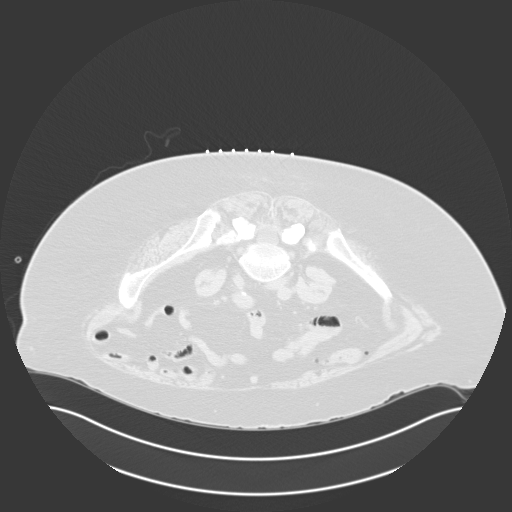
[im 18/19  lung]
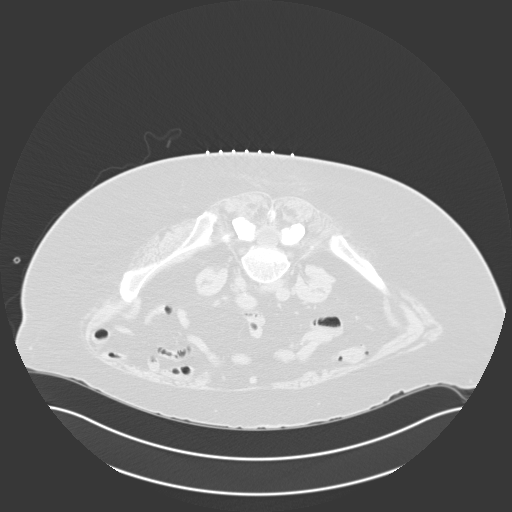

[15 of 25 positions shown; findings below may reference images not displayed]

EXAM:
CT-GUIDED BONE MARROW BIOPSY AND ASPIRATION

MEDICATIONS:
None

ANESTHESIA/SEDATION:
Fentanyl 100 mcg IV; Versed 3 mg IV

Sedation Time: 10 minutes; The patient was continuously monitored
during the procedure by the interventional radiology nurse under my
direct supervision.

COMPLICATIONS:
None immediate.

PROCEDURE:
Informed consent was obtained from the patient following an
explanation of the procedure, risks, benefits and alternatives. The
patient understands, agrees and consents for the procedure. All
questions were addressed. A time out was performed prior to the
initiation of the procedure.

The patient was positioned prone and non-contrast localization CT
was performed of the pelvis to demonstrate the iliac marrow spaces.
The operative site was prepped and draped in the usual sterile
fashion.

Under sterile conditions and local anesthesia, a 22 gauge spinal
needle was utilized for procedural planning. Next, an 11 gauge
coaxial bone biopsy needle was advanced into the right iliac marrow
space. Needle position was confirmed with CT imaging. Initially, a
bone marrow aspiration was performed. Next, a bone marrow biopsy was
obtained with the 11 gauge outer bone marrow device. The 11 gauge
coaxial bone biopsy needle was re-advanced into a slightly different
location within the right iliac marrow space, positioning was
confirmed with CT imaging and an additional bone marrow biopsy was
obtained. Samples were prepared with the cytotechnologist and deemed
adequate. The needle was removed and superficial hemostasis was
obtained with manual compression. A dressing was applied. The
patient tolerated the procedure well without immediate post
procedural complication.
IMPRESSION: Successful CT guided right iliac bone marrow aspiration and core
biopsy.

## 2022-10-29 IMAGING — CT CT BIOPSY
1 of 3 series · 15 of 25 positions shown, 19 images · non-contrast
Comparison: none

INDICATION: 64-year-old female with clinical concern for multiple myeloma.

[Series 2: i-spiral 5.0 br40 · axial · 0.98mm/px · z∈[+1232,+1288]mm · 15 of 19 slices shown, 19 images]
[im 2/19  mediastinal]
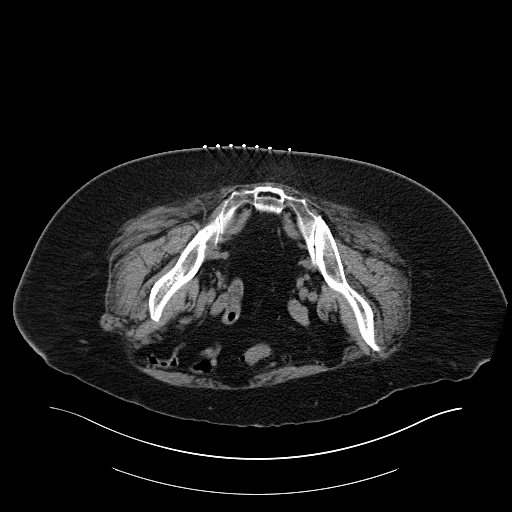
[im 2/19  lung]
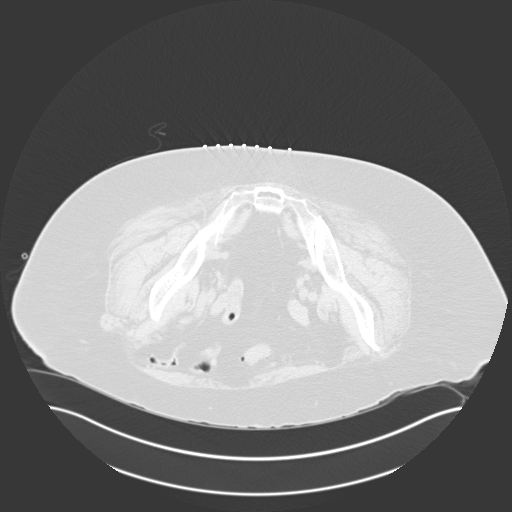
[im 3/19  lung]
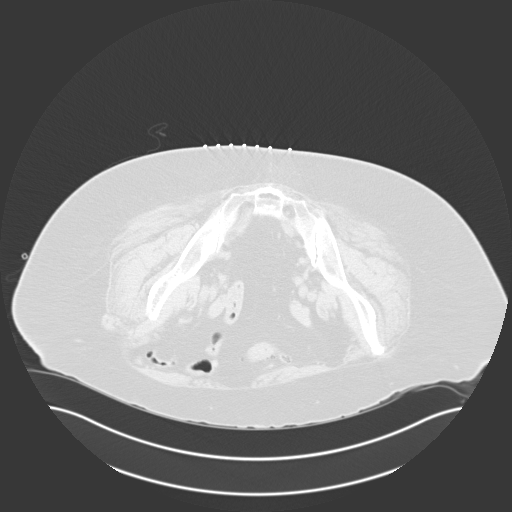
[im 4/19  lung]
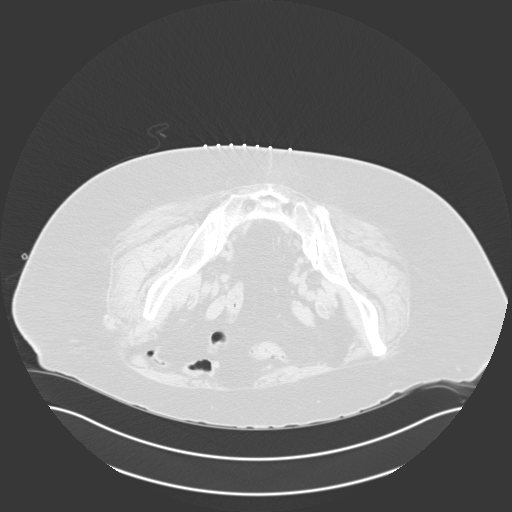
[im 5/19  lung]
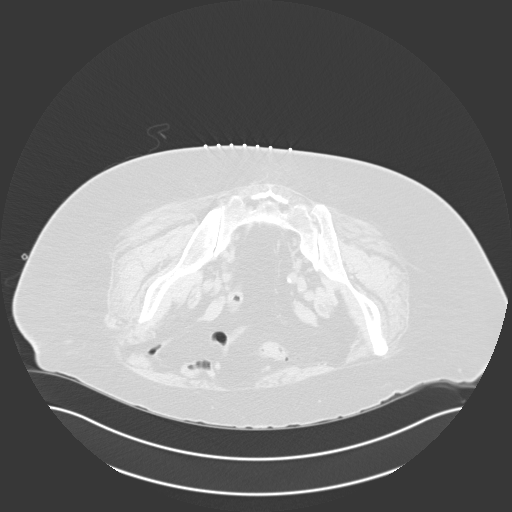
[im 7/19  mediastinal]
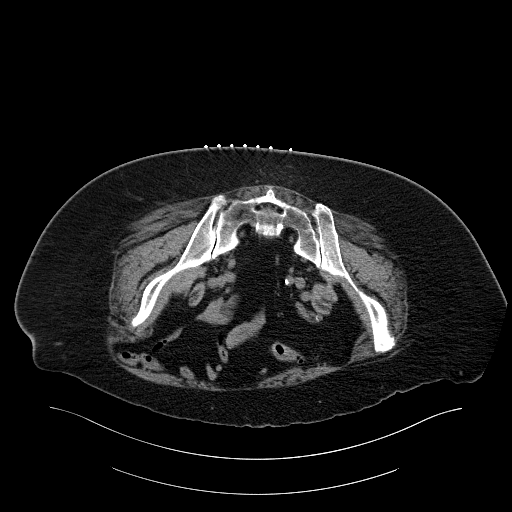
[im 7/19  lung]
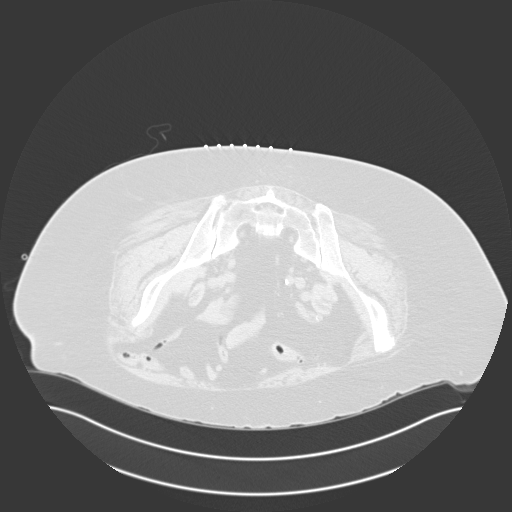
[im 8/19  lung]
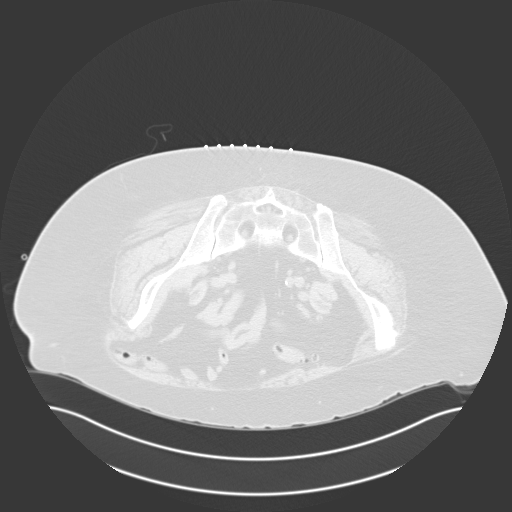
[im 9/19  lung]
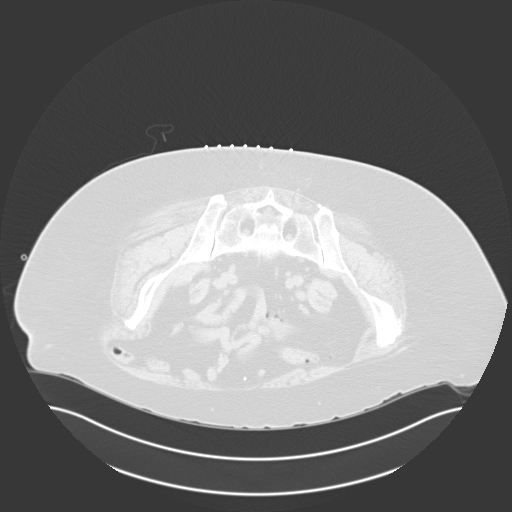
[im 10/19  lung]
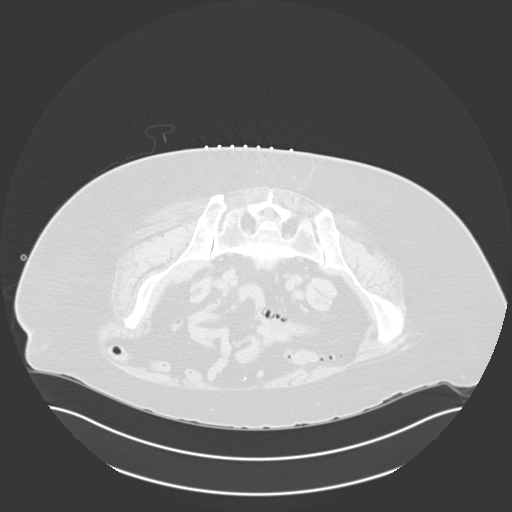
[im 11/19  mediastinal]
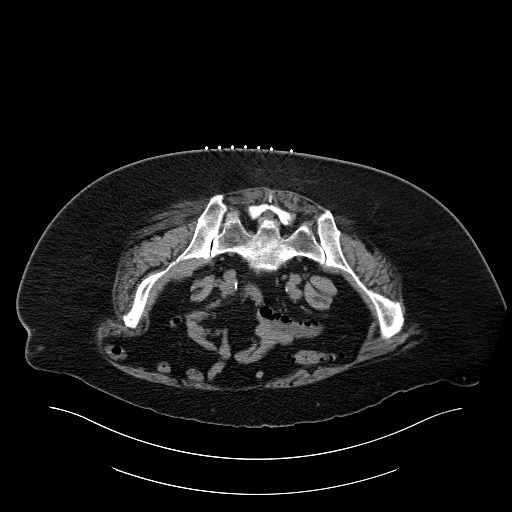
[im 11/19  lung]
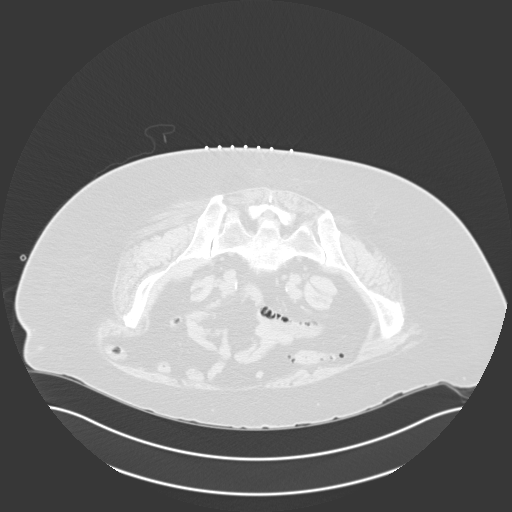
[im 12/19  lung]
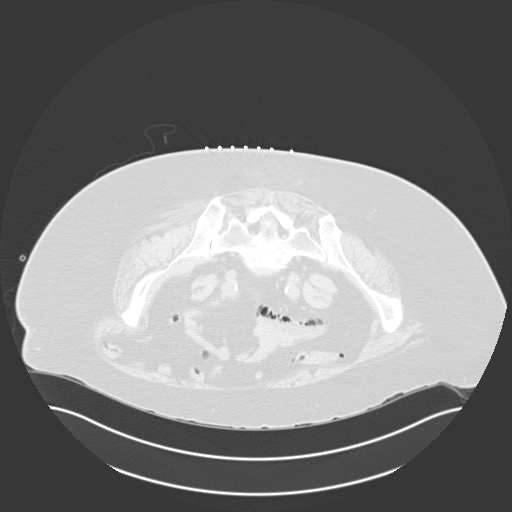
[im 13/19  lung]
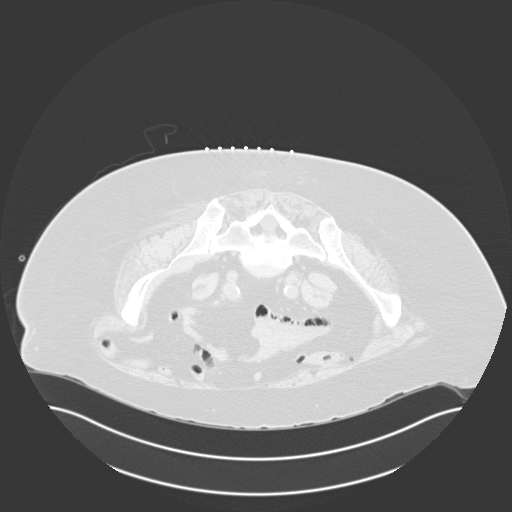
[im 15/19  lung]
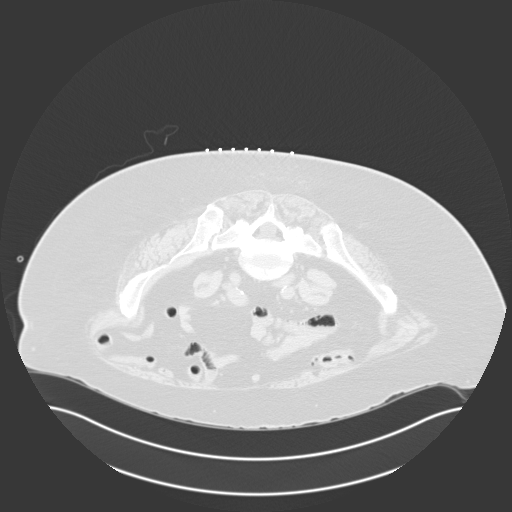
[im 16/19  mediastinal]
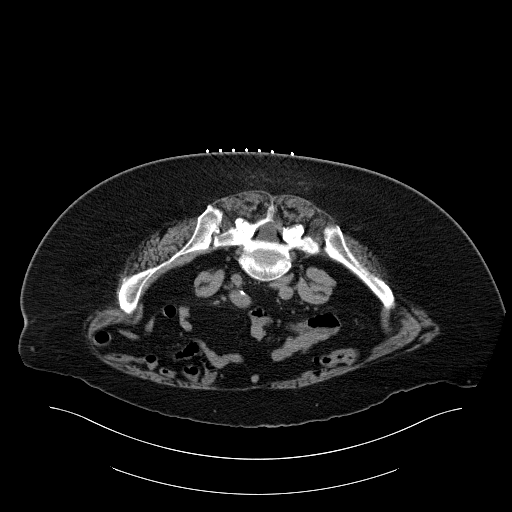
[im 16/19  lung]
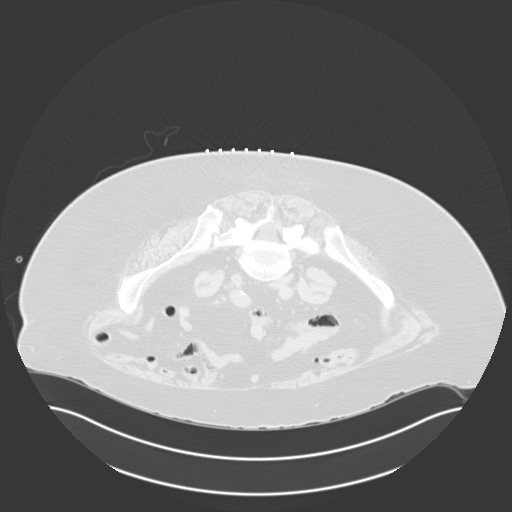
[im 17/19  lung]
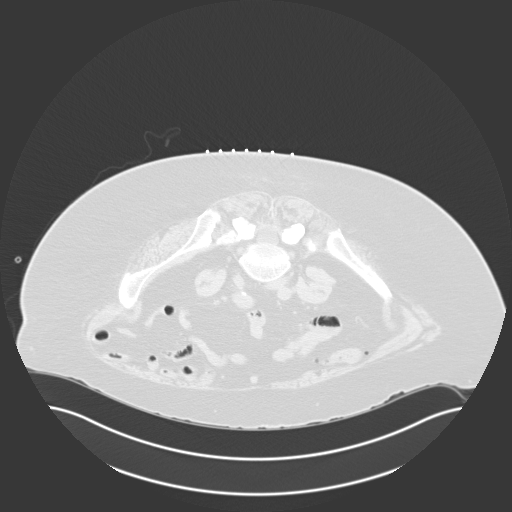
[im 18/19  lung]
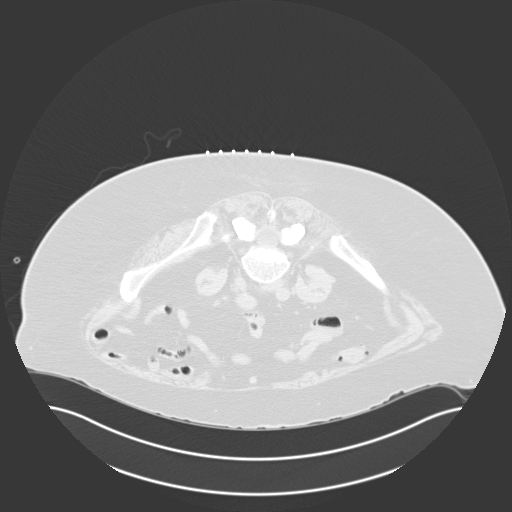

[15 of 25 positions shown; findings below may reference images not displayed]

EXAM:
CT-GUIDED BONE MARROW BIOPSY AND ASPIRATION

MEDICATIONS:
None

ANESTHESIA/SEDATION:
Fentanyl 100 mcg IV; Versed 3 mg IV

Sedation Time: 10 minutes; The patient was continuously monitored
during the procedure by the interventional radiology nurse under my
direct supervision.

COMPLICATIONS:
None immediate.

PROCEDURE:
Informed consent was obtained from the patient following an
explanation of the procedure, risks, benefits and alternatives. The
patient understands, agrees and consents for the procedure. All
questions were addressed. A time out was performed prior to the
initiation of the procedure.

The patient was positioned prone and non-contrast localization CT
was performed of the pelvis to demonstrate the iliac marrow spaces.
The operative site was prepped and draped in the usual sterile
fashion.

Under sterile conditions and local anesthesia, a 22 gauge spinal
needle was utilized for procedural planning. Next, an 11 gauge
coaxial bone biopsy needle was advanced into the right iliac marrow
space. Needle position was confirmed with CT imaging. Initially, a
bone marrow aspiration was performed. Next, a bone marrow biopsy was
obtained with the 11 gauge outer bone marrow device. The 11 gauge
coaxial bone biopsy needle was re-advanced into a slightly different
location within the right iliac marrow space, positioning was
confirmed with CT imaging and an additional bone marrow biopsy was
obtained. Samples were prepared with the cytotechnologist and deemed
adequate. The needle was removed and superficial hemostasis was
obtained with manual compression. A dressing was applied. The
patient tolerated the procedure well without immediate post
procedural complication.
IMPRESSION: Successful CT guided right iliac bone marrow aspiration and core
biopsy.

## 2022-11-03 ENCOUNTER — Other Ambulatory Visit: Payer: Self-pay | Admitting: Hematology and Oncology

## 2022-11-03 ENCOUNTER — Other Ambulatory Visit: Payer: Self-pay | Admitting: *Deleted

## 2022-11-03 MED ORDER — LENALIDOMIDE 10 MG PO CAPS: ORAL_CAPSULE | ORAL | 0 refills | Status: DC

## 2022-11-08 ENCOUNTER — Telehealth: Payer: Self-pay | Admitting: *Deleted

## 2022-11-08 NOTE — Telephone Encounter (Signed)
Received call from pt. She states she has Covid and needs to reschedule her appt on 11/10/22. No answer but was able to leave vm message regarding her appt on 11/10/22. Advised that we will cancel that appt and plan on seeing her at her next scheduled appt on 12/08/22. Advised to call back @ [phone] with any questions or concerns.

## 2022-11-09 ENCOUNTER — Other Ambulatory Visit: Payer: Self-pay | Admitting: Hematology and Oncology

## 2022-11-10 ENCOUNTER — Ambulatory Visit: Payer: BC Managed Care – PPO | Admitting: Hematology and Oncology

## 2022-11-10 ENCOUNTER — Other Ambulatory Visit: Payer: BC Managed Care – PPO

## 2022-11-14 ENCOUNTER — Encounter: Payer: Self-pay | Admitting: Hematology and Oncology

## 2022-11-15 ENCOUNTER — Encounter: Payer: Self-pay | Admitting: Hematology and Oncology

## 2022-11-15 ENCOUNTER — Telehealth: Payer: Self-pay | Admitting: Pharmacy Technician

## 2022-11-15 ENCOUNTER — Other Ambulatory Visit (HOSPITAL_COMMUNITY): Payer: Self-pay

## 2022-11-15 NOTE — Telephone Encounter (Signed)
Oral Oncology Patient Advocate Encounter   Received notification that prior authorization for Lenalidomide is due for renewal.   PA submitted on 11/15/22 Key [id number] Status is pending     Lady Deutscher, CPhT-Adv Oncology Pharmacy Patient [place] Direct Number: [phone]  Fax: [phone]

## 2022-11-15 NOTE — Telephone Encounter (Signed)
Oral Oncology Patient Advocate Encounter  Prior Authorization for lenalidomide has been approved.    PA# [id number] Effective dates: 11/15/22 through 11/15/23  Patient may continue to fill at [facility].    Lady Deutscher, CPhT-Adv Oncology Pharmacy Patient [place] Direct Number: [phone]  Fax: [phone]

## 2022-12-07 ENCOUNTER — Other Ambulatory Visit: Payer: Self-pay | Admitting: Hematology and Oncology

## 2022-12-07 ENCOUNTER — Other Ambulatory Visit: Payer: Self-pay

## 2022-12-07 MED ORDER — LENALIDOMIDE 10 MG PO CAPS: ORAL_CAPSULE | ORAL | 0 refills | Status: DC

## 2022-12-08 ENCOUNTER — Other Ambulatory Visit: Payer: Self-pay | Admitting: Hematology and Oncology

## 2022-12-08 ENCOUNTER — Telehealth: Payer: Self-pay | Admitting: Hematology and Oncology

## 2022-12-08 ENCOUNTER — Inpatient Hospital Stay: Payer: BC Managed Care – PPO | Attending: Radiation Oncology

## 2022-12-08 ENCOUNTER — Inpatient Hospital Stay: Payer: BC Managed Care – PPO | Admitting: Hematology and Oncology

## 2022-12-08 VITALS — BP 122/71 | HR 74 | Temp 98.2°F | Resp 16 | Wt 173.2 lb

## 2022-12-08 DIAGNOSIS — Z79899 Other long term (current) drug therapy: Secondary | ICD-10-CM | POA: Insufficient documentation

## 2022-12-08 DIAGNOSIS — C9001 Multiple myeloma in remission: Secondary | ICD-10-CM

## 2022-12-08 DIAGNOSIS — D649 Anemia, unspecified: Secondary | ICD-10-CM | POA: Insufficient documentation

## 2022-12-08 DIAGNOSIS — Z95828 Presence of other vascular implants and grafts: Secondary | ICD-10-CM | POA: Diagnosis not present

## 2022-12-08 DIAGNOSIS — D696 Thrombocytopenia, unspecified: Secondary | ICD-10-CM | POA: Insufficient documentation

## 2022-12-08 DIAGNOSIS — D72819 Decreased white blood cell count, unspecified: Secondary | ICD-10-CM | POA: Diagnosis not present

## 2022-12-08 DIAGNOSIS — Z8616 Personal history of COVID-19: Secondary | ICD-10-CM | POA: Insufficient documentation

## 2022-12-08 DIAGNOSIS — C9000 Multiple myeloma not having achieved remission: Secondary | ICD-10-CM | POA: Insufficient documentation

## 2022-12-08 LAB — CBC WITH DIFFERENTIAL (CANCER CENTER ONLY)
Abs Immature Granulocytes: 0 10*3/uL (ref 0.00–0.07)
Basophils Absolute: 0 10*3/uL (ref 0.0–0.1)
Basophils Relative: 1 %
Eosinophils Absolute: 0.1 10*3/uL (ref 0.0–0.5)
Eosinophils Relative: 4 %
HCT: 34.5 % — ABNORMAL LOW (ref 36.0–46.0)
Hemoglobin: 12.1 g/dL (ref 12.0–15.0)
Immature Granulocytes: 0 %
Lymphocytes Relative: 29 %
Lymphs Abs: 0.8 10*3/uL (ref 0.7–4.0)
MCH: 29.1 pg (ref 26.0–34.0)
MCHC: 35.1 g/dL (ref 30.0–36.0)
MCV: 82.9 fL (ref 80.0–100.0)
Monocytes Absolute: 0.4 10*3/uL (ref 0.1–1.0)
Monocytes Relative: 13 %
Neutro Abs: 1.5 10*3/uL — ABNORMAL LOW (ref 1.7–7.7)
Neutrophils Relative %: 53 %
Platelet Count: 106 10*3/uL — ABNORMAL LOW (ref 150–400)
RBC: 4.16 MIL/uL (ref 3.87–5.11)
RDW: 15.9 % — ABNORMAL HIGH (ref 11.5–15.5)
WBC Count: 2.8 10*3/uL — ABNORMAL LOW (ref 4.0–10.5)
nRBC: 0 % (ref 0.0–0.2)

## 2022-12-08 LAB — LACTATE DEHYDROGENASE: LDH: 152 U/L (ref 98–192)

## 2022-12-08 LAB — CMP (CANCER CENTER ONLY)
ALT: 9 U/L (ref 0–44)
AST: 12 U/L — ABNORMAL LOW (ref 15–41)
Albumin: 3.9 g/dL (ref 3.5–5.0)
Alkaline Phosphatase: 70 U/L (ref 38–126)
Anion gap: 7 (ref 5–15)
BUN: 18 mg/dL (ref 8–23)
CO2: 27 mmol/L (ref 22–32)
Calcium: 9.2 mg/dL (ref 8.9–10.3)
Chloride: 104 mmol/L (ref 98–111)
Creatinine: 1.1 mg/dL — ABNORMAL HIGH (ref 0.44–1.00)
GFR, Estimated: 55 mL/min — ABNORMAL LOW (ref >60)
Glucose, Bld: 128 mg/dL — ABNORMAL HIGH (ref 70–99)
Potassium: 3.4 mmol/L — ABNORMAL LOW (ref 3.5–5.1)
Sodium: 138 mmol/L (ref 135–145)
Total Bilirubin: 1 mg/dL (ref 0.3–1.2)
Total Protein: 6 g/dL — ABNORMAL LOW (ref 6.5–8.1)

## 2022-12-08 MED ORDER — SODIUM CHLORIDE 0.9% FLUSH
10.0000 mL | Freq: Once | INTRAVENOUS | Status: AC
  Administered 2022-12-08: 10 mL

## 2022-12-08 MED ORDER — HEPARIN SOD (PORK) LOCK FLUSH 100 UNIT/ML IV SOLN
500.0000 [IU] | Freq: Once | INTRAVENOUS | Status: AC
  Administered 2022-12-08: 500 [IU]

## 2022-12-08 NOTE — Telephone Encounter (Signed)
Called patient per 1/19 los notes to schedule f/u. Patient scheduled and notified.

## 2022-12-08 NOTE — Progress Notes (Signed)
[place] Telephone:([phone]) [phone]   Fax:([phone]  PROGRESS NOTE  Patient Care Team: [name] as PCP - General (Physician Assistant)  Hematological/Oncological History # Plasmacytoma with Multiple Myeloma  07/29/2021: CT cervical spine shows expansile lytic lesion which almost completely replaces the normal C2 vertebral body  08/24/2021: PET CT scan shows increased metabolic activity at C2 and C7  08/25/2021: biopsy of C2 lesion showed finding consistent with plasma cell neoplasm.  09/08/2021: start radiation therapy 09/12/2021: establish care with Dr. Lorenso Courier  09/28/2021: end radiation therapy 10/10/2021: Bone marrow biopsy. Results confirm plasma cell neoplasm consistent with multiple myeloma, kappa restricted.  10/26/2021: Cycle 1 Day 1 of VRD 11/18/2021: Cycle 2 Day 1 of VRD 11/25/2021: Velcade HELD due to full body rash. Resolved with steroid therapy.  12/16/2021: Cycle 1 Day 1 of Dara/Rev/Dex 12/30/2021: HELD Daratumumab due to toxicity. Patient unable to tolerate. Requested holding the medication  01/06/2022: Cycle 1 Day 1 of Kd  02/03/2022: Cycle 2 Day 1 of Kd 03/03/2022: Cycle 3 Day 1 of Kd 03/31/2022: Cycle 4 Day 1 of Kd 04/28/2022: Cycle 5 Day 1 of Kd 05/26/2022: Cycle 6 Day 1 of Kd  06/30/2022: Cycle 7 Day 1 of Kd  07/20/2022: Cycle 8 Day 1 of Kd  Sept 2023: start of maintenance revlimid 10 mg PO daily 21 of 28 day cycles.   Interval History:  Carol [name] 67 y.o. female with medical history significant for multiple myeloma with plasmacytoma who presents for a follow up visit. The patient's last visit was on 10/09/2022.  In the interim since her last visit she has had no major changes of her health.  On exam today Carol [name] reports she has an upcoming sigmoidoscopy.  This is due to an abnormal finding that was noted during colonoscopy previously.  She notes that over the Christmas holiday everyone in her family got COVID including her, she reports  that she had only mild symptoms though everyone else had bad sore throat, tiredness, and runny nose.  She attributes this to her vaccination.  She reports that over Thanksgiving she did have a sinus infection was on 5 weeks of antibiotics.  Otherwise with a "quite Christmas".  She notes that she is taking the Revlimid and tolerating it well with no major side effects.  She reports that she is feeling good and her energy is "okay".  She is still recovering from her COVID and sinus infections.  She reports her sigmoidoscopy is coming up on 12/21/2022.  It will be done at [facility].  She otherwise reports no major changes in her health.  She denies fevers, chills, night sweats, shortness of breath, chest pain or cough. She has no other complaints. Rest of the 10 point ROS is below.  MEDICAL HISTORY:  Past Medical History:  Diagnosis Date   Allergy    Anemia    Ankle edema    both ankles   Arthritis    hands,knees,elbows   Cancer ([place]) 2016   tumor kidney   Diabetes mellitus    type 2    Eczema    History of kidney stones    Hypothyroidism    Obesity    Sleep apnea    cpap machine setting of 3   Thyroid disease     SURGICAL HISTORY: Past Surgical History:  Procedure Laterality Date   BREAST BIOPSY Right 2001   CHOLECYSTECTOMY  2009   open   COLONOSCOPY     CYSTOSCOPY W/ RETROGRADES  Left 11/22/2018   Procedure: CYSTOSCOPY WITH RETROGRADE PYELOGRAM;  Surgeon: Ardis Hughs, MD;  Location: [facility];  Service: Urology;  Laterality: Left;   CYSTOSCOPY WITH RETROGRADE PYELOGRAM, URETEROSCOPY AND STENT PLACEMENT Right 10/03/2021   Procedure: CYSTOSCOPY WITH RETROGRADE PYELOGRAM, URETEROSCOPY,STONE EXTRACTION AND STENT PLACEMENT;  Surgeon: Ceasar Mons, MD;  Location: [facility];  Service: Urology;  Laterality: Right;   DILATATION & CURRETTAGE/HYSTEROSCOPY WITH RESECTOCOPE N/A 01/21/2013   Procedure: DILATATION & CURETTAGE/HYSTEROSCOPY WITH RESECTOCOPE AND RESECTION OF ENDOMETRIAL;   Surgeon: Selinda Orion, MD;  Location: [facility];  Service: Gynecology;  Laterality: N/A;   HARDWARE REMOVAL N/A 06/05/2022   Procedure: Removal of Posterior Cervical Hardware from Occiput to Cervical Five;  Surgeon: Judith Part, MD;  Location: [place];  Service: Neurosurgery;  Laterality: N/A;   IR IMAGING GUIDED PORT INSERTION  02/01/2022   KNEE ARTHROSCOPY     left    POLYPECTOMY     POSTERIOR CERVICAL FUSION/FORAMINOTOMY N/A 08/25/2021   Procedure: Occiput to Cervical 5 Posterior cervical instrumented fusion with open biopsy of Cervical 2 Mass. Cervical Two Ganglionectomy;  Surgeon: Judith Part, MD;  Location: [place];  Service: Neurosurgery;  Laterality: N/A;   ROBOT ASSISTED PYELOPLASTY Left 11/22/2018   Procedure: XI ROBOTIC ASSISTED ATTEMPTED PYELOPLASTY CONVERTED TO NEPHRECTOMY/LYSIS OF ADHESIONS/UMBILICAL HERNIA REPAIR;  Surgeon: Ardis Hughs, MD;  Location: [facility];  Service: Urology;  Laterality: Left;   ROBOTIC ASSITED PARTIAL NEPHRECTOMY Left 06/11/2015   Procedure: LEFT ROBOTIC ASSISTED LAPAROSCOPIC  PARTIAL NEPHRECTOMY;  Surgeon: Ardis Hughs, MD;  Location: [facility];  Service: Urology;  Laterality: Left;   ROTATOR CUFF REPAIR Left 2011   URETERAL BIOPSY Left 02/14/2017   Procedure: URETERAL BIOPSY;  Surgeon: Ardis Hughs, MD;  Location: [facility];  Service: Urology;  Laterality: Left;   URETEROSCOPY WITH HOLMIUM LASER LITHOTRIPSY Left 02/14/2017   Procedure: URETEROSCOPY LITHOTRIPSY, URETERAL BALLOON DILATION;  Surgeon: Ardis Hughs, MD;  Location: [facility];  Service: Urology;  Laterality: Left;  With STENT    SOCIAL HISTORY: Social History   Socioeconomic History   Marital status: Married    Spouse name: Not on file   Number of children: Not on file   Years of education: Not on file   Highest education level: Not on file  Occupational History   Not on file  Tobacco Use   Smoking status: Never   Smokeless tobacco: Never   Vaping Use   Vaping Use: Never used  Substance and Sexual Activity   Alcohol use: Yes    Comment: occ   Drug use: No   Sexual activity: Not on file  Other Topics Concern   Not on file  Social History Narrative   Not on file   Social Determinants of Health   Financial Resource Strain: Low Risk  (11/22/2018)   Overall Financial Resource Strain (CARDIA)    Difficulty of Paying Living Expenses: Not hard at all  Food Insecurity: Unknown (11/22/2018)   Hunger Vital Sign    Worried About [place] in the Last Year: Patient refused    [place] in the Last Year: Patient refused  Transportation Needs: Unknown (11/22/2018)   PRAPARE - Transportation    Lack of Transportation (Medical): Patient refused    Lack of Transportation (Non-Medical): Patient refused  Physical Activity: Unknown (11/22/2018)   Exercise Vital Sign    Days of Exercise per Week: Patient refused    Minutes of Exercise  per Session: Patient refused  Stress: No Stress Concern Present (11/22/2018)   [place]    Feeling of Stress : Not at all  Social Connections: Unknown (11/22/2018)   Social Connection and Isolation Panel [NHANES]    Frequency of Communication with Friends and Family: Patient refused    Frequency of Social Gatherings with Friends and Family: Patient refused    Attends Religious Services: Patient refused    Marine scientist or Organizations: Patient refused    Attends Archivist Meetings: Patient refused    Marital Status: Patient refused  Intimate Partner Violence: Unknown (11/22/2018)   Humiliation, Afraid, Rape, and Kick questionnaire    Fear of Current or Ex-Partner: Patient refused    Emotionally Abused: Patient refused    Physically Abused: Patient refused    Sexually Abused: Patient refused    FAMILY HISTORY: Family History  Problem Relation Age of Onset   Cancer Father     ALLERGIES:  is allergic  to morphine.  MEDICATIONS:  Current Outpatient Medications  Medication Sig Dispense Refill   Ascorbic Acid (VITAMIN C ADULT GUMMIES PO) Take 1 tablet by mouth daily.     atorvastatin (LIPITOR) 20 MG tablet Take 20 mg by mouth at bedtime.     Calcium Carbonate-Vitamin D 600-5 MG-MCG CAPS Take 1 capsule by mouth every morning.     Cholecalciferol (VITAMIN D3 PO) Take by mouth.     [employer] ASPIRIN LOW DOSE 81 MG tablet TAKE 1 TABLET (81 MG TOTAL) BY MOUTH DAILY. SWALLOW WHOLE. 90 tablet 3   Cyanocobalamin (VITAMIN B12) 1000 MCG TBCR Take 1 tablet by mouth every morning.     furosemide (LASIX) 20 MG tablet Take 20 mg by mouth daily.     KLOR-CON M20 20 MEQ tablet TAKE 1 TABLET BY MOUTH EVERY DAY 30 tablet 1   lenalidomide (REVLIMID) 10 MG capsule TAKE 1 CAPSULE BY MOUTH 1 TIME A DAY for 21 days [employer] Auth # [id number] Date obtained 12/07/2022 21 capsule 0   levothyroxine (SYNTHROID) 50 MCG tablet Take 50 mcg by mouth daily before breakfast.     lidocaine-prilocaine (EMLA) cream Apply 1 application. topically as needed. 30 g 0   Magnesium Hydroxide (MILK OF MAGNESIA PO) Take 15 mLs by mouth daily as needed (constipation/diarrhea).     ondansetron (ZOFRAN) 8 MG tablet Take 1 tablet (8 mg total) by mouth every 8 (eight) hours as needed. 30 tablet 0   oxyCODONE-acetaminophen (PERCOCET) 5-325 MG tablet Take 1 tablet by mouth every 4 (four) hours as needed (pain). 30 tablet 0   OZEMPIC, 0.25 OR 0.5 MG/DOSE, 2 MG/1.5ML SOPN Inject 0.5 mg into the skin every Saturday.     prednisoLONE acetate (PRED FORTE) 1 % ophthalmic suspension Place 1 drop into the left eye 4 (four) times daily.     PROAIR HFA 108 (90 BASE) MCG/ACT inhaler Inhale 2 puffs into the lungs every 4 (four) hours as needed for wheezing or shortness of breath. Uses seasonal  3   prochlorperazine (COMPAZINE) 10 MG tablet Take 1 tablet (10 mg total) by mouth every 6 (six) hours as needed for nausea or vomiting. 30 tablet 0   PROLENSA 0.07 % SOLN  Place 1 drop into the right eye at bedtime.     Sennosides (SENOKOT PO) Take 1 tablet by mouth daily as needed (constipation). Bid     Vitamin D, Ergocalciferol, (DRISDOL) 1.25 MG (50000 UNIT) CAPS capsule Take 50,000 Units  by mouth once a week.     No current facility-administered medications for this visit.    REVIEW OF SYSTEMS:   Constitutional: ( - ) fevers, ( - )  chills , ( - ) night sweats Eyes: ( - ) blurriness of vision, ( - ) double vision, ( - ) watery eyes Ears, nose, mouth, throat, and face: ( - ) mucositis, ( - ) sore throat Respiratory: ( - ) cough, ( - ) dyspnea, ( - ) wheezes Cardiovascular: ( - ) palpitation, ( - ) chest discomfort, ( - ) lower extremity swelling Gastrointestinal:  ( - ) nausea, ( - ) heartburn, ( -) change in bowel habits Skin: ( - ) abnormal skin rashes Lymphatics: ( - ) new lymphadenopathy, ( - ) easy bruising Neurological: ( - ) numbness, ( - ) tingling, ( - ) new weaknesses Behavioral/Psych: ( - ) mood change, ( - ) new changes  All other systems were reviewed with the patient and are negative.  PHYSICAL EXAMINATION: ECOG PERFORMANCE STATUS: 1 - Symptomatic but completely ambulatory  Vitals:   12/08/22 1501  BP: 122/71  Pulse: 74  Resp: 16  Temp: 98.2 F (36.8 C)  SpO2: 100%      Filed Weights   12/08/22 1501  Weight: 173 lb 3.2 oz (78.6 kg)     GENERAL: Well-appearing middle-age Caucasian female alert, no distress and comfortable SKIN: No evidence of rash, erythema, dry skin, or raised lesions. EYES: conjunctiva are pink and non-injected, sclera clear LUNGS: clear to auscultation and percussion with normal breathing effort HEART: regular rate & rhythm and no murmurs and no lower extremity edema Musculoskeletal: no cyanosis of digits and no clubbing  PSYCH: alert & oriented x 3, fluent speech NEURO: no focal motor/sensory deficits  LABORATORY DATA:  I have reviewed the data as listed    Latest Ref Rng & Units 12/08/2022     2:42 PM 10/09/2022    8:10 AM 09/04/2022   11:18 AM  CBC  WBC 4.0 - 10.5 K/uL 2.8  2.6  2.4   Hemoglobin 12.0 - 15.0 g/dL 12.1  12.2  12.4   Hematocrit 36.0 - 46.0 % 34.5  34.9  35.6   Platelets 150 - 400 K/uL 106  141  168        Latest Ref Rng & Units 12/08/2022    2:42 PM 10/09/2022    8:10 AM 09/04/2022   11:18 AM  CMP  Glucose 70 - 99 mg/dL 128  125  120   BUN 8 - 23 mg/dL '18  15  18   '$ Creatinine 0.44 - 1.00 mg/dL 1.10  0.83  0.93   Sodium 135 - 145 mmol/L 138  142  140   Potassium 3.5 - 5.1 mmol/L 3.4  3.7  3.5   Chloride 98 - 111 mmol/L 104  109  106   CO2 22 - 32 mmol/L '27  25  30   '$ Calcium 8.9 - 10.3 mg/dL 9.2  8.7  9.0   Total Protein 6.5 - 8.1 g/dL 6.0  6.0  6.3   Total Bilirubin 0.3 - 1.2 mg/dL 1.0  0.8  0.9   Alkaline Phos 38 - 126 U/L 70  59  67   AST 15 - 41 U/L '12  12  13   '$ ALT 0 - 44 U/L '9  10  9     '$ Lab Results  Component Value Date   MPROTEIN Not Observed 10/09/2022   MPROTEIN Not  Observed 09/04/2022   MPROTEIN Not Observed 08/04/2022   Lab Results  Component Value Date   KPAFRELGTCHN 9.2 09/04/2022   KPAFRELGTCHN 3.6 07/20/2022   KPAFRELGTCHN 5.2 06/16/2022   LAMBDASER 6.2 09/04/2022   LAMBDASER 3.1 (L) 07/20/2022   LAMBDASER 3.0 (L) 06/16/2022   KAPLAMBRATIO 1.48 09/04/2022   KAPLAMBRATIO 1.16 07/20/2022   KAPLAMBRATIO 1.73 (H) 06/16/2022    RADIOGRAPHIC STUDIES: No results found.  ASSESSMENT & PLAN Carol [name] 67 y.o. female with medical history significant for multiple myeloma with plasmacytoma who presents for a follow up visit.   # Free Kappa Multiple Myeloma # Plasmacytoma  -- original finding of L2 plasmacytoma on biopsy of back lesion, bone marrow biopsy confirms greater than 60% plasma cells consistent with multiple myeloma. --Cycle 1 Day 1 of VRD treatment started on 10/26/2021.  --VRD therapy stopped on 11/25/2021 due to rash. Started Dara/Rev/Dex on 12/16/2021.  --will order monthly SPEP, UPEP, SFLC and beta 2  microglobulin --weekly CBC, CMP, and LDH --Cycle 1 Day 1 of Kd started on 01/06/2022 Plan:  --Labs show white blood cell count 2.8, hemoglobin 12.1, MCV 82.9, and platelets of 106 --Patient is currently on maintenance Revlimid 10 mg daily 21 to 28-day cycle. --Continue monthly protein labs with SPEP and serum free light chains. --RTC in 4 weeks for monitoring on therapy.   #Leukopenia/Thrombocytopenia/Anemia --Likely secondary to chemotherapy --continue to monitor.   #Thrush, resolved: --Treated with Nystatin mouthwash with improvement.   #Metallic taste/weight loss-improving:  --Encouraged to increase caloric intake and supplement with protein shakes.  #Supportive Care -- chemotherapy education complete  -- port placement complete. -- sent prescription for EMLA cream -- zofran '8mg'$  q8H PRN and compazine '10mg'$  PO q6H for nausea -- Started zometa on 02/03/2022. Will continue q 3 months.HOLD zometa in June 2023 due to upcoming spine surgery. Last dose on 07/28/2022, next due in Dec 2023.  -- no pain medication required at this time.   No orders of the defined types were placed in this encounter.  All questions were answered. The patient knows to call the clinic with any problems, questions or concerns.  I have spent a total of 30 minutes minutes of face-to-face and non-face-to-face time, preparing to see the patient, performing a medically appropriate examination, counseling and educating the patient, ordering medications/tests, documenting clinical information in the electronic health record, and care coordination.   Ledell Peoples, MD Department of Hematology/Oncology [place] at [facility] Phone: [phone] Pager: [phone] Email: [email or web address]    12/10/2022 5:01 PM

## 2022-12-09 LAB — BETA 2 MICROGLOBULIN, SERUM: Beta-2 Microglobulin: 2.9 mg/L — ABNORMAL HIGH (ref 0.6–2.4)

## 2022-12-10 ENCOUNTER — Other Ambulatory Visit: Payer: Self-pay

## 2022-12-10 ENCOUNTER — Encounter: Payer: Self-pay | Admitting: Hematology and Oncology

## 2022-12-11 LAB — KAPPA/LAMBDA LIGHT CHAINS
Kappa free light chain: 25.1 mg/L — ABNORMAL HIGH (ref 3.3–19.4)
Kappa, lambda light chain ratio: 1.35 (ref 0.26–1.65)
Lambda free light chains: 18.6 mg/L (ref 5.7–26.3)

## 2022-12-13 LAB — MULTIPLE MYELOMA PANEL, SERUM
Albumin SerPl Elph-Mcnc: 4 g/dL (ref 2.9–4.4)
Albumin/Glob SerPl: 2.1 — ABNORMAL HIGH (ref 0.7–1.7)
Alpha 1: 0.2 g/dL (ref 0.0–0.4)
Alpha2 Glob SerPl Elph-Mcnc: 0.5 g/dL (ref 0.4–1.0)
B-Globulin SerPl Elph-Mcnc: 0.8 g/dL (ref 0.7–1.3)
Gamma Glob SerPl Elph-Mcnc: 0.5 g/dL (ref 0.4–1.8)
Globulin, Total: 2 g/dL — ABNORMAL LOW (ref 2.2–3.9)
IgA: 100 mg/dL (ref 87–352)
IgG (Immunoglobin G), Serum: 489 mg/dL — ABNORMAL LOW (ref 586–1602)
IgM (Immunoglobulin M), Srm: 23 mg/dL — ABNORMAL LOW (ref 26–217)
Total Protein ELP: 6 g/dL (ref 6.0–8.5)

## 2023-01-02 ENCOUNTER — Other Ambulatory Visit: Payer: Self-pay | Admitting: Hematology and Oncology

## 2023-01-04 ENCOUNTER — Other Ambulatory Visit: Payer: Self-pay

## 2023-01-04 MED ORDER — LENALIDOMIDE 10 MG PO CAPS: ORAL_CAPSULE | ORAL | 0 refills | Status: DC

## 2023-01-05 ENCOUNTER — Other Ambulatory Visit: Payer: Self-pay | Admitting: Hematology and Oncology

## 2023-01-05 ENCOUNTER — Inpatient Hospital Stay: Payer: BC Managed Care – PPO | Admitting: Hematology and Oncology

## 2023-01-05 ENCOUNTER — Inpatient Hospital Stay: Payer: BC Managed Care – PPO | Attending: Radiation Oncology

## 2023-01-05 ENCOUNTER — Other Ambulatory Visit: Payer: Self-pay

## 2023-01-05 VITALS — BP 130/84 | HR 76 | Temp 98.3°F | Resp 14 | Wt 172.1 lb

## 2023-01-05 DIAGNOSIS — D72819 Decreased white blood cell count, unspecified: Secondary | ICD-10-CM | POA: Diagnosis not present

## 2023-01-05 DIAGNOSIS — C9001 Multiple myeloma in remission: Secondary | ICD-10-CM | POA: Diagnosis not present

## 2023-01-05 DIAGNOSIS — Z79899 Other long term (current) drug therapy: Secondary | ICD-10-CM | POA: Insufficient documentation

## 2023-01-05 DIAGNOSIS — C9000 Multiple myeloma not having achieved remission: Secondary | ICD-10-CM

## 2023-01-05 DIAGNOSIS — Z95828 Presence of other vascular implants and grafts: Secondary | ICD-10-CM

## 2023-01-05 LAB — CBC WITH DIFFERENTIAL (CANCER CENTER ONLY)
Abs Immature Granulocytes: 0 10*3/uL (ref 0.00–0.07)
Basophils Absolute: 0 10*3/uL (ref 0.0–0.1)
Basophils Relative: 2 %
Eosinophils Absolute: 0 10*3/uL (ref 0.0–0.5)
Eosinophils Relative: 3 %
HCT: 30.6 % — ABNORMAL LOW (ref 36.0–46.0)
Hemoglobin: 11.1 g/dL — ABNORMAL LOW (ref 12.0–15.0)
Immature Granulocytes: 0 %
Lymphocytes Relative: 61 %
Lymphs Abs: 0.5 10*3/uL — ABNORMAL LOW (ref 0.7–4.0)
MCH: 30.7 pg (ref 26.0–34.0)
MCHC: 36.3 g/dL — ABNORMAL HIGH (ref 30.0–36.0)
MCV: 84.5 fL (ref 80.0–100.0)
Monocytes Absolute: 0.1 10*3/uL (ref 0.1–1.0)
Monocytes Relative: 14 %
Neutro Abs: 0.2 10*3/uL — CL (ref 1.7–7.7)
Neutrophils Relative %: 20 %
Platelet Count: 83 10*3/uL — ABNORMAL LOW (ref 150–400)
RBC: 3.62 MIL/uL — ABNORMAL LOW (ref 3.87–5.11)
RDW: 17.2 % — ABNORMAL HIGH (ref 11.5–15.5)
Smear Review: NORMAL
WBC Count: 0.9 10*3/uL — CL (ref 4.0–10.5)
nRBC: 0 % (ref 0.0–0.2)

## 2023-01-05 LAB — CMP (CANCER CENTER ONLY)
ALT: 9 U/L (ref 0–44)
AST: 11 U/L — ABNORMAL LOW (ref 15–41)
Albumin: 4.1 g/dL (ref 3.5–5.0)
Alkaline Phosphatase: 72 U/L (ref 38–126)
Anion gap: 6 (ref 5–15)
BUN: 15 mg/dL (ref 8–23)
CO2: 28 mmol/L (ref 22–32)
Calcium: 8.9 mg/dL (ref 8.9–10.3)
Chloride: 107 mmol/L (ref 98–111)
Creatinine: 0.93 mg/dL (ref 0.44–1.00)
GFR, Estimated: >60 mL/min (ref >60)
Glucose, Bld: 106 mg/dL — ABNORMAL HIGH (ref 70–99)
Potassium: 3.7 mmol/L (ref 3.5–5.1)
Sodium: 141 mmol/L (ref 135–145)
Total Bilirubin: 1.1 mg/dL (ref 0.3–1.2)
Total Protein: 6 g/dL — ABNORMAL LOW (ref 6.5–8.1)

## 2023-01-05 LAB — LACTATE DEHYDROGENASE: LDH: 166 U/L (ref 98–192)

## 2023-01-05 MED ORDER — SODIUM CHLORIDE 0.9% FLUSH
10.0000 mL | Freq: Once | INTRAVENOUS | Status: AC
  Administered 2023-01-05: 10 mL

## 2023-01-05 MED ORDER — HEPARIN SOD (PORK) LOCK FLUSH 100 UNIT/ML IV SOLN
500.0000 [IU] | Freq: Once | INTRAVENOUS | Status: AC
  Administered 2023-01-05: 500 [IU]

## 2023-01-05 NOTE — Progress Notes (Unsigned)
CRITICAL VALUE STICKER  CRITICAL VALUE: WBC  0.9  RECEIVER (on-site recipient of call): Arna Snipe RN   [place] NOTIFIED: 01/05/23 1105  MESSENGER (representative from lab): Rubye Oaks  MD NOTIFIED: 01/05/23 1110  RESPONSE:  Acknowledged information

## 2023-01-05 NOTE — Progress Notes (Unsigned)
[place] Telephone:([phone]) [phone]   Fax:([phone]  PROGRESS NOTE  Patient Care Team: [name] as PCP - General (Physician Assistant)  Hematological/Oncological History # Plasmacytoma with Multiple Myeloma  07/29/2021: CT cervical spine shows expansile lytic lesion which almost completely replaces the normal C2 vertebral body  08/24/2021: PET CT scan shows increased metabolic activity at C2 and C7  08/25/2021: biopsy of C2 lesion showed finding consistent with plasma cell neoplasm.  09/08/2021: start radiation therapy 09/12/2021: establish care with Dr. Lorenso Courier  09/28/2021: end radiation therapy 10/10/2021: Bone marrow biopsy. Results confirm plasma cell neoplasm consistent with multiple myeloma, kappa restricted.  10/26/2021: Cycle 1 Day 1 of VRD 11/18/2021: Cycle 2 Day 1 of VRD 11/25/2021: Velcade HELD due to full body rash. Resolved with steroid therapy.  12/16/2021: Cycle 1 Day 1 of Dara/Rev/Dex 12/30/2021: HELD Daratumumab due to toxicity. Patient unable to tolerate. Requested holding the medication  01/06/2022: Cycle 1 Day 1 of Kd  02/03/2022: Cycle 2 Day 1 of Kd 03/03/2022: Cycle 3 Day 1 of Kd 03/31/2022: Cycle 4 Day 1 of Kd 04/28/2022: Cycle 5 Day 1 of Kd 05/26/2022: Cycle 6 Day 1 of Kd  06/30/2022: Cycle 7 Day 1 of Kd  07/20/2022: Cycle 8 Day 1 of Kd  Sept 2023: start of maintenance revlimid 10 mg PO daily 21 of 28 day cycles.   Interval History:  Carol [name] 67 y.o. female with medical history significant for multiple myeloma with plasmacytoma who presents for a follow up visit. The patient's last visit was on 10/09/2022.  In the interim since her last visit she has had no major changes of her health.  On exam today Carol [name] reports she has an upcoming sigmoidoscopy.  This is due to an abnormal finding that was noted during colonoscopy previously.  She notes that over the Christmas holiday everyone in her family got COVID including her, she reports  that she had only mild symptoms though everyone else had bad sore throat, tiredness, and runny nose.  She attributes this to her vaccination.  She reports that over Thanksgiving she did have a sinus infection was on 5 weeks of antibiotics.  Otherwise with a "quite Christmas".  She notes that she is taking the Revlimid and tolerating it well with no major side effects.  She reports that she is feeling good and her energy is "okay".  She is still recovering from her COVID and sinus infections.  She reports her sigmoidoscopy is coming up on 12/21/2022.  It will be done at [facility].  She otherwise reports no major changes in her health.  She denies fevers, chills, night sweats, shortness of breath, chest pain or cough. She has no other complaints. Rest of the 10 point ROS is below.  MEDICAL HISTORY:  Past Medical History:  Diagnosis Date   Allergy    Anemia    Ankle edema    both ankles   Arthritis    hands,knees,elbows   Cancer ([place]) 2016   tumor kidney   Diabetes mellitus    type 2    Eczema    History of kidney stones    Hypothyroidism    Obesity    Sleep apnea    cpap machine setting of 3   Thyroid disease     SURGICAL HISTORY: Past Surgical History:  Procedure Laterality Date   BREAST BIOPSY Right 2001   CHOLECYSTECTOMY  2009   open   COLONOSCOPY     CYSTOSCOPY W/ RETROGRADES  Left 11/22/2018   Procedure: CYSTOSCOPY WITH RETROGRADE PYELOGRAM;  Surgeon: Ardis Hughs, MD;  Location: [facility];  Service: Urology;  Laterality: Left;   CYSTOSCOPY WITH RETROGRADE PYELOGRAM, URETEROSCOPY AND STENT PLACEMENT Right 10/03/2021   Procedure: CYSTOSCOPY WITH RETROGRADE PYELOGRAM, URETEROSCOPY,STONE EXTRACTION AND STENT PLACEMENT;  Surgeon: Ceasar Mons, MD;  Location: [facility];  Service: Urology;  Laterality: Right;   DILATATION & CURRETTAGE/HYSTEROSCOPY WITH RESECTOCOPE N/A 01/21/2013   Procedure: DILATATION & CURETTAGE/HYSTEROSCOPY WITH RESECTOCOPE AND RESECTION OF ENDOMETRIAL;   Surgeon: Selinda Orion, MD;  Location: [facility];  Service: Gynecology;  Laterality: N/A;   HARDWARE REMOVAL N/A 06/05/2022   Procedure: Removal of Posterior Cervical Hardware from Occiput to Cervical Five;  Surgeon: Judith Part, MD;  Location: [place];  Service: Neurosurgery;  Laterality: N/A;   IR IMAGING GUIDED PORT INSERTION  02/01/2022   KNEE ARTHROSCOPY     left    POLYPECTOMY     POSTERIOR CERVICAL FUSION/FORAMINOTOMY N/A 08/25/2021   Procedure: Occiput to Cervical 5 Posterior cervical instrumented fusion with open biopsy of Cervical 2 Mass. Cervical Two Ganglionectomy;  Surgeon: Judith Part, MD;  Location: [place];  Service: Neurosurgery;  Laterality: N/A;   ROBOT ASSISTED PYELOPLASTY Left 11/22/2018   Procedure: XI ROBOTIC ASSISTED ATTEMPTED PYELOPLASTY CONVERTED TO NEPHRECTOMY/LYSIS OF ADHESIONS/UMBILICAL HERNIA REPAIR;  Surgeon: Ardis Hughs, MD;  Location: [facility];  Service: Urology;  Laterality: Left;   ROBOTIC ASSITED PARTIAL NEPHRECTOMY Left 06/11/2015   Procedure: LEFT ROBOTIC ASSISTED LAPAROSCOPIC  PARTIAL NEPHRECTOMY;  Surgeon: Ardis Hughs, MD;  Location: [facility];  Service: Urology;  Laterality: Left;   ROTATOR CUFF REPAIR Left 2011   URETERAL BIOPSY Left 02/14/2017   Procedure: URETERAL BIOPSY;  Surgeon: Ardis Hughs, MD;  Location: [facility];  Service: Urology;  Laterality: Left;   URETEROSCOPY WITH HOLMIUM LASER LITHOTRIPSY Left 02/14/2017   Procedure: URETEROSCOPY LITHOTRIPSY, URETERAL BALLOON DILATION;  Surgeon: Ardis Hughs, MD;  Location: [facility];  Service: Urology;  Laterality: Left;  With STENT    SOCIAL HISTORY: Social History   Socioeconomic History   Marital status: Married    Spouse name: Not on file   Number of children: Not on file   Years of education: Not on file   Highest education level: Not on file  Occupational History   Not on file  Tobacco Use   Smoking status: Never   Smokeless tobacco: Never   Vaping Use   Vaping Use: Never used  Substance and Sexual Activity   Alcohol use: Yes    Comment: occ   Drug use: No   Sexual activity: Not on file  Other Topics Concern   Not on file  Social History Narrative   Not on file   Social Determinants of Health   Financial Resource Strain: Low Risk  (11/22/2018)   Overall Financial Resource Strain (CARDIA)    Difficulty of Paying Living Expenses: Not hard at all  Food Insecurity: Unknown (11/22/2018)   Hunger Vital Sign    Worried About [place] in the Last Year: Patient refused    [place] in the Last Year: Patient refused  Transportation Needs: Unknown (11/22/2018)   PRAPARE - Transportation    Lack of Transportation (Medical): Patient refused    Lack of Transportation (Non-Medical): Patient refused  Physical Activity: Unknown (11/22/2018)   Exercise Vital Sign    Days of Exercise per Week: Patient refused    Minutes of Exercise  per Session: Patient refused  Stress: No Stress Concern Present (11/22/2018)   [place]    Feeling of Stress : Not at all  Social Connections: Unknown (11/22/2018)   Social Connection and Isolation Panel [NHANES]    Frequency of Communication with Friends and Family: Patient refused    Frequency of Social Gatherings with Friends and Family: Patient refused    Attends Religious Services: Patient refused    Marine scientist or Organizations: Patient refused    Attends Archivist Meetings: Patient refused    Marital Status: Patient refused  Intimate Partner Violence: Unknown (11/22/2018)   Humiliation, Afraid, Rape, and Kick questionnaire    Fear of Current or Ex-Partner: Patient refused    Emotionally Abused: Patient refused    Physically Abused: Patient refused    Sexually Abused: Patient refused    FAMILY HISTORY: Family History  Problem Relation Age of Onset   Cancer Father     ALLERGIES:  is allergic  to morphine.  MEDICATIONS:  Current Outpatient Medications  Medication Sig Dispense Refill   Ascorbic Acid (VITAMIN C ADULT GUMMIES PO) Take 1 tablet by mouth daily.     atorvastatin (LIPITOR) 20 MG tablet Take 20 mg by mouth at bedtime.     Calcium Carbonate-Vitamin D 600-5 MG-MCG CAPS Take 1 capsule by mouth every morning.     Cholecalciferol (VITAMIN D3 PO) Take by mouth.     [employer] ASPIRIN LOW DOSE 81 MG tablet TAKE 1 TABLET (81 MG TOTAL) BY MOUTH DAILY. SWALLOW WHOLE. 90 tablet 3   Cyanocobalamin (VITAMIN B12) 1000 MCG TBCR Take 1 tablet by mouth every morning.     furosemide (LASIX) 20 MG tablet Take 20 mg by mouth daily.     KLOR-CON M20 20 MEQ tablet TAKE 1 TABLET BY MOUTH EVERY DAY 30 tablet 1   lenalidomide (REVLIMID) 10 MG capsule TAKE 1 CAPSULE BY MOUTH 1 TIME A DAY for 21 days [employer] Auth # [id number] Date obtained 01/04/2023 21 capsule 0   levothyroxine (SYNTHROID) 50 MCG tablet Take 50 mcg by mouth daily before breakfast.     lidocaine-prilocaine (EMLA) cream Apply 1 application. topically as needed. 30 g 0   Magnesium Hydroxide (MILK OF MAGNESIA PO) Take 15 mLs by mouth daily as needed (constipation/diarrhea).     ondansetron (ZOFRAN) 8 MG tablet Take 1 tablet (8 mg total) by mouth every 8 (eight) hours as needed. 30 tablet 0   oxyCODONE-acetaminophen (PERCOCET) 5-325 MG tablet Take 1 tablet by mouth every 4 (four) hours as needed (pain). 30 tablet 0   OZEMPIC, 0.25 OR 0.5 MG/DOSE, 2 MG/1.5ML SOPN Inject 0.5 mg into the skin every Saturday.     prednisoLONE acetate (PRED FORTE) 1 % ophthalmic suspension Place 1 drop into the left eye 4 (four) times daily.     PROAIR HFA 108 (90 BASE) MCG/ACT inhaler Inhale 2 puffs into the lungs every 4 (four) hours as needed for wheezing or shortness of breath. Uses seasonal  3   prochlorperazine (COMPAZINE) 10 MG tablet Take 1 tablet (10 mg total) by mouth every 6 (six) hours as needed for nausea or vomiting. 30 tablet 0   PROLENSA 0.07 % SOLN  Place 1 drop into the right eye at bedtime.     Sennosides (SENOKOT PO) Take 1 tablet by mouth daily as needed (constipation). Bid     Vitamin D, Ergocalciferol, (DRISDOL) 1.25 MG (50000 UNIT) CAPS capsule Take 50,000 Units  by mouth once a week.     No current facility-administered medications for this visit.    REVIEW OF SYSTEMS:   Constitutional: ( - ) fevers, ( - )  chills , ( - ) night sweats Eyes: ( - ) blurriness of vision, ( - ) double vision, ( - ) watery eyes Ears, nose, mouth, throat, and face: ( - ) mucositis, ( - ) sore throat Respiratory: ( - ) cough, ( - ) dyspnea, ( - ) wheezes Cardiovascular: ( - ) palpitation, ( - ) chest discomfort, ( - ) lower extremity swelling Gastrointestinal:  ( - ) nausea, ( - ) heartburn, ( -) change in bowel habits Skin: ( - ) abnormal skin rashes Lymphatics: ( - ) new lymphadenopathy, ( - ) easy bruising Neurological: ( - ) numbness, ( - ) tingling, ( - ) new weaknesses Behavioral/Psych: ( - ) mood change, ( - ) new changes  All other systems were reviewed with the patient and are negative.  PHYSICAL EXAMINATION: ECOG PERFORMANCE STATUS: 1 - Symptomatic but completely ambulatory  Vitals:   01/05/23 1039  BP: 130/84  Pulse: 76  Resp: 14  Temp: 98.3 F (36.8 C)  SpO2: 100%      Filed Weights   01/05/23 1039  Weight: 172 lb 1.6 oz (78.1 kg)     GENERAL: Well-appearing middle-age Caucasian female alert, no distress and comfortable SKIN: No evidence of rash, erythema, dry skin, or raised lesions. EYES: conjunctiva are pink and non-injected, sclera clear LUNGS: clear to auscultation and percussion with normal breathing effort HEART: regular rate & rhythm and no murmurs and no lower extremity edema Musculoskeletal: no cyanosis of digits and no clubbing  PSYCH: alert & oriented x 3, fluent speech NEURO: no focal motor/sensory deficits  LABORATORY DATA:  I have reviewed the data as listed    Latest Ref Rng & Units 01/05/2023    10:01 AM 12/08/2022    2:42 PM 10/09/2022    8:10 AM  CBC  WBC 4.0 - 10.5 K/uL 0.9  2.8  2.6   Hemoglobin 12.0 - 15.0 g/dL 11.1  12.1  12.2   Hematocrit 36.0 - 46.0 % 30.6  34.5  34.9   Platelets 150 - 400 K/uL 83  106  141        Latest Ref Rng & Units 01/05/2023   10:01 AM 12/08/2022    2:42 PM 10/09/2022    8:10 AM  CMP  Glucose 70 - 99 mg/dL 106  128  125   BUN 8 - 23 mg/dL 15  18  15   $ Creatinine 0.44 - 1.00 mg/dL 0.93  1.10  0.83   Sodium 135 - 145 mmol/L 141  138  142   Potassium 3.5 - 5.1 mmol/L 3.7  3.4  3.7   Chloride 98 - 111 mmol/L 107  104  109   CO2 22 - 32 mmol/L 28  27  25   $ Calcium 8.9 - 10.3 mg/dL 8.9  9.2  8.7   Total Protein 6.5 - 8.1 g/dL 6.0  6.0  6.0   Total Bilirubin 0.3 - 1.2 mg/dL 1.1  1.0  0.8   Alkaline Phos 38 - 126 U/L 72  70  59   AST 15 - 41 U/L 11  12  12   $ ALT 0 - 44 U/L 9  9  10     $ Lab Results  Component Value Date   MPROTEIN Not Observed 12/08/2022   MPROTEIN Not  Observed 10/09/2022   MPROTEIN Not Observed 09/04/2022   Lab Results  Component Value Date   KPAFRELGTCHN 25.1 (H) 12/08/2022   KPAFRELGTCHN 9.2 09/04/2022   KPAFRELGTCHN 3.6 07/20/2022   LAMBDASER 18.6 12/08/2022   LAMBDASER 6.2 09/04/2022   LAMBDASER 3.1 (L) 07/20/2022   KAPLAMBRATIO 1.35 12/08/2022   KAPLAMBRATIO 1.48 09/04/2022   KAPLAMBRATIO 1.16 07/20/2022    RADIOGRAPHIC STUDIES: No results found.  ASSESSMENT & PLAN ANGI RAMEL 67 y.o. female with medical history significant for multiple myeloma with plasmacytoma who presents for a follow up visit.   # Free Kappa Multiple Myeloma # Plasmacytoma  -- original finding of L2 plasmacytoma on biopsy of back lesion, bone marrow biopsy confirms greater than 60% plasma cells consistent with multiple myeloma. --Cycle 1 Day 1 of VRD treatment started on 10/26/2021.  --VRD therapy stopped on 11/25/2021 due to rash. Started Dara/Rev/Dex on 12/16/2021.  --will order monthly SPEP, UPEP, SFLC and beta 2  microglobulin --weekly CBC, CMP, and LDH --Cycle 1 Day 1 of Kd started on 01/06/2022 Plan:  --Labs show white blood cell count *** --Patient is currently on maintenance Revlimid 10 mg daily 21 to 28-day cycle. --Continue monthly protein labs with SPEP and serum free light chains. --RTC in 4 weeks for monitoring on therapy.   #Leukopenia/Thrombocytopenia/Anemia --Likely secondary to chemotherapy --continue to monitor.   #Thrush, resolved: --Treated with Nystatin mouthwash with improvement.   #Metallic taste/weight loss-improving:  --Encouraged to increase caloric intake and supplement with protein shakes.  #Supportive Care -- chemotherapy education complete  -- port placement complete. -- sent prescription for EMLA cream -- zofran 26m q8H PRN and compazine 159mPO q6H for nausea -- Started zometa on 02/03/2022. Will continue q 3 months.HOLD zometa in June 2023 due to upcoming spine surgery. Last dose on 07/28/2022, next due in Dec 2023.  -- no pain medication required at this time.   No orders of the defined types were placed in this encounter.  All questions were answered. The patient knows to call the clinic with any problems, questions or concerns.  I have spent a total of 30 minutes minutes of face-to-face and non-face-to-face time, preparing to see the patient, performing a medically appropriate examination, counseling and educating the patient, ordering medications/tests, documenting clinical information in the electronic health record, and care coordination.   JoLedell PeoplesMD Department of Hematology/Oncology CoStovallt WeK[facility]le[facility] Medical Centerhone: [phone]: [phone]: [email or web address]  01/05/2023 10:50 AM

## 2023-01-08 LAB — KAPPA/LAMBDA LIGHT CHAINS
Kappa free light chain: 24.2 mg/L — ABNORMAL HIGH (ref 3.3–19.4)
Kappa, lambda light chain ratio: 1.12 (ref 0.26–1.65)
Lambda free light chains: 21.7 mg/L (ref 5.7–26.3)

## 2023-01-09 ENCOUNTER — Encounter: Payer: Self-pay | Admitting: Hematology and Oncology

## 2023-01-12 ENCOUNTER — Inpatient Hospital Stay: Payer: BC Managed Care – PPO

## 2023-01-12 ENCOUNTER — Other Ambulatory Visit: Payer: Self-pay

## 2023-01-12 DIAGNOSIS — Z95828 Presence of other vascular implants and grafts: Secondary | ICD-10-CM

## 2023-01-12 DIAGNOSIS — C9000 Multiple myeloma not having achieved remission: Secondary | ICD-10-CM

## 2023-01-12 LAB — CBC WITH DIFFERENTIAL/PLATELET
Abs Immature Granulocytes: 0 10*3/uL (ref 0.00–0.07)
Basophils Absolute: 0 10*3/uL (ref 0.0–0.1)
Basophils Relative: 3 %
Eosinophils Absolute: 0 10*3/uL (ref 0.0–0.5)
Eosinophils Relative: 2 %
HCT: 31.2 % — ABNORMAL LOW (ref 36.0–46.0)
Hemoglobin: 11.2 g/dL — ABNORMAL LOW (ref 12.0–15.0)
Immature Granulocytes: 0 %
Lymphocytes Relative: 59 %
Lymphs Abs: 0.8 10*3/uL (ref 0.7–4.0)
MCH: 30.9 pg (ref 26.0–34.0)
MCHC: 35.9 g/dL (ref 30.0–36.0)
MCV: 86 fL (ref 80.0–100.0)
Monocytes Absolute: 0.2 10*3/uL (ref 0.1–1.0)
Monocytes Relative: 13 %
Neutro Abs: 0.3 10*3/uL — CL (ref 1.7–7.7)
Neutrophils Relative %: 23 %
Platelets: 107 10*3/uL — ABNORMAL LOW (ref 150–400)
RBC: 3.63 MIL/uL — ABNORMAL LOW (ref 3.87–5.11)
RDW: 17.7 % — ABNORMAL HIGH (ref 11.5–15.5)
WBC: 1.3 10*3/uL — ABNORMAL LOW (ref 4.0–10.5)
nRBC: 0 % (ref 0.0–0.2)

## 2023-01-12 LAB — COMPREHENSIVE METABOLIC PANEL
ALT: 7 U/L (ref 0–44)
AST: 11 U/L — ABNORMAL LOW (ref 15–41)
Albumin: 4 g/dL (ref 3.5–5.0)
Alkaline Phosphatase: 75 U/L (ref 38–126)
Anion gap: 7 (ref 5–15)
BUN: 15 mg/dL (ref 8–23)
CO2: 27 mmol/L (ref 22–32)
Calcium: 8.4 mg/dL — ABNORMAL LOW (ref 8.9–10.3)
Chloride: 109 mmol/L (ref 98–111)
Creatinine, Ser: 0.84 mg/dL (ref 0.44–1.00)
GFR, Estimated: >60 mL/min (ref >60)
Glucose, Bld: 100 mg/dL — ABNORMAL HIGH (ref 70–99)
Potassium: 3.6 mmol/L (ref 3.5–5.1)
Sodium: 143 mmol/L (ref 135–145)
Total Bilirubin: 0.8 mg/dL (ref 0.3–1.2)
Total Protein: 6.1 g/dL — ABNORMAL LOW (ref 6.5–8.1)

## 2023-01-12 LAB — MULTIPLE MYELOMA PANEL, SERUM
Albumin SerPl Elph-Mcnc: 3.9 g/dL (ref 2.9–4.4)
Albumin/Glob SerPl: 2.1 — ABNORMAL HIGH (ref 0.7–1.7)
Alpha 1: 0.2 g/dL (ref 0.0–0.4)
Alpha2 Glob SerPl Elph-Mcnc: 0.5 g/dL (ref 0.4–1.0)
B-Globulin SerPl Elph-Mcnc: 0.8 g/dL (ref 0.7–1.3)
Gamma Glob SerPl Elph-Mcnc: 0.4 g/dL (ref 0.4–1.8)
Globulin, Total: 1.9 g/dL — ABNORMAL LOW (ref 2.2–3.9)
IgA: 90 mg/dL (ref 87–352)
IgG (Immunoglobin G), Serum: 497 mg/dL — ABNORMAL LOW (ref 586–1602)
IgM (Immunoglobulin M), Srm: 19 mg/dL — ABNORMAL LOW (ref 26–217)
Total Protein ELP: 5.8 g/dL — ABNORMAL LOW (ref 6.0–8.5)

## 2023-01-12 MED ORDER — SODIUM CHLORIDE 0.9% FLUSH
10.0000 mL | Freq: Once | INTRAVENOUS | Status: AC
  Administered 2023-01-12: 10 mL

## 2023-01-12 MED ORDER — HEPARIN SOD (PORK) LOCK FLUSH 100 UNIT/ML IV SOLN
500.0000 [IU] | Freq: Once | INTRAVENOUS | Status: AC
  Administered 2023-01-12: 500 [IU]

## 2023-01-12 NOTE — Progress Notes (Signed)
CRITICAL VALUE STICKER  CRITICAL VALUE: ANC 0.3  RECEIVER (on-site recipient of call):Livingston Denner RN   DATE & TIME NOTIFIED: 01/12/2023  KN:[id number]  MESSENGER (representative from lab):Pam  MD NOTIFIED: Dr. Lorenso Courier   TIME OF NOTIFICATION: [phone]  RESPONSE:  "please call patient with ANC value and review neutropenic precautions".

## 2023-01-17 ENCOUNTER — Inpatient Hospital Stay (HOSPITAL_BASED_OUTPATIENT_CLINIC_OR_DEPARTMENT_OTHER): Payer: BC Managed Care – PPO | Admitting: Physician Assistant

## 2023-01-17 ENCOUNTER — Other Ambulatory Visit: Payer: Self-pay

## 2023-01-17 ENCOUNTER — Inpatient Hospital Stay: Payer: BC Managed Care – PPO

## 2023-01-17 VITALS — BP 111/61 | HR 67 | Temp 97.7°F | Resp 16 | Ht 62.0 in | Wt 173.6 lb

## 2023-01-17 DIAGNOSIS — C9001 Multiple myeloma in remission: Secondary | ICD-10-CM | POA: Diagnosis not present

## 2023-01-17 DIAGNOSIS — C9000 Multiple myeloma not having achieved remission: Secondary | ICD-10-CM | POA: Diagnosis not present

## 2023-01-17 DIAGNOSIS — Z95828 Presence of other vascular implants and grafts: Secondary | ICD-10-CM

## 2023-01-17 LAB — CBC WITH DIFFERENTIAL (CANCER CENTER ONLY)
Abs Immature Granulocytes: 0 10*3/uL (ref 0.00–0.07)
Basophils Absolute: 0.1 10*3/uL (ref 0.0–0.1)
Basophils Relative: 3 %
Eosinophils Absolute: 0.1 10*3/uL (ref 0.0–0.5)
Eosinophils Relative: 3 %
HCT: 32 % — ABNORMAL LOW (ref 36.0–46.0)
Hemoglobin: 11.5 g/dL — ABNORMAL LOW (ref 12.0–15.0)
Immature Granulocytes: 0 %
Lymphocytes Relative: 55 %
Lymphs Abs: 0.9 10*3/uL (ref 0.7–4.0)
MCH: 31.2 pg (ref 26.0–34.0)
MCHC: 35.9 g/dL (ref 30.0–36.0)
MCV: 86.7 fL (ref 80.0–100.0)
Monocytes Absolute: 0.2 10*3/uL (ref 0.1–1.0)
Monocytes Relative: 11 %
Neutro Abs: 0.5 10*3/uL — ABNORMAL LOW (ref 1.7–7.7)
Neutrophils Relative %: 28 %
Platelet Count: 115 10*3/uL — ABNORMAL LOW (ref 150–400)
RBC: 3.69 MIL/uL — ABNORMAL LOW (ref 3.87–5.11)
RDW: 18.8 % — ABNORMAL HIGH (ref 11.5–15.5)
WBC Count: 1.6 10*3/uL — ABNORMAL LOW (ref 4.0–10.5)
nRBC: 0 % (ref 0.0–0.2)

## 2023-01-17 LAB — CMP (CANCER CENTER ONLY)
ALT: 7 U/L (ref 0–44)
AST: 11 U/L — ABNORMAL LOW (ref 15–41)
Albumin: 4.1 g/dL (ref 3.5–5.0)
Alkaline Phosphatase: 67 U/L (ref 38–126)
Anion gap: 5 (ref 5–15)
BUN: 17 mg/dL (ref 8–23)
CO2: 27 mmol/L (ref 22–32)
Calcium: 8.7 mg/dL — ABNORMAL LOW (ref 8.9–10.3)
Chloride: 109 mmol/L (ref 98–111)
Creatinine: 0.87 mg/dL (ref 0.44–1.00)
GFR, Estimated: >60 mL/min (ref >60)
Glucose, Bld: 94 mg/dL (ref 70–99)
Potassium: 3.6 mmol/L (ref 3.5–5.1)
Sodium: 141 mmol/L (ref 135–145)
Total Bilirubin: 0.8 mg/dL (ref 0.3–1.2)
Total Protein: 5.9 g/dL — ABNORMAL LOW (ref 6.5–8.1)

## 2023-01-17 MED ORDER — HEPARIN SOD (PORK) LOCK FLUSH 100 UNIT/ML IV SOLN
500.0000 [IU] | Freq: Once | INTRAVENOUS | Status: AC
  Administered 2023-01-17: 500 [IU]

## 2023-01-17 MED ORDER — SODIUM CHLORIDE 0.9% FLUSH
10.0000 mL | Freq: Once | INTRAVENOUS | Status: AC
  Administered 2023-01-17: 10 mL

## 2023-01-18 ENCOUNTER — Encounter: Payer: Self-pay | Admitting: Hematology and Oncology

## 2023-01-18 NOTE — Progress Notes (Signed)
[place] [phone]   [phone]  PROGRESS NOTE  Patient Care Team: [name] as PCP - General (Physician Assistant)  Hematological/Oncological History # Plasmacytoma with Multiple Myeloma  07/29/2021: CT cervical spine shows expansile lytic lesion which almost completely replaces the normal C2 vertebral body  08/24/2021: PET CT scan shows increased metabolic activity at C2 and C7  08/25/2021: biopsy of C2 lesion showed finding consistent with plasma cell neoplasm.  09/08/2021: start radiation therapy 09/12/2021: establish care with Dr. Lorenso Courier  09/28/2021: end radiation therapy 10/10/2021: Bone marrow biopsy. Results confirm plasma cell neoplasm consistent with multiple myeloma, kappa restricted.  10/26/2021: Cycle 1 Day 1 of VRD 11/18/2021: Cycle 2 Day 1 of VRD 11/25/2021: Velcade HELD due to full body rash. Resolved with steroid therapy.  12/16/2021: Cycle 1 Day 1 of Dara/Rev/Dex 12/30/2021: HELD Daratumumab due to toxicity. Patient unable to tolerate. Requested holding the medication  01/06/2022: Cycle 1 Day 1 of Kd  02/03/2022: Cycle 2 Day 1 of Kd 03/03/2022: Cycle 3 Day 1 of Kd 03/31/2022: Cycle 4 Day 1 of Kd 04/28/2022: Cycle 5 Day 1 of Kd 05/26/2022: Cycle 6 Day 1 of Kd  06/30/2022: Cycle 7 Day 1 of Kd  07/20/2022: Cycle 8 Day 1 of Kd  Sept 2023: start of maintenance revlimid 10 mg PO daily 21 of 28 day cycles.  01/05/2023: HELD Revlimid due to cytopenias.  Interval History:  Carol [name] 67 y.o. female with medical history significant for multiple myeloma with plasmacytoma who presents for a follow up visit. The patient's last visit was on 01/05/2023.  In the interim since her last visit she has held Revlimid due to cytopenias.   On exam today Mrs. Lanzi reports reports having low energy the last few weeks. She still works and is able to complete her ADLs independently. She denies any changes to her appetite or weight. She reports the left  arm discomfort resolved when she discontinued Revlimid. She denies nausea, vomiting or abdominal pain. Her bowel habits are unchanged. She denies fevers, chills, night sweats, shortness of breath, chest pain or cough. She has no other complaints. Rest of the 10 point ROS is below.  MEDICAL HISTORY:  Past Medical History:  Diagnosis Date   Allergy    Anemia    Ankle edema    both ankles   Arthritis    hands,knees,elbows   Cancer ([place]) 2016   tumor kidney   Diabetes mellitus    type 2    Eczema    History of kidney stones    Hypothyroidism    Obesity    Sleep apnea    cpap machine setting of 3   Thyroid disease     SURGICAL HISTORY: Past Surgical History:  Procedure Laterality Date   BREAST BIOPSY Right 2001   CHOLECYSTECTOMY  2009   open   COLONOSCOPY     CYSTOSCOPY W/ RETROGRADES Left 11/22/2018   Procedure: CYSTOSCOPY WITH RETROGRADE PYELOGRAM;  Surgeon: Ardis Hughs, MD;  Location: [facility];  Service: Urology;  Laterality: Left;   CYSTOSCOPY WITH RETROGRADE PYELOGRAM, URETEROSCOPY AND STENT PLACEMENT Right 10/03/2021   Procedure: CYSTOSCOPY WITH RETROGRADE PYELOGRAM, URETEROSCOPY,STONE EXTRACTION AND STENT PLACEMENT;  Surgeon: Ceasar Mons, MD;  Location: [facility];  Service: Urology;  Laterality: Right;   DILATATION & CURRETTAGE/HYSTEROSCOPY WITH RESECTOCOPE N/A 01/21/2013   Procedure: DILATATION & CURETTAGE/HYSTEROSCOPY WITH RESECTOCOPE AND RESECTION OF ENDOMETRIAL;  Surgeon: Selinda Orion, MD;  Location[facility];  Service: Gynecology;  Laterality: N/A;   HARDWARE REMOVAL N/A 06/05/2022   Procedure: Removal of Posterior Cervical Hardware from Occiput to Cervical Five;  Surgeon: Judith Part, MD;  Location: [place];  Service: Neurosurgery;  Laterality: N/A;   IR IMAGING GUIDED PORT INSERTION  02/01/2022   KNEE ARTHROSCOPY     left    POLYPECTOMY     POSTERIOR CERVICAL FUSION/FORAMINOTOMY N/A 08/25/2021   Procedure: Occiput to Cervical 5  Posterior cervical instrumented fusion with open biopsy of Cervical 2 Mass. Cervical Two Ganglionectomy;  Surgeon: Judith Part, MD;  Location: [place];  Service: Neurosurgery;  Laterality: N/A;   ROBOT ASSISTED PYELOPLASTY Left 11/22/2018   Procedure: XI ROBOTIC ASSISTED ATTEMPTED PYELOPLASTY CONVERTED TO NEPHRECTOMY/LYSIS OF ADHESIONS/UMBILICAL HERNIA REPAIR;  Surgeon: Ardis Hughs, MD;  Location: [facility];  Service: Urology;  Laterality: Left;   ROBOTIC ASSITED PARTIAL NEPHRECTOMY Left 06/11/2015   Procedure: LEFT ROBOTIC ASSISTED LAPAROSCOPIC  PARTIAL NEPHRECTOMY;  Surgeon: Ardis Hughs, MD;  Location: [facility];  Service: Urology;  Laterality: Left;   ROTATOR CUFF REPAIR Left 2011   URETERAL BIOPSY Left 02/14/2017   Procedure: URETERAL BIOPSY;  Surgeon: Ardis Hughs, MD;  Location: [facility];  Service: Urology;  Laterality: Left;   URETEROSCOPY WITH HOLMIUM LASER LITHOTRIPSY Left 02/14/2017   Procedure: URETEROSCOPY LITHOTRIPSY, URETERAL BALLOON DILATION;  Surgeon: Ardis Hughs, MD;  Location: [facility];  Service: Urology;  Laterality: Left;  With STENT    SOCIAL HISTORY: Social History   Socioeconomic History   Marital status: Married    Spouse name: Not on file   Number of children: Not on file   Years of education: Not on file   Highest education level: Not on file  Occupational History   Not on file  Tobacco Use   Smoking status: Never   Smokeless tobacco: Never  Vaping Use   Vaping Use: Never used  Substance and Sexual Activity   Alcohol use: Yes    Comment: occ   Drug use: No   Sexual activity: Not on file  Other Topics Concern   Not on file  Social History Narrative   Not on file   Social Determinants of Health   Financial Resource Strain: Low Risk  (11/22/2018)   Overall Financial Resource Strain (CARDIA)    Difficulty of Paying Living Expenses: Not hard at all  Food Insecurity: Unknown (11/22/2018)   Hunger Vital Sign    Worried About Running Out  of Food in the Last Year: Patient refused    [place] in the Last Year: Patient refused  Transportation Needs: Unknown (11/22/2018)   PRAPARE - Transportation    Lack of Transportation (Medical): Patient refused    Lack of Transportation (Non-Medical): Patient refused  Physical Activity: Unknown (11/22/2018)   Exercise Vital Sign    Days of Exercise per Week: Patient refused    Minutes of Exercise per Session: Patient refused  Stress: No Stress Concern Present (11/22/2018)   [employer] [place] of Stress : Not at all  Social Connections: Unknown (11/22/2018)   Social Connection and Isolation Panel [NHANES]    Frequency of Communication with Friends and Family: Patient refused    Frequency of Social Gatherings with Friends and Family: Patient refused    Attends Religious Services: Patient refused    Active Member of Clubs or Organizations: Patient refused    Attends Archivist Meetings: Patient refused  Marital Status: Patient refused  Intimate Partner Violence: Unknown (11/22/2018)   Humiliation, Afraid, Rape, and Kick questionnaire    Fear of Current or Ex-Partner: Patient refused    Emotionally Abused: Patient refused    Physically Abused: Patient refused    Sexually Abused: Patient refused    FAMILY HISTORY: Family History  Problem Relation Age of Onset   Cancer Father     ALLERGIES:  is allergic to morphine.  MEDICATIONS:  Current Outpatient Medications  Medication Sig Dispense Refill   Ascorbic Acid (VITAMIN C ADULT GUMMIES PO) Take 1 tablet by mouth daily.     atorvastatin (LIPITOR) 20 MG tablet Take 20 mg by mouth at bedtime.     Calcium Carbonate-Vitamin D 600-5 MG-MCG CAPS Take 1 capsule by mouth every morning.     Cholecalciferol (VITAMIN D3 PO) Take by mouth.     [employer] ASPIRIN LOW DOSE 81 MG tablet TAKE 1 TABLET (81 MG TOTAL) BY MOUTH DAILY. SWALLOW WHOLE. 90 tablet 3   Cyanocobalamin  (VITAMIN B12) 1000 MCG TBCR Take 1 tablet by mouth every morning.     furosemide (LASIX) 20 MG tablet Take 20 mg by mouth daily.     KLOR-CON M20 20 MEQ tablet TAKE 1 TABLET BY MOUTH EVERY DAY 30 tablet 1   lenalidomide (REVLIMID) 10 MG capsule TAKE 1 CAPSULE BY MOUTH 1 TIME A DAY for 21 days [employer] Auth # [id number] Date obtained 01/04/2023 21 capsule 0   levothyroxine (SYNTHROID) 50 MCG tablet Take 50 mcg by mouth daily before breakfast.     lidocaine-prilocaine (EMLA) cream Apply 1 application. topically as needed. 30 g 0   Magnesium Hydroxide (MILK OF MAGNESIA PO) Take 15 mLs by mouth daily as needed (constipation/diarrhea).     ondansetron (ZOFRAN) 8 MG tablet Take 1 tablet (8 mg total) by mouth every 8 (eight) hours as needed. 30 tablet 0   oxyCODONE-acetaminophen (PERCOCET) 5-325 MG tablet Take 1 tablet by mouth every 4 (four) hours as needed (pain). 30 tablet 0   OZEMPIC, 0.25 OR 0.5 MG/DOSE, 2 MG/1.5ML SOPN Inject 0.5 mg into the skin every Saturday.     prednisoLONE acetate (PRED FORTE) 1 % ophthalmic suspension Place 1 drop into the left eye 4 (four) times daily.     PROAIR HFA 108 (90 BASE) MCG/ACT inhaler Inhale 2 puffs into the lungs every 4 (four) hours as needed for wheezing or shortness of breath. Uses seasonal  3   prochlorperazine (COMPAZINE) 10 MG tablet Take 1 tablet (10 mg total) by mouth every 6 (six) hours as needed for nausea or vomiting. 30 tablet 0   PROLENSA 0.07 % SOLN Place 1 drop into the right eye at bedtime.     Sennosides (SENOKOT PO) Take 1 tablet by mouth daily as needed (constipation). Bid     Vitamin D, Ergocalciferol, (DRISDOL) 1.25 MG (50000 UNIT) CAPS capsule Take 50,000 Units by mouth once a week. (Patient not taking: Reported on 01/17/2023)     No current facility-administered medications for this visit.    REVIEW OF SYSTEMS:   Constitutional: ( - ) fevers, ( - )  chills , ( - ) night sweats Eyes: ( - ) blurriness of vision, ( - ) double vision, ( - )  watery eyes Ears, nose, mouth, throat, and face: ( - ) mucositis, ( - ) sore throat Respiratory: ( - ) cough, ( - ) dyspnea, ( - ) wheezes Cardiovascular: ( - ) palpitation, ( - ) chest discomfort, ( - )  lower extremity swelling Gastrointestinal:  ( - ) nausea, ( - ) heartburn, ( -) change in bowel habits Skin: ( - ) abnormal skin rashes Lymphatics: ( - ) new lymphadenopathy, ( - ) easy bruising Neurological: ( - ) numbness, ( - ) tingling, ( - ) new weaknesses Behavioral/Psych: ( - ) mood change, ( - ) new changes  All other systems were reviewed with the patient and are negative.  PHYSICAL EXAMINATION: ECOG PERFORMANCE STATUS: 1 - Symptomatic but completely ambulatory  Vitals:   01/17/23 1326  BP: 111/61  Pulse: 67  Resp: 16  Temp: 97.7 F (36.5 C)  SpO2: 100%      Filed Weights   01/17/23 1326  Weight: 173 lb 9.6 oz (78.7 kg)     GENERAL: Well-appearing middle-age Caucasian female alert, no distress and comfortable SKIN: No evidence of rash, erythema, dry skin, or raised lesions. EYES: conjunctiva are pink and non-injected, sclera clear LUNGS: clear to auscultation and percussion with normal breathing effort HEART: regular rate & rhythm and no murmurs and no lower extremity edema Musculoskeletal: no cyanosis of digits and no clubbing  PSYCH: alert & oriented x 3, fluent speech NEURO: no focal motor/sensory deficits  LABORATORY DATA:  I have reviewed the data as listed    Latest Ref Rng & Units 01/17/2023   12:56 PM 01/12/2023    8:18 AM 01/05/2023   10:01 AM  CBC  WBC 4.0 - 10.5 K/uL 1.6  1.3  0.9   Hemoglobin 12.0 - 15.0 g/dL 11.5  11.2  11.1   Hematocrit 36.0 - 46.0 % 32.0  31.2  30.6   Platelets 150 - 400 K/uL 115  107  83        Latest Ref Rng & Units 01/17/2023   12:56 PM 01/12/2023    8:18 AM 01/05/2023   10:01 AM  CMP  Glucose 70 - 99 mg/dL 94  100  106   BUN 8 - 23 mg/dL '17  15  15   '$ Creatinine 0.44 - 1.00 mg/dL 0.87  0.84  0.93   Sodium 135 - 145  mmol/L 141  143  141   Potassium 3.5 - 5.1 mmol/L 3.6  3.6  3.7   Chloride 98 - 111 mmol/L 109  109  107   CO2 22 - 32 mmol/L '27  27  28   '$ Calcium 8.9 - 10.3 mg/dL 8.7  8.4  8.9   Total Protein 6.5 - 8.1 g/dL 5.9  6.1  6.0   Total Bilirubin 0.3 - 1.2 mg/dL 0.8  0.8  1.1   Alkaline Phos 38 - 126 U/L 67  75  72   AST 15 - 41 U/L '11  11  11   '$ ALT 0 - 44 U/L '7  7  9     '$ Lab Results  Component Value Date   MPROTEIN Not Observed 01/05/2023   MPROTEIN Not Observed 12/08/2022   MPROTEIN Not Observed 10/09/2022   Lab Results  Component Value Date   KPAFRELGTCHN 24.2 (H) 01/05/2023   KPAFRELGTCHN 25.1 (H) 12/08/2022   KPAFRELGTCHN 9.2 09/04/2022   LAMBDASER 21.7 01/05/2023   LAMBDASER 18.6 12/08/2022   LAMBDASER 6.2 09/04/2022   KAPLAMBRATIO 1.12 01/05/2023   KAPLAMBRATIO 1.35 12/08/2022   KAPLAMBRATIO 1.48 09/04/2022    RADIOGRAPHIC STUDIES: No results found.  ASSESSMENT & PLAN Carol [name] is a 67 y.o. female with medical history significant for multiple myeloma with plasmacytoma who presents for a follow up visit.   #  Free Kappa Multiple Myeloma # Plasmacytoma  -- original finding of L2 plasmacytoma on biopsy of back lesion, bone marrow biopsy confirms greater than 60% plasma cells consistent with multiple myeloma. --Cycle 1 Day 1 of VRD treatment started on 10/26/2021.  --VRD therapy stopped on 11/25/2021 due to rash. Started Dara/Rev/Dex on 12/16/2021.  --will order monthly SPEP, UPEP, SFLC and beta 2 microglobulin --weekly CBC, CMP, and LDH --Cycle 1 Day 1 of Kd started on 01/06/2022 Plan:  --Labs show slow improvement of cytopenias with white blood cell count 1.6, hemoglobin 11.5, MCV 86.7, and platelets of 115 --Continue to HOLD Revlimid until recovery.  May need to consider decreasing maintenance dose to 5 mg p.o. daily instead. --Most recent myeloma las from 01/05/2023 showed no M-protein was detected. Continue monthly protein labs with SPEP and serum free light  chains. --RTC in 1 week for lab check and 2 weeks for labs/follow up.  #Left arm pain: --Resolved with holding Revlimid.   #Leukopenia/Thrombocytopenia/Anemia--improving --Likely secondary to chemotherapy --continue to monitor. Bleeding and neutropenic precautions given.  #Thrush, resolved: --Treated with Nystatin mouthwash with improvement.   #Metallic taste/weight loss-improving:  --Encouraged to increase caloric intake and supplement with protein shakes.  #Supportive Care -- chemotherapy education complete  -- port placement complete. -- sent prescription for EMLA cream -- zofran '8mg'$  q8H PRN and compazine '10mg'$  PO q6H for nausea -- Started zometa on 02/03/2022. Will continue q 3 months.HOLD zometa in June 2023 due to upcoming spine surgery. Last dose on 07/28/2022 -- no pain medication required at this time.   No orders of the defined types were placed in this encounter.  All questions were answered. The patient knows to call the clinic with any problems, questions or concerns.  I have spent a total of 30 minutes minutes of face-to-face and non-face-to-face time, preparing to see the patient, performing a medically appropriate examination, counseling and educating the patient, ordering medications/tests, documenting clinical information in the electronic health record, and care coordination.   Dede Query PA-C Dept of Hematology and [place] at [facility] Phone: [phone]   01/18/2023 6:54 AM

## 2023-01-20 ENCOUNTER — Other Ambulatory Visit: Payer: Self-pay

## 2023-01-25 ENCOUNTER — Other Ambulatory Visit: Payer: Self-pay | Admitting: *Deleted

## 2023-01-25 DIAGNOSIS — C9000 Multiple myeloma not having achieved remission: Secondary | ICD-10-CM

## 2023-01-26 ENCOUNTER — Inpatient Hospital Stay: Payer: BC Managed Care – PPO | Attending: Radiation Oncology

## 2023-01-26 DIAGNOSIS — Z95828 Presence of other vascular implants and grafts: Secondary | ICD-10-CM

## 2023-01-26 DIAGNOSIS — E039 Hypothyroidism, unspecified: Secondary | ICD-10-CM | POA: Insufficient documentation

## 2023-01-26 DIAGNOSIS — Z7961 Long term (current) use of immunomodulator: Secondary | ICD-10-CM | POA: Diagnosis not present

## 2023-01-26 DIAGNOSIS — C9000 Multiple myeloma not having achieved remission: Secondary | ICD-10-CM | POA: Diagnosis not present

## 2023-01-26 DIAGNOSIS — E119 Type 2 diabetes mellitus without complications: Secondary | ICD-10-CM | POA: Insufficient documentation

## 2023-01-26 DIAGNOSIS — Z7982 Long term (current) use of aspirin: Secondary | ICD-10-CM | POA: Diagnosis not present

## 2023-01-26 DIAGNOSIS — M79602 Pain in left arm: Secondary | ICD-10-CM | POA: Insufficient documentation

## 2023-01-26 DIAGNOSIS — Z79899 Other long term (current) drug therapy: Secondary | ICD-10-CM | POA: Insufficient documentation

## 2023-01-26 DIAGNOSIS — D72819 Decreased white blood cell count, unspecified: Secondary | ICD-10-CM | POA: Diagnosis not present

## 2023-01-26 LAB — CMP (CANCER CENTER ONLY)
ALT: 8 U/L (ref 0–44)
AST: 11 U/L — ABNORMAL LOW (ref 15–41)
Albumin: 4.2 g/dL (ref 3.5–5.0)
Alkaline Phosphatase: 73 U/L (ref 38–126)
Anion gap: 8 (ref 5–15)
BUN: 18 mg/dL (ref 8–23)
CO2: 25 mmol/L (ref 22–32)
Calcium: 9 mg/dL (ref 8.9–10.3)
Chloride: 110 mmol/L (ref 98–111)
Creatinine: 1.02 mg/dL — ABNORMAL HIGH (ref 0.44–1.00)
GFR, Estimated: >60 mL/min (ref >60)
Glucose, Bld: 147 mg/dL — ABNORMAL HIGH (ref 70–99)
Potassium: 3.9 mmol/L (ref 3.5–5.1)
Sodium: 143 mmol/L (ref 135–145)
Total Bilirubin: 0.8 mg/dL (ref 0.3–1.2)
Total Protein: 6.3 g/dL — ABNORMAL LOW (ref 6.5–8.1)

## 2023-01-26 LAB — CBC WITH DIFFERENTIAL (CANCER CENTER ONLY)
Abs Immature Granulocytes: 0.01 10*3/uL (ref 0.00–0.07)
Basophils Absolute: 0 10*3/uL (ref 0.0–0.1)
Basophils Relative: 1 %
Eosinophils Absolute: 0 10*3/uL (ref 0.0–0.5)
Eosinophils Relative: 2 %
HCT: 34.8 % — ABNORMAL LOW (ref 36.0–46.0)
Hemoglobin: 12.2 g/dL (ref 12.0–15.0)
Immature Granulocytes: 1 %
Lymphocytes Relative: 48 %
Lymphs Abs: 0.9 10*3/uL (ref 0.7–4.0)
MCH: 31.4 pg (ref 26.0–34.0)
MCHC: 35.1 g/dL (ref 30.0–36.0)
MCV: 89.7 fL (ref 80.0–100.0)
Monocytes Absolute: 0.2 10*3/uL (ref 0.1–1.0)
Monocytes Relative: 9 %
Neutro Abs: 0.7 10*3/uL — ABNORMAL LOW (ref 1.7–7.7)
Neutrophils Relative %: 39 %
Platelet Count: 132 10*3/uL — ABNORMAL LOW (ref 150–400)
RBC: 3.88 MIL/uL (ref 3.87–5.11)
RDW: 17.9 % — ABNORMAL HIGH (ref 11.5–15.5)
WBC Count: 1.7 10*3/uL — ABNORMAL LOW (ref 4.0–10.5)
nRBC: 0 % (ref 0.0–0.2)

## 2023-01-26 LAB — LACTATE DEHYDROGENASE: LDH: 157 U/L (ref 98–192)

## 2023-01-26 MED ORDER — HEPARIN SOD (PORK) LOCK FLUSH 100 UNIT/ML IV SOLN
500.0000 [IU] | Freq: Once | INTRAVENOUS | Status: AC
  Administered 2023-01-26: 500 [IU]

## 2023-01-26 MED ORDER — SODIUM CHLORIDE 0.9% FLUSH
10.0000 mL | Freq: Once | INTRAVENOUS | Status: AC
  Administered 2023-01-26: 10 mL

## 2023-01-29 LAB — KAPPA/LAMBDA LIGHT CHAINS
Kappa free light chain: 15.5 mg/L (ref 3.3–19.4)
Kappa, lambda light chain ratio: 1.14 (ref 0.26–1.65)
Lambda free light chains: 13.6 mg/L (ref 5.7–26.3)

## 2023-02-01 LAB — MULTIPLE MYELOMA PANEL, SERUM
Albumin SerPl Elph-Mcnc: 3.7 g/dL (ref 2.9–4.4)
Albumin/Glob SerPl: 1.9 — ABNORMAL HIGH (ref 0.7–1.7)
Alpha 1: 0.3 g/dL (ref 0.0–0.4)
Alpha2 Glob SerPl Elph-Mcnc: 0.5 g/dL (ref 0.4–1.0)
B-Globulin SerPl Elph-Mcnc: 0.8 g/dL (ref 0.7–1.3)
Gamma Glob SerPl Elph-Mcnc: 0.5 g/dL (ref 0.4–1.8)
Globulin, Total: 2 g/dL — ABNORMAL LOW (ref 2.2–3.9)
IgA: 97 mg/dL (ref 87–352)
IgG (Immunoglobin G), Serum: 550 mg/dL — ABNORMAL LOW (ref 586–1602)
IgM (Immunoglobulin M), Srm: 24 mg/dL — ABNORMAL LOW (ref 26–217)
Total Protein ELP: 5.7 g/dL — ABNORMAL LOW (ref 6.0–8.5)

## 2023-02-02 ENCOUNTER — Inpatient Hospital Stay (HOSPITAL_BASED_OUTPATIENT_CLINIC_OR_DEPARTMENT_OTHER): Payer: BC Managed Care – PPO | Admitting: Hematology and Oncology

## 2023-02-02 ENCOUNTER — Other Ambulatory Visit: Payer: Self-pay

## 2023-02-02 ENCOUNTER — Inpatient Hospital Stay: Payer: BC Managed Care – PPO

## 2023-02-02 ENCOUNTER — Telehealth: Payer: Self-pay | Admitting: Hematology and Oncology

## 2023-02-02 VITALS — BP 127/64 | HR 83 | Temp 98.1°F | Resp 15 | Wt 175.7 lb

## 2023-02-02 DIAGNOSIS — C9001 Multiple myeloma in remission: Secondary | ICD-10-CM

## 2023-02-02 DIAGNOSIS — Z95828 Presence of other vascular implants and grafts: Secondary | ICD-10-CM

## 2023-02-02 DIAGNOSIS — C9000 Multiple myeloma not having achieved remission: Secondary | ICD-10-CM | POA: Diagnosis not present

## 2023-02-02 LAB — CMP (CANCER CENTER ONLY)
ALT: 9 U/L (ref 0–44)
AST: 12 U/L — ABNORMAL LOW (ref 15–41)
Albumin: 4.2 g/dL (ref 3.5–5.0)
Alkaline Phosphatase: 72 U/L (ref 38–126)
Anion gap: 8 (ref 5–15)
BUN: 16 mg/dL (ref 8–23)
CO2: 25 mmol/L (ref 22–32)
Calcium: 9.3 mg/dL (ref 8.9–10.3)
Chloride: 109 mmol/L (ref 98–111)
Creatinine: 0.93 mg/dL (ref 0.44–1.00)
GFR, Estimated: >60 mL/min (ref >60)
Glucose, Bld: 144 mg/dL — ABNORMAL HIGH (ref 70–99)
Potassium: 3.8 mmol/L (ref 3.5–5.1)
Sodium: 142 mmol/L (ref 135–145)
Total Bilirubin: 0.9 mg/dL (ref 0.3–1.2)
Total Protein: 6.4 g/dL — ABNORMAL LOW (ref 6.5–8.1)

## 2023-02-02 LAB — CBC WITH DIFFERENTIAL (CANCER CENTER ONLY)
Abs Immature Granulocytes: 0 10*3/uL (ref 0.00–0.07)
Basophils Absolute: 0 10*3/uL (ref 0.0–0.1)
Basophils Relative: 1 %
Eosinophils Absolute: 0.1 10*3/uL (ref 0.0–0.5)
Eosinophils Relative: 2 %
HCT: 35.9 % — ABNORMAL LOW (ref 36.0–46.0)
Hemoglobin: 12.7 g/dL (ref 12.0–15.0)
Immature Granulocytes: 0 %
Lymphocytes Relative: 35 %
Lymphs Abs: 0.8 10*3/uL (ref 0.7–4.0)
MCH: 31.8 pg (ref 26.0–34.0)
MCHC: 35.4 g/dL (ref 30.0–36.0)
MCV: 90 fL (ref 80.0–100.0)
Monocytes Absolute: 0.2 10*3/uL (ref 0.1–1.0)
Monocytes Relative: 10 %
Neutro Abs: 1.1 10*3/uL — ABNORMAL LOW (ref 1.7–7.7)
Neutrophils Relative %: 52 %
Platelet Count: 112 10*3/uL — ABNORMAL LOW (ref 150–400)
RBC: 3.99 MIL/uL (ref 3.87–5.11)
RDW: 16.4 % — ABNORMAL HIGH (ref 11.5–15.5)
WBC Count: 2.2 10*3/uL — ABNORMAL LOW (ref 4.0–10.5)
nRBC: 0 % (ref 0.0–0.2)

## 2023-02-02 LAB — LACTATE DEHYDROGENASE: LDH: 157 U/L (ref 98–192)

## 2023-02-02 MED ORDER — SODIUM CHLORIDE 0.9% FLUSH
10.0000 mL | Freq: Once | INTRAVENOUS | Status: AC
  Administered 2023-02-02: 10 mL

## 2023-02-02 MED ORDER — HEPARIN SOD (PORK) LOCK FLUSH 100 UNIT/ML IV SOLN
500.0000 [IU] | Freq: Once | INTRAVENOUS | Status: AC
  Administered 2023-02-02: 500 [IU]

## 2023-02-02 NOTE — Progress Notes (Signed)
[place] [phone]   [phone]  PROGRESS NOTE  Patient Care Team: [name] as PCP - General (Physician Assistant)  Hematological/Oncological History # Plasmacytoma with Multiple Myeloma  07/29/2021: CT cervical spine shows expansile lytic lesion which almost completely replaces the normal C2 vertebral body  08/24/2021: PET CT scan shows increased metabolic activity at C2 and C7  08/25/2021: biopsy of C2 lesion showed finding consistent with plasma cell neoplasm.  09/08/2021: start radiation therapy 09/12/2021: establish care with Dr. Lorenso Courier  09/28/2021: end radiation therapy 10/10/2021: Bone marrow biopsy. Results confirm plasma cell neoplasm consistent with multiple myeloma, kappa restricted.  10/26/2021: Cycle 1 Day 1 of VRD 11/18/2021: Cycle 2 Day 1 of VRD 11/25/2021: Velcade HELD due to full body rash. Resolved with steroid therapy.  12/16/2021: Cycle 1 Day 1 of Dara/Rev/Dex 12/30/2021: HELD Daratumumab due to toxicity. Patient unable to tolerate. Requested holding the medication  01/06/2022: Cycle 1 Day 1 of Kd  02/03/2022: Cycle 2 Day 1 of Kd 03/03/2022: Cycle 3 Day 1 of Kd 03/31/2022: Cycle 4 Day 1 of Kd 04/28/2022: Cycle 5 Day 1 of Kd 05/26/2022: Cycle 6 Day 1 of Kd  06/30/2022: Cycle 7 Day 1 of Kd  07/20/2022: Cycle 8 Day 1 of Kd  Sept 2023: start of maintenance revlimid 10 mg PO daily 21 of 28 day cycles.  01/05/2023: HELD Revlimid due to cytopenias.  Interval History:  Carol [name] 67 y.o. female with medical history significant for multiple myeloma with plasmacytoma who presents for a follow up visit. The patient's last visit was on 01/17/2023.  In the interim since her last visit she has held Revlimid due to cytopenias.   On exam today Carol [name] reports she has had no issues with infections in the interim since her last visit.  She said no runny nose or sore throat.  She reports that she does have some occasional sores in her mouth  but none of these are severe or terribly painful.  She notes her energy is a little low currently at 4 out of 10 but otherwise she is feeling well.  She notes that her appetite has been good and she is not having any lightheadedness, dizziness, or shortness of breath.  She reports that she continues to faithfully take her aspirin 81 mg p.o. daily.  She is willing to proceed with lenalidomide 5 mg p.o. daily once her blood counts recover.  She denies fevers, chills, night sweats, shortness of breath, chest pain or cough. She has no other complaints. Rest of the 10 point ROS is below.  MEDICAL HISTORY:  Past Medical History:  Diagnosis Date   Allergy    Anemia    Ankle edema    both ankles   Arthritis    hands,knees,elbows   Cancer ([place]) 2016   tumor kidney   Diabetes mellitus    type 2    Eczema    History of kidney stones    Hypothyroidism    Obesity    Sleep apnea    cpap machine setting of 3   Thyroid disease     SURGICAL HISTORY: Past Surgical History:  Procedure Laterality Date   BREAST BIOPSY Right 2001   CHOLECYSTECTOMY  2009   open   COLONOSCOPY     CYSTOSCOPY W/ RETROGRADES Left 11/22/2018   Procedure: CYSTOSCOPY WITH RETROGRADE PYELOGRAM;  Surgeon: Ardis Hughs, MD;  Location: [facility];  Service: Urology;  Laterality: Left;   CYSTOSCOPY WITH RETROGRADE  PYELOGRAM, URETEROSCOPY AND STENT PLACEMENT Right 10/03/2021   Procedure: CYSTOSCOPY WITH RETROGRADE PYELOGRAM, URETEROSCOPY,STONE EXTRACTION AND STENT PLACEMENT;  Surgeon: Ceasar Mons, MD;  Location: [facility];  Service: Urology;  Laterality: Right;   DILATATION & CURRETTAGE/HYSTEROSCOPY WITH RESECTOCOPE N/A 01/21/2013   Procedure: DILATATION & CURETTAGE/HYSTEROSCOPY WITH RESECTOCOPE AND RESECTION OF ENDOMETRIAL;  Surgeon: Selinda Orion, MD;  Location: [facility];  Service: Gynecology;  Laterality: N/A;   HARDWARE REMOVAL N/A 06/05/2022   Procedure: Removal of Posterior Cervical Hardware from  Occiput to Cervical Five;  Surgeon: Judith Part, MD;  Location: [place] [facility];  Service: Neurosurgery;  Laterality: N/A;   IR IMAGING GUIDED PORT INSERTION  02/01/2022   KNEE ARTHROSCOPY     left    POLYPECTOMY     POSTERIOR CERVICAL FUSION/FORAMINOTOMY N/A 08/25/2021   Procedure: Occiput to Cervical 5 Posterior cervical instrumented fusion with open biopsy of Cervical 2 Mass. Cervical Two Ganglionectomy;  Surgeon: Judith Part, MD;  Location: [place];  Service: Neurosurgery;  Laterality: N/A;   ROBOT ASSISTED PYELOPLASTY Left 11/22/2018   Procedure: XI ROBOTIC ASSISTED ATTEMPTED PYELOPLASTY CONVERTED TO NEPHRECTOMY/LYSIS OF ADHESIONS/UMBILICAL HERNIA REPAIR;  Surgeon: Ardis Hughs, MD;  Location: [facility];  Service: Urology;  Laterality: Left;   ROBOTIC ASSITED PARTIAL NEPHRECTOMY Left 06/11/2015   Procedure: LEFT ROBOTIC ASSISTED LAPAROSCOPIC  PARTIAL NEPHRECTOMY;  Surgeon: Ardis Hughs, MD;  Location: [facility];  Service: Urology;  Laterality: Left;   ROTATOR CUFF REPAIR Left 2011   URETERAL BIOPSY Left 02/14/2017   Procedure: URETERAL BIOPSY;  Surgeon: Ardis Hughs, MD;  Location: [facility];  Service: Urology;  Laterality: Left;   URETEROSCOPY WITH HOLMIUM LASER LITHOTRIPSY Left 02/14/2017   Procedure: URETEROSCOPY LITHOTRIPSY, URETERAL BALLOON DILATION;  Surgeon: Ardis Hughs, MD;  Location: [facility];  Service: Urology;  Laterality: Left;  With STENT    SOCIAL HISTORY: Social History   Socioeconomic History   Marital status: Married    Spouse name: Not on file   Number of children: Not on file   Years of education: Not on file   Highest education level: Not on file  Occupational History   Not on file  Tobacco Use   Smoking status: Never   Smokeless tobacco: Never  Vaping Use   Vaping Use: Never used  Substance and Sexual Activity   Alcohol use: Yes    Comment: occ   Drug use: No   Sexual activity: Not on file  Other Topics Concern   Not on file  Social  History Narrative   Not on file   Social Determinants of Health   Financial Resource Strain: Low Risk  (11/22/2018)   Overall Financial Resource Strain (CARDIA)    Difficulty of Paying Living Expenses: Not hard at all  Food Insecurity: Unknown (11/22/2018)   Hunger Vital Sign    Worried About [place] in the Last Year: Patient declined    [place] in the Last Year: Patient declined  Transportation Needs: Unknown (11/22/2018)   PRAPARE - Hydrologist (Medical): Patient declined    Lack of Transportation (Non-Medical): Patient declined  Physical Activity: Unknown (11/22/2018)   Exercise Vital Sign    Days of Exercise per Week: Patient declined    Minutes of Exercise per Session: Patient declined  Stress: No Stress Concern Present (11/22/2018)   [place] of Stress : Not  at all  Social Connections: Unknown (11/22/2018)   Social Connection and Isolation Panel [NHANES]    Frequency of Communication with Friends and Family: Patient declined    Frequency of Social Gatherings with Friends and Family: Patient declined    Attends Religious Services: Patient declined    Marine scientist or Organizations: Patient declined    Attends Archivist Meetings: Patient declined    Marital Status: Patient declined  Intimate Partner Violence: Unknown (11/22/2018)   Humiliation, Afraid, Rape, and Kick questionnaire    Fear of Current or Ex-Partner: Patient declined    Emotionally Abused: Patient declined    Physically Abused: Patient declined    Sexually Abused: Patient declined    FAMILY HISTORY: Family History  Problem Relation Age of Onset   Cancer Father     ALLERGIES:  is allergic to morphine.  MEDICATIONS:  Current Outpatient Medications  Medication Sig Dispense Refill   Ascorbic Acid (VITAMIN C ADULT GUMMIES PO) Take 1 tablet by mouth daily.     atorvastatin  (LIPITOR) 20 MG tablet Take 20 mg by mouth at bedtime.     Calcium Carbonate-Vitamin D 600-5 MG-MCG CAPS Take 1 capsule by mouth every morning.     Cholecalciferol (VITAMIN D3 PO) Take by mouth.     [employer] ASPIRIN LOW DOSE 81 MG tablet TAKE 1 TABLET (81 MG TOTAL) BY MOUTH DAILY. SWALLOW WHOLE. 90 tablet 3   Cyanocobalamin (VITAMIN B12) 1000 MCG TBCR Take 1 tablet by mouth every morning.     furosemide (LASIX) 20 MG tablet Take 20 mg by mouth daily.     KLOR-CON M20 20 MEQ tablet TAKE 1 TABLET BY MOUTH EVERY DAY 30 tablet 1   lenalidomide (REVLIMID) 10 MG capsule TAKE 1 CAPSULE BY MOUTH 1 TIME A DAY for 21 days [employer] Auth # [id number] Date obtained 01/04/2023 21 capsule 0   levothyroxine (SYNTHROID) 50 MCG tablet Take 50 mcg by mouth daily before breakfast.     lidocaine-prilocaine (EMLA) cream Apply 1 Application topically as needed. 30 g 0   Magnesium Hydroxide (MILK OF MAGNESIA PO) Take 15 mLs by mouth daily as needed (constipation/diarrhea).     ondansetron (ZOFRAN) 8 MG tablet Take 1 tablet (8 mg total) by mouth every 8 (eight) hours as needed. 30 tablet 0   oxyCODONE-acetaminophen (PERCOCET) 5-325 MG tablet Take 1 tablet by mouth every 4 (four) hours as needed (pain). 30 tablet 0   OZEMPIC, 0.25 OR 0.5 MG/DOSE, 2 MG/1.5ML SOPN Inject 0.5 mg into the skin every Saturday.     prednisoLONE acetate (PRED FORTE) 1 % ophthalmic suspension Place 1 drop into the left eye 4 (four) times daily.     PROAIR HFA 108 (90 BASE) MCG/ACT inhaler Inhale 2 puffs into the lungs every 4 (four) hours as needed for wheezing or shortness of breath. Uses seasonal  3   prochlorperazine (COMPAZINE) 10 MG tablet Take 1 tablet (10 mg total) by mouth every 6 (six) hours as needed for nausea or vomiting. 30 tablet 0   PROLENSA 0.07 % SOLN Place 1 drop into the right eye at bedtime.     Sennosides (SENOKOT PO) Take 1 tablet by mouth daily as needed (constipation). Bid     No current facility-administered medications for this  visit.    REVIEW OF SYSTEMS:   Constitutional: ( - ) fevers, ( - )  chills , ( - ) night sweats Eyes: ( - ) blurriness of vision, ( - ) double vision, ( - )  watery eyes Ears, nose, mouth, throat, and face: ( - ) mucositis, ( - ) sore throat Respiratory: ( - ) cough, ( - ) dyspnea, ( - ) wheezes Cardiovascular: ( - ) palpitation, ( - ) chest discomfort, ( - ) lower extremity swelling Gastrointestinal:  ( - ) nausea, ( - ) heartburn, ( -) change in bowel habits Skin: ( - ) abnormal skin rashes Lymphatics: ( - ) new lymphadenopathy, ( - ) easy bruising Neurological: ( - ) numbness, ( - ) tingling, ( - ) new weaknesses Behavioral/Psych: ( - ) mood change, ( - ) new changes  All other systems were reviewed with the patient and are negative.  PHYSICAL EXAMINATION: ECOG PERFORMANCE STATUS: 1 - Symptomatic but completely ambulatory  Vitals:   02/02/23 0820  BP: 127/64  Pulse: 83  Resp: 15  Temp: 98.1 F (36.7 C)  SpO2: 100%       Filed Weights   02/02/23 0820  Weight: 175 lb 11.2 oz (79.7 kg)      GENERAL: Well-appearing middle-age Caucasian female alert, no distress and comfortable SKIN: No evidence of rash, erythema, dry skin, or raised lesions. EYES: conjunctiva are pink and non-injected, sclera clear LUNGS: clear to auscultation and percussion with normal breathing effort HEART: regular rate & rhythm and no murmurs and no lower extremity edema Musculoskeletal: no cyanosis of digits and no clubbing  PSYCH: alert & oriented x 3, fluent speech NEURO: no focal motor/sensory deficits  LABORATORY DATA:  I have reviewed the data as listed    Latest Ref Rng & Units 02/02/2023    7:52 AM 01/26/2023    7:42 AM 01/17/2023   12:56 PM  CBC  WBC 4.0 - 10.5 K/uL 2.2  1.7  1.6   Hemoglobin 12.0 - 15.0 g/dL 12.7  12.2  11.5   Hematocrit 36.0 - 46.0 % 35.9  34.8  32.0   Platelets 150 - 400 K/uL 112  132  115        Latest Ref Rng & Units 02/02/2023    7:52 AM 01/26/2023    7:42 AM  01/17/2023   12:56 PM  CMP  Glucose 70 - 99 mg/dL 144  147  94   BUN 8 - 23 mg/dL 16  18  17    Creatinine 0.44 - 1.00 mg/dL 0.93  1.02  0.87   Sodium 135 - 145 mmol/L 142  143  141   Potassium 3.5 - 5.1 mmol/L 3.8  3.9  3.6   Chloride 98 - 111 mmol/L 109  110  109   CO2 22 - 32 mmol/L 25  25  27    Calcium 8.9 - 10.3 mg/dL 9.3  9.0  8.7   Total Protein 6.5 - 8.1 g/dL 6.4  6.3  5.9   Total Bilirubin 0.3 - 1.2 mg/dL 0.9  0.8  0.8   Alkaline Phos 38 - 126 U/L 72  73  67   AST 15 - 41 U/L 12  11  11    ALT 0 - 44 U/L 9  8  7      Lab Results  Component Value Date   MPROTEIN Not Observed 01/26/2023   MPROTEIN Not Observed 01/05/2023   MPROTEIN Not Observed 12/08/2022   Lab Results  Component Value Date   KPAFRELGTCHN 15.5 01/26/2023   KPAFRELGTCHN 24.2 (H) 01/05/2023   KPAFRELGTCHN 25.1 (H) 12/08/2022   LAMBDASER 13.6 01/26/2023   LAMBDASER 21.7 01/05/2023   LAMBDASER 18.6 12/08/2022   KAPLAMBRATIO 1.14 01/26/2023  KAPLAMBRATIO 1.12 01/05/2023   KAPLAMBRATIO 1.35 12/08/2022    RADIOGRAPHIC STUDIES: No results found.  ASSESSMENT & PLAN Carol [name] is a 67 y.o. female with medical history significant for multiple myeloma with plasmacytoma who presents for a follow up visit.   # Free Kappa Multiple Myeloma # Plasmacytoma  -- original finding of L2 plasmacytoma on biopsy of back lesion, bone marrow biopsy confirms greater than 60% plasma cells consistent with multiple myeloma. --Cycle 1 Day 1 of VRD treatment started on 10/26/2021.  --VRD therapy stopped on 11/25/2021 due to rash. Started Dara/Rev/Dex on 12/16/2021.  --will order monthly SPEP, UPEP, SFLC and beta 2 microglobulin --weekly CBC, CMP, and LDH --Cycle 1 Day 1 of Kd started on 01/06/2022 Plan:  --Labs show slow improvement of cytopenias with white blood cell count 2.2, hemoglobin 12.7, MCV 90, and platelets of 112 --Continue to HOLD Revlimid until recovery. Will decrease to maintenance dose to 5 mg p.o. revlimid once  counts recover  --Most recent myeloma las from 01/05/2023 showed no M-protein was detected. Continue monthly protein labs with SPEP and serum free light chains. --RTC in 2 weeks for labs and 4 weeks for clinic visit   #Left arm pain: --Resolved with holding Revlimid.   #Leukopenia/Thrombocytopenia/Anemia--improving --Likely secondary to chemotherapy --continue to monitor. Bleeding and neutropenic precautions given.  #Thrush, resolved: --Treated with Nystatin mouthwash with improvement.   #Metallic taste/weight loss-improving:  --Encouraged to increase caloric intake and supplement with protein shakes.  #Supportive Care -- chemotherapy education complete  -- port placement complete. -- sent prescription for EMLA cream -- zofran 8mg  q8H PRN and compazine 10mg  PO q6H for nausea -- Started zometa on 02/03/2022. Will continue q 3 months.HOLD zometa in June 2023 due to upcoming spine surgery. Last dose on 07/28/2022 -- no pain medication required at this time.   No orders of the defined types were placed in this encounter.  All questions were answered. The patient knows to call the clinic with any problems, questions or concerns.  I have spent a total of 30 minutes minutes of face-to-face and non-face-to-face time, preparing to see the patient, performing a medically appropriate examination, counseling and educating the patient, ordering medications/tests, documenting clinical information in the electronic health record, and care coordination.   Ledell Peoples, MD Department of Hematology/Oncology [place] [facility] Phone: [phone] Pager: [phone] Email: [email or web address]  02/04/2023 2:29 PM

## 2023-02-02 NOTE — Telephone Encounter (Signed)
Per 3/15 LOS reached out to patient to schedule lab/port flush visit. Left voicemail for patient

## 2023-02-04 ENCOUNTER — Encounter: Payer: Self-pay | Admitting: Hematology and Oncology

## 2023-02-04 MED ORDER — LIDOCAINE-PRILOCAINE 2.5-2.5 % EX CREA: 1.0000 | TOPICAL_CREAM | CUTANEOUS | 0 refills | Status: AC | PRN

## 2023-02-05 LAB — KAPPA/LAMBDA LIGHT CHAINS
Kappa free light chain: 15 mg/L (ref 3.3–19.4)
Kappa, lambda light chain ratio: 1.03 (ref 0.26–1.65)
Lambda free light chains: 14.5 mg/L (ref 5.7–26.3)

## 2023-02-09 LAB — MULTIPLE MYELOMA PANEL, SERUM
Albumin SerPl Elph-Mcnc: 3.7 g/dL (ref 2.9–4.4)
Albumin/Glob SerPl: 1.6 (ref 0.7–1.7)
Alpha 1: 0.3 g/dL (ref 0.0–0.4)
Alpha2 Glob SerPl Elph-Mcnc: 0.6 g/dL (ref 0.4–1.0)
B-Globulin SerPl Elph-Mcnc: 1 g/dL (ref 0.7–1.3)
Gamma Glob SerPl Elph-Mcnc: 0.5 g/dL (ref 0.4–1.8)
Globulin, Total: 2.4 g/dL (ref 2.2–3.9)
IgA: 99 mg/dL (ref 87–352)
IgG (Immunoglobin G), Serum: 571 mg/dL — ABNORMAL LOW (ref 586–1602)
IgM (Immunoglobulin M), Srm: 25 mg/dL — ABNORMAL LOW (ref 26–217)
Total Protein ELP: 6.1 g/dL (ref 6.0–8.5)

## 2023-02-15 ENCOUNTER — Other Ambulatory Visit: Payer: Self-pay | Admitting: *Deleted

## 2023-02-15 DIAGNOSIS — C9001 Multiple myeloma in remission: Secondary | ICD-10-CM

## 2023-02-15 NOTE — Progress Notes (Signed)
CBC w/diff ordered per verbal order from Dr. Lorenso Courier

## 2023-02-16 ENCOUNTER — Inpatient Hospital Stay: Payer: BC Managed Care – PPO

## 2023-02-16 ENCOUNTER — Other Ambulatory Visit: Payer: Self-pay

## 2023-02-16 DIAGNOSIS — C9001 Multiple myeloma in remission: Secondary | ICD-10-CM

## 2023-02-16 DIAGNOSIS — C9000 Multiple myeloma not having achieved remission: Secondary | ICD-10-CM | POA: Diagnosis not present

## 2023-02-16 DIAGNOSIS — Z95828 Presence of other vascular implants and grafts: Secondary | ICD-10-CM

## 2023-02-16 LAB — CBC WITH DIFFERENTIAL (CANCER CENTER ONLY)
Abs Immature Granulocytes: 0 10*3/uL (ref 0.00–0.07)
Basophils Absolute: 0 10*3/uL (ref 0.0–0.1)
Basophils Relative: 0 %
Eosinophils Absolute: 0 10*3/uL (ref 0.0–0.5)
Eosinophils Relative: 2 %
HCT: 34.8 % — ABNORMAL LOW (ref 36.0–46.0)
Hemoglobin: 12.8 g/dL (ref 12.0–15.0)
Immature Granulocytes: 0 %
Lymphocytes Relative: 37 %
Lymphs Abs: 0.9 10*3/uL (ref 0.7–4.0)
MCH: 32.9 pg (ref 26.0–34.0)
MCHC: 36.8 g/dL — ABNORMAL HIGH (ref 30.0–36.0)
MCV: 89.5 fL (ref 80.0–100.0)
Monocytes Absolute: 0.3 10*3/uL (ref 0.1–1.0)
Monocytes Relative: 10 %
Neutro Abs: 1.3 10*3/uL — ABNORMAL LOW (ref 1.7–7.7)
Neutrophils Relative %: 51 %
Platelet Count: 130 10*3/uL — ABNORMAL LOW (ref 150–400)
RBC: 3.89 MIL/uL (ref 3.87–5.11)
RDW: 14.7 % (ref 11.5–15.5)
WBC Count: 2.5 10*3/uL — ABNORMAL LOW (ref 4.0–10.5)
nRBC: 0 % (ref 0.0–0.2)

## 2023-02-16 MED ORDER — HEPARIN SOD (PORK) LOCK FLUSH 100 UNIT/ML IV SOLN
500.0000 [IU] | Freq: Once | INTRAVENOUS | Status: AC
  Administered 2023-02-16: 500 [IU]

## 2023-02-16 MED ORDER — SODIUM CHLORIDE 0.9% FLUSH
10.0000 mL | Freq: Once | INTRAVENOUS | Status: AC
  Administered 2023-02-16: 10 mL

## 2023-03-02 ENCOUNTER — Inpatient Hospital Stay: Payer: BC Managed Care – PPO | Attending: Radiation Oncology

## 2023-03-02 ENCOUNTER — Inpatient Hospital Stay (HOSPITAL_BASED_OUTPATIENT_CLINIC_OR_DEPARTMENT_OTHER): Payer: BC Managed Care – PPO | Admitting: Hematology and Oncology

## 2023-03-02 ENCOUNTER — Other Ambulatory Visit: Payer: Self-pay

## 2023-03-02 VITALS — BP 109/63 | HR 72 | Temp 97.5°F | Resp 19 | Ht 62.0 in | Wt 179.8 lb

## 2023-03-02 DIAGNOSIS — C9000 Multiple myeloma not having achieved remission: Secondary | ICD-10-CM | POA: Diagnosis not present

## 2023-03-02 DIAGNOSIS — D6481 Anemia due to antineoplastic chemotherapy: Secondary | ICD-10-CM

## 2023-03-02 DIAGNOSIS — D72819 Decreased white blood cell count, unspecified: Secondary | ICD-10-CM | POA: Diagnosis not present

## 2023-03-02 DIAGNOSIS — D696 Thrombocytopenia, unspecified: Secondary | ICD-10-CM | POA: Diagnosis not present

## 2023-03-02 DIAGNOSIS — D701 Agranulocytosis secondary to cancer chemotherapy: Secondary | ICD-10-CM | POA: Diagnosis not present

## 2023-03-02 DIAGNOSIS — Z79899 Other long term (current) drug therapy: Secondary | ICD-10-CM | POA: Diagnosis not present

## 2023-03-02 DIAGNOSIS — C9001 Multiple myeloma in remission: Secondary | ICD-10-CM | POA: Diagnosis not present

## 2023-03-02 DIAGNOSIS — T451X5A Adverse effect of antineoplastic and immunosuppressive drugs, initial encounter: Secondary | ICD-10-CM | POA: Diagnosis not present

## 2023-03-02 DIAGNOSIS — Z95828 Presence of other vascular implants and grafts: Secondary | ICD-10-CM

## 2023-03-02 LAB — CBC WITH DIFFERENTIAL (CANCER CENTER ONLY)
Abs Immature Granulocytes: 0 10*3/uL (ref 0.00–0.07)
Basophils Absolute: 0 10*3/uL (ref 0.0–0.1)
Basophils Relative: 1 %
Eosinophils Absolute: 0.1 10*3/uL (ref 0.0–0.5)
Eosinophils Relative: 2 %
HCT: 34.7 % — ABNORMAL LOW (ref 36.0–46.0)
Hemoglobin: 12.5 g/dL (ref 12.0–15.0)
Immature Granulocytes: 0 %
Lymphocytes Relative: 35 %
Lymphs Abs: 0.9 10*3/uL (ref 0.7–4.0)
MCH: 32.6 pg (ref 26.0–34.0)
MCHC: 36 g/dL (ref 30.0–36.0)
MCV: 90.6 fL (ref 80.0–100.0)
Monocytes Absolute: 0.2 10*3/uL (ref 0.1–1.0)
Monocytes Relative: 9 %
Neutro Abs: 1.3 10*3/uL — ABNORMAL LOW (ref 1.7–7.7)
Neutrophils Relative %: 53 %
Platelet Count: 125 10*3/uL — ABNORMAL LOW (ref 150–400)
RBC: 3.83 MIL/uL — ABNORMAL LOW (ref 3.87–5.11)
RDW: 14 % (ref 11.5–15.5)
WBC Count: 2.5 10*3/uL — ABNORMAL LOW (ref 4.0–10.5)
nRBC: 0 % (ref 0.0–0.2)

## 2023-03-02 LAB — CMP (CANCER CENTER ONLY)
ALT: 9 U/L (ref 0–44)
AST: 15 U/L (ref 15–41)
Albumin: 4.3 g/dL (ref 3.5–5.0)
Alkaline Phosphatase: 73 U/L (ref 38–126)
Anion gap: 6 (ref 5–15)
BUN: 19 mg/dL (ref 8–23)
CO2: 27 mmol/L (ref 22–32)
Calcium: 9.5 mg/dL (ref 8.9–10.3)
Chloride: 109 mmol/L (ref 98–111)
Creatinine: 0.97 mg/dL (ref 0.44–1.00)
GFR, Estimated: >60 mL/min (ref >60)
Glucose, Bld: 113 mg/dL — ABNORMAL HIGH (ref 70–99)
Potassium: 3.6 mmol/L (ref 3.5–5.1)
Sodium: 142 mmol/L (ref 135–145)
Total Bilirubin: 0.8 mg/dL (ref 0.3–1.2)
Total Protein: 6.6 g/dL (ref 6.5–8.1)

## 2023-03-02 LAB — LACTATE DEHYDROGENASE: LDH: 200 U/L — ABNORMAL HIGH (ref 98–192)

## 2023-03-02 MED ORDER — HEPARIN SOD (PORK) LOCK FLUSH 100 UNIT/ML IV SOLN
500.0000 [IU] | Freq: Once | INTRAVENOUS | Status: AC
  Administered 2023-03-02: 500 [IU]

## 2023-03-02 MED ORDER — SODIUM CHLORIDE 0.9% FLUSH
10.0000 mL | Freq: Once | INTRAVENOUS | Status: AC
  Administered 2023-03-02: 10 mL

## 2023-03-02 NOTE — Progress Notes (Signed)
[facility] [phone]   [phone]  PROGRESS NOTE  Patient Care Team: Barbarann Ehlers as PCP - General (Physician Assistant)  Hematological/Oncological History # Plasmacytoma with Multiple Myeloma  07/29/2021: CT cervical spine shows expansile lytic lesion which almost completely replaces the normal C2 vertebral body  08/24/2021: PET CT scan shows increased metabolic activity at C2 and C7  08/25/2021: biopsy of C2 lesion showed finding consistent with plasma cell neoplasm.  09/08/2021: start radiation therapy 09/12/2021: establish care with Dr. Leonides Schanz  09/28/2021: end radiation therapy 10/10/2021: Bone marrow biopsy. Results confirm plasma cell neoplasm consistent with multiple myeloma, kappa restricted.  10/26/2021: Cycle 1 Day 1 of VRD 11/18/2021: Cycle 2 Day 1 of VRD 11/25/2021: Velcade HELD due to full body rash. Resolved with steroid therapy.  12/16/2021: Cycle 1 Day 1 of Dara/Rev/Dex 12/30/2021: HELD Daratumumab due to toxicity. Patient unable to tolerate. Requested holding the medication  01/06/2022: Cycle 1 Day 1 of Kd  02/03/2022: Cycle 2 Day 1 of Kd 03/03/2022: Cycle 3 Day 1 of Kd 03/31/2022: Cycle 4 Day 1 of Kd 04/28/2022: Cycle 5 Day 1 of Kd 05/26/2022: Cycle 6 Day 1 of Kd  06/30/2022: Cycle 7 Day 1 of Kd  07/20/2022: Cycle 8 Day 1 of Kd  Sept 2023: start of maintenance revlimid 10 mg PO daily 21 of 28 day cycles.  01/05/2023: HELD Revlimid due to cytopenias.  Interval History:  Carol [name] 67 y.o. female with medical history significant for multiple myeloma with plasmacytoma who presents for a follow up visit. The patient's last visit was on 02/02/2023.  In the interim since her last visit she has held Revlimid due to cytopenias.   On exam today Carol [name] reports overall she is feeling well but is having some new symptoms.  She noted some numbness in her hands bilaterally particular when she is driving or holding her phone.  She reports her  energy is about a 4-5 out of 10 but she is able to do her daily routine without any difficulty.  She does get tired quite quickly.  She notes that she has been eating quite well and is gaining weight.  She is trying to eat more red meat in order to help bolster her blood counts.  She has been having difficulty with poor sleep which she thinks may be related to her neck.  Recently she slept on her recliner and reports it was an excellent night sleep but on most other occasions she can fall asleep but has difficulty staying asleep.  She has had a history of obstructive sleep apnea in the past but has not been wearing the mask due to her neck issues..  She denies fevers, chills, night sweats, shortness of breath, chest pain or cough. She has no other complaints. Rest of the 10 point ROS is below.  MEDICAL HISTORY:  Past Medical History:  Diagnosis Date   Allergy    Anemia    Ankle edema    both ankles   Arthritis    hands,knees,elbows   Cancer (HCC) 2016   tumor kidney   Diabetes mellitus    type 2    Eczema    History of kidney stones    Hypothyroidism    Obesity    Sleep apnea    cpap machine setting of 3   Thyroid disease     SURGICAL HISTORY: Past Surgical History:  Procedure Laterality Date   BREAST BIOPSY Right 2001   CHOLECYSTECTOMY  2009   open   COLONOSCOPY     CYSTOSCOPY W/ RETROGRADES Left 11/22/2018   Procedure: CYSTOSCOPY WITH RETROGRADE PYELOGRAM;  Surgeon: Crist Fat, MD;  Location: [facility];  Service: Urology;  Laterality: Left;   CYSTOSCOPY WITH RETROGRADE PYELOGRAM, URETEROSCOPY AND STENT PLACEMENT Right 10/03/2021   Procedure: CYSTOSCOPY WITH RETROGRADE PYELOGRAM, URETEROSCOPY,STONE EXTRACTION AND STENT PLACEMENT;  Surgeon: Rene Paci, MD;  Location: [facility];  Service: Urology;  Laterality: Right;   DILATATION & CURRETTAGE/HYSTEROSCOPY WITH RESECTOCOPE N/A 01/21/2013   Procedure: DILATATION & CURETTAGE/HYSTEROSCOPY WITH RESECTOCOPE AND RESECTION OF  ENDOMETRIAL;  Surgeon: Gretta Cool, MD;  Location: [facility];  Service: Gynecology;  Laterality: N/A;   HARDWARE REMOVAL N/A 06/05/2022   Procedure: Removal of Posterior Cervical Hardware from Occiput to Cervical Five;  Surgeon: Jadene Pierini, MD;  Location: [facility];  Service: Neurosurgery;  Laterality: N/A;   IR IMAGING GUIDED PORT INSERTION  02/01/2022   KNEE ARTHROSCOPY     left    POLYPECTOMY     POSTERIOR CERVICAL FUSION/FORAMINOTOMY N/A 08/25/2021   Procedure: Occiput to Cervical 5 Posterior cervical instrumented fusion with open biopsy of Cervical 2 Mass. Cervical Two Ganglionectomy;  Surgeon: Jadene Pierini, MD;  Location: [facility];  Service: Neurosurgery;  Laterality: N/A;   ROBOT ASSISTED PYELOPLASTY Left 11/22/2018   Procedure: XI ROBOTIC ASSISTED ATTEMPTED PYELOPLASTY CONVERTED TO NEPHRECTOMY/LYSIS OF ADHESIONS/UMBILICAL HERNIA REPAIR;  Surgeon: Crist Fat, MD;  Location: [facility];  Service: Urology;  Laterality: Left;   ROBOTIC ASSITED PARTIAL NEPHRECTOMY Left 06/11/2015   Procedure: LEFT ROBOTIC ASSISTED LAPAROSCOPIC  PARTIAL NEPHRECTOMY;  Surgeon: Crist Fat, MD;  Location: [facility];  Service: Urology;  Laterality: Left;   ROTATOR CUFF REPAIR Left 2011   URETERAL BIOPSY Left 02/14/2017   Procedure: URETERAL BIOPSY;  Surgeon: Crist Fat, MD;  Location: [facility];  Service: Urology;  Laterality: Left;   URETEROSCOPY WITH HOLMIUM LASER LITHOTRIPSY Left 02/14/2017   Procedure: URETEROSCOPY LITHOTRIPSY, URETERAL BALLOON DILATION;  Surgeon: Crist Fat, MD;  Location: [facility];  Service: Urology;  Laterality: Left;  With STENT    SOCIAL HISTORY: Social History   Socioeconomic History   Marital status: Married    Spouse name: Not on file   Number of children: Not on file   Years of education: Not on file   Highest education level: Not on file  Occupational History   Not on file  Tobacco Use   Smoking status: Never   Smokeless tobacco:  Never  Vaping Use   Vaping Use: Never used  Substance and Sexual Activity   Alcohol use: Yes    Comment: occ   Drug use: No   Sexual activity: Not on file  Other Topics Concern   Not on file  Social History Narrative   Not on file   Social Determinants of Health   Financial Resource Strain: Low Risk  (11/22/2018)   Overall Financial Resource Strain (CARDIA)    Difficulty of Paying Living Expenses: Not hard at all  Food Insecurity: Unknown (11/22/2018)   Hunger Vital Sign    Worried About Running Out of Food in the Last Year: Patient declined    Ran Out of Food in the Last Year: Patient declined  Transportation Needs: Unknown (11/22/2018)   PRAPARE - Transportation    Lack of Transportation (Medical): Patient declined    Lack of Transportation (Non-Medical): Patient declined  Physical Activity: Unknown (11/22/2018)   Exercise Vital Sign  Days of Exercise per Week: Patient declined    Minutes of Exercise per Session: Patient declined  Stress: No Stress Concern Present (11/22/2018)   [employer] of Occupational Health - Occupational Stress Questionnaire    Feeling of Stress : Not at all  Social Connections: Unknown (11/22/2018)   Social Connection and Isolation Panel [NHANES]    Frequency of Communication with Friends and Family: Patient declined    Frequency of Social Gatherings with Friends and Family: Patient declined    Attends Religious Services: Patient declined    Database administrator or Organizations: Patient declined    Attends Banker Meetings: Patient declined    Marital Status: Patient declined  Intimate Partner Violence: Unknown (11/22/2018)   Humiliation, Afraid, Rape, and Kick questionnaire    Fear of Current or Ex-Partner: Patient declined    Emotionally Abused: Patient declined    Physically Abused: Patient declined    Sexually Abused: Patient declined    FAMILY HISTORY: Family History  Problem Relation Age of Onset   Cancer Father      ALLERGIES:  is allergic to morphine.  MEDICATIONS:  Current Outpatient Medications  Medication Sig Dispense Refill   Ascorbic Acid (VITAMIN C ADULT GUMMIES PO) Take 1 tablet by mouth daily.     atorvastatin (LIPITOR) 20 MG tablet Take 20 mg by mouth at bedtime.     Calcium Carbonate-Vitamin D 600-5 MG-MCG CAPS Take 1 capsule by mouth every morning.     Cholecalciferol (VITAMIN D3 PO) Take by mouth.     [employer] ASPIRIN LOW DOSE 81 MG tablet TAKE 1 TABLET (81 MG TOTAL) BY MOUTH DAILY. SWALLOW WHOLE. 90 tablet 3   Cyanocobalamin (VITAMIN B12) 1000 MCG TBCR Take 1 tablet by mouth every morning.     furosemide (LASIX) 20 MG tablet Take 20 mg by mouth daily.     KLOR-CON M20 20 MEQ tablet TAKE 1 TABLET BY MOUTH EVERY DAY 30 tablet 1   lenalidomide (REVLIMID) 10 MG capsule TAKE 1 CAPSULE BY MOUTH 1 TIME A DAY for 21 days [employer] Auth # [id number] Date obtained 01/04/2023 21 capsule 0   levothyroxine (SYNTHROID) 50 MCG tablet Take 50 mcg by mouth daily before breakfast.     lidocaine-prilocaine (EMLA) cream Apply 1 Application topically as needed. 30 g 0   Magnesium Hydroxide (MILK OF MAGNESIA PO) Take 15 mLs by mouth daily as needed (constipation/diarrhea).     ondansetron (ZOFRAN) 8 MG tablet Take 1 tablet (8 mg total) by mouth every 8 (eight) hours as needed. 30 tablet 0   oxyCODONE-acetaminophen (PERCOCET) 5-325 MG tablet Take 1 tablet by mouth every 4 (four) hours as needed (pain). 30 tablet 0   OZEMPIC, 0.25 OR 0.5 MG/DOSE, 2 MG/1.5ML SOPN Inject 0.5 mg into the skin every Saturday.     prednisoLONE acetate (PRED FORTE) 1 % ophthalmic suspension Place 1 drop into the left eye 4 (four) times daily.     PROAIR HFA 108 (90 BASE) MCG/ACT inhaler Inhale 2 puffs into the lungs every 4 (four) hours as needed for wheezing or shortness of breath. Uses seasonal  3   prochlorperazine (COMPAZINE) 10 MG tablet Take 1 tablet (10 mg total) by mouth every 6 (six) hours as needed for nausea or vomiting. 30 tablet  0   PROLENSA 0.07 % SOLN Place 1 drop into the right eye at bedtime.     Sennosides (SENOKOT PO) Take 1 tablet by mouth daily as needed (constipation). Bid  No current facility-administered medications for this visit.    REVIEW OF SYSTEMS:   Constitutional: ( - ) fevers, ( - )  chills , ( - ) night sweats Eyes: ( - ) blurriness of vision, ( - ) double vision, ( - ) watery eyes Ears, nose, mouth, throat, and face: ( - ) mucositis, ( - ) sore throat Respiratory: ( - ) cough, ( - ) dyspnea, ( - ) wheezes Cardiovascular: ( - ) palpitation, ( - ) chest discomfort, ( - ) lower extremity swelling Gastrointestinal:  ( - ) nausea, ( - ) heartburn, ( -) change in bowel habits Skin: ( - ) abnormal skin rashes Lymphatics: ( - ) new lymphadenopathy, ( - ) easy bruising Neurological: ( - ) numbness, ( - ) tingling, ( - ) new weaknesses Behavioral/Psych: ( - ) mood change, ( - ) new changes  All other systems were reviewed with the patient and are negative.  PHYSICAL EXAMINATION: ECOG PERFORMANCE STATUS: 1 - Symptomatic but completely ambulatory  Vitals:   03/02/23 0817  BP: 109/63  Pulse: 72  Resp: 19  Temp: (!) 97.5 F (36.4 C)  SpO2: 100%        Filed Weights   03/02/23 0817  Weight: 179 lb 12.8 oz (81.6 kg)       GENERAL: Well-appearing middle-age Caucasian female alert, no distress and comfortable SKIN: No evidence of rash, erythema, dry skin, or raised lesions. EYES: conjunctiva are pink and non-injected, sclera clear LUNGS: clear to auscultation and percussion with normal breathing effort HEART: regular rate & rhythm and no murmurs and no lower extremity edema Musculoskeletal: no cyanosis of digits and no clubbing  PSYCH: alert & oriented x 3, fluent speech NEURO: no focal motor/sensory deficits  LABORATORY DATA:  I have reviewed the data as listed    Latest Ref Rng & Units 03/02/2023    8:00 AM 02/16/2023   10:35 AM 02/02/2023    7:52 AM  CBC  WBC 4.0 - 10.5  K/uL 2.5  2.5  2.2   Hemoglobin 12.0 - 15.0 g/dL 02.7  25.3  66.4   Hematocrit 36.0 - 46.0 % 34.7  34.8  35.9   Platelets 150 - 400 K/uL 125  130  112        Latest Ref Rng & Units 03/02/2023    8:00 AM 02/02/2023    7:52 AM 01/26/2023    7:42 AM  CMP  Glucose 70 - 99 mg/dL 403  474  259   BUN 8 - 23 mg/dL Creatinine 0.44 - 1.00 mg/dL 5.63  8.75  6.43   Sodium 135 - 145 mmol/L 142  142  143   Potassium 3.5 - 5.1 mmol/L 3.6  3.8  3.9   Chloride 98 - 111 mmol/L 109  109  110   CO2 22 - 32 mmol/L Calcium 8.9 - 10.3 mg/dL 9.5  9.3  9.0   Total Protein 6.5 - 8.1 g/dL 6.6  6.4  6.3   Total Bilirubin 0.3 - 1.2 mg/dL 0.8  0.9  0.8   Alkaline Phos 38 - 126 U/L 73  72  73   AST 15 - 41 U/L ALT 0 - 44 U/L Lab Results  Component Value Date   MPROTEIN Not Observed 02/02/2023   MPROTEIN Not Observed 01/26/2023  MPROTEIN Not Observed 01/05/2023   Lab Results  Component Value Date   KPAFRELGTCHN 15.0 02/02/2023   KPAFRELGTCHN 15.5 01/26/2023   KPAFRELGTCHN 24.2 (H) 01/05/2023   LAMBDASER 14.5 02/02/2023   LAMBDASER 13.6 01/26/2023   LAMBDASER 21.7 01/05/2023   KAPLAMBRATIO 1.03 02/02/2023   KAPLAMBRATIO 1.14 01/26/2023   KAPLAMBRATIO 1.12 01/05/2023    RADIOGRAPHIC STUDIES: No results found.  ASSESSMENT & PLAN Carol [name] is a 67 y.o. female with medical history significant for multiple myeloma with plasmacytoma who presents for a follow up visit.   # Free Kappa Multiple Myeloma # Plasmacytoma  -- original finding of L2 plasmacytoma on biopsy of back lesion, bone marrow biopsy confirms greater than 60% plasma cells consistent with multiple myeloma. --Cycle 1 Day 1 of VRD treatment started on 10/26/2021.  --VRD therapy stopped on 11/25/2021 due to rash. Started Dara/Rev/Dex on 12/16/2021.  --will order monthly SPEP, UPEP, SFLC and beta 2 microglobulin --weekly CBC, CMP, and LDH --Cycle 1 Day 1 of Kd started on 01/06/2022 Plan:   --Labs show slow improvement of cytopenias with white blood cell count 2.5, hemoglobin 12.5, MCV 90.6, and platelets of 125 --Continue to HOLD Revlimid until recovery. Will consider decrease of maintenance dose to 5 mg p.o. revlimid (21 days on, 7 off) once counts recover  --Most recent myeloma las from 01/05/2023 showed no M-protein was undetectable. Continue monthly protein labs with SPEP and serum free light chains. --RTC in 2 weeks for labs and 4 weeks for clinic visit   #Left arm pain: --Resolved with holding Revlimid.   #Leukopenia/Thrombocytopenia/Anemia--improving --Likely secondary to chemotherapy --continue to monitor. Bleeding and neutropenic precautions given.  #Thrush, resolved: --Treated with Nystatin mouthwash with improvement.   #Metallic taste/weight loss-improving:  --Encouraged to increase caloric intake and supplement with protein shakes.  #Supportive Care -- chemotherapy education complete  -- port placement complete. -- sent prescription for EMLA cream -- zofran  q8H PRN and compazine  PO q6H for nausea -- Started zometa on 02/03/2022. Will continue q 3 months.HOLD zometa in June 2023 due to upcoming spine surgery. Last dose on 07/28/2022 -- no pain medication required at this time.   No orders of the defined types were placed in this encounter.  All questions were answered. The patient knows to call the clinic with any problems, questions or concerns.  I have spent a total of 30 minutes minutes of face-to-face and non-face-to-face time, preparing to see the patient, performing a medically appropriate examination, counseling and educating the patient, ordering medications/tests, documenting clinical information in the electronic health record, and care coordination.   Ulysees Barns, MD Department of Hematology/Oncology [facility] Phone: [phone] Pager: [phone] Email:  [email or web address]  03/02/2023 8:44 AM

## 2023-03-04 ENCOUNTER — Other Ambulatory Visit: Payer: Self-pay

## 2023-03-05 LAB — KAPPA/LAMBDA LIGHT CHAINS
Kappa free light chain: 11.6 mg/L (ref 3.3–19.4)
Kappa, lambda light chain ratio: 1.23 (ref 0.26–1.65)
Lambda free light chains: 9.4 mg/L (ref 5.7–26.3)

## 2023-03-07 LAB — MULTIPLE MYELOMA PANEL, SERUM
Albumin SerPl Elph-Mcnc: 3.9 g/dL (ref 2.9–4.4)
Albumin/Glob SerPl: 1.8 — ABNORMAL HIGH (ref 0.7–1.7)
Alpha 1: 0.2 g/dL (ref 0.0–0.4)
Alpha2 Glob SerPl Elph-Mcnc: 0.5 g/dL (ref 0.4–1.0)
B-Globulin SerPl Elph-Mcnc: 1 g/dL (ref 0.7–1.3)
Gamma Glob SerPl Elph-Mcnc: 0.5 g/dL (ref 0.4–1.8)
Globulin, Total: 2.2 g/dL (ref 2.2–3.9)
IgA: 87 mg/dL (ref 87–352)
IgG (Immunoglobin G), Serum: 566 mg/dL — ABNORMAL LOW (ref 586–1602)
IgM (Immunoglobulin M), Srm: 25 mg/dL — ABNORMAL LOW (ref 26–217)
Total Protein ELP: 6.1 g/dL (ref 6.0–8.5)

## 2023-03-30 ENCOUNTER — Other Ambulatory Visit: Payer: Self-pay

## 2023-03-30 ENCOUNTER — Inpatient Hospital Stay: Payer: BC Managed Care – PPO | Attending: Radiation Oncology

## 2023-03-30 ENCOUNTER — Inpatient Hospital Stay (HOSPITAL_BASED_OUTPATIENT_CLINIC_OR_DEPARTMENT_OTHER): Payer: BC Managed Care – PPO | Admitting: Physician Assistant

## 2023-03-30 VITALS — BP 119/75 | HR 78 | Temp 97.9°F | Resp 14 | Wt 179.4 lb

## 2023-03-30 DIAGNOSIS — M542 Cervicalgia: Secondary | ICD-10-CM | POA: Diagnosis not present

## 2023-03-30 DIAGNOSIS — C9000 Multiple myeloma not having achieved remission: Secondary | ICD-10-CM

## 2023-03-30 DIAGNOSIS — C9001 Multiple myeloma in remission: Secondary | ICD-10-CM

## 2023-03-30 DIAGNOSIS — Z95828 Presence of other vascular implants and grafts: Secondary | ICD-10-CM

## 2023-03-30 DIAGNOSIS — Z7982 Long term (current) use of aspirin: Secondary | ICD-10-CM | POA: Diagnosis not present

## 2023-03-30 DIAGNOSIS — Z79899 Other long term (current) drug therapy: Secondary | ICD-10-CM | POA: Insufficient documentation

## 2023-03-30 DIAGNOSIS — Z7961 Long term (current) use of immunomodulator: Secondary | ICD-10-CM | POA: Diagnosis not present

## 2023-03-30 DIAGNOSIS — D72819 Decreased white blood cell count, unspecified: Secondary | ICD-10-CM | POA: Diagnosis not present

## 2023-03-30 DIAGNOSIS — M79602 Pain in left arm: Secondary | ICD-10-CM | POA: Diagnosis not present

## 2023-03-30 DIAGNOSIS — Z905 Acquired absence of kidney: Secondary | ICD-10-CM | POA: Diagnosis not present

## 2023-03-30 LAB — CBC WITH DIFFERENTIAL (CANCER CENTER ONLY)
Abs Immature Granulocytes: 0.01 10*3/uL (ref 0.00–0.07)
Basophils Absolute: 0 10*3/uL (ref 0.0–0.1)
Basophils Relative: 1 %
Eosinophils Absolute: 0 10*3/uL (ref 0.0–0.5)
Eosinophils Relative: 1 %
HCT: 38.3 % (ref 36.0–46.0)
Hemoglobin: 13.2 g/dL (ref 12.0–15.0)
Immature Granulocytes: 0 %
Lymphocytes Relative: 34 %
Lymphs Abs: 0.9 10*3/uL (ref 0.7–4.0)
MCH: 31.7 pg (ref 26.0–34.0)
MCHC: 34.5 g/dL (ref 30.0–36.0)
MCV: 91.8 fL (ref 80.0–100.0)
Monocytes Absolute: 0.3 10*3/uL (ref 0.1–1.0)
Monocytes Relative: 9 %
Neutro Abs: 1.5 10*3/uL — ABNORMAL LOW (ref 1.7–7.7)
Neutrophils Relative %: 55 %
Platelet Count: 129 10*3/uL — ABNORMAL LOW (ref 150–400)
RBC: 4.17 MIL/uL (ref 3.87–5.11)
RDW: 13 % (ref 11.5–15.5)
WBC Count: 2.7 10*3/uL — ABNORMAL LOW (ref 4.0–10.5)
nRBC: 0 % (ref 0.0–0.2)

## 2023-03-30 LAB — CMP (CANCER CENTER ONLY)
ALT: 11 U/L (ref 0–44)
AST: 13 U/L — ABNORMAL LOW (ref 15–41)
Albumin: 4.4 g/dL (ref 3.5–5.0)
Alkaline Phosphatase: 76 U/L (ref 38–126)
Anion gap: 7 (ref 5–15)
BUN: 25 mg/dL — ABNORMAL HIGH (ref 8–23)
CO2: 26 mmol/L (ref 22–32)
Calcium: 9.2 mg/dL (ref 8.9–10.3)
Chloride: 108 mmol/L (ref 98–111)
Creatinine: 1 mg/dL (ref 0.44–1.00)
GFR, Estimated: >60 mL/min (ref >60)
Glucose, Bld: 112 mg/dL — ABNORMAL HIGH (ref 70–99)
Potassium: 4.2 mmol/L (ref 3.5–5.1)
Sodium: 141 mmol/L (ref 135–145)
Total Bilirubin: 0.9 mg/dL (ref 0.3–1.2)
Total Protein: 6.7 g/dL (ref 6.5–8.1)

## 2023-03-30 LAB — LACTATE DEHYDROGENASE: LDH: 193 U/L — ABNORMAL HIGH (ref 98–192)

## 2023-03-30 MED ORDER — SODIUM CHLORIDE 0.9% FLUSH
10.0000 mL | Freq: Once | INTRAVENOUS | Status: AC
  Administered 2023-03-30: 10 mL

## 2023-03-30 MED ORDER — HEPARIN SOD (PORK) LOCK FLUSH 100 UNIT/ML IV SOLN: 250.0000 [IU] | Freq: Once | INTRAVENOUS | Status: DC

## 2023-03-30 MED ORDER — HEPARIN SOD (PORK) LOCK FLUSH 100 UNIT/ML IV SOLN
500.0000 [IU] | Freq: Once | INTRAVENOUS | Status: AC
  Administered 2023-03-30: 500 [IU]

## 2023-03-30 NOTE — Progress Notes (Signed)
[facility] [phone]   [phone]  PROGRESS NOTE  Patient Care Team: Barbarann Ehlers as PCP - General (Physician Assistant)  Hematological/Oncological History # Plasmacytoma with Multiple Myeloma  07/29/2021: CT cervical spine shows expansile lytic lesion which almost completely replaces the normal C2 vertebral body  08/24/2021: PET CT scan shows increased metabolic activity at C2 and C7  08/25/2021: biopsy of C2 lesion showed finding consistent with plasma cell neoplasm.  09/08/2021: start radiation therapy 09/12/2021: establish care with Dr. Leonides Schanz  09/28/2021: end radiation therapy 10/10/2021: Bone marrow biopsy. Results confirm plasma cell neoplasm consistent with multiple myeloma, kappa restricted.  10/26/2021: Cycle 1 Day 1 of VRD 11/18/2021: Cycle 2 Day 1 of VRD 11/25/2021: Velcade HELD due to full body rash. Resolved with steroid therapy.  12/16/2021: Cycle 1 Day 1 of Dara/Rev/Dex 12/30/2021: HELD Daratumumab due to toxicity. Patient unable to tolerate. Requested holding the medication  01/06/2022: Cycle 1 Day 1 of Kd  02/03/2022: Cycle 2 Day 1 of Kd 03/03/2022: Cycle 3 Day 1 of Kd 03/31/2022: Cycle 4 Day 1 of Kd 04/28/2022: Cycle 5 Day 1 of Kd 05/26/2022: Cycle 6 Day 1 of Kd  06/30/2022: Cycle 7 Day 1 of Kd  07/20/2022: Cycle 8 Day 1 of Kd  Sept 2023: start of maintenance revlimid 10 mg PO daily 21 of 28 day cycles.  01/05/2023: HELD Revlimid due to cytopenias.  Interval History:  Carol [name] 67 y.o. female with medical history significant for multiple myeloma with plasmacytoma who presents for a follow up visit. The patient's last visit was on 03/02/2023.  In the interim since her last visit she has held Revlimid due to cytopenias.   On exam today Carol [name] reports new onset left lateral neck pain over the last month. She adds that her mobility of her neck has decreased some. She is concerned due to the history of her C2 lytic destruction and  fracture. She is wearing her neck brace more often. She is otherwise feeling well and continues to work. Her appetite is stable and she denies any noticeable weight changes. She denies nausea, vomiting or abdominal pain. Her bowel habits are unchanged. She still bruises easily but no signs of active bleeding. She denies fevers, chills,  sweats, shortness of breath, chest pain or cough.She has no other complaints. Rest of the 10 point ROS is below.  MEDICAL HISTORY:  Past Medical History:  Diagnosis Date   Allergy    Anemia    Ankle edema    both ankles   Arthritis    hands,knees,elbows   Cancer (HCC) 2016   tumor kidney   Diabetes mellitus    type 2    Eczema    History of kidney stones    Hypothyroidism    Obesity    Sleep apnea    cpap machine setting of 3   Thyroid disease     SURGICAL HISTORY: Past Surgical History:  Procedure Laterality Date   BREAST BIOPSY Right 2001   CHOLECYSTECTOMY  2009   open   COLONOSCOPY     CYSTOSCOPY W/ RETROGRADES Left 11/22/2018   Procedure: CYSTOSCOPY WITH RETROGRADE PYELOGRAM;  Surgeon: Crist Fat, MD;  Location: [facility];  Service: Urology;  Laterality: Left;   CYSTOSCOPY WITH RETROGRADE PYELOGRAM, URETEROSCOPY AND STENT PLACEMENT Right 10/03/2021   Procedure: CYSTOSCOPY WITH RETROGRADE PYELOGRAM, URETEROSCOPY,STONE EXTRACTION AND STENT PLACEMENT;  Surgeon: Rene Paci, MD;  Location: [facility];  Service: Urology;  Laterality: Right;  DILATATION & CURRETTAGE/HYSTEROSCOPY WITH RESECTOCOPE N/A 01/21/2013   Procedure: DILATATION & CURETTAGE/HYSTEROSCOPY WITH RESECTOCOPE AND RESECTION OF ENDOMETRIAL;  Surgeon: Gretta Cool, MD;  Location: [facility];  Service: Gynecology;  Laterality: N/A;   HARDWARE REMOVAL N/A 06/05/2022   Procedure: Removal of Posterior Cervical Hardware from Occiput to Cervical Five;  Surgeon: Jadene Pierini, MD;  Location: [facility];  Service: Neurosurgery;  Laterality: N/A;   IR IMAGING  GUIDED PORT INSERTION  02/01/2022   KNEE ARTHROSCOPY     left    POLYPECTOMY     POSTERIOR CERVICAL FUSION/FORAMINOTOMY N/A 08/25/2021   Procedure: Occiput to Cervical 5 Posterior cervical instrumented fusion with open biopsy of Cervical 2 Mass. Cervical Two Ganglionectomy;  Surgeon: Jadene Pierini, MD;  Location: [facility];  Service: Neurosurgery;  Laterality: N/A;   ROBOT ASSISTED PYELOPLASTY Left 11/22/2018   Procedure: XI ROBOTIC ASSISTED ATTEMPTED PYELOPLASTY CONVERTED TO NEPHRECTOMY/LYSIS OF ADHESIONS/UMBILICAL HERNIA REPAIR;  Surgeon: Crist Fat, MD;  Location: [facility];  Service: Urology;  Laterality: Left;   ROBOTIC ASSITED PARTIAL NEPHRECTOMY Left 06/11/2015   Procedure: LEFT ROBOTIC ASSISTED LAPAROSCOPIC  PARTIAL NEPHRECTOMY;  Surgeon: Crist Fat, MD;  Location: [facility];  Service: Urology;  Laterality: Left;   ROTATOR CUFF REPAIR Left 2011   URETERAL BIOPSY Left 02/14/2017   Procedure: URETERAL BIOPSY;  Surgeon: Crist Fat, MD;  Location: [facility];  Service: Urology;  Laterality: Left;   URETEROSCOPY WITH HOLMIUM LASER LITHOTRIPSY Left 02/14/2017   Procedure: URETEROSCOPY LITHOTRIPSY, URETERAL BALLOON DILATION;  Surgeon: Crist Fat, MD;  Location: [facility];  Service: Urology;  Laterality: Left;  With STENT    SOCIAL HISTORY: Social History   Socioeconomic History   Marital status: Married    Spouse name: Not on file   Number of children: Not on file   Years of education: Not on file   Highest education level: Not on file  Occupational History   Not on file  Tobacco Use   Smoking status: Never   Smokeless tobacco: Never  Vaping Use   Vaping Use: Never used  Substance and Sexual Activity   Alcohol use: Yes    Comment: occ   Drug use: No   Sexual activity: Not on file  Other Topics Concern   Not on file  Social History Narrative   Not on file   Social Determinants of Health   Financial Resource Strain: Low Risk  (11/22/2018)   Overall Financial  Resource Strain (CARDIA)    Difficulty of Paying Living Expenses: Not hard at all  Food Insecurity: Unknown (11/22/2018)   Hunger Vital Sign    Worried About Running Out of Food in the Last Year: Patient declined    Ran Out of Food in the Last Year: Patient declined  Transportation Needs: Unknown (11/22/2018)   PRAPARE - Administrator, Civil Service (Medical): Patient declined    Lack of Transportation (Non-Medical): Patient declined  Physical Activity: Unknown (11/22/2018)   Exercise Vital Sign    Days of Exercise per Week: Patient declined    Minutes of Exercise per Session: Patient declined  Stress: No Stress Concern Present (11/22/2018)   [employer] of Occupational Health - Occupational Stress Questionnaire    Feeling of Stress : Not at all  Social Connections: Unknown (11/22/2018)   Social Connection and Isolation Panel [NHANES]    Frequency of Communication with Friends and Family: Patient declined    Frequency of Social Gatherings with Friends and  Family: Patient declined    Attends Religious Services: Patient declined    Active Member of Clubs or Organizations: Patient declined    Attends Banker Meetings: Patient declined    Marital Status: Patient declined  Intimate Partner Violence: Unknown (11/22/2018)   Humiliation, Afraid, Rape, and Kick questionnaire    Fear of Current or Ex-Partner: Patient declined    Emotionally Abused: Patient declined    Physically Abused: Patient declined    Sexually Abused: Patient declined    FAMILY HISTORY: Family History  Problem Relation Age of Onset   Cancer Father     ALLERGIES:  is allergic to morphine.  MEDICATIONS:  Current Outpatient Medications  Medication Sig Dispense Refill   Ascorbic Acid (VITAMIN C ADULT GUMMIES PO) Take 1 tablet by mouth daily.     atorvastatin (LIPITOR) 20 MG tablet Take 20 mg by mouth at bedtime.     Calcium Carbonate-Vitamin D 600-5 MG-MCG CAPS Take 1 capsule by mouth every  morning.     Cholecalciferol (VITAMIN D3 PO) Take by mouth.     [employer] ASPIRIN LOW DOSE 81 MG tablet TAKE 1 TABLET (81 MG TOTAL) BY MOUTH DAILY. SWALLOW WHOLE. 90 tablet 3   Cyanocobalamin (VITAMIN B12) 1000 MCG TBCR Take 1 tablet by mouth every morning.     furosemide (LASIX) 20 MG tablet Take 20 mg by mouth daily.     KLOR-CON M20 20 MEQ tablet TAKE 1 TABLET BY MOUTH EVERY DAY 30 tablet 1   lenalidomide (REVLIMID) 10 MG capsule TAKE 1 CAPSULE BY MOUTH 1 TIME A DAY for 21 days [employer] Auth # [id number] Date obtained 01/04/2023 21 capsule 0   levothyroxine (SYNTHROID) 50 MCG tablet Take 50 mcg by mouth daily before breakfast.     lidocaine-prilocaine (EMLA) cream Apply 1 Application topically as needed. 30 g 0   Magnesium Hydroxide (MILK OF MAGNESIA PO) Take 15 mLs by mouth daily as needed (constipation/diarrhea).     ondansetron (ZOFRAN) 8 MG tablet Take 1 tablet (8 mg total) by mouth every 8 (eight) hours as needed. 30 tablet 0   oxyCODONE-acetaminophen (PERCOCET) 5-325 MG tablet Take 1 tablet by mouth every 4 (four) hours as needed (pain). 30 tablet 0   OZEMPIC, 0.25 OR 0.5 MG/DOSE, 2 MG/1.5ML SOPN Inject 0.5 mg into the skin every Saturday.     prednisoLONE acetate (PRED FORTE) 1 % ophthalmic suspension Place 1 drop into the left eye 4 (four) times daily.     PROAIR HFA 108 (90 BASE) MCG/ACT inhaler Inhale 2 puffs into the lungs every 4 (four) hours as needed for wheezing or shortness of breath. Uses seasonal  3   prochlorperazine (COMPAZINE) 10 MG tablet Take 1 tablet (10 mg total) by mouth every 6 (six) hours as needed for nausea or vomiting. 30 tablet 0   PROLENSA 0.07 % SOLN Place 1 drop into the right eye at bedtime.     Sennosides (SENOKOT PO) Take 1 tablet by mouth daily as needed (constipation). Bid     No current facility-administered medications for this visit.    REVIEW OF SYSTEMS:   Constitutional: ( - ) fevers, ( - )  chills , ( - ) night sweats Eyes: ( - ) blurriness of vision, (  - ) double vision, ( - ) watery eyes Ears, nose, mouth, throat, and face: ( - ) mucositis, ( - ) sore throat Respiratory: ( - ) cough, ( - ) dyspnea, ( - ) wheezes Cardiovascular: ( - )  palpitation, ( - ) chest discomfort, ( - ) lower extremity swelling Gastrointestinal:  ( - ) nausea, ( - ) heartburn, ( -) change in bowel habits Skin: ( - ) abnormal skin rashes Lymphatics: ( - ) new lymphadenopathy, ( - ) easy bruising Neurological: ( - ) numbness, ( - ) tingling, ( - ) new weaknesses Behavioral/Psych: ( - ) mood change, ( - ) new changes  All other systems were reviewed with the patient and are negative.  PHYSICAL EXAMINATION: ECOG PERFORMANCE STATUS: 1 - Symptomatic but completely ambulatory  Vitals:   03/30/23 0822  BP: 119/75  Pulse: 78  Resp: 14  Temp: 97.9 F (36.6 C)  SpO2: 100%        Filed Weights   03/30/23 0822  Weight: 179 lb 6.4 oz (81.4 kg)       GENERAL: Well-appearing middle-age Caucasian female alert, no distress and comfortable SKIN: No evidence of rash, erythema, dry skin, or raised lesions. EYES: conjunctiva are pink and non-injected, sclera clear LUNGS: clear to auscultation and percussion with normal breathing effort HEART: regular rate & rhythm and no murmurs and no lower extremity edema Musculoskeletal: no cyanosis of digits and no clubbing  PSYCH: alert & oriented x 3, fluent speech NEURO: no focal motor/sensory deficits  LABORATORY DATA:  I have reviewed the data as listed    Latest Ref Rng & Units 03/30/2023    7:48 AM 03/02/2023    8:00 AM 02/16/2023   10:35 AM  CBC  WBC 4.0 - 10.5 K/uL 2.7  2.5  2.5   Hemoglobin 12.0 - 15.0 g/dL 16.1  09.6  04.5   Hematocrit 36.0 - 46.0 % 38.3  34.7  34.8   Platelets 150 - 400 K/uL 129  125  130        Latest Ref Rng & Units 03/02/2023    8:00 AM 02/02/2023    7:52 AM 01/26/2023    7:42 AM  CMP  Glucose 70 - 99 mg/dL 409  811  914   BUN 8 - 23 mg/dL 19  16  18    Creatinine 0.44 - 1.00 mg/dL  7.82  9.56  2.13   Sodium 135 - 145 mmol/L 142  142  143   Potassium 3.5 - 5.1 mmol/L 3.6  3.8  3.9   Chloride 98 - 111 mmol/L 109  109  110   CO2 22 - 32 mmol/L 27  25  25    Calcium 8.9 - 10.3 mg/dL 9.5  9.3  9.0   Total Protein 6.5 - 8.1 g/dL 6.6  6.4  6.3   Total Bilirubin 0.3 - 1.2 mg/dL 0.8  0.9  0.8   Alkaline Phos 38 - 126 U/L 73  72  73   AST 15 - 41 U/L 15  12  11    ALT 0 - 44 U/L 9  9  8      Lab Results  Component Value Date   MPROTEIN Not Observed 03/02/2023   MPROTEIN Not Observed 02/02/2023   MPROTEIN Not Observed 01/26/2023   Lab Results  Component Value Date   KPAFRELGTCHN 11.6 03/02/2023   KPAFRELGTCHN 15.0 02/02/2023   KPAFRELGTCHN 15.5 01/26/2023   LAMBDASER 9.4 03/02/2023   LAMBDASER 14.5 02/02/2023   LAMBDASER 13.6 01/26/2023   KAPLAMBRATIO 1.23 03/02/2023   KAPLAMBRATIO 1.03 02/02/2023   KAPLAMBRATIO 1.14 01/26/2023    RADIOGRAPHIC STUDIES: No results found.  ASSESSMENT & PLAN Carol [name] is a 67 y.o. female with medical history  significant for multiple myeloma with plasmacytoma who presents for a follow up visit.   # Free Kappa Multiple Myeloma # Plasmacytoma  -- original finding of L2 plasmacytoma on biopsy of back lesion, bone marrow biopsy confirms greater than 60% plasma cells consistent with multiple myeloma. --Cycle 1 Day 1 of VRD treatment started on 10/26/2021.  --VRD therapy stopped on 11/25/2021 due to rash. Started Dara/Rev/Dex on 12/16/2021.  --will order monthly SPEP, UPEP, SFLC and beta 2 microglobulin --weekly CBC, CMP, and LDH --Cycle 1 Day 1 of Kd started on 01/06/2022 Plan:  --Labs show continued improvement of cytopenias with white blood cell count 2.7, hemoglobin 13.2, MCV 91.8, and platelets of 129 --Continue to HOLD Revlimid until recovery. Will consider decrease of maintenance dose to 5 mg p.o. revlimid (21 days on, 7 off) once counts recover  --Most recent myeloma labs from 03/02/2023 showed no M-protein was undetectable with  normal serum free light chains. Continue monthly protein labs with SPEP and serum free light chains. --RTC in 2 weeks for labs and 4 weeks for clinic visit   #Neck pain: --Present for the last month involving more the left lateral side with decreased mobility --Due to history of lytic lesions and fracture of the cervical spine, we will obtain a CT cervical spine for further evaluation.   #Left arm pain: --Resolved with holding Revlimid.   #Leukopenia/Thrombocytopenia/Anemia--improving --Likely secondary to chemotherapy --continue to monitor. Bleeding and neutropenic precautions given.  #Thrush, resolved: --Treated with Nystatin mouthwash with improvement.   #Metallic taste/weight loss-improving:  --Encouraged to increase caloric intake and supplement with protein shakes.  #Supportive Care -- chemotherapy education complete  -- port placement complete. -- sent prescription for EMLA cream -- zofran 8mg  q8H PRN and compazine 10mg  PO q6H for nausea -- Started zometa on 02/03/2022. Will continue q 3 months.HOLD zometa in June 2023 due to upcoming spine surgery. Last dose on 07/28/2022. Will discuss with Dr. Leonides Schanz if this needs to be restarted.  -- no pain medication required at this time.   No orders of the defined types were placed in this encounter.  All questions were answered. The patient knows to call the clinic with any problems, questions or concerns.  I have spent a total of 30 minutes minutes of face-to-face and non-face-to-face time, preparing to see the patient, performing a medically appropriate examination, counseling and educating the patient, ordering medications/tests, documenting clinical information in the electronic health record, and care coordination.   Georga Kaufmann PA-C Dept of Hematology and Oncology [facility] Phone: [phone]   03/30/2023 8:22 AM

## 2023-04-01 ENCOUNTER — Other Ambulatory Visit: Payer: Self-pay

## 2023-04-02 LAB — KAPPA/LAMBDA LIGHT CHAINS
Kappa free light chain: 13.9 mg/L (ref 3.3–19.4)
Kappa, lambda light chain ratio: 1.25 (ref 0.26–1.65)
Lambda free light chains: 11.1 mg/L (ref 5.7–26.3)

## 2023-04-06 LAB — MULTIPLE MYELOMA PANEL, SERUM
Albumin SerPl Elph-Mcnc: 4.3 g/dL (ref 2.9–4.4)
Albumin/Glob SerPl: 2.2 — ABNORMAL HIGH (ref 0.7–1.7)
Alpha 1: 0.2 g/dL (ref 0.0–0.4)
Alpha2 Glob SerPl Elph-Mcnc: 0.5 g/dL (ref 0.4–1.0)
B-Globulin SerPl Elph-Mcnc: 1 g/dL (ref 0.7–1.3)
Gamma Glob SerPl Elph-Mcnc: 0.4 g/dL (ref 0.4–1.8)
Globulin, Total: 2 g/dL — ABNORMAL LOW (ref 2.2–3.9)
IgA: 80 mg/dL — ABNORMAL LOW (ref 87–352)
IgG (Immunoglobin G), Serum: 559 mg/dL — ABNORMAL LOW (ref 586–1602)
IgM (Immunoglobulin M), Srm: 30 mg/dL (ref 26–217)
Total Protein ELP: 6.3 g/dL (ref 6.0–8.5)

## 2023-04-13 ENCOUNTER — Other Ambulatory Visit: Payer: Self-pay | Admitting: Hematology and Oncology

## 2023-04-13 ENCOUNTER — Inpatient Hospital Stay: Payer: BC Managed Care – PPO

## 2023-04-13 DIAGNOSIS — Z95828 Presence of other vascular implants and grafts: Secondary | ICD-10-CM

## 2023-04-13 DIAGNOSIS — C9001 Multiple myeloma in remission: Secondary | ICD-10-CM

## 2023-04-13 DIAGNOSIS — C9000 Multiple myeloma not having achieved remission: Secondary | ICD-10-CM | POA: Diagnosis not present

## 2023-04-13 LAB — CMP (CANCER CENTER ONLY)
ALT: 10 U/L (ref 0–44)
AST: 13 U/L — ABNORMAL LOW (ref 15–41)
Albumin: 4.3 g/dL (ref 3.5–5.0)
Alkaline Phosphatase: 61 U/L (ref 38–126)
Anion gap: 7 (ref 5–15)
BUN: 23 mg/dL (ref 8–23)
CO2: 26 mmol/L (ref 22–32)
Calcium: 9.2 mg/dL (ref 8.9–10.3)
Chloride: 108 mmol/L (ref 98–111)
Creatinine: 1 mg/dL (ref 0.44–1.00)
GFR, Estimated: >60 mL/min (ref >60)
Glucose, Bld: 102 mg/dL — ABNORMAL HIGH (ref 70–99)
Potassium: 4 mmol/L (ref 3.5–5.1)
Sodium: 141 mmol/L (ref 135–145)
Total Bilirubin: 0.8 mg/dL (ref 0.3–1.2)
Total Protein: 6.2 g/dL — ABNORMAL LOW (ref 6.5–8.1)

## 2023-04-13 LAB — CBC WITH DIFFERENTIAL (CANCER CENTER ONLY)
Abs Immature Granulocytes: 0 10*3/uL (ref 0.00–0.07)
Basophils Absolute: 0 10*3/uL (ref 0.0–0.1)
Basophils Relative: 1 %
Eosinophils Absolute: 0 10*3/uL (ref 0.0–0.5)
Eosinophils Relative: 1 %
HCT: 37.2 % (ref 36.0–46.0)
Hemoglobin: 13.3 g/dL (ref 12.0–15.0)
Immature Granulocytes: 0 %
Lymphocytes Relative: 37 %
Lymphs Abs: 1 10*3/uL (ref 0.7–4.0)
MCH: 32.1 pg (ref 26.0–34.0)
MCHC: 35.8 g/dL (ref 30.0–36.0)
MCV: 89.9 fL (ref 80.0–100.0)
Monocytes Absolute: 0.3 10*3/uL (ref 0.1–1.0)
Monocytes Relative: 10 %
Neutro Abs: 1.3 10*3/uL — ABNORMAL LOW (ref 1.7–7.7)
Neutrophils Relative %: 51 %
Platelet Count: 128 10*3/uL — ABNORMAL LOW (ref 150–400)
RBC: 4.14 MIL/uL (ref 3.87–5.11)
RDW: 12.6 % (ref 11.5–15.5)
WBC Count: 2.6 10*3/uL — ABNORMAL LOW (ref 4.0–10.5)
nRBC: 0 % (ref 0.0–0.2)

## 2023-04-13 MED ORDER — HEPARIN SOD (PORK) LOCK FLUSH 100 UNIT/ML IV SOLN
500.0000 [IU] | Freq: Once | INTRAVENOUS | Status: AC
  Administered 2023-04-13: 500 [IU]

## 2023-04-13 MED ORDER — SODIUM CHLORIDE 0.9% FLUSH
10.0000 mL | Freq: Once | INTRAVENOUS | Status: AC
  Administered 2023-04-13: 10 mL

## 2023-04-19 ENCOUNTER — Ambulatory Visit (HOSPITAL_COMMUNITY)
Admission: RE | Admit: 2023-04-19 | Discharge: 2023-04-19 | Disposition: A | Discharge status: Home / Self Care | Payer: BC Managed Care – PPO | Source: Ambulatory Visit | Attending: Physician Assistant | Admitting: Physician Assistant

## 2023-04-19 DIAGNOSIS — M542 Cervicalgia: Secondary | ICD-10-CM | POA: Insufficient documentation

## 2023-04-19 MED ORDER — IOHEXOL 300 MG/ML  SOLN
75.0000 mL | Freq: Once | INTRAMUSCULAR | Status: AC | PRN
  Administered 2023-04-19: 75 mL via INTRAVENOUS

## 2023-04-27 ENCOUNTER — Inpatient Hospital Stay: Payer: BC Managed Care – PPO | Attending: Radiation Oncology

## 2023-04-27 ENCOUNTER — Inpatient Hospital Stay (HOSPITAL_BASED_OUTPATIENT_CLINIC_OR_DEPARTMENT_OTHER): Payer: BC Managed Care – PPO | Admitting: Hematology and Oncology

## 2023-04-27 ENCOUNTER — Other Ambulatory Visit: Payer: Self-pay

## 2023-04-27 VITALS — BP 127/62 | HR 86 | Temp 97.0°F | Resp 15 | Wt 183.3 lb

## 2023-04-27 DIAGNOSIS — C9000 Multiple myeloma not having achieved remission: Secondary | ICD-10-CM | POA: Diagnosis not present

## 2023-04-27 DIAGNOSIS — Z79899 Other long term (current) drug therapy: Secondary | ICD-10-CM | POA: Insufficient documentation

## 2023-04-27 DIAGNOSIS — C9001 Multiple myeloma in remission: Secondary | ICD-10-CM

## 2023-04-27 DIAGNOSIS — M542 Cervicalgia: Secondary | ICD-10-CM

## 2023-04-27 DIAGNOSIS — Z95828 Presence of other vascular implants and grafts: Secondary | ICD-10-CM

## 2023-04-27 LAB — CBC WITH DIFFERENTIAL (CANCER CENTER ONLY)
Abs Immature Granulocytes: 0.01 10*3/uL (ref 0.00–0.07)
Basophils Absolute: 0 10*3/uL (ref 0.0–0.1)
Basophils Relative: 1 %
Eosinophils Absolute: 0.1 10*3/uL (ref 0.0–0.5)
Eosinophils Relative: 2 %
HCT: 36.3 % (ref 36.0–46.0)
Hemoglobin: 13.2 g/dL (ref 12.0–15.0)
Immature Granulocytes: 0 %
Lymphocytes Relative: 31 %
Lymphs Abs: 1 10*3/uL (ref 0.7–4.0)
MCH: 33.3 pg (ref 26.0–34.0)
MCHC: 36.4 g/dL — ABNORMAL HIGH (ref 30.0–36.0)
MCV: 91.7 fL (ref 80.0–100.0)
Monocytes Absolute: 0.3 10*3/uL (ref 0.1–1.0)
Monocytes Relative: 8 %
Neutro Abs: 2 10*3/uL (ref 1.7–7.7)
Neutrophils Relative %: 58 %
Platelet Count: 148 10*3/uL — ABNORMAL LOW (ref 150–400)
RBC: 3.96 MIL/uL (ref 3.87–5.11)
RDW: 13.1 % (ref 11.5–15.5)
WBC Count: 3.4 10*3/uL — ABNORMAL LOW (ref 4.0–10.5)
nRBC: 0 % (ref 0.0–0.2)

## 2023-04-27 LAB — CMP (CANCER CENTER ONLY)
ALT: 11 U/L (ref 0–44)
AST: 14 U/L — ABNORMAL LOW (ref 15–41)
Albumin: 4.3 g/dL (ref 3.5–5.0)
Alkaline Phosphatase: 80 U/L (ref 38–126)
Anion gap: 8 (ref 5–15)
BUN: 18 mg/dL (ref 8–23)
CO2: 26 mmol/L (ref 22–32)
Calcium: 8.8 mg/dL — ABNORMAL LOW (ref 8.9–10.3)
Chloride: 108 mmol/L (ref 98–111)
Creatinine: 1.02 mg/dL — ABNORMAL HIGH (ref 0.44–1.00)
GFR, Estimated: >60 mL/min (ref >60)
Glucose, Bld: 155 mg/dL — ABNORMAL HIGH (ref 70–99)
Potassium: 3.7 mmol/L (ref 3.5–5.1)
Sodium: 142 mmol/L (ref 135–145)
Total Bilirubin: 0.6 mg/dL (ref 0.3–1.2)
Total Protein: 6.4 g/dL — ABNORMAL LOW (ref 6.5–8.1)

## 2023-04-27 LAB — LACTATE DEHYDROGENASE: LDH: 181 U/L (ref 98–192)

## 2023-04-27 MED ORDER — SODIUM CHLORIDE 0.9% FLUSH
10.0000 mL | Freq: Once | INTRAVENOUS | Status: AC
  Administered 2023-04-27: 10 mL

## 2023-04-27 MED ORDER — HEPARIN SOD (PORK) LOCK FLUSH 100 UNIT/ML IV SOLN
500.0000 [IU] | Freq: Once | INTRAVENOUS | Status: AC
  Administered 2023-04-27: 500 [IU]

## 2023-04-27 NOTE — Progress Notes (Unsigned)
[facility] [phone]   [phone]  PROGRESS NOTE  Patient Care Team: Barbarann Ehlers as PCP - General (Physician Assistant)  Hematological/Oncological History # Plasmacytoma with Multiple Myeloma  07/29/2021: CT cervical spine shows expansile lytic lesion which almost completely replaces the normal C2 vertebral body  08/24/2021: PET CT scan shows increased metabolic activity at C2 and C7  08/25/2021: biopsy of C2 lesion showed finding consistent with plasma cell neoplasm.  09/08/2021: start radiation therapy 09/12/2021: establish care with Dr. Leonides Schanz  09/28/2021: end radiation therapy 10/10/2021: Bone marrow biopsy. Results confirm plasma cell neoplasm consistent with multiple myeloma, kappa restricted.  10/26/2021: Cycle 1 Day 1 of VRD 11/18/2021: Cycle 2 Day 1 of VRD 11/25/2021: Velcade HELD due to full body rash. Resolved with steroid therapy.  12/16/2021: Cycle 1 Day 1 of Dara/Rev/Dex 12/30/2021: HELD Daratumumab due to toxicity. Patient unable to tolerate. Requested holding the medication  01/06/2022: Cycle 1 Day 1 of Kd  02/03/2022: Cycle 2 Day 1 of Kd 03/03/2022: Cycle 3 Day 1 of Kd 03/31/2022: Cycle 4 Day 1 of Kd 04/28/2022: Cycle 5 Day 1 of Kd 05/26/2022: Cycle 6 Day 1 of Kd  06/30/2022: Cycle 7 Day 1 of Kd  07/20/2022: Cycle 8 Day 1 of Kd  Sept 2023: start of maintenance revlimid 10 mg PO daily 21 of 28 day cycles.  01/05/2023: HELD Revlimid due to cytopenias.  Interval History:  Carol [name] 67 y.o. female with medical history significant for multiple myeloma with plasmacytoma who presents for a follow up visit. The patient's last visit was on 03/02/2023.  In the interim since her last visit she has held Revlimid due to cytopenias.   On exam today Mrs. Trausch reports she unfortunately has been having more neck pain.  She had a CT scan ordered which was performed last week but unfortunately has not yet been read.  She notes that it is mostly when  she is trying to move that she has discomfort.  She notes that she does not have pain anywhere else in her body.  She notes that she would like to restart the Zometa if the CT scan it looks good.  She reports other than the neck pain she feels quite well.  Her energy levels are so-so but her appetite is improving.  She notes she is able to do all of her daily activities without any difficulty.  She is not having any bone pain or back pain.  She reports that she is not having any numbness or tingling of her fingers and toes. She denies fevers, chills,  sweats, shortness of breath, chest pain or cough.She has no other complaints. Rest of the 10 point ROS is below.  MEDICAL HISTORY:  Past Medical History:  Diagnosis Date   Allergy    Anemia    Ankle edema    both ankles   Arthritis    hands,knees,elbows   Cancer (HCC) 2016   tumor kidney   Diabetes mellitus    type 2    Eczema    History of kidney stones    Hypothyroidism    Obesity    Sleep apnea    cpap machine setting of 3   Thyroid disease     SURGICAL HISTORY: Past Surgical History:  Procedure Laterality Date   BREAST BIOPSY Right 2001   CHOLECYSTECTOMY  2009   open   COLONOSCOPY     CYSTOSCOPY W/ RETROGRADES Left 11/22/2018   Procedure: CYSTOSCOPY WITH RETROGRADE  PYELOGRAM;  Surgeon: Crist Fat, MD;  Location: [facility];  Service: Urology;  Laterality: Left;   CYSTOSCOPY WITH RETROGRADE PYELOGRAM, URETEROSCOPY AND STENT PLACEMENT Right 10/03/2021   Procedure: CYSTOSCOPY WITH RETROGRADE PYELOGRAM, URETEROSCOPY,STONE EXTRACTION AND STENT PLACEMENT;  Surgeon: Rene Paci, MD;  Location: [facility];  Service: Urology;  Laterality: Right;   DILATATION & CURRETTAGE/HYSTEROSCOPY WITH RESECTOCOPE N/A 01/21/2013   Procedure: DILATATION & CURETTAGE/HYSTEROSCOPY WITH RESECTOCOPE AND RESECTION OF ENDOMETRIAL;  Surgeon: Gretta Cool, MD;  Location: [facility];  Service: Gynecology;  Laterality: N/A;    HARDWARE REMOVAL N/A 06/05/2022   Procedure: Removal of Posterior Cervical Hardware from Occiput to Cervical Five;  Surgeon: Jadene Pierini, MD;  Location: [facility];  Service: Neurosurgery;  Laterality: N/A;   IR IMAGING GUIDED PORT INSERTION  02/01/2022   KNEE ARTHROSCOPY     left    POLYPECTOMY     POSTERIOR CERVICAL FUSION/FORAMINOTOMY N/A 08/25/2021   Procedure: Occiput to Cervical 5 Posterior cervical instrumented fusion with open biopsy of Cervical 2 Mass. Cervical Two Ganglionectomy;  Surgeon: Jadene Pierini, MD;  Location: [facility];  Service: Neurosurgery;  Laterality: N/A;   ROBOT ASSISTED PYELOPLASTY Left 11/22/2018   Procedure: XI ROBOTIC ASSISTED ATTEMPTED PYELOPLASTY CONVERTED TO NEPHRECTOMY/LYSIS OF ADHESIONS/UMBILICAL HERNIA REPAIR;  Surgeon: Crist Fat, MD;  Location: [facility];  Service: Urology;  Laterality: Left;   ROBOTIC ASSITED PARTIAL NEPHRECTOMY Left 06/11/2015   Procedure: LEFT ROBOTIC ASSISTED LAPAROSCOPIC  PARTIAL NEPHRECTOMY;  Surgeon: Crist Fat, MD;  Location: [facility];  Service: Urology;  Laterality: Left;   ROTATOR CUFF REPAIR Left 2011   URETERAL BIOPSY Left 02/14/2017   Procedure: URETERAL BIOPSY;  Surgeon: Crist Fat, MD;  Location: [facility];  Service: Urology;  Laterality: Left;   URETEROSCOPY WITH HOLMIUM LASER LITHOTRIPSY Left 02/14/2017   Procedure: URETEROSCOPY LITHOTRIPSY, URETERAL BALLOON DILATION;  Surgeon: Crist Fat, MD;  Location: [facility];  Service: Urology;  Laterality: Left;  With STENT    SOCIAL HISTORY: Social History   Socioeconomic History   Marital status: Married    Spouse name: Not on file   Number of children: Not on file   Years of education: Not on file   Highest education level: Not on file  Occupational History   Not on file  Tobacco Use   Smoking status: Never   Smokeless tobacco: Never  Vaping Use   Vaping Use: Never used  Substance and Sexual Activity   Alcohol use: Yes    Comment: occ   Drug  use: No   Sexual activity: Not on file  Other Topics Concern   Not on file  Social History Narrative   Not on file   Social Determinants of Health   Financial Resource Strain: Low Risk  (11/22/2018)   Overall Financial Resource Strain (CARDIA)    Difficulty of Paying Living Expenses: Not hard at all  Food Insecurity: Unknown (11/22/2018)   Hunger Vital Sign    Worried About Running Out of Food in the Last Year: Patient declined    Ran Out of Food in the Last Year: Patient declined  Transportation Needs: Unknown (11/22/2018)   PRAPARE - Administrator, Civil Service (Medical): Patient declined    Lack of Transportation (Non-Medical): Patient declined  Physical Activity: Unknown (11/22/2018)   Exercise Vital Sign    Days of Exercise per Week: Patient declined    Minutes of Exercise per Session: Patient declined  Stress: No Stress  Concern Present (11/22/2018)   [employer] of Occupational Health - Occupational Stress Questionnaire    Feeling of Stress : Not at all  Social Connections: Unknown (11/22/2018)   Social Connection and Isolation Panel [NHANES]    Frequency of Communication with Friends and Family: Patient declined    Frequency of Social Gatherings with Friends and Family: Patient declined    Attends Religious Services: Patient declined    Database administrator or Organizations: Patient declined    Attends Banker Meetings: Patient declined    Marital Status: Patient declined  Intimate Partner Violence: Unknown (11/22/2018)   Humiliation, Afraid, Rape, and Kick questionnaire    Fear of Current or Ex-Partner: Patient declined    Emotionally Abused: Patient declined    Physically Abused: Patient declined    Sexually Abused: Patient declined    FAMILY HISTORY: Family History  Problem Relation Age of Onset   Cancer Father     ALLERGIES:  is allergic to morphine.  MEDICATIONS:  Current Outpatient Medications  Medication Sig Dispense Refill    Ascorbic Acid (VITAMIN C ADULT GUMMIES PO) Take 1 tablet by mouth daily.     atorvastatin (LIPITOR) 20 MG tablet Take 20 mg by mouth at bedtime.     Calcium Carbonate-Vitamin D 600-5 MG-MCG CAPS Take 1 capsule by mouth every morning.     Cholecalciferol (VITAMIN D3 PO) Take by mouth.     [employer] ASPIRIN LOW DOSE 81 MG tablet TAKE 1 TABLET (81 MG TOTAL) BY MOUTH DAILY. SWALLOW WHOLE. 90 tablet 3   Cyanocobalamin (VITAMIN B12) 1000 MCG TBCR Take 1 tablet by mouth every morning.     furosemide (LASIX) 20 MG tablet Take 20 mg by mouth daily.     KLOR-CON M20 20 MEQ tablet TAKE 1 TABLET BY MOUTH EVERY DAY 30 tablet 1   lenalidomide (REVLIMID) 10 MG capsule TAKE 1 CAPSULE BY MOUTH 1 TIME A DAY for 21 days [employer] Auth # [id number] Date obtained 01/04/2023 (Patient not taking: Reported on 04/27/2023) 21 capsule 0   levothyroxine (SYNTHROID) 50 MCG tablet Take 50 mcg by mouth daily before breakfast.     lidocaine-prilocaine (EMLA) cream Apply 1 Application topically as needed. 30 g 0   Magnesium Hydroxide (MILK OF MAGNESIA PO) Take 15 mLs by mouth daily as needed (constipation/diarrhea).     ondansetron (ZOFRAN) 8 MG tablet Take 1 tablet (8 mg total) by mouth every 8 (eight) hours as needed. 30 tablet 0   oxyCODONE-acetaminophen (PERCOCET) 5-325 MG tablet Take 1 tablet by mouth every 4 (four) hours as needed (pain). 30 tablet 0   OZEMPIC, 0.25 OR 0.5 MG/DOSE, 2 MG/1.5ML SOPN Inject 0.5 mg into the skin every Saturday.     prednisoLONE acetate (PRED FORTE) 1 % ophthalmic suspension Place 1 drop into the left eye 4 (four) times daily.     PROAIR HFA 108 (90 BASE) MCG/ACT inhaler Inhale 2 puffs into the lungs every 4 (four) hours as needed for wheezing or shortness of breath. Uses seasonal  3   prochlorperazine (COMPAZINE) 10 MG tablet Take 1 tablet (10 mg total) by mouth every 6 (six) hours as needed for nausea or vomiting. 30 tablet 0   PROLENSA 0.07 % SOLN Place 1 drop into the right eye at bedtime.     Sennosides  (SENOKOT PO) Take 1 tablet by mouth daily as needed (constipation). Bid     No current facility-administered medications for this visit.    REVIEW OF SYSTEMS:  Constitutional: ( - ) fevers, ( - )  chills , ( - ) night sweats Eyes: ( - ) blurriness of vision, ( - ) double vision, ( - ) watery eyes Ears, nose, mouth, throat, and face: ( - ) mucositis, ( - ) sore throat Respiratory: ( - ) cough, ( - ) dyspnea, ( - ) wheezes Cardiovascular: ( - ) palpitation, ( - ) chest discomfort, ( - ) lower extremity swelling Gastrointestinal:  ( - ) nausea, ( - ) heartburn, ( -) change in bowel habits Skin: ( - ) abnormal skin rashes Lymphatics: ( - ) new lymphadenopathy, ( - ) easy bruising Neurological: ( - ) numbness, ( - ) tingling, ( - ) new weaknesses Behavioral/Psych: ( - ) mood change, ( - ) new changes  All other systems were reviewed with the patient and are negative.  PHYSICAL EXAMINATION: ECOG PERFORMANCE STATUS: 1 - Symptomatic but completely ambulatory  Vitals:   04/27/23 1503  BP: 127/62  Pulse: 86  Resp: 15  Temp: (!) 97 F (36.1 C)  SpO2: 100%         Filed Weights   04/27/23 1503  Weight: 183 lb 4.8 oz (83.1 kg)        GENERAL: Well-appearing middle-age Caucasian female alert, no distress and comfortable SKIN: No evidence of rash, erythema, dry skin, or raised lesions. EYES: conjunctiva are pink and non-injected, sclera clear LUNGS: clear to auscultation and percussion with normal breathing effort HEART: regular rate & rhythm and no murmurs and no lower extremity edema Musculoskeletal: no cyanosis of digits and no clubbing  PSYCH: alert & oriented x 3, fluent speech NEURO: no focal motor/sensory deficits  LABORATORY DATA:  I have reviewed the data as listed    Latest Ref Rng & Units 04/27/2023    2:42 PM 04/13/2023    7:38 AM 03/30/2023    7:48 AM  CBC  WBC 4.0 - 10.5 K/uL 3.4  2.6  2.7   Hemoglobin 12.0 - 15.0 g/dL 16.1  09.6  04.5   Hematocrit 36.0 -  46.0 % 36.3  37.2  38.3   Platelets 150 - 400 K/uL 148  128  129        Latest Ref Rng & Units 04/27/2023    2:42 PM 04/13/2023    7:38 AM 03/30/2023    7:48 AM  CMP  Glucose 70 - 99 mg/dL 409  811  914   BUN 8 - 23 mg/dL 18  23  25    Creatinine 0.44 - 1.00 mg/dL 7.82  9.56  2.13   Sodium 135 - 145 mmol/L 142  141  141   Potassium 3.5 - 5.1 mmol/L 3.7  4.0  4.2   Chloride 98 - 111 mmol/L 108  108  108   CO2 22 - 32 mmol/L 26  26  26    Calcium 8.9 - 10.3 mg/dL 8.8  9.2  9.2   Total Protein 6.5 - 8.1 g/dL 6.4  6.2  6.7   Total Bilirubin 0.3 - 1.2 mg/dL 0.6  0.8  0.9   Alkaline Phos 38 - 126 U/L 80  61  76   AST 15 - 41 U/L 14  13  13    ALT 0 - 44 U/L 11  10  11      Lab Results  Component Value Date   MPROTEIN Not Observed 03/30/2023   MPROTEIN Not Observed 03/02/2023   MPROTEIN Not Observed 02/02/2023   Lab Results  Component Value  Date   KPAFRELGTCHN 13.9 03/30/2023   KPAFRELGTCHN 11.6 03/02/2023   KPAFRELGTCHN 15.0 02/02/2023   LAMBDASER 11.1 03/30/2023   LAMBDASER 9.4 03/02/2023   LAMBDASER 14.5 02/02/2023   KAPLAMBRATIO 1.25 03/30/2023   KAPLAMBRATIO 1.23 03/02/2023   KAPLAMBRATIO 1.03 02/02/2023    RADIOGRAPHIC STUDIES: CT CERVICAL SPINE W CONTRAST  Result Date: 04/27/2023 CLINICAL DATA:  Neck pain, history of multiple myeloma. EXAM: CT CERVICAL SPINE WITH CONTRAST TECHNIQUE: Multidetector CT imaging of the cervical spine was performed during intravenous contrast administration. Multiplanar CT image reconstructions were also generated. RADIATION DOSE REDUCTION: This exam was performed according to the departmental dose-optimization program which includes automated exposure control, adjustment of the mA and/or kV according to patient size and/or use of iterative reconstruction technique. CONTRAST:  75mL OMNIPAQUE IOHEXOL 300 MG/ML  SOLN COMPARISON:  Cervical spine CT 03/21/2022. FINDINGS: Alignment: Normal. Skull base and vertebrae: The large lytic lesion in the C2  vertebral body extending to the dens and bilateral lateral masses/pedicles, right more than left, is unchanged. There is unchanged erosion of the cortex along the inferior and posterior endplates. There is no evidence of new pathologic fracture. There is no significant soft tissue component by CT. The smaller lucent lesion in the superior aspect of the C3 vertebral body and 0.7 cm lesion in the T2 vertebral body are unchanged. No new lesions are identified. Previously seen fusion hardware extending from the occiput through C5 has been removed. Bone graft material about the pedicles remains. There is fusion across the posterior elements bilaterally at C2-C3 and C3-C4 and on the right at C4-C5. Skull base alignment is maintained. There is no evidence of new acute fracture. Soft tissues and spinal canal: No prevertebral fluid or swelling. No visible canal hematoma. Disc levels: Disc space narrowing and degenerative endplate change is again most advanced at C5-C6 and C6-C7. There is no evidence of high-grade spinal canal stenosis. Upper chest: The 9 mm nodule in the left apex is unchanged, better evaluated on prior PET-CT from 2002. Other: There is no abnormal enhancement. A right chest wall port is partially imaged. IMPRESSION: 1. Interval removal of cervical fusion hardware extending from the occiput through C5. Bone graft remains. 2. Unchanged large lytic lesion in the C2 vertebral body and additional lesions in the C3 and T2 vertebral bodies. No new lesion. 3. No new acute fracture. Electronically Signed   By: Lesia Hausen M.D.   On: 04/27/2023 15:56    ASSESSMENT & PLAN Carol SEMINARA is a 67 y.o. female with medical history significant for multiple myeloma with plasmacytoma who presents for a follow up visit.   # Free Kappa Multiple Myeloma # Plasmacytoma  -- original finding of L2 plasmacytoma on biopsy of back lesion, bone marrow biopsy confirms greater than 60% plasma cells consistent with multiple  myeloma. --Cycle 1 Day 1 of VRD treatment started on 10/26/2021.  --VRD therapy stopped on 11/25/2021 due to rash. Started Dara/Rev/Dex on 12/16/2021.  --will order monthly SPEP, UPEP, SFLC and beta 2 microglobulin --weekly CBC, CMP, and LDH --Cycle 1 Day 1 of Kd started on 01/06/2022 Plan:  --Labs show continued improvement of cytopenias with white blood cell count 3.4, hemoglobin 13.2, MCV 91.7, and platelets of 148 --Continue to HOLD Revlimid until recovery. Will consider decrease of maintenance dose to 5 mg p.o. revlimid (21 days on, 7 off) once counts recover  --Most recent myeloma labs from 03/30/2023 showed no M-protein was undetectable with normal serum free light chains. Continue monthly protein labs  with SPEP and serum free light chains. --RTC in 2 weeks for labs and 4 weeks for clinic visit   #Neck pain: --Present for the last month involving more the left lateral side with decreased mobility --Due to history of lytic lesions and fracture of the cervical spine, we will obtain a CT cervical spine for further evaluation.   #Left arm pain: --Resolved with holding Revlimid.   #Leukopenia/Thrombocytopenia/Anemia--improving --Likely secondary to chemotherapy --continue to monitor. Bleeding and neutropenic precautions given.  #Thrush, resolved: --Treated with Nystatin mouthwash with improvement.   #Metallic taste/weight loss-improving:  --Encouraged to increase caloric intake and supplement with protein shakes.  #Supportive Care -- chemotherapy education complete  -- port placement complete. -- sent prescription for EMLA cream -- zofran 8mg  q8H PRN and compazine 10mg  PO q6H for nausea -- Started zometa on 02/03/2022. Will continue q 3 months.HOLD zometa in June 2023 due to upcoming spine surgery. Last dose on 07/28/2022. Plan to restart if no further procedures are needed based on imagining from 04/19/2023.  -- no pain medication required at this time.   No orders of the defined types  were placed in this encounter.  All questions were answered. The patient knows to call the clinic with any problems, questions or concerns.  I have spent a total of 30 minutes minutes of face-to-face and non-face-to-face time, preparing to see the patient, performing a medically appropriate examination, counseling and educating the patient, ordering medications/tests, documenting clinical information in the electronic health record, and care coordination.   Ulysees Barns, MD Department of Hematology/Oncology [facility] Phone: [phone] Pager: [phone] Email: [email or web address]  04/29/2023 6:39 PM

## 2023-04-28 ENCOUNTER — Other Ambulatory Visit: Payer: Self-pay

## 2023-04-29 ENCOUNTER — Encounter: Payer: Self-pay | Admitting: Hematology and Oncology

## 2023-04-30 ENCOUNTER — Telehealth: Payer: Self-pay | Admitting: *Deleted

## 2023-04-30 LAB — KAPPA/LAMBDA LIGHT CHAINS
Kappa free light chain: 10.9 mg/L (ref 3.3–19.4)
Kappa, lambda light chain ratio: 1.16 (ref 0.26–1.65)
Lambda free light chains: 9.4 mg/L (ref 5.7–26.3)

## 2023-04-30 NOTE — Telephone Encounter (Signed)
-----   Message from Briant Cedar, PA-C sent at 04/30/2023 10:29 AM EDT ----- Dr. Shelda Altes: Can you notify patient that CT didn't show any new bone lesions.

## 2023-04-30 NOTE — Telephone Encounter (Signed)
TCT patient regarding scan results of cervical spine.No answer but was able to leave vm message on her identified phone vm. Advised that the scan did not show any new lesions in her cervical spine. Advised to let her neurosurgeon know this. Advised to call back with any questions or concerns.

## 2023-05-04 LAB — MULTIPLE MYELOMA PANEL, SERUM
Albumin SerPl Elph-Mcnc: 3.7 g/dL (ref 2.9–4.4)
Albumin/Glob SerPl: 1.8 — ABNORMAL HIGH (ref 0.7–1.7)
Alpha 1: 0.2 g/dL (ref 0.0–0.4)
Alpha2 Glob SerPl Elph-Mcnc: 0.5 g/dL (ref 0.4–1.0)
B-Globulin SerPl Elph-Mcnc: 0.9 g/dL (ref 0.7–1.3)
Gamma Glob SerPl Elph-Mcnc: 0.4 g/dL (ref 0.4–1.8)
Globulin, Total: 2.1 g/dL — ABNORMAL LOW (ref 2.2–3.9)
IgA: 77 mg/dL — ABNORMAL LOW (ref 87–352)
IgG (Immunoglobin G), Serum: 529 mg/dL — ABNORMAL LOW (ref 586–1602)
IgM (Immunoglobulin M), Srm: 29 mg/dL (ref 26–217)
Total Protein ELP: 5.8 g/dL — ABNORMAL LOW (ref 6.0–8.5)

## 2023-06-07 ENCOUNTER — Other Ambulatory Visit: Payer: Self-pay

## 2023-06-07 ENCOUNTER — Other Ambulatory Visit: Payer: Self-pay | Admitting: *Deleted

## 2023-06-07 ENCOUNTER — Inpatient Hospital Stay: Payer: BC Managed Care – PPO | Attending: Radiation Oncology

## 2023-06-07 ENCOUNTER — Inpatient Hospital Stay (HOSPITAL_BASED_OUTPATIENT_CLINIC_OR_DEPARTMENT_OTHER): Payer: BC Managed Care – PPO | Admitting: Hematology and Oncology

## 2023-06-07 VITALS — BP 134/72 | HR 76 | Temp 98.1°F | Resp 16 | Wt 191.8 lb

## 2023-06-07 DIAGNOSIS — Z7982 Long term (current) use of aspirin: Secondary | ICD-10-CM | POA: Diagnosis not present

## 2023-06-07 DIAGNOSIS — Z7961 Long term (current) use of immunomodulator: Secondary | ICD-10-CM | POA: Diagnosis not present

## 2023-06-07 DIAGNOSIS — C9000 Multiple myeloma not having achieved remission: Secondary | ICD-10-CM | POA: Insufficient documentation

## 2023-06-07 DIAGNOSIS — Z95828 Presence of other vascular implants and grafts: Secondary | ICD-10-CM

## 2023-06-07 DIAGNOSIS — Z79899 Other long term (current) drug therapy: Secondary | ICD-10-CM | POA: Insufficient documentation

## 2023-06-07 DIAGNOSIS — C9001 Multiple myeloma in remission: Secondary | ICD-10-CM | POA: Diagnosis not present

## 2023-06-07 LAB — CMP (CANCER CENTER ONLY)
ALT: 11 U/L (ref 0–44)
AST: 13 U/L — ABNORMAL LOW (ref 15–41)
Albumin: 4.2 g/dL (ref 3.5–5.0)
Alkaline Phosphatase: 74 U/L (ref 38–126)
Anion gap: 7 (ref 5–15)
BUN: 18 mg/dL (ref 8–23)
CO2: 24 mmol/L (ref 22–32)
Calcium: 9.1 mg/dL (ref 8.9–10.3)
Chloride: 111 mmol/L (ref 98–111)
Creatinine: 0.91 mg/dL (ref 0.44–1.00)
GFR, Estimated: >60 mL/min (ref >60)
Glucose, Bld: 101 mg/dL — ABNORMAL HIGH (ref 70–99)
Potassium: 4.2 mmol/L (ref 3.5–5.1)
Sodium: 142 mmol/L (ref 135–145)
Total Bilirubin: 0.7 mg/dL (ref 0.3–1.2)
Total Protein: 6.3 g/dL — ABNORMAL LOW (ref 6.5–8.1)

## 2023-06-07 LAB — CBC WITH DIFFERENTIAL (CANCER CENTER ONLY)
Abs Immature Granulocytes: 0.01 10*3/uL (ref 0.00–0.07)
Basophils Absolute: 0 10*3/uL (ref 0.0–0.1)
Basophils Relative: 1 %
Eosinophils Absolute: 0.1 10*3/uL (ref 0.0–0.5)
Eosinophils Relative: 2 %
HCT: 38.4 % (ref 36.0–46.0)
Hemoglobin: 13.9 g/dL (ref 12.0–15.0)
Immature Granulocytes: 0 %
Lymphocytes Relative: 25 %
Lymphs Abs: 0.9 10*3/uL (ref 0.7–4.0)
MCH: 32 pg (ref 26.0–34.0)
MCHC: 36.2 g/dL — ABNORMAL HIGH (ref 30.0–36.0)
MCV: 88.3 fL (ref 80.0–100.0)
Monocytes Absolute: 0.3 10*3/uL (ref 0.1–1.0)
Monocytes Relative: 8 %
Neutro Abs: 2.3 10*3/uL (ref 1.7–7.7)
Neutrophils Relative %: 64 %
Platelet Count: 125 10*3/uL — ABNORMAL LOW (ref 150–400)
RBC: 4.35 MIL/uL (ref 3.87–5.11)
RDW: 12.6 % (ref 11.5–15.5)
WBC Count: 3.6 10*3/uL — ABNORMAL LOW (ref 4.0–10.5)
nRBC: 0 % (ref 0.0–0.2)

## 2023-06-07 LAB — LACTATE DEHYDROGENASE: LDH: 170 U/L (ref 98–192)

## 2023-06-07 MED ORDER — HEPARIN SOD (PORK) LOCK FLUSH 100 UNIT/ML IV SOLN
500.0000 [IU] | Freq: Once | INTRAVENOUS | Status: AC | PRN
  Administered 2023-06-07: 500 [IU]

## 2023-06-07 MED ORDER — SODIUM CHLORIDE 0.9% FLUSH
10.0000 mL | Freq: Once | INTRAVENOUS | Status: AC | PRN
  Administered 2023-06-07: 10 mL

## 2023-06-07 MED ORDER — LENALIDOMIDE 5 MG PO CAPS: 5.0000 mg | ORAL_CAPSULE | Freq: Every day | ORAL | 0 refills | Status: DC

## 2023-06-07 NOTE — Progress Notes (Signed)
[facility] [phone]   [phone]  PROGRESS NOTE  Patient Care Team: Barbarann Ehlers as PCP - General (Physician Assistant)  Hematological/Oncological History # Plasmacytoma with Multiple Myeloma  07/29/2021: CT cervical spine shows expansile lytic lesion which almost completely replaces the normal C2 vertebral body  08/24/2021: PET CT scan shows increased metabolic activity at C2 and C7  08/25/2021: biopsy of C2 lesion showed finding consistent with plasma cell neoplasm.  09/08/2021: start radiation therapy 09/12/2021: establish care with Dr. Leonides Schanz  09/28/2021: end radiation therapy 10/10/2021: Bone marrow biopsy. Results confirm plasma cell neoplasm consistent with multiple myeloma, kappa restricted.  10/26/2021: Cycle 1 Day 1 of VRD 11/18/2021: Cycle 2 Day 1 of VRD 11/25/2021: Velcade HELD due to full body rash. Resolved with steroid therapy.  12/16/2021: Cycle 1 Day 1 of Dara/Rev/Dex 12/30/2021: HELD Daratumumab due to toxicity. Patient unable to tolerate. Requested holding the medication  01/06/2022: Cycle 1 Day 1 of Kd  02/03/2022: Cycle 2 Day 1 of Kd 03/03/2022: Cycle 3 Day 1 of Kd 03/31/2022: Cycle 4 Day 1 of Kd 04/28/2022: Cycle 5 Day 1 of Kd 05/26/2022: Cycle 6 Day 1 of Kd  06/30/2022: Cycle 7 Day 1 of Kd  07/20/2022: Cycle 8 Day 1 of Kd  Sept 2023: start of maintenance revlimid 10 mg PO daily 21 of 28 day cycles.  01/05/2023: HELD Revlimid due to cytopenias. 06/07/2023: restart maintenance revlimid 5 mg PO daily 21 days of 28 days.   Interval History:  MACEL YEARSLEY 67 y.o. female with medical history significant for multiple myeloma with plasmacytoma who presents for a follow up visit. The patient's last visit was on 04/27/2023.  In the interim since her last visit she has continued to hold Revlimid due to cytopenias.   On exam today Mrs. Bowron reports she spent the Fourth of July holiday redoing her kitchen and standing her cabinets.  She  reports that she goes to bed early around 7:30 PM but does not sleep well and tends to wake up around 5 AM.  She notes that she only sleeps about 3 to 4 hours straight per night and has multiple wake ups but she is unsure why.  She notes that she is not sleepy during the day.  She notes that she is interested in a sleep supplement such as melatonin.  She reports she is having some more spasms in the neck and thinks it is return of her feeling in that area.  Her weight is increasing, up to 191 pounds from 179 pounds.  She is interested in starting a new diet to limit carbohydrates and improve her weight.  She denies fevers, chills,  sweats, shortness of breath, chest pain or cough.She has no other complaints. Rest of the 10 point ROS is below.  Patient today reports that she would like to restart her Revlimid therapy at a lower dose and is agreeable to restarting Zometa therapy as well.  We will have these arranged to start a soon as is feasible.  MEDICAL HISTORY:  Past Medical History:  Diagnosis Date   Allergy    Anemia    Ankle edema    both ankles   Arthritis    hands,knees,elbows   Cancer (HCC) 2016   tumor kidney   Diabetes mellitus    type 2    Eczema    History of kidney stones    Hypothyroidism    Obesity    Sleep apnea    cpap  machine setting of 3   Thyroid disease     SURGICAL HISTORY: Past Surgical History:  Procedure Laterality Date   BREAST BIOPSY Right 2001   CHOLECYSTECTOMY  2009   open   COLONOSCOPY     CYSTOSCOPY W/ RETROGRADES Left 11/22/2018   Procedure: CYSTOSCOPY WITH RETROGRADE PYELOGRAM;  Surgeon: Crist Fat, MD;  Location: [facility];  Service: Urology;  Laterality: Left;   CYSTOSCOPY WITH RETROGRADE PYELOGRAM, URETEROSCOPY AND STENT PLACEMENT Right 10/03/2021   Procedure: CYSTOSCOPY WITH RETROGRADE PYELOGRAM, URETEROSCOPY,STONE EXTRACTION AND STENT PLACEMENT;  Surgeon: Rene Paci, MD;  Location: [facility];  Service: Urology;  Laterality:  Right;   DILATATION & CURRETTAGE/HYSTEROSCOPY WITH RESECTOCOPE N/A 01/21/2013   Procedure: DILATATION & CURETTAGE/HYSTEROSCOPY WITH RESECTOCOPE AND RESECTION OF ENDOMETRIAL;  Surgeon: Gretta Cool, MD;  Location: [facility];  Service: Gynecology;  Laterality: N/A;   HARDWARE REMOVAL N/A 06/05/2022   Procedure: Removal of Posterior Cervical Hardware from Occiput to Cervical Five;  Surgeon: Jadene Pierini, MD;  Location: [facility];  Service: Neurosurgery;  Laterality: N/A;   IR IMAGING GUIDED PORT INSERTION  02/01/2022   KNEE ARTHROSCOPY     left    POLYPECTOMY     POSTERIOR CERVICAL FUSION/FORAMINOTOMY N/A 08/25/2021   Procedure: Occiput to Cervical 5 Posterior cervical instrumented fusion with open biopsy of Cervical 2 Mass. Cervical Two Ganglionectomy;  Surgeon: Jadene Pierini, MD;  Location: [facility];  Service: Neurosurgery;  Laterality: N/A;   ROBOT ASSISTED PYELOPLASTY Left 11/22/2018   Procedure: XI ROBOTIC ASSISTED ATTEMPTED PYELOPLASTY CONVERTED TO NEPHRECTOMY/LYSIS OF ADHESIONS/UMBILICAL HERNIA REPAIR;  Surgeon: Crist Fat, MD;  Location: [facility];  Service: Urology;  Laterality: Left;   ROBOTIC ASSITED PARTIAL NEPHRECTOMY Left 06/11/2015   Procedure: LEFT ROBOTIC ASSISTED LAPAROSCOPIC  PARTIAL NEPHRECTOMY;  Surgeon: Crist Fat, MD;  Location: [facility];  Service: Urology;  Laterality: Left;   ROTATOR CUFF REPAIR Left 2011   URETERAL BIOPSY Left 02/14/2017   Procedure: URETERAL BIOPSY;  Surgeon: Crist Fat, MD;  Location: [facility];  Service: Urology;  Laterality: Left;   URETEROSCOPY WITH HOLMIUM LASER LITHOTRIPSY Left 02/14/2017   Procedure: URETEROSCOPY LITHOTRIPSY, URETERAL BALLOON DILATION;  Surgeon: Crist Fat, MD;  Location: [facility];  Service: Urology;  Laterality: Left;  With STENT    SOCIAL HISTORY: Social History   Socioeconomic History   Marital status: Married    Spouse name: Not on file   Number of children: Not on file   Years of  education: Not on file   Highest education level: Not on file  Occupational History   Not on file  Tobacco Use   Smoking status: Never   Smokeless tobacco: Never  Vaping Use   Vaping status: Never Used  Substance and Sexual Activity   Alcohol use: Yes    Comment: occ   Drug use: No   Sexual activity: Not on file  Other Topics Concern   Not on file  Social History Narrative   Not on file   Social Determinants of Health   Financial Resource Strain: Low Risk  (01/04/2023)   Received from [facility], [facility]   Overall Financial Resource Strain (CARDIA)    Difficulty of Paying Living Expenses: Not hard at all  Food Insecurity: No Food Insecurity (01/04/2023)   Received from [facility], [facility]   Hunger Vital Sign    Worried About Running Out of Food in the Last Year: Never true    Ran  Out of Food in the Last Year: Never true  Transportation Needs: No Transportation Needs (01/04/2023)   Received from [facility], [facility]   [facility] - Transportation    Lack of Transportation (Medical): No    Lack of Transportation (Non-Medical): No  Physical Activity: Sufficiently Active (08/07/2022)   Received from [facility], [facility]   Exercise Vital Sign    Days of Exercise per Week: 3 days    Minutes of Exercise per Session: 60 min  Stress: No Stress Concern Present (10/18/2022)   Received from [facility] of Occupational Health - Occupational Stress Questionnaire    Feeling of Stress : Only a little  Recent Concern: Stress - Stress Concern Present (08/07/2022)   Received from [facility] of Occupational Health - Occupational Stress Questionnaire    Feeling of Stress : To some extent  Social Connections: Socially Integrated (08/07/2022)   Received from [facility], [facility]   Social Network    How would you rate your social network (family, work, friends)?: Good participation with social  networks  Intimate Partner Violence: Not At Risk (08/07/2022)   Received from [facility], [facility]   HITS    Over the last 12 months how often did your partner physically hurt you?: 1    Over the last 12 months how often did your partner insult you or talk down to you?: 1    Over the last 12 months how often did your partner threaten you with physical harm?: 1    Over the last 12 months how often did your partner scream or curse at you?: 1    FAMILY HISTORY: Family History  Problem Relation Age of Onset   Cancer Father     ALLERGIES:  is allergic to morphine.  MEDICATIONS:  Current Outpatient Medications  Medication Sig Dispense Refill   Ascorbic Acid (VITAMIN C ADULT GUMMIES PO) Take 1 tablet by mouth daily.     atorvastatin (LIPITOR) 20 MG tablet Take 20 mg by mouth at bedtime.     Calcium Carbonate-Vitamin D 600-5 MG-MCG CAPS Take 1 capsule by mouth every morning.     Cholecalciferol (VITAMIN D3 PO) Take by mouth.     [facility] ASPIRIN LOW DOSE 81 MG tablet TAKE 1 TABLET (81 MG TOTAL) BY MOUTH DAILY. SWALLOW WHOLE. 90 tablet 3   Cyanocobalamin (VITAMIN B12) 1000 MCG TBCR Take 1 tablet by mouth every morning.     furosemide (LASIX) 20 MG tablet Take 20 mg by mouth daily.     KLOR-CON M20 20 MEQ tablet TAKE 1 TABLET BY MOUTH EVERY DAY 30 tablet 1   lenalidomide (REVLIMID) 10 MG capsule TAKE 1 CAPSULE BY MOUTH 1 TIME A DAY for 21 days [employer] Auth # [id number] Date obtained 01/04/2023 (Patient not taking: Reported on 04/27/2023) 21 capsule 0   levothyroxine (SYNTHROID) 50 MCG tablet Take 50 mcg by mouth daily before breakfast.     lidocaine-prilocaine (EMLA) cream Apply 1 Application topically as needed. 30 g 0   Magnesium Hydroxide (MILK OF MAGNESIA PO) Take 15 mLs by mouth daily as needed (constipation/diarrhea).     ondansetron (ZOFRAN) 8 MG tablet Take 1 tablet (8 mg total) by mouth every 8 (eight) hours as needed. 30 tablet 0   OZEMPIC, 0.25 OR 0.5 MG/DOSE, 2 MG/1.5ML SOPN Inject  0.5 mg into the skin every Saturday.     prednisoLONE acetate (PRED FORTE) 1 % ophthalmic suspension Place  1 drop into the left eye 4 (four) times daily.     PROAIR HFA 108 (90 BASE) MCG/ACT inhaler Inhale 2 puffs into the lungs every 4 (four) hours as needed for wheezing or shortness of breath. Uses seasonal  3   prochlorperazine (COMPAZINE) 10 MG tablet Take 1 tablet (10 mg total) by mouth every 6 (six) hours as needed for nausea or vomiting. 30 tablet 0   PROLENSA 0.07 % SOLN Place 1 drop into the right eye at bedtime.     Sennosides (SENOKOT PO) Take 1 tablet by mouth daily as needed (constipation). Bid     No current facility-administered medications for this visit.    REVIEW OF SYSTEMS:   Constitutional: ( - ) fevers, ( - )  chills , ( - ) night sweats Eyes: ( - ) blurriness of vision, ( - ) double vision, ( - ) watery eyes Ears, nose, mouth, throat, and face: ( - ) mucositis, ( - ) sore throat Respiratory: ( - ) cough, ( - ) dyspnea, ( - ) wheezes Cardiovascular: ( - ) palpitation, ( - ) chest discomfort, ( - ) lower extremity swelling Gastrointestinal:  ( - ) nausea, ( - ) heartburn, ( -) change in bowel habits Skin: ( - ) abnormal skin rashes Lymphatics: ( - ) new lymphadenopathy, ( - ) easy bruising Neurological: ( - ) numbness, ( - ) tingling, ( - ) new weaknesses Behavioral/Psych: ( - ) mood change, ( - ) new changes  All other systems were reviewed with the patient and are negative.  PHYSICAL EXAMINATION: ECOG PERFORMANCE STATUS: 1 - Symptomatic but completely ambulatory  Vitals:   06/07/23 1009  BP: 134/72  Pulse: 76  Resp: 16  Temp: 98.1 F (36.7 C)  SpO2: 100%   Filed Weights   06/07/23 1009  Weight: 191 lb 12.8 oz (87 kg)    GENERAL: Well-appearing middle-age Caucasian female alert, no distress and comfortable SKIN: No evidence of rash, erythema, dry skin, or raised lesions. EYES: conjunctiva are pink and non-injected, sclera clear LUNGS: clear to  auscultation and percussion with normal breathing effort HEART: regular rate & rhythm and no murmurs and no lower extremity edema Musculoskeletal: no cyanosis of digits and no clubbing  PSYCH: alert & oriented x 3, fluent speech NEURO: no focal motor/sensory deficits  LABORATORY DATA:  I have reviewed the data as listed    Latest Ref Rng & Units 06/07/2023    9:32 AM 04/27/2023    2:42 PM 04/13/2023    7:38 AM  CBC  WBC 4.0 - 10.5 K/uL 3.6  3.4  2.6   Hemoglobin 12.0 - 15.0 g/dL 29.5  62.1  30.8   Hematocrit 36.0 - 46.0 % 38.4  36.3  37.2   Platelets 150 - 400 K/uL 125  148  128        Latest Ref Rng & Units 06/07/2023    9:32 AM 04/27/2023    2:42 PM 04/13/2023    7:38 AM  CMP  Glucose 70 - 99 mg/dL 657  846  962   BUN 8 - 23 mg/dL 18  18  23    Creatinine 0.44 - 1.00 mg/dL 9.52  8.41  3.24   Sodium 135 - 145 mmol/L 142  142  141   Potassium 3.5 - 5.1 mmol/L 4.2  3.7  4.0   Chloride 98 - 111 mmol/L 111  108  108   CO2 22 - 32 mmol/L 24  26  26   Calcium 8.9 - 10.3 mg/dL 9.1  8.8  9.2   Total Protein 6.5 - 8.1 g/dL 6.3  6.4  6.2   Total Bilirubin 0.3 - 1.2 mg/dL 0.7  0.6  0.8   Alkaline Phos 38 - 126 U/L 74  80  61   AST 15 - 41 U/L 13  14  13    ALT 0 - 44 U/L 11  11  10      Lab Results  Component Value Date   MPROTEIN Not Observed 04/27/2023   MPROTEIN Not Observed 03/30/2023   MPROTEIN Not Observed 03/02/2023   Lab Results  Component Value Date   KPAFRELGTCHN 10.9 04/27/2023   KPAFRELGTCHN 13.9 03/30/2023   KPAFRELGTCHN 11.6 03/02/2023   LAMBDASER 9.4 04/27/2023   LAMBDASER 11.1 03/30/2023   LAMBDASER 9.4 03/02/2023   KAPLAMBRATIO 1.16 04/27/2023   KAPLAMBRATIO 1.25 03/30/2023   KAPLAMBRATIO 1.23 03/02/2023    RADIOGRAPHIC STUDIES: No results found.  ASSESSMENT & PLAN MARSIA CINO is a 67 y.o. female with medical history significant for multiple myeloma with plasmacytoma who presents for a follow up visit.   # Free Kappa Multiple Myeloma # Plasmacytoma   -- original finding of L2 plasmacytoma on biopsy of back lesion, bone marrow biopsy confirms greater than 60% plasma cells consistent with multiple myeloma. --Cycle 1 Day 1 of VRD treatment started on 10/26/2021.  --VRD therapy stopped on 11/25/2021 due to rash. Started Dara/Rev/Dex on 12/16/2021.  --will order monthly SPEP, UPEP, SFLC and beta 2 microglobulin --weekly CBC, CMP, and LDH --Cycle 1 Day 1 of Kd started on 01/06/2022 Plan:  --Labs show continued improvement of cytopenias with white blood cell count 3.6, hemoglobin 13.9, MCV 88.3, and platelets of 125. Cr 0.91 with normal LFTs --will plan to start a lower maintenance dose of 5 mg p.o. revlimid (21 days on, 7 off)  --Most recent myeloma labs from 04/27/2023 showed no M-protein was undetectable with normal serum free light chains. Continue monthly protein labs with SPEP and serum free light chains. --RTC in 4 weeks for clinic visit/labs  #Neck pain: --Present for the last month involving more the left lateral side with decreased mobility --Due to history of lytic lesions and fracture of the cervical spine, we will obtain a CT cervical spine for further evaluation.   #Left arm pain: --Resolved with holding Revlimid.   #Leukopenia/Thrombocytopenia/Anemia--improving --Likely secondary to chemotherapy --continue to monitor. Bleeding and neutropenic precautions given.  #Thrush, resolved: --Treated with Nystatin mouthwash with improvement.   #Metallic taste/weight loss-improving:  --Encouraged to increase caloric intake and supplement with protein shakes.  #Supportive Care -- chemotherapy education complete  -- port placement complete. -- sent prescription for EMLA cream -- zofran 8mg  q8H PRN and compazine 10mg  PO q6H for nausea -- Started zometa on 02/03/2022. Will continue q 3 months.HOLD zometa in June 2023 due to upcoming spine surgery. Last dose on 07/28/2022. Plan to restart at next visit (August 2024) -- no pain medication  required at this time.   No orders of the defined types were placed in this encounter.  All questions were answered. The patient knows to call the clinic with any problems, questions or concerns.  I have spent a total of 30 minutes minutes of face-to-face and non-face-to-face time, preparing to see the patient, performing a medically appropriate examination, counseling and educating the patient, ordering medications/tests, documenting clinical information in the electronic health record, and care coordination.   Ulysees Barns, MD Department of Hematology/Oncology [facility]  [facility] Phone: [phone] Pager: [phone] Email: [email or web address]  06/07/2023 11:13 AM

## 2023-06-08 LAB — KAPPA/LAMBDA LIGHT CHAINS
Kappa free light chain: 10.4 mg/L (ref 3.3–19.4)
Kappa, lambda light chain ratio: 1.01 (ref 0.26–1.65)
Lambda free light chains: 10.3 mg/L (ref 5.7–26.3)

## 2023-06-11 LAB — MULTIPLE MYELOMA PANEL, SERUM
Albumin SerPl Elph-Mcnc: 3.7 g/dL (ref 2.9–4.4)
Albumin/Glob SerPl: 1.7 (ref 0.7–1.7)
Alpha 1: 0.2 g/dL (ref 0.0–0.4)
Alpha2 Glob SerPl Elph-Mcnc: 0.6 g/dL (ref 0.4–1.0)
B-Globulin SerPl Elph-Mcnc: 1 g/dL (ref 0.7–1.3)
Gamma Glob SerPl Elph-Mcnc: 0.4 g/dL (ref 0.4–1.8)
Globulin, Total: 2.2 g/dL (ref 2.2–3.9)
IgA: 72 mg/dL — ABNORMAL LOW (ref 87–352)
IgG (Immunoglobin G), Serum: 493 mg/dL — ABNORMAL LOW (ref 586–1602)
IgM (Immunoglobulin M), Srm: 38 mg/dL (ref 26–217)
Total Protein ELP: 5.9 g/dL — ABNORMAL LOW (ref 6.0–8.5)

## 2023-06-12 ENCOUNTER — Telehealth: Payer: Self-pay | Admitting: Pharmacist

## 2023-06-12 NOTE — Telephone Encounter (Signed)
Oral Chemotherapy Pharmacist Encounter   Called [facility] (tele: [phone]) to check on status of Revlimid (lenalidomide) sent in for patient on 06/07/23. Per representative French Ana, medication is ready to be set up for shipment and they have made an attempt of reaching out to patient to set up shipment, but did not have a call back. Confirmed with [facility] that they did have a copay card on file for patient to help with cost (pharmacy would not disclose cost to me on phone).   Called and spoke with patient of the above. Patient is going call [facility] today to set up shipment. Phone number and extension provided to patient (tele: [phone], ext: [phone]).  Patient knows to call the office with questions or concerns.  Lenord Carbo, PharmD, BCPS, BCOP Hematology/Oncology Clinical Pharmacist [facility] Oral Chemotherapy Navigation Clinics [phone] 06/12/2023 10:14 AM

## 2023-07-04 ENCOUNTER — Other Ambulatory Visit: Payer: Self-pay | Admitting: Hematology and Oncology

## 2023-07-07 ENCOUNTER — Other Ambulatory Visit: Payer: Self-pay

## 2023-07-13 ENCOUNTER — Encounter: Payer: Self-pay | Admitting: Hematology and Oncology

## 2023-07-13 ENCOUNTER — Other Ambulatory Visit: Payer: Self-pay | Admitting: Hematology and Oncology

## 2023-07-13 ENCOUNTER — Inpatient Hospital Stay: Payer: BC Managed Care – PPO

## 2023-07-13 ENCOUNTER — Inpatient Hospital Stay: Payer: BC Managed Care – PPO | Attending: Radiation Oncology

## 2023-07-13 ENCOUNTER — Inpatient Hospital Stay (HOSPITAL_BASED_OUTPATIENT_CLINIC_OR_DEPARTMENT_OTHER): Payer: BC Managed Care – PPO | Admitting: Hematology and Oncology

## 2023-07-13 VITALS — BP 127/68 | HR 65 | Temp 97.5°F | Resp 13 | Wt 198.3 lb

## 2023-07-13 VITALS — BP 117/63 | HR 65 | Temp 98.0°F | Resp 16

## 2023-07-13 DIAGNOSIS — C9000 Multiple myeloma not having achieved remission: Secondary | ICD-10-CM

## 2023-07-13 DIAGNOSIS — M542 Cervicalgia: Secondary | ICD-10-CM | POA: Diagnosis not present

## 2023-07-13 DIAGNOSIS — Z87442 Personal history of urinary calculi: Secondary | ICD-10-CM | POA: Insufficient documentation

## 2023-07-13 DIAGNOSIS — Z885 Allergy status to narcotic agent status: Secondary | ICD-10-CM | POA: Diagnosis not present

## 2023-07-13 DIAGNOSIS — Z7961 Long term (current) use of immunomodulator: Secondary | ICD-10-CM | POA: Insufficient documentation

## 2023-07-13 DIAGNOSIS — C9001 Multiple myeloma in remission: Secondary | ICD-10-CM

## 2023-07-13 DIAGNOSIS — B379 Candidiasis, unspecified: Secondary | ICD-10-CM | POA: Insufficient documentation

## 2023-07-13 DIAGNOSIS — Z9049 Acquired absence of other specified parts of digestive tract: Secondary | ICD-10-CM | POA: Insufficient documentation

## 2023-07-13 DIAGNOSIS — Z95828 Presence of other vascular implants and grafts: Secondary | ICD-10-CM

## 2023-07-13 DIAGNOSIS — D72819 Decreased white blood cell count, unspecified: Secondary | ICD-10-CM | POA: Diagnosis not present

## 2023-07-13 DIAGNOSIS — Z79890 Hormone replacement therapy (postmenopausal): Secondary | ICD-10-CM | POA: Insufficient documentation

## 2023-07-13 DIAGNOSIS — D6481 Anemia due to antineoplastic chemotherapy: Secondary | ICD-10-CM

## 2023-07-13 DIAGNOSIS — T451X5A Adverse effect of antineoplastic and immunosuppressive drugs, initial encounter: Secondary | ICD-10-CM | POA: Diagnosis not present

## 2023-07-13 DIAGNOSIS — Z79899 Other long term (current) drug therapy: Secondary | ICD-10-CM | POA: Diagnosis not present

## 2023-07-13 LAB — CMP (CANCER CENTER ONLY)
ALT: 14 U/L (ref 0–44)
AST: 15 U/L (ref 15–41)
Albumin: 4 g/dL (ref 3.5–5.0)
Alkaline Phosphatase: 81 U/L (ref 38–126)
Anion gap: 7 (ref 5–15)
BUN: 20 mg/dL (ref 8–23)
CO2: 25 mmol/L (ref 22–32)
Calcium: 8.7 mg/dL — ABNORMAL LOW (ref 8.9–10.3)
Chloride: 110 mmol/L (ref 98–111)
Creatinine: 0.98 mg/dL (ref 0.44–1.00)
GFR, Estimated: >60 mL/min (ref >60)
Glucose, Bld: 133 mg/dL — ABNORMAL HIGH (ref 70–99)
Potassium: 3.8 mmol/L (ref 3.5–5.1)
Sodium: 142 mmol/L (ref 135–145)
Total Bilirubin: 0.7 mg/dL (ref 0.3–1.2)
Total Protein: 6.2 g/dL — ABNORMAL LOW (ref 6.5–8.1)

## 2023-07-13 LAB — CBC WITH DIFFERENTIAL (CANCER CENTER ONLY)
Abs Immature Granulocytes: 0.01 10*3/uL (ref 0.00–0.07)
Basophils Absolute: 0.1 10*3/uL (ref 0.0–0.1)
Basophils Relative: 2 %
Eosinophils Absolute: 0.1 10*3/uL (ref 0.0–0.5)
Eosinophils Relative: 2 %
HCT: 36.7 % (ref 36.0–46.0)
Hemoglobin: 12.9 g/dL (ref 12.0–15.0)
Immature Granulocytes: 0 %
Lymphocytes Relative: 43 %
Lymphs Abs: 1.1 10*3/uL (ref 0.7–4.0)
MCH: 31.5 pg (ref 26.0–34.0)
MCHC: 35.1 g/dL (ref 30.0–36.0)
MCV: 89.7 fL (ref 80.0–100.0)
Monocytes Absolute: 0.3 10*3/uL (ref 0.1–1.0)
Monocytes Relative: 10 %
Neutro Abs: 1.1 10*3/uL — ABNORMAL LOW (ref 1.7–7.7)
Neutrophils Relative %: 43 %
Platelet Count: 141 10*3/uL — ABNORMAL LOW (ref 150–400)
RBC: 4.09 MIL/uL (ref 3.87–5.11)
RDW: 13.2 % (ref 11.5–15.5)
WBC Count: 2.5 10*3/uL — ABNORMAL LOW (ref 4.0–10.5)
nRBC: 0 % (ref 0.0–0.2)

## 2023-07-13 LAB — LACTATE DEHYDROGENASE: LDH: 206 U/L — ABNORMAL HIGH (ref 98–192)

## 2023-07-13 MED ORDER — SODIUM CHLORIDE 0.9% FLUSH
10.0000 mL | Freq: Once | INTRAVENOUS | Status: AC | PRN
  Administered 2023-07-13: 10 mL

## 2023-07-13 MED ORDER — ZOLEDRONIC ACID 4 MG/100ML IV SOLN
4.0000 mg | Freq: Once | INTRAVENOUS | Status: AC
  Administered 2023-07-13: 4 mg via INTRAVENOUS
  Filled 2023-07-13: qty 100

## 2023-07-13 MED ORDER — HEPARIN SOD (PORK) LOCK FLUSH 100 UNIT/ML IV SOLN
500.0000 [IU] | Freq: Once | INTRAVENOUS | Status: AC | PRN
  Administered 2023-07-13: 500 [IU]

## 2023-07-13 MED ORDER — SODIUM CHLORIDE 0.9 % IV SOLN: Freq: Once | INTRAVENOUS | Status: AC

## 2023-07-13 NOTE — Progress Notes (Signed)
Ok to give zometa today with calcium level of 8.7 per Dr. Leonides Schanz.

## 2023-07-13 NOTE — Progress Notes (Signed)
[facility] [phone]   [phone]  PROGRESS NOTE  Patient Care Team: Barbarann Ehlers as PCP - General (Physician Assistant)  Hematological/Oncological History # Plasmacytoma with Multiple Myeloma  07/29/2021: CT cervical spine shows expansile lytic lesion which almost completely replaces the normal C2 vertebral body  08/24/2021: PET CT scan shows increased metabolic activity at C2 and C7  08/25/2021: biopsy of C2 lesion showed finding consistent with plasma cell neoplasm.  09/08/2021: start radiation therapy 09/12/2021: establish care with Dr. Leonides Schanz  09/28/2021: end radiation therapy 10/10/2021: Bone marrow biopsy. Results confirm plasma cell neoplasm consistent with multiple myeloma, kappa restricted.  10/26/2021: Cycle 1 Day 1 of VRD 11/18/2021: Cycle 2 Day 1 of VRD 11/25/2021: Velcade HELD due to full body rash. Resolved with steroid therapy.  12/16/2021: Cycle 1 Day 1 of Dara/Rev/Dex 12/30/2021: HELD Daratumumab due to toxicity. Patient unable to tolerate. Requested holding the medication  01/06/2022: Cycle 1 Day 1 of Kd  02/03/2022: Cycle 2 Day 1 of Kd 03/03/2022: Cycle 3 Day 1 of Kd 03/31/2022: Cycle 4 Day 1 of Kd 04/28/2022: Cycle 5 Day 1 of Kd 05/26/2022: Cycle 6 Day 1 of Kd  06/30/2022: Cycle 7 Day 1 of Kd  07/20/2022: Cycle 8 Day 1 of Kd  Sept 2023: start of maintenance revlimid 10 mg PO daily 21 of 28 day cycles.  01/05/2023: HELD Revlimid due to cytopenias. 06/07/2023: restart maintenance revlimid 5 mg PO daily 21 days of 28 days.   Interval History:  Carol [name] 67 y.o. female with medical history significant for multiple myeloma with plasmacytoma who presents for a follow up visit. The patient's last visit was on 06/07/2023.  In the interim since her last visit she has restarted revlimid.    On exam today [name] reports she has been good overall interim since her last visit.  She has 2 symptoms that she thinks may be related to the  Revlimid.  She has a dry skin, particular on her palms and she is also been having some issues with some sores and occasional itching on her tongue as well.  She reports her energy levels are "okay".  Due to poor sleep she has been taking melatonin which helps her get to sleep but she does not stay asleep.  She goes to sleep around 8 PM and typically wakes up around 1 AM.  She does occasionally have some free time at work and takes a 40-minute nap around lunchtime.  She notes that she will be working a less in September going down from 50 hours a week down to 20 hours a week.  She reports that her neck discomfort is approximately the same with no new bone or back pain.  Her appetite is good and she is eating well, but she is worried about the fact that her weight continues to steadily increase.  Her weight is up to 198 pounds today.  She denies fevers, chills,  sweats, shortness of breath, chest pain or cough.She has no other complaints. Rest of the 10 point ROS is below.   MEDICAL HISTORY:  Past Medical History:  Diagnosis Date   Allergy    Anemia    Ankle edema    both ankles   Arthritis    hands,knees,elbows   Cancer (HCC) 2016   tumor kidney   Diabetes mellitus    type 2    Eczema    History of kidney stones    Hypothyroidism    Obesity  Sleep apnea    cpap machine setting of 3   Thyroid disease     SURGICAL HISTORY: Past Surgical History:  Procedure Laterality Date   BREAST BIOPSY Right 2001   CHOLECYSTECTOMY  2009   open   COLONOSCOPY     CYSTOSCOPY W/ RETROGRADES Left 11/22/2018   Procedure: CYSTOSCOPY WITH RETROGRADE PYELOGRAM;  Surgeon: Crist Fat, MD;  Location: [facility];  Service: Urology;  Laterality: Left;   CYSTOSCOPY WITH RETROGRADE PYELOGRAM, URETEROSCOPY AND STENT PLACEMENT Right 10/03/2021   Procedure: CYSTOSCOPY WITH RETROGRADE PYELOGRAM, URETEROSCOPY,STONE EXTRACTION AND STENT PLACEMENT;  Surgeon: Rene Paci, MD;  Location: [facility];   Service: Urology;  Laterality: Right;   DILATATION & CURRETTAGE/HYSTEROSCOPY WITH RESECTOCOPE N/A 01/21/2013   Procedure: DILATATION & CURETTAGE/HYSTEROSCOPY WITH RESECTOCOPE AND RESECTION OF ENDOMETRIAL;  Surgeon: Gretta Cool, MD;  Location: [facility];  Service: Gynecology;  Laterality: N/A;   HARDWARE REMOVAL N/A 06/05/2022   Procedure: Removal of Posterior Cervical Hardware from Occiput to Cervical Five;  Surgeon: Jadene Pierini, MD;  Location: [facility];  Service: Neurosurgery;  Laterality: N/A;   IR IMAGING GUIDED PORT INSERTION  02/01/2022   KNEE ARTHROSCOPY     left    POLYPECTOMY     POSTERIOR CERVICAL FUSION/FORAMINOTOMY N/A 08/25/2021   Procedure: Occiput to Cervical 5 Posterior cervical instrumented fusion with open biopsy of Cervical 2 Mass. Cervical Two Ganglionectomy;  Surgeon: Jadene Pierini, MD;  Location: [facility];  Service: Neurosurgery;  Laterality: N/A;   ROBOT ASSISTED PYELOPLASTY Left 11/22/2018   Procedure: XI ROBOTIC ASSISTED ATTEMPTED PYELOPLASTY CONVERTED TO NEPHRECTOMY/LYSIS OF ADHESIONS/UMBILICAL HERNIA REPAIR;  Surgeon: Crist Fat, MD;  Location: [facility];  Service: Urology;  Laterality: Left;   ROBOTIC ASSITED PARTIAL NEPHRECTOMY Left 06/11/2015   Procedure: LEFT ROBOTIC ASSISTED LAPAROSCOPIC  PARTIAL NEPHRECTOMY;  Surgeon: Crist Fat, MD;  Location: [facility];  Service: Urology;  Laterality: Left;   ROTATOR CUFF REPAIR Left 2011   URETERAL BIOPSY Left 02/14/2017   Procedure: URETERAL BIOPSY;  Surgeon: Crist Fat, MD;  Location: [facility];  Service: Urology;  Laterality: Left;   URETEROSCOPY WITH HOLMIUM LASER LITHOTRIPSY Left 02/14/2017   Procedure: URETEROSCOPY LITHOTRIPSY, URETERAL BALLOON DILATION;  Surgeon: Crist Fat, MD;  Location: [facility];  Service: Urology;  Laterality: Left;  With STENT    SOCIAL HISTORY: Social History   Socioeconomic History   Marital status: Married    Spouse name: Not on file   Number of  children: Not on file   Years of education: Not on file   Highest education level: Not on file  Occupational History   Not on file  Tobacco Use   Smoking status: Never   Smokeless tobacco: Never  Vaping Use   Vaping status: Never Used  Substance and Sexual Activity   Alcohol use: Yes    Comment: occ   Drug use: No   Sexual activity: Not on file  Other Topics Concern   Not on file  Social History Narrative   Not on file   Social Determinants of Health   Financial Resource Strain: Low Risk  (01/04/2023)   Received from [facility], [facility]   Overall Financial Resource Strain (CARDIA)    Difficulty of Paying Living Expenses: Not hard at all  Food Insecurity: No Food Insecurity (01/04/2023)   Received from [facility], [facility]   Hunger Vital Sign    Worried About Running Out of Food in the Last Year:  Never true    Ran Out of Food in the Last Year: Never true  Transportation Needs: No Transportation Needs (01/04/2023)   Received from [facility], [facility]   [facility] - Transportation    Lack of Transportation (Medical): No    Lack of Transportation (Non-Medical): No  Physical Activity: Sufficiently Active (08/07/2022)   Received from [facility], [facility]   Exercise Vital Sign    Days of Exercise per Week: 3 days    Minutes of Exercise per Session: 60 min  Stress: No Stress Concern Present (10/18/2022)   Received from [facility], [facility] of Occupational [facility] - Occupational Stress Questionnaire    Feeling of Stress : Only a little  Recent Concern: Stress - Stress Concern Present (08/07/2022)   Received from [facility] of Occupational Health - Occupational Stress Questionnaire    Feeling of Stress : To some extent  Social Connections: Socially Integrated (08/07/2022)   Received from [facility], [facility]   Social Network    How would you rate your social network (family, work, friends)?:  Good participation with social networks  Intimate Partner Violence: Not At Risk (08/07/2022)   Received from [facility], [facility]   HITS    Over the last 12 months how often did your partner physically hurt you?: 1    Over the last 12 months how often did your partner insult you or talk down to you?: 1    Over the last 12 months how often did your partner threaten you with physical harm?: 1    Over the last 12 months how often did your partner scream or curse at you?: 1    FAMILY HISTORY: Family History  Problem Relation Age of Onset   Cancer Father     ALLERGIES:  is allergic to morphine.  MEDICATIONS:  Current Outpatient Medications  Medication Sig Dispense Refill   Ascorbic Acid (VITAMIN C ADULT GUMMIES PO) Take 1 tablet by mouth daily.     atorvastatin (LIPITOR) 20 MG tablet Take 20 mg by mouth at bedtime.     Calcium Carbonate-Vitamin D 600-5 MG-MCG CAPS Take 1 capsule by mouth every morning.     Cholecalciferol (VITAMIN D3 PO) Take by mouth.     [employer] ASPIRIN LOW DOSE 81 MG tablet TAKE 1 TABLET (81 MG TOTAL) BY MOUTH DAILY. SWALLOW WHOLE. 90 tablet 3   Cyanocobalamin (VITAMIN B12) 1000 MCG TBCR Take 1 tablet by mouth every morning.     furosemide (LASIX) 20 MG tablet Take 20 mg by mouth daily.     KLOR-CON M20 20 MEQ tablet TAKE 1 TABLET BY MOUTH EVERY DAY 30 tablet 1   lenalidomide (REVLIMID) 5 MG capsule TAKE 1 CAPSULE BY MOUTH 1 TIME A DAY FOR 21 DAYS ON THEN 7 DAYS OFF 21 capsule 0   levothyroxine (SYNTHROID) 50 MCG tablet Take 50 mcg by mouth daily before breakfast.     lidocaine-prilocaine (EMLA) cream Apply 1 Application topically as needed. 30 g 0   Magnesium Hydroxide (MILK OF MAGNESIA PO) Take 15 mLs by mouth daily as needed (constipation/diarrhea).     ondansetron (ZOFRAN) 8 MG tablet Take 1 tablet (8 mg total) by mouth every 8 (eight) hours as needed. 30 tablet 0   OZEMPIC, 0.25 OR 0.5 MG/DOSE, 2 MG/1.5ML SOPN Inject 0.5 mg into the skin every Saturday.      prednisoLONE acetate (PRED FORTE) 1 % ophthalmic suspension Place 1 drop  into the left eye 4 (four) times daily.     PROAIR HFA 108 (90 BASE) MCG/ACT inhaler Inhale 2 puffs into the lungs every 4 (four) hours as needed for wheezing or shortness of breath. Uses seasonal  3   prochlorperazine (COMPAZINE) 10 MG tablet Take 1 tablet (10 mg total) by mouth every 6 (six) hours as needed for nausea or vomiting. 30 tablet 0   PROLENSA 0.07 % SOLN Place 1 drop into the right eye at bedtime.     Sennosides (SENOKOT PO) Take 1 tablet by mouth daily as needed (constipation). Bid     No current facility-administered medications for this visit.    REVIEW OF SYSTEMS:   Constitutional: ( - ) fevers, ( - )  chills , ( - ) night sweats Eyes: ( - ) blurriness of vision, ( - ) double vision, ( - ) watery eyes Ears, nose, mouth, throat, and face: ( - ) mucositis, ( - ) sore throat Respiratory: ( - ) cough, ( - ) dyspnea, ( - ) wheezes Cardiovascular: ( - ) palpitation, ( - ) chest discomfort, ( - ) lower extremity swelling Gastrointestinal:  ( - ) nausea, ( - ) heartburn, ( -) change in bowel habits Skin: ( - ) abnormal skin rashes Lymphatics: ( - ) new lymphadenopathy, ( - ) easy bruising Neurological: ( - ) numbness, ( - ) tingling, ( - ) new weaknesses Behavioral/Psych: ( - ) mood change, ( - ) new changes  All other systems were reviewed with the patient and are negative.  PHYSICAL EXAMINATION: ECOG PERFORMANCE STATUS: 1 - Symptomatic but completely ambulatory  Vitals:   07/13/23 0857  BP: 127/68  Pulse: 65  Resp: 13  Temp: (!) 97.5 F (36.4 C)  SpO2: 100%    Filed Weights   07/13/23 0857  Weight: 198 lb 4.8 oz (89.9 kg)     GENERAL: Well-appearing middle-age Caucasian female alert, no distress and comfortable SKIN: No evidence of rash, erythema, dry skin, or raised lesions. EYES: conjunctiva are pink and non-injected, sclera clear LUNGS: clear to auscultation and percussion with normal  breathing effort HEART: regular rate & rhythm and no murmurs and no lower extremity edema Musculoskeletal: no cyanosis of digits and no clubbing  PSYCH: alert & oriented x 3, fluent speech NEURO: no focal motor/sensory deficits  LABORATORY DATA:  I have reviewed the data as listed    Latest Ref Rng & Units 07/13/2023    8:23 AM 06/07/2023    9:32 AM 04/27/2023    2:42 PM  CBC  WBC 4.0 - 10.5 K/uL 2.5  3.6  3.4   Hemoglobin 12.0 - 15.0 g/dL 16.1  09.6  04.5   Hematocrit 36.0 - 46.0 % 36.7  38.4  36.3   Platelets 150 - 400 K/uL 141  125  148        Latest Ref Rng & Units 07/13/2023    8:23 AM 06/07/2023    9:32 AM 04/27/2023    2:42 PM  CMP  Glucose 70 - 99 mg/dL 409  811  914   BUN 8 - 23 mg/dL 20  18  18    Creatinine 0.44 - 1.00 mg/dL 7.82  9.56  2.13   Sodium 135 - 145 mmol/L 142  142  142   Potassium 3.5 - 5.1 mmol/L 3.8  4.2  3.7   Chloride 98 - 111 mmol/L 110  111  108   CO2 22 - 32 mmol/L 25  24  26   Calcium 8.9 - 10.3 mg/dL 8.7  9.1  8.8   Total Protein 6.5 - 8.1 g/dL 6.2  6.3  6.4   Total Bilirubin 0.3 - 1.2 mg/dL 0.7  0.7  0.6   Alkaline Phos 38 - 126 U/L 81  74  80   AST 15 - 41 U/L 15  13  14    ALT 0 - 44 U/L 14  11  11      Lab Results  Component Value Date   MPROTEIN Not Observed 06/07/2023   MPROTEIN Not Observed 04/27/2023   MPROTEIN Not Observed 03/30/2023   Lab Results  Component Value Date   KPAFRELGTCHN 10.4 06/07/2023   KPAFRELGTCHN 10.9 04/27/2023   KPAFRELGTCHN 13.9 03/30/2023   LAMBDASER 10.3 06/07/2023   LAMBDASER 9.4 04/27/2023   LAMBDASER 11.1 03/30/2023   KAPLAMBRATIO 1.01 06/07/2023   KAPLAMBRATIO 1.16 04/27/2023   KAPLAMBRATIO 1.25 03/30/2023    RADIOGRAPHIC STUDIES: No results found.  ASSESSMENT & PLAN Carol [name] is a 67 y.o. female with medical history significant for multiple myeloma with plasmacytoma who presents for a follow up visit.   # Free Kappa Multiple Myeloma # Plasmacytoma  -- original finding of L2 plasmacytoma  on biopsy of back lesion, bone marrow biopsy confirms greater than 60% plasma cells consistent with multiple myeloma. --Cycle 1 Day 1 of VRD treatment started on 10/26/2021.  --VRD therapy stopped on 11/25/2021 due to rash. Started Dara/Rev/Dex on 12/16/2021.  --will order monthly SPEP, UPEP, SFLC and beta 2 microglobulin --weekly CBC, CMP, and LDH --Cycle 1 Day 1 of Kd started on 01/06/2022 Plan:  --Labs show white blood cell 2.5, hemoglobin 12.9, MCV 89.7, and platelets of 141 --continue maintenance dose of 5 mg p.o. revlimid (21 days on, 7 off)  --Most recent myeloma labs from 06/07/2023 showed no M-protein was undetectable with normal serum free light chains. Continue monthly protein labs with SPEP and serum free light chains. --RTC in 4 weeks for clinic visit/labs  #Neck pain: --Present for the last month involving more the left lateral side with decreased mobility --CT cervical spine did not show any acute abnormalities on 04/19/2023.   #Left arm pain: --Resolved with holding Revlimid.   #Leukopenia/Thrombocytopenia/Anemia--improving --Likely secondary to chemotherapy --continue to monitor. Bleeding and neutropenic precautions given.  #Thrush, resolved: --Treated with Nystatin mouthwash with improvement.   #Metallic taste/weight loss-improving:  --Encouraged to increase caloric intake and supplement with protein shakes.  #Supportive Care -- chemotherapy education complete  -- port placement complete. -- sent prescription for EMLA cream -- zofran 8mg  q8H PRN and compazine 10mg  PO q6H for nausea -- Started zometa on 02/03/2022. Will continue q 3 months.HOLD zometa in June 2023 due to upcoming spine surgery. Last dose on 07/28/2022. Plan to restart today (August 2024) -- no pain medication required at this time.   No orders of the defined types were placed in this encounter.  All questions were answered. The patient knows to call the clinic with any problems, questions or  concerns.  I have spent a total of 30 minutes minutes of face-to-face and non-face-to-face time, preparing to see the patient, performing a medically appropriate examination, counseling and educating the patient, ordering medications/tests, documenting clinical information in the electronic health record, and care coordination.   Ulysees Barns, MD Department of Hematology/Oncology [facility] Phone: [phone] Pager: [phone] Email: [email or web address]  07/13/2023 9:15 AM

## 2023-07-13 NOTE — Patient Instructions (Signed)

## 2023-07-16 LAB — KAPPA/LAMBDA LIGHT CHAINS
Kappa free light chain: 17.3 mg/L (ref 3.3–19.4)
Kappa, lambda light chain ratio: 1.05 (ref 0.26–1.65)
Lambda free light chains: 16.5 mg/L (ref 5.7–26.3)

## 2023-07-19 LAB — MULTIPLE MYELOMA PANEL, SERUM
Albumin SerPl Elph-Mcnc: 3.6 g/dL (ref 2.9–4.4)
Albumin/Glob SerPl: 1.7 (ref 0.7–1.7)
Alpha 1: 0.2 g/dL (ref 0.0–0.4)
Alpha2 Glob SerPl Elph-Mcnc: 0.5 g/dL (ref 0.4–1.0)
B-Globulin SerPl Elph-Mcnc: 1 g/dL (ref 0.7–1.3)
Gamma Glob SerPl Elph-Mcnc: 0.4 g/dL (ref 0.4–1.8)
Globulin, Total: 2.2 g/dL (ref 2.2–3.9)
IgA: 82 mg/dL — ABNORMAL LOW (ref 87–352)
IgG (Immunoglobin G), Serum: 484 mg/dL — ABNORMAL LOW (ref 586–1602)
IgM (Immunoglobulin M), Srm: 34 mg/dL (ref 26–217)
Total Protein ELP: 5.8 g/dL — ABNORMAL LOW (ref 6.0–8.5)

## 2023-07-20 ENCOUNTER — Telehealth: Payer: Self-pay | Admitting: *Deleted

## 2023-07-20 NOTE — Telephone Encounter (Signed)
-----   Message from Ulysees Barns IV sent at 07/19/2023  4:49 PM EDT ----- Please let Carol [name] know that her M protein remains undetectable and serum free light chains are within normal limits.  Overall her myeloma is under excellent control.  We will plan to see her back as scheduled for labs in September and her next clinic visit in November 2024. ----- Message ----- From: Leory Plowman, Lab In [place] Sent: 07/13/2023   8:58 AM EDT To: Jaci Standard, MD

## 2023-07-20 NOTE — Telephone Encounter (Signed)
TCT patient regarding recent lab results.  No answer but was able to leave vm message on her identified home vm. Advised that her M protein remains undetectable and serum free light chains are within normal limits. Overall her myeloma is under excellent control. We will plan to see her back as scheduled for labs in September and her next clinic visit in November 2024.  Advised that she can call back to [phone] with any questions or concerns.

## 2023-08-01 ENCOUNTER — Other Ambulatory Visit: Payer: Self-pay | Admitting: Hematology and Oncology

## 2023-08-02 ENCOUNTER — Other Ambulatory Visit: Payer: Self-pay | Admitting: *Deleted

## 2023-08-02 MED ORDER — LENALIDOMIDE 5 MG PO CAPS: 5.0000 mg | ORAL_CAPSULE | Freq: Every day | ORAL | 0 refills | Status: DC

## 2023-08-09 ENCOUNTER — Other Ambulatory Visit: Payer: Self-pay | Admitting: Physician Assistant

## 2023-08-09 DIAGNOSIS — C9001 Multiple myeloma in remission: Secondary | ICD-10-CM

## 2023-08-10 ENCOUNTER — Inpatient Hospital Stay: Payer: BC Managed Care – PPO | Attending: Hematology and Oncology

## 2023-08-10 ENCOUNTER — Inpatient Hospital Stay (HOSPITAL_BASED_OUTPATIENT_CLINIC_OR_DEPARTMENT_OTHER): Payer: BC Managed Care – PPO | Admitting: Physician Assistant

## 2023-08-10 VITALS — BP 118/64 | HR 62 | Temp 98.2°F | Resp 17 | Ht 62.0 in | Wt 207.8 lb

## 2023-08-10 DIAGNOSIS — C9001 Multiple myeloma in remission: Secondary | ICD-10-CM

## 2023-08-10 DIAGNOSIS — C9000 Multiple myeloma not having achieved remission: Secondary | ICD-10-CM | POA: Insufficient documentation

## 2023-08-10 DIAGNOSIS — Z95828 Presence of other vascular implants and grafts: Secondary | ICD-10-CM

## 2023-08-10 LAB — CBC WITH DIFFERENTIAL (CANCER CENTER ONLY)
Abs Immature Granulocytes: 0.01 10*3/uL (ref 0.00–0.07)
Basophils Absolute: 0.1 10*3/uL (ref 0.0–0.1)
Basophils Relative: 2 %
Eosinophils Absolute: 0.1 10*3/uL (ref 0.0–0.5)
Eosinophils Relative: 5 %
HCT: 38.2 % (ref 36.0–46.0)
Hemoglobin: 13.2 g/dL (ref 12.0–15.0)
Immature Granulocytes: 0 %
Lymphocytes Relative: 43 %
Lymphs Abs: 1 10*3/uL (ref 0.7–4.0)
MCH: 31 pg (ref 26.0–34.0)
MCHC: 34.6 g/dL (ref 30.0–36.0)
MCV: 89.7 fL (ref 80.0–100.0)
Monocytes Absolute: 0.3 10*3/uL (ref 0.1–1.0)
Monocytes Relative: 10 %
Neutro Abs: 1 10*3/uL — ABNORMAL LOW (ref 1.7–7.7)
Neutrophils Relative %: 40 %
Platelet Count: 112 10*3/uL — ABNORMAL LOW (ref 150–400)
RBC: 4.26 MIL/uL (ref 3.87–5.11)
RDW: 13.2 % (ref 11.5–15.5)
WBC Count: 2.4 10*3/uL — ABNORMAL LOW (ref 4.0–10.5)
nRBC: 0 % (ref 0.0–0.2)

## 2023-08-10 LAB — CMP (CANCER CENTER ONLY)
ALT: 18 U/L (ref 0–44)
AST: 14 U/L — ABNORMAL LOW (ref 15–41)
Albumin: 4.1 g/dL (ref 3.5–5.0)
Alkaline Phosphatase: 79 U/L (ref 38–126)
Anion gap: 7 (ref 5–15)
BUN: 15 mg/dL (ref 8–23)
CO2: 26 mmol/L (ref 22–32)
Calcium: 8.6 mg/dL — ABNORMAL LOW (ref 8.9–10.3)
Chloride: 110 mmol/L (ref 98–111)
Creatinine: 0.96 mg/dL (ref 0.44–1.00)
GFR, Estimated: >60 mL/min (ref >60)
Glucose, Bld: 132 mg/dL — ABNORMAL HIGH (ref 70–99)
Potassium: 4 mmol/L (ref 3.5–5.1)
Sodium: 143 mmol/L (ref 135–145)
Total Bilirubin: 0.8 mg/dL (ref 0.3–1.2)
Total Protein: 6.4 g/dL — ABNORMAL LOW (ref 6.5–8.1)

## 2023-08-10 MED ORDER — HEPARIN SOD (PORK) LOCK FLUSH 100 UNIT/ML IV SOLN: 500.0000 [IU] | Freq: Once | INTRAVENOUS | Status: DC

## 2023-08-10 MED ORDER — SODIUM CHLORIDE 0.9% FLUSH: 10.0000 mL | Freq: Once | INTRAVENOUS | Status: DC

## 2023-08-10 MED ORDER — HEPARIN SOD (PORK) LOCK FLUSH 100 UNIT/ML IV SOLN
500.0000 [IU] | Freq: Once | INTRAVENOUS | Status: AC | PRN
  Administered 2023-08-10: 500 [IU]

## 2023-08-10 MED ORDER — SODIUM CHLORIDE 0.9% FLUSH
10.0000 mL | Freq: Once | INTRAVENOUS | Status: AC | PRN
  Administered 2023-08-10: 10 mL

## 2023-08-10 NOTE — Progress Notes (Signed)
[facility] [phone]   [phone]  PROGRESS NOTE  Patient Care Team: Barbarann Ehlers as PCP - General (Physician Assistant)  Hematological/Oncological History # Plasmacytoma with Multiple Myeloma  07/29/2021: CT cervical spine shows expansile lytic lesion which almost completely replaces the normal C2 vertebral body  08/24/2021: PET CT scan shows increased metabolic activity at C2 and C7  08/25/2021: biopsy of C2 lesion showed finding consistent with plasma cell neoplasm.  09/08/2021: start radiation therapy 09/12/2021: establish care with Dr. Leonides Schanz  09/28/2021: end radiation therapy 10/10/2021: Bone marrow biopsy. Results confirm plasma cell neoplasm consistent with multiple myeloma, kappa restricted.  10/26/2021: Cycle 1 Day 1 of VRD 11/18/2021: Cycle 2 Day 1 of VRD 11/25/2021: Velcade HELD due to full body rash. Resolved with steroid therapy.  12/16/2021: Cycle 1 Day 1 of Dara/Rev/Dex 12/30/2021: HELD Daratumumab due to toxicity. Patient unable to tolerate. Requested holding the medication  01/06/2022: Cycle 1 Day 1 of Kd  02/03/2022: Cycle 2 Day 1 of Kd 03/03/2022: Cycle 3 Day 1 of Kd 03/31/2022: Cycle 4 Day 1 of Kd 04/28/2022: Cycle 5 Day 1 of Kd 05/26/2022: Cycle 6 Day 1 of Kd  06/30/2022: Cycle 7 Day 1 of Kd  07/20/2022: Cycle 8 Day 1 of Kd  Sept 2023: start of maintenance revlimid 10 mg PO daily 21 of 28 day cycles.  01/05/2023: HELD Revlimid due to cytopenias. 06/07/2023: restart maintenance revlimid 5 mg PO daily 21 days of 28 days.   Interval History:  Carol [name] 67 y.o. female with medical history significant for multiple myeloma with plasmacytoma who presents for a follow up visit. The patient's last visit was on 07/13/2023.  In the interim since her last visit she has continued on revlimid.    On exam today Carol [name] reports overall she is doing well. She does have some fatigue and difficulty sleeping which is chronic in nature. She is  able to complete all her ADLS on her own. She denies any appetite or weight changes. She denies nausea, vomiting or bowel habit changes. She reports occasional R lower flank discomfort which resolves on its own. She denies any urinary symptoms. She does bruise easily on her arms but no signs of bleeding. She denies any new bone/back pain. She is tolerating Revlimid therapy with occasional, mild mouth sores that she manages with salt water/baking soda rinses. She denies fevers, chills, sweats, shortness of breath, chest pain or cough. She has no other complaints. Rest of the ROS is below.    MEDICAL HISTORY:  Past Medical History:  Diagnosis Date   Allergy    Anemia    Ankle edema    both ankles   Arthritis    hands,knees,elbows   Cancer (HCC) 2016   tumor kidney   Diabetes mellitus    type 2    Eczema    History of kidney stones    Hypothyroidism    Obesity    Sleep apnea    cpap machine setting of 3   Thyroid disease     SURGICAL HISTORY: Past Surgical History:  Procedure Laterality Date   BREAST BIOPSY Right 2001   CHOLECYSTECTOMY  2009   open   COLONOSCOPY     CYSTOSCOPY W/ RETROGRADES Left 11/22/2018   Procedure: CYSTOSCOPY WITH RETROGRADE PYELOGRAM;  Surgeon: Crist Fat, MD;  Location: [facility];  Service: Urology;  Laterality: Left;   CYSTOSCOPY WITH RETROGRADE PYELOGRAM, URETEROSCOPY AND STENT PLACEMENT Right 10/03/2021   Procedure: CYSTOSCOPY  WITH RETROGRADE PYELOGRAM, URETEROSCOPY,STONE EXTRACTION AND STENT PLACEMENT;  Surgeon: Rene Paci, MD;  Location: [facility];  Service: Urology;  Laterality: Right;   DILATATION & CURRETTAGE/HYSTEROSCOPY WITH RESECTOCOPE N/A 01/21/2013   Procedure: DILATATION & CURETTAGE/HYSTEROSCOPY WITH RESECTOCOPE AND RESECTION OF ENDOMETRIAL;  Surgeon: Gretta Cool, MD;  Location: [facility];  Service: Gynecology;  Laterality: N/A;   HARDWARE REMOVAL N/A 06/05/2022   Procedure: Removal of Posterior Cervical  Hardware from Occiput to Cervical Five;  Surgeon: Jadene Pierini, MD;  Location: [facility];  Service: Neurosurgery;  Laterality: N/A;   IR IMAGING GUIDED PORT INSERTION  02/01/2022   KNEE ARTHROSCOPY     left    POLYPECTOMY     POSTERIOR CERVICAL FUSION/FORAMINOTOMY N/A 08/25/2021   Procedure: Occiput to Cervical 5 Posterior cervical instrumented fusion with open biopsy of Cervical 2 Mass. Cervical Two Ganglionectomy;  Surgeon: Jadene Pierini, MD;  Location: [facility];  Service: Neurosurgery;  Laterality: N/A;   ROBOT ASSISTED PYELOPLASTY Left 11/22/2018   Procedure: XI ROBOTIC ASSISTED ATTEMPTED PYELOPLASTY CONVERTED TO NEPHRECTOMY/LYSIS OF ADHESIONS/UMBILICAL HERNIA REPAIR;  Surgeon: Crist Fat, MD;  Location: [facility];  Service: Urology;  Laterality: Left;   ROBOTIC ASSITED PARTIAL NEPHRECTOMY Left 06/11/2015   Procedure: LEFT ROBOTIC ASSISTED LAPAROSCOPIC  PARTIAL NEPHRECTOMY;  Surgeon: Crist Fat, MD;  Location: [facility];  Service: Urology;  Laterality: Left;   ROTATOR CUFF REPAIR Left 2011   URETERAL BIOPSY Left 02/14/2017   Procedure: URETERAL BIOPSY;  Surgeon: Crist Fat, MD;  Location: [facility];  Service: Urology;  Laterality: Left;   URETEROSCOPY WITH HOLMIUM LASER LITHOTRIPSY Left 02/14/2017   Procedure: URETEROSCOPY LITHOTRIPSY, URETERAL BALLOON DILATION;  Surgeon: Crist Fat, MD;  Location: [facility];  Service: Urology;  Laterality: Left;  With STENT    SOCIAL HISTORY: Social History   Socioeconomic History   Marital status: Married    Spouse name: Not on file   Number of children: Not on file   Years of education: Not on file   Highest education level: Not on file  Occupational History   Not on file  Tobacco Use   Smoking status: Never   Smokeless tobacco: Never  Vaping Use   Vaping status: Never Used  Substance and Sexual Activity   Alcohol use: Yes    Comment: occ   Drug use: No   Sexual activity: Not on file  Other Topics Concern   Not on  file  Social History Narrative   Not on file   Social Determinants of Health   Financial Resource Strain: Low Risk  (01/04/2023)   Received from [facility], [facility]   Overall Financial Resource Strain (CARDIA)    Difficulty of Paying Living Expenses: Not hard at all  Food Insecurity: No Food Insecurity (01/04/2023)   Received from [facility], [facility]   Hunger Vital Sign    Worried About Running Out of Food in the Last Year: Never true    Ran Out of Food in the Last Year: Never true  Transportation Needs: No Transportation Needs (01/04/2023)   Received from [facility], [facility]   PRAPARE - Transportation    Lack of Transportation (Medical): No    Lack of Transportation (Non-Medical): No  Physical Activity: Sufficiently Active (08/07/2022)   Received from [facility], [facility]   Exercise Vital Sign    Days of Exercise per Week: 3 days    Minutes of Exercise per Session: 60 min  Stress:  No Stress Concern Present (10/18/2022)   Received from [facility], [facility] - Occupational Stress Questionnaire    Feeling of Stress : Only a little  Recent Concern: Stress - Stress Concern Present (08/07/2022)   Received from [facility] - Occupational Stress Questionnaire    Feeling of Stress : To some extent  Social Connections: Socially Integrated (08/07/2022)   Received from [facility], [facility]   Social Network    How would you rate your social network (family, work, friends)?: Good participation with social networks  Intimate Partner Violence: Not At Risk (08/07/2022)   Received from [facility], [facility]   HITS    Over the last 12 months how often did your partner physically hurt you?: 1    Over the last 12 months how often did your partner insult you or talk down to you?: 1    Over the last 12 months how often did your partner threaten you with  physical harm?: 1    Over the last 12 months how often did your partner scream or curse at you?: 1    FAMILY HISTORY: Family History  Problem Relation Age of Onset   Cancer Father     ALLERGIES:  is allergic to morphine.  MEDICATIONS:  Current Outpatient Medications  Medication Sig Dispense Refill   Ascorbic Acid (VITAMIN C ADULT GUMMIES PO) Take 1 tablet by mouth daily.     atorvastatin (LIPITOR) 20 MG tablet Take 20 mg by mouth at bedtime.     Calcium Carbonate-Vitamin D 600-5 MG-MCG CAPS Take 1 capsule by mouth every morning.     Cholecalciferol (VITAMIN D3 PO) Take by mouth.     [employer] ASPIRIN LOW DOSE 81 MG tablet TAKE 1 TABLET (81 MG TOTAL) BY MOUTH DAILY. SWALLOW WHOLE. 90 tablet 3   Cyanocobalamin (VITAMIN B12) 1000 MCG TBCR Take 1 tablet by mouth every morning.     furosemide (LASIX) 20 MG tablet Take 20 mg by mouth daily.     KLOR-CON M20 20 MEQ tablet TAKE 1 TABLET BY MOUTH EVERY DAY 30 tablet 1   lenalidomide (REVLIMID) 5 MG capsule Take 1 capsule (5 mg total) by mouth daily. [employer] Auth # [id number]   Date Obtained 08/02/23 Take 1 capsule daily for 21 days then none for 7 days 21 capsule 0   levothyroxine (SYNTHROID) 50 MCG tablet Take 50 mcg by mouth daily before breakfast.     lidocaine-prilocaine (EMLA) cream Apply 1 Application topically as needed. 30 g 0   Magnesium Hydroxide (MILK OF MAGNESIA PO) Take 15 mLs by mouth daily as needed (constipation/diarrhea).     ondansetron (ZOFRAN) 8 MG tablet Take 1 tablet (8 mg total) by mouth every 8 (eight) hours as needed. 30 tablet 0   OZEMPIC, 0.25 OR 0.5 MG/DOSE, 2 MG/1.5ML SOPN Inject 0.5 mg into the skin every Saturday.     prednisoLONE acetate (PRED FORTE) 1 % ophthalmic suspension Place 1 drop into the left eye 4 (four) times daily.     PROAIR HFA 108 (90 BASE) MCG/ACT inhaler Inhale 2 puffs into the lungs every 4 (four) hours as needed for wheezing or shortness of breath. Uses seasonal  3   prochlorperazine (COMPAZINE) 10 MG  tablet Take 1 tablet (10 mg total) by mouth every 6 (six) hours as needed for nausea or vomiting. 30 tablet 0   PROLENSA 0.07 % SOLN Place 1 drop into  the right eye at bedtime.     Sennosides (SENOKOT PO) Take 1 tablet by mouth daily as needed (constipation). Bid     No current facility-administered medications for this visit.    REVIEW OF SYSTEMS:   Constitutional: ( - ) fevers, ( - )  chills , ( - ) night sweats Eyes: ( - ) blurriness of vision, ( - ) double vision, ( - ) watery eyes Ears, nose, mouth, throat, and face: ( - ) mucositis, ( - ) sore throat Respiratory: ( - ) cough, ( - ) dyspnea, ( - ) wheezes Cardiovascular: ( - ) palpitation, ( - ) chest discomfort, ( - ) lower extremity swelling Gastrointestinal:  ( - ) nausea, ( - ) heartburn, ( -) change in bowel habits Skin: ( - ) abnormal skin rashes Lymphatics: ( - ) new lymphadenopathy, ( - ) easy bruising Neurological: ( - ) numbness, ( - ) tingling, ( - ) new weaknesses Behavioral/Psych: ( - ) mood change, ( - ) new changes  All other systems were reviewed with the patient and are negative.  PHYSICAL EXAMINATION: ECOG PERFORMANCE STATUS: 1 - Symptomatic but completely ambulatory  Vitals:   08/10/23 0900  BP: 118/64  Pulse: 62  Resp: 17  Temp: 98.2 F (36.8 C)  SpO2: 100%    Filed Weights   08/10/23 0900  Weight: 207 lb 12.8 oz (94.3 kg)     GENERAL: Well-appearing middle-age Caucasian female alert, no distress and comfortable SKIN: No evidence of rash, erythema, dry skin, or raised lesions. EYES: conjunctiva are pink and non-injected, sclera clear LUNGS: clear to auscultation and percussion with normal breathing effort HEART: regular rate & rhythm and no murmurs and no lower extremity edema Musculoskeletal: no cyanosis of digits and no clubbing  PSYCH: alert & oriented x 3, fluent speech NEURO: no focal motor/sensory deficits  LABORATORY DATA:  I have reviewed the data as listed    Latest Ref Rng & Units  08/10/2023    7:54 AM 07/13/2023    8:23 AM 06/07/2023    9:32 AM  CBC  WBC 4.0 - 10.5 K/uL 2.4  2.5  3.6   Hemoglobin 12.0 - 15.0 g/dL 40.9  81.1  91.4   Hematocrit 36.0 - 46.0 % 38.2  36.7  38.4   Platelets 150 - 400 K/uL 112  141  125        Latest Ref Rng & Units 07/13/2023    8:23 AM 06/07/2023    9:32 AM 04/27/2023    2:42 PM  CMP  Glucose 70 - 99 mg/dL 782  956  213   BUN 8 - 23 mg/dL 20  18  18    Creatinine 0.44 - 1.00 mg/dL 0.86  5.78  4.69   Sodium 135 - 145 mmol/L 142  142  142   Potassium 3.5 - 5.1 mmol/L 3.8  4.2  3.7   Chloride 98 - 111 mmol/L 110  111  108   CO2 22 - 32 mmol/L 25  24  26    Calcium 8.9 - 10.3 mg/dL 8.7  9.1  8.8   Total Protein 6.5 - 8.1 g/dL 6.2  6.3  6.4   Total Bilirubin 0.3 - 1.2 mg/dL 0.7  0.7  0.6   Alkaline Phos 38 - 126 U/L 81  74  80   AST 15 - 41 U/L 15  13  14    ALT 0 - 44 U/L 14  11  11  Lab Results  Component Value Date   MPROTEIN Not Observed 07/13/2023   MPROTEIN Not Observed 06/07/2023   MPROTEIN Not Observed 04/27/2023   Lab Results  Component Value Date   KPAFRELGTCHN 17.3 07/13/2023   KPAFRELGTCHN 10.4 06/07/2023   KPAFRELGTCHN 10.9 04/27/2023   LAMBDASER 16.5 07/13/2023   LAMBDASER 10.3 06/07/2023   LAMBDASER 9.4 04/27/2023   KAPLAMBRATIO 1.05 07/13/2023   KAPLAMBRATIO 1.01 06/07/2023   KAPLAMBRATIO 1.16 04/27/2023    RADIOGRAPHIC STUDIES: No results found.  ASSESSMENT & PLAN Carol [name] is a 67 y.o. female with medical history significant for multiple myeloma with plasmacytoma who presents for a follow up visit.   # Free Kappa Multiple Myeloma # Plasmacytoma  -- original finding of L2 plasmacytoma on biopsy of back lesion, bone marrow biopsy confirms greater than 60% plasma cells consistent with multiple myeloma. --Cycle 1 Day 1 of VRD treatment started on 10/26/2021.  --VRD therapy stopped on 11/25/2021 due to rash. Started Dara/Rev/Dex on 12/16/2021.  --will order monthly SPEP, UPEP, SFLC and beta 2  microglobulin --weekly CBC, CMP, and LDH --Cycle 1 Day 1 of Kd started on 01/06/2022 Plan:  --Labs show white blood cell 2.4, hemoglobin 13.2, MCV 89.7, and platelets of 112 --continue maintenance dose of 5 mg p.o. revlimid (21 days on, 7 off)  --Most recent myeloma labs from 07/13/2023 showed no M-protein was undetectable with normal serum free light chains. Continue monthly protein labs with SPEP and serum free light chains. --RTC in 4 weeks for clinic visit/labs  #Neck pain: --Present for the last month involving more the left lateral side with decreased mobility --CT cervical spine did not show any acute abnormalities on 04/19/2023.   #Left arm pain: --Resolved with holding Revlimid.   #Leukopenia/Thrombocytopenia/Anemia --Likely secondary to chemotherapy --continue to monitor. Bleeding and neutropenic precautions given.  #Thrush, resolved: --Treated with Nystatin mouthwash with improvement.   #Metallic taste/weight loss-improving:  --Encouraged to increase caloric intake and supplement with protein shakes.  #Supportive Care -- chemotherapy education complete  -- port placement complete. -- sent prescription for EMLA cream -- zofran 8mg  q8H PRN and compazine 10mg  PO q6H for nausea -- Started zometa on 02/03/2022. Will continue q 3 months.HOLD zometa in June 2023 due to upcoming spine surgery. Last dose on 07/28/2022. Plan to restart today (August 2024) -- no pain medication required at this time.   No orders of the defined types were placed in this encounter.  All questions were answered. The patient knows to call the clinic with any problems, questions or concerns.  I have spent a total of 30 minutes minutes of face-to-face and non-face-to-face time, preparing to see the patient, performing a medically appropriate examination, counseling and educating the patient, ordering medications/tests, documenting clinical information in the electronic health record, and care coordination.    Georga Kaufmann PA-C Dept of Hematology and Oncology [facility] at [facility] Phone: [phone]  08/10/2023 9:09 AM

## 2023-08-13 LAB — KAPPA/LAMBDA LIGHT CHAINS
Kappa free light chain: 18 mg/L (ref 3.3–19.4)
Kappa, lambda light chain ratio: 1.18 (ref 0.26–1.65)
Lambda free light chains: 15.2 mg/L (ref 5.7–26.3)

## 2023-08-16 LAB — MULTIPLE MYELOMA PANEL, SERUM
Albumin SerPl Elph-Mcnc: 3.7 g/dL (ref 2.9–4.4)
Albumin/Glob SerPl: 1.7 (ref 0.7–1.7)
Alpha 1: 0.2 g/dL (ref 0.0–0.4)
Alpha2 Glob SerPl Elph-Mcnc: 0.6 g/dL (ref 0.4–1.0)
B-Globulin SerPl Elph-Mcnc: 1 g/dL (ref 0.7–1.3)
Gamma Glob SerPl Elph-Mcnc: 0.4 g/dL (ref 0.4–1.8)
Globulin, Total: 2.2 g/dL (ref 2.2–3.9)
IgA: 78 mg/dL — ABNORMAL LOW (ref 87–352)
IgG (Immunoglobin G), Serum: 517 mg/dL — ABNORMAL LOW (ref 586–1602)
IgM (Immunoglobulin M), Srm: 30 mg/dL (ref 26–217)
Total Protein ELP: 5.9 g/dL — ABNORMAL LOW (ref 6.0–8.5)

## 2023-09-04 ENCOUNTER — Other Ambulatory Visit: Payer: Self-pay

## 2023-09-04 ENCOUNTER — Other Ambulatory Visit: Payer: Self-pay | Admitting: Hematology and Oncology

## 2023-09-04 MED ORDER — LENALIDOMIDE 5 MG PO CAPS: 5.0000 mg | ORAL_CAPSULE | Freq: Every day | ORAL | 0 refills | Status: DC

## 2023-09-06 ENCOUNTER — Other Ambulatory Visit: Payer: Self-pay | Admitting: Physician Assistant

## 2023-09-06 ENCOUNTER — Other Ambulatory Visit: Payer: Self-pay | Admitting: Hematology and Oncology

## 2023-09-06 DIAGNOSIS — C9001 Multiple myeloma in remission: Secondary | ICD-10-CM

## 2023-09-07 ENCOUNTER — Inpatient Hospital Stay: Payer: BC Managed Care – PPO | Attending: Radiation Oncology

## 2023-09-07 ENCOUNTER — Telehealth: Payer: Self-pay

## 2023-09-07 ENCOUNTER — Inpatient Hospital Stay (HOSPITAL_BASED_OUTPATIENT_CLINIC_OR_DEPARTMENT_OTHER): Payer: BC Managed Care – PPO | Admitting: Physician Assistant

## 2023-09-07 VITALS — BP 130/69 | HR 65 | Resp 15 | Wt 211.4 lb

## 2023-09-07 DIAGNOSIS — Z79899 Other long term (current) drug therapy: Secondary | ICD-10-CM | POA: Diagnosis not present

## 2023-09-07 DIAGNOSIS — C9001 Multiple myeloma in remission: Secondary | ICD-10-CM

## 2023-09-07 DIAGNOSIS — C9000 Multiple myeloma not having achieved remission: Secondary | ICD-10-CM | POA: Insufficient documentation

## 2023-09-07 DIAGNOSIS — Z7961 Long term (current) use of immunomodulator: Secondary | ICD-10-CM | POA: Diagnosis not present

## 2023-09-07 DIAGNOSIS — Z7982 Long term (current) use of aspirin: Secondary | ICD-10-CM | POA: Insufficient documentation

## 2023-09-07 DIAGNOSIS — Z95828 Presence of other vascular implants and grafts: Secondary | ICD-10-CM

## 2023-09-07 LAB — CMP (CANCER CENTER ONLY)
ALT: 12 U/L (ref 0–44)
AST: 13 U/L — ABNORMAL LOW (ref 15–41)
Albumin: 4 g/dL (ref 3.5–5.0)
Alkaline Phosphatase: 61 U/L (ref 38–126)
Anion gap: 7 (ref 5–15)
BUN: 16 mg/dL (ref 8–23)
CO2: 25 mmol/L (ref 22–32)
Calcium: 8.5 mg/dL — ABNORMAL LOW (ref 8.9–10.3)
Chloride: 110 mmol/L (ref 98–111)
Creatinine: 1.03 mg/dL — ABNORMAL HIGH (ref 0.44–1.00)
GFR, Estimated: 60 mL/min — ABNORMAL LOW (ref >60)
Glucose, Bld: 119 mg/dL — ABNORMAL HIGH (ref 70–99)
Potassium: 3.8 mmol/L (ref 3.5–5.1)
Sodium: 142 mmol/L (ref 135–145)
Total Bilirubin: 0.9 mg/dL (ref 0.3–1.2)
Total Protein: 6.2 g/dL — ABNORMAL LOW (ref 6.5–8.1)

## 2023-09-07 LAB — CBC WITH DIFFERENTIAL (CANCER CENTER ONLY)
Abs Immature Granulocytes: 0.01 10*3/uL (ref 0.00–0.07)
Basophils Absolute: 0 10*3/uL (ref 0.0–0.1)
Basophils Relative: 2 %
Eosinophils Absolute: 0.1 10*3/uL (ref 0.0–0.5)
Eosinophils Relative: 6 %
HCT: 36 % (ref 36.0–46.0)
Hemoglobin: 12.9 g/dL (ref 12.0–15.0)
Immature Granulocytes: 0 %
Lymphocytes Relative: 38 %
Lymphs Abs: 0.9 10*3/uL (ref 0.7–4.0)
MCH: 30.7 pg (ref 26.0–34.0)
MCHC: 35.8 g/dL (ref 30.0–36.0)
MCV: 85.7 fL (ref 80.0–100.0)
Monocytes Absolute: 0.3 10*3/uL (ref 0.1–1.0)
Monocytes Relative: 13 %
Neutro Abs: 0.9 10*3/uL — ABNORMAL LOW (ref 1.7–7.7)
Neutrophils Relative %: 41 %
Platelet Count: 113 10*3/uL — ABNORMAL LOW (ref 150–400)
RBC: 4.2 MIL/uL (ref 3.87–5.11)
RDW: 13.4 % (ref 11.5–15.5)
WBC Count: 2.2 10*3/uL — ABNORMAL LOW (ref 4.0–10.5)
nRBC: 0 % (ref 0.0–0.2)

## 2023-09-07 MED ORDER — HEPARIN SOD (PORK) LOCK FLUSH 100 UNIT/ML IV SOLN
500.0000 [IU] | Freq: Once | INTRAVENOUS | Status: AC
  Administered 2023-09-07: 500 [IU]

## 2023-09-07 MED ORDER — SODIUM CHLORIDE 0.9% FLUSH
10.0000 mL | Freq: Once | INTRAVENOUS | Status: AC
  Administered 2023-09-07: 10 mL

## 2023-09-07 NOTE — Telephone Encounter (Signed)
LVM for pt to take Revlimid 5 mg every other day and the next refill that we will send in for her we will drop down to 2.5 mg, per Dr. Leonides Schanz. Call back number [phone] provided.

## 2023-09-07 NOTE — Progress Notes (Signed)
[facility] [phone]   [phone]  PROGRESS NOTE  Patient Care Team: Barbarann Ehlers as PCP - General (Physician Assistant)  Hematological/Oncological History # Plasmacytoma with Multiple Myeloma  07/29/2021: CT cervical spine shows expansile lytic lesion which almost completely replaces the normal C2 vertebral body  08/24/2021: PET CT scan shows increased metabolic activity at C2 and C7  08/25/2021: biopsy of C2 lesion showed finding consistent with plasma cell neoplasm.  09/08/2021: start radiation therapy 09/12/2021: establish care with Dr. Leonides Schanz  09/28/2021: end radiation therapy 10/10/2021: Bone marrow biopsy. Results confirm plasma cell neoplasm consistent with multiple myeloma, kappa restricted.  10/26/2021: Cycle 1 Day 1 of VRD 11/18/2021: Cycle 2 Day 1 of VRD 11/25/2021: Velcade HELD due to full body rash. Resolved with steroid therapy.  12/16/2021: Cycle 1 Day 1 of Dara/Rev/Dex 12/30/2021: HELD Daratumumab due to toxicity. Patient unable to tolerate. Requested holding the medication  01/06/2022: Cycle 1 Day 1 of Kd  02/03/2022: Cycle 2 Day 1 of Kd 03/03/2022: Cycle 3 Day 1 of Kd 03/31/2022: Cycle 4 Day 1 of Kd 04/28/2022: Cycle 5 Day 1 of Kd 05/26/2022: Cycle 6 Day 1 of Kd  06/30/2022: Cycle 7 Day 1 of Kd  07/20/2022: Cycle 8 Day 1 of Kd  Sept 2023: start of maintenance revlimid 10 mg PO daily 21 of 28 day cycles.  01/05/2023: HELD Revlimid due to cytopenias. 06/07/2023: restart maintenance revlimid 5 mg PO daily 21 days of 28 days.  09/07/2023: Dose reduce maintenance revlimid to 2.5 mg PO daily 21 days on 7 days off.   Interval History:  Carol [name] 67 y.o. female with medical history significant for multiple myeloma with plasmacytoma who presents for a follow up visit. The patient's last visit was on 08/10/2023.  In the interim since her last visit she has continued on revlimid.    On exam today Carol [name] reports overall she is doing  well. Her energy and appetite are stable. She is able to complete her daily activities on her own. She denies nausea, vomiting or bowel habit changes. She reports mild neck pain and stiffness. She does bruise easily on her arms but no signs of bleeding. She denies any new bone/back pain. She is tolerating Revlimid therapy. She denies fevers, chills, sweats, shortness of breath, chest pain or cough. She has no other complaints. Rest of the ROS is below.    MEDICAL HISTORY:  Past Medical History:  Diagnosis Date   Allergy    Anemia    Ankle edema    both ankles   Arthritis    hands,knees,elbows   Cancer (HCC) 2016   tumor kidney   Diabetes mellitus    type 2    Eczema    History of kidney stones    Hypothyroidism    Obesity    Sleep apnea    cpap machine setting of 3   Thyroid disease     SURGICAL HISTORY: Past Surgical History:  Procedure Laterality Date   BREAST BIOPSY Right 2001   CHOLECYSTECTOMY  2009   open   COLONOSCOPY     CYSTOSCOPY W/ RETROGRADES Left 11/22/2018   Procedure: CYSTOSCOPY WITH RETROGRADE PYELOGRAM;  Surgeon: Crist Fat, MD;  Location: [facility];  Service: Urology;  Laterality: Left;   CYSTOSCOPY WITH RETROGRADE PYELOGRAM, URETEROSCOPY AND STENT PLACEMENT Right 10/03/2021   Procedure: CYSTOSCOPY WITH RETROGRADE PYELOGRAM, URETEROSCOPY,STONE EXTRACTION AND STENT PLACEMENT;  Surgeon: Rene Paci, MD;  Location: [facility];  Service:  Urology;  Laterality: Right;   DILATATION & CURRETTAGE/HYSTEROSCOPY WITH RESECTOCOPE N/A 01/21/2013   Procedure: DILATATION & CURETTAGE/HYSTEROSCOPY WITH RESECTOCOPE AND RESECTION OF ENDOMETRIAL;  Surgeon: Gretta Cool, MD;  [facility];  Service: Gynecology;  Laterality: N/A;   HARDWARE REMOVAL N/A 06/05/2022   Procedure: Removal of Posterior Cervical Hardware from Occiput to Cervical Five;  Surgeon: Jadene Pierini, MD;  Location: [facility];  Service: Neurosurgery;  Laterality: N/A;   IR  IMAGING GUIDED PORT INSERTION  02/01/2022   KNEE ARTHROSCOPY     left    POLYPECTOMY     POSTERIOR CERVICAL FUSION/FORAMINOTOMY N/A 08/25/2021   Procedure: Occiput to Cervical 5 Posterior cervical instrumented fusion with open biopsy of Cervical 2 Mass. Cervical Two Ganglionectomy;  Surgeon: Jadene Pierini, MD;  Location: [facility];  Service: Neurosurgery;  Laterality: N/A;   ROBOT ASSISTED PYELOPLASTY Left 11/22/2018   Procedure: XI ROBOTIC ASSISTED ATTEMPTED PYELOPLASTY CONVERTED TO NEPHRECTOMY/LYSIS OF ADHESIONS/UMBILICAL HERNIA REPAIR;  Surgeon: Crist Fat, MD;  Location: [facility];  Service: Urology;  Laterality: Left;   ROBOTIC ASSITED PARTIAL NEPHRECTOMY Left 06/11/2015   Procedure: LEFT ROBOTIC ASSISTED LAPAROSCOPIC  PARTIAL NEPHRECTOMY;  Surgeon: Crist Fat, MD;  Location: [facility];  Service: Urology;  Laterality: Left;   ROTATOR CUFF REPAIR Left 2011   URETERAL BIOPSY Left 02/14/2017   Procedure: URETERAL BIOPSY;  Surgeon: Crist Fat, MD;  Location: [facility];  Service: Urology;  Laterality: Left;   URETEROSCOPY WITH HOLMIUM LASER LITHOTRIPSY Left 02/14/2017   Procedure: URETEROSCOPY LITHOTRIPSY, URETERAL BALLOON DILATION;  Surgeon: Crist Fat, MD;  Location: [facility];  Service: Urology;  Laterality: Left;  With STENT    SOCIAL HISTORY: Social History   Socioeconomic History   Marital status: Married    Spouse name: Not on file   Number of children: Not on file   Years of education: Not on file   Highest education level: Not on file  Occupational History   Not on file  Tobacco Use   Smoking status: Never   Smokeless tobacco: Never  Vaping Use   Vaping status: Never Used  Substance and Sexual Activity   Alcohol use: Yes    Comment: occ   Drug use: No   Sexual activity: Not on file  Other Topics Concern   Not on file  Social History Narrative   Not on file   Social Determinants of Health   Financial Resource Strain: Low Risk  (01/04/2023)    Received from [facility], [facility]   Overall Financial Resource Strain (CARDIA)    Difficulty of Paying Living Expenses: Not hard at all  Food Insecurity: No Food Insecurity (01/04/2023)   Received [facility], [facility]   Hunger Vital Sign    Worried About Running Out of Food in the Last Year: Never true    Ran Out of Food in the Last Year: Never true  Transportation Needs: No Transportation Needs (01/04/2023)   Received from [facility], [facility]   PRAPARE - Transportation    Lack of Transportation (Medical): No    Lack of Transportation (Non-Medical): No  Physical Activity: Sufficiently Active (08/07/2022)   Received from [facility], [facility]   Exercise Vital Sign    Days of Exercise per Week: 3 days    Minutes of Exercise per Session: 60 min  Stress: No Stress Concern Present (10/18/2022)   Received from [facility], [facility] -  Occupational Stress Questionnaire    Feeling of Stress : Only a little  Recent Concern: Stress - Stress Concern Present (08/07/2022)   Received from [facility] - Occupational Stress Questionnaire    Feeling of Stress : To some extent  Social Connections: Unknown (08/11/2023)   Received from [facility]   Social Network    Social Network: Not on file  Intimate Partner Violence: Unknown (08/11/2023)   Received from [facility]   HITS    Physically Hurt: Not on file    Insult or Talk Down To: Not on file    Threaten Physical Harm: Not on file    Scream or Curse: Not on file    FAMILY HISTORY: Family History  Problem Relation Age of Onset   Cancer Father     ALLERGIES:  is allergic to morphine.  MEDICATIONS:  Current Outpatient Medications  Medication Sig Dispense Refill   Ascorbic Acid (VITAMIN C ADULT GUMMIES PO) Take 1 tablet by mouth daily.     ASPIRIN LOW DOSE 81 MG tablet TAKE 1 TABLET (81 MG TOTAL) BY MOUTH  DAILY. SWALLOW WHOLE. 90 tablet 3   atorvastatin (LIPITOR) 20 MG tablet Take 20 mg by mouth at bedtime.     Calcium Carbonate-Vitamin D 600-5 MG-MCG CAPS Take 1 capsule by mouth every morning.     Cholecalciferol (VITAMIN D3 PO) Take by mouth.     Cyanocobalamin (VITAMIN B12) 1000 MCG TBCR Take 1 tablet by mouth every morning.     furosemide (LASIX) 20 MG tablet Take 20 mg by mouth daily.     KLOR-CON M20 20 MEQ tablet TAKE 1 TABLET BY MOUTH EVERY DAY 30 tablet 1   lenalidomide (REVLIMID) 5 MG capsule Take 1 capsule (5 mg total) by mouth daily. Siri Cole #[id number]   Date Obtained 10/15/274 Take 1 capsule daily for 21 days then none for 7 days 21 capsule 0   levothyroxine (SYNTHROID) 50 MCG tablet Take 50 mcg by mouth daily before breakfast.     lidocaine-prilocaine (EMLA) cream Apply 1 Application topically as needed. 30 g 0   Magnesium Hydroxide (MILK OF MAGNESIA PO) Take 15 mLs by mouth daily as needed (constipation/diarrhea).     ondansetron (ZOFRAN) 8 MG tablet Take 1 tablet (8 mg total) by mouth every 8 (eight) hours as needed. 30 tablet 0   OZEMPIC, 0.25 OR 0.5 MG/DOSE, 2 MG/1.5ML SOPN Inject 0.5 mg into the skin every Saturday.     prednisoLONE acetate (PRED FORTE) 1 % ophthalmic suspension Place 1 drop into the left eye 4 (four) times daily.     PROAIR HFA 108 (90 BASE) MCG/ACT inhaler Inhale 2 puffs into the lungs every 4 (four) hours as needed for wheezing or shortness of breath. Uses seasonal  3   prochlorperazine (COMPAZINE) 10 MG tablet Take 1 tablet (10 mg total) by mouth every 6 (six) hours as needed for nausea or vomiting. 30 tablet 0   PROLENSA 0.07 % SOLN Place 1 drop into the right eye at bedtime.     Sennosides (SENOKOT PO) Take 1 tablet by mouth daily as needed (constipation). Bid     No current facility-administered medications for this visit.    REVIEW OF SYSTEMS:   Constitutional: ( - ) fevers, ( - )  chills , ( - ) night sweats Eyes: ( - ) blurriness of vision, ( -  ) double vision, ( - ) watery eyes Ears, nose, mouth, throat, and  face: ( - ) mucositis, ( - ) sore throat Respiratory: ( - ) cough, ( - ) dyspnea, ( - ) wheezes Cardiovascular: ( - ) palpitation, ( - ) chest discomfort, ( - ) lower extremity swelling Gastrointestinal:  ( - ) nausea, ( - ) heartburn, ( -) change in bowel habits Skin: ( - ) abnormal skin rashes Lymphatics: ( - ) new lymphadenopathy, ( - ) easy bruising Neurological: ( - ) numbness, ( - ) tingling, ( - ) new weaknesses Behavioral/Psych: ( - ) mood change, ( - ) new changes  All other systems were reviewed with the patient and are negative.  PHYSICAL EXAMINATION: ECOG PERFORMANCE STATUS: 1 - Symptomatic but completely ambulatory  Vitals:   09/07/23 0817  BP: 130/69  Pulse: 65  Resp: 15  SpO2: 100%    Filed Weights   09/07/23 0817  Weight: 211 lb 6.4 oz (95.9 kg)     GENERAL: Well-appearing middle-age Caucasian female alert, no distress and comfortable SKIN: No evidence of rash, erythema, dry skin, or raised lesions. EYES: conjunctiva are pink and non-injected, sclera clear LUNGS: clear to auscultation and percussion with normal breathing effort HEART: regular rate & rhythm and no murmurs and no lower extremity edema Musculoskeletal: no cyanosis of digits and no clubbing  PSYCH: alert & oriented x 3, fluent speech NEURO: no focal motor/sensory deficits  LABORATORY DATA:  I have reviewed the data as listed    Latest Ref Rng & Units 09/07/2023    7:44 AM 08/10/2023    7:54 AM 07/13/2023    8:23 AM  CBC  WBC 4.0 - 10.5 K/uL 2.2  2.4  2.5   Hemoglobin 12.0 - 15.0 g/dL 88.4  16.6  06.3   Hematocrit 36.0 - 46.0 % 36.0  38.2  36.7   Platelets 150 - 400 K/uL 113  112  141        Latest Ref Rng & Units 09/07/2023    7:44 AM 08/10/2023    7:54 AM 07/13/2023    8:23 AM  CMP  Glucose 70 - 99 mg/dL 016  010  932   BUN 8 - 23 mg/dL 16  15  20    Creatinine 0.44 - 1.00 mg/dL 3.55  7.32  2.02   Sodium 135 - 145  mmol/L 142  143  142   Potassium 3.5 - 5.1 mmol/L 3.8  4.0  3.8   Chloride 98 - 111 mmol/L 110  110  110   CO2 22 - 32 mmol/L 25  26  25    Calcium 8.9 - 10.3 mg/dL 8.5  8.6  8.7   Total Protein 6.5 - 8.1 g/dL 6.2  6.4  6.2   Total Bilirubin 0.3 - 1.2 mg/dL 0.9  0.8  0.7   Alkaline Phos 38 - 126 U/L 61  79  81   AST 15 - 41 U/L 13  14  15    ALT 0 - 44 U/L 12  18  14      Lab Results  Component Value Date   MPROTEIN Not Observed 08/10/2023   MPROTEIN Not Observed 07/13/2023   MPROTEIN Not Observed 06/07/2023   Lab Results  Component Value Date   KPAFRELGTCHN 18.0 08/10/2023   KPAFRELGTCHN 17.3 07/13/2023   KPAFRELGTCHN 10.4 06/07/2023   LAMBDASER 15.2 08/10/2023   LAMBDASER 16.5 07/13/2023   LAMBDASER 10.3 06/07/2023   KAPLAMBRATIO 1.18 08/10/2023   KAPLAMBRATIO 1.05 07/13/2023   KAPLAMBRATIO 1.01 06/07/2023    RADIOGRAPHIC STUDIES: No results  found.  ASSESSMENT & PLAN KENNADEE KULIKOWSKI is a 67 y.o. female with medical history significant for multiple myeloma with plasmacytoma who presents for a follow up visit.   # Free Kappa Multiple Myeloma # Plasmacytoma  -- original finding of L2 plasmacytoma on biopsy of back lesion, bone marrow biopsy confirms greater than 60% plasma cells consistent with multiple myeloma. --Cycle 1 Day 1 of VRD treatment started on 10/26/2021.  --VRD therapy stopped on 11/25/2021 due to rash. Started Dara/Rev/Dex on 12/16/2021.  --will order monthly SPEP, UPEP, SFLC and beta 2 microglobulin --weekly CBC, CMP, and LDH --Cycle 1 Day 1 of Kd started on 01/06/2022 Plan:  --Labs show white blood cell 2.2, hemoglobin 12.9, MCV 85.7, and platelets of 113 --Most recent myeloma labs from 08/10/2023 showed no M-protein was undetectable with normal serum free light chains. Continue monthly protein labs with SPEP and serum free light chains. --due to worsening neutropenia, we will dose reduce Revlimid from 5 mg to 2.5 mg PO daily, 21 days on 7 days off.  --Neutropenic  precautions given including monitoring for fevers and chills.  --RTC in 4 weeks for clinic visit/labs  #Neck pain: --Present for the last month involving more the left lateral side with decreased mobility --CT cervical spine did not show any acute abnormalities on 04/19/2023.   #Supportive Care -- chemotherapy education complete  -- port placement complete. -- sent prescription for EMLA cream -- zofran 8mg  q8H PRN and compazine 10mg  PO q6H for nausea -- Started zometa on 02/03/2022. Will continue q 3 months.HOLD zometa in June 2023 due to upcoming spine surgery. Last dose on 07/28/2022. Plan to restart today (August 2024) -- no pain medication required at this time.   No orders of the defined types were placed in this encounter.  All questions were answered. The patient knows to call the clinic with any problems, questions or concerns.  I have spent a total of 30 minutes minutes of face-to-face and non-face-to-face time, preparing to see the patient, performing a medically appropriate examination, counseling and educating the patient, ordering medications/tests, documenting clinical information in the electronic health record, and care coordination.   Georga Kaufmann PA-C Dept of Hematology and Oncology [facility] Phone: [phone]  09/07/2023 8:53 AM

## 2023-09-10 LAB — KAPPA/LAMBDA LIGHT CHAINS
Kappa free light chain: 17.8 mg/L (ref 3.3–19.4)
Kappa, lambda light chain ratio: 1.16 (ref 0.26–1.65)
Lambda free light chains: 15.3 mg/L (ref 5.7–26.3)

## 2023-09-11 LAB — MULTIPLE MYELOMA PANEL, SERUM
Albumin SerPl Elph-Mcnc: 3.5 g/dL (ref 2.9–4.4)
Albumin/Glob SerPl: 1.6 (ref 0.7–1.7)
Alpha 1: 0.2 g/dL (ref 0.0–0.4)
Alpha2 Glob SerPl Elph-Mcnc: 0.5 g/dL (ref 0.4–1.0)
B-Globulin SerPl Elph-Mcnc: 1 g/dL (ref 0.7–1.3)
Gamma Glob SerPl Elph-Mcnc: 0.5 g/dL (ref 0.4–1.8)
Globulin, Total: 2.3 g/dL (ref 2.2–3.9)
IgA: 110 mg/dL (ref 87–352)
IgG (Immunoglobin G), Serum: 604 mg/dL (ref 586–1602)
IgM (Immunoglobulin M), Srm: 36 mg/dL (ref 26–217)
Total Protein ELP: 5.8 g/dL — ABNORMAL LOW (ref 6.0–8.5)

## 2023-09-17 ENCOUNTER — Other Ambulatory Visit: Payer: Self-pay | Admitting: *Deleted

## 2023-09-17 MED ORDER — LENALIDOMIDE 2.5 MG PO CAPS: 2.5000 mg | ORAL_CAPSULE | Freq: Every day | ORAL | 0 refills | Status: DC

## 2023-10-05 ENCOUNTER — Inpatient Hospital Stay: Payer: BC Managed Care – PPO | Attending: Radiation Oncology | Admitting: Hematology and Oncology

## 2023-10-05 ENCOUNTER — Encounter: Payer: Self-pay | Admitting: Hematology and Oncology

## 2023-10-05 ENCOUNTER — Inpatient Hospital Stay: Payer: BC Managed Care – PPO | Attending: Radiation Oncology

## 2023-10-05 VITALS — BP 118/69 | HR 67 | Temp 97.5°F | Resp 16 | Ht 62.0 in | Wt 208.9 lb

## 2023-10-05 DIAGNOSIS — Z79899 Other long term (current) drug therapy: Secondary | ICD-10-CM | POA: Diagnosis not present

## 2023-10-05 DIAGNOSIS — T451X5A Adverse effect of antineoplastic and immunosuppressive drugs, initial encounter: Secondary | ICD-10-CM

## 2023-10-05 DIAGNOSIS — C9001 Multiple myeloma in remission: Secondary | ICD-10-CM

## 2023-10-05 DIAGNOSIS — C9000 Multiple myeloma not having achieved remission: Secondary | ICD-10-CM | POA: Diagnosis present

## 2023-10-05 DIAGNOSIS — Z95828 Presence of other vascular implants and grafts: Secondary | ICD-10-CM

## 2023-10-05 DIAGNOSIS — D6481 Anemia due to antineoplastic chemotherapy: Secondary | ICD-10-CM | POA: Diagnosis not present

## 2023-10-05 LAB — CBC WITH DIFFERENTIAL (CANCER CENTER ONLY)
Abs Immature Granulocytes: 0.01 10*3/uL (ref 0.00–0.07)
Basophils Absolute: 0 10*3/uL (ref 0.0–0.1)
Basophils Relative: 1 %
Eosinophils Absolute: 0.1 10*3/uL (ref 0.0–0.5)
Eosinophils Relative: 3 %
HCT: 39 % (ref 36.0–46.0)
Hemoglobin: 13.7 g/dL (ref 12.0–15.0)
Immature Granulocytes: 0 %
Lymphocytes Relative: 38 %
Lymphs Abs: 0.9 10*3/uL (ref 0.7–4.0)
MCH: 30.6 pg (ref 26.0–34.0)
MCHC: 35.1 g/dL (ref 30.0–36.0)
MCV: 87.1 fL (ref 80.0–100.0)
Monocytes Absolute: 0.2 10*3/uL (ref 0.1–1.0)
Monocytes Relative: 10 %
Neutro Abs: 1.2 10*3/uL — ABNORMAL LOW (ref 1.7–7.7)
Neutrophils Relative %: 48 %
Platelet Count: 150 10*3/uL (ref 150–400)
RBC: 4.48 MIL/uL (ref 3.87–5.11)
RDW: 14.5 % (ref 11.5–15.5)
WBC Count: 2.5 10*3/uL — ABNORMAL LOW (ref 4.0–10.5)
nRBC: 0 % (ref 0.0–0.2)

## 2023-10-05 LAB — CMP (CANCER CENTER ONLY)
ALT: 11 U/L (ref 0–44)
AST: 15 U/L (ref 15–41)
Albumin: 4.3 g/dL (ref 3.5–5.0)
Alkaline Phosphatase: 64 U/L (ref 38–126)
Anion gap: 8 (ref 5–15)
BUN: 17 mg/dL (ref 8–23)
CO2: 25 mmol/L (ref 22–32)
Calcium: 9 mg/dL (ref 8.9–10.3)
Chloride: 107 mmol/L (ref 98–111)
Creatinine: 1.02 mg/dL — ABNORMAL HIGH (ref 0.44–1.00)
GFR, Estimated: >60 mL/min (ref >60)
Glucose, Bld: 115 mg/dL — ABNORMAL HIGH (ref 70–99)
Potassium: 3.7 mmol/L (ref 3.5–5.1)
Sodium: 140 mmol/L (ref 135–145)
Total Bilirubin: 1.3 mg/dL — ABNORMAL HIGH (ref <1.2)
Total Protein: 6.7 g/dL (ref 6.5–8.1)

## 2023-10-05 LAB — LACTATE DEHYDROGENASE: LDH: 211 U/L — ABNORMAL HIGH (ref 98–192)

## 2023-10-05 MED ORDER — HEPARIN SOD (PORK) LOCK FLUSH 100 UNIT/ML IV SOLN
500.0000 [IU] | Freq: Once | INTRAVENOUS | Status: AC
  Administered 2023-10-05: 500 [IU]

## 2023-10-05 MED ORDER — SODIUM CHLORIDE 0.9% FLUSH
10.0000 mL | Freq: Once | INTRAVENOUS | Status: AC
  Administered 2023-10-05: 10 mL

## 2023-10-05 NOTE — Progress Notes (Signed)
[facility] [phone]   [phone]  PROGRESS NOTE  Patient Care Team: Barbarann Ehlers as PCP - General (Physician Assistant)  Hematological/Oncological History # Plasmacytoma with Multiple Myeloma  07/29/2021: CT cervical spine shows expansile lytic lesion which almost completely replaces the normal C2 vertebral body  08/24/2021: PET CT scan shows increased metabolic activity at C2 and C7  08/25/2021: biopsy of C2 lesion showed finding consistent with plasma cell neoplasm.  09/08/2021: start radiation therapy 09/12/2021: establish care with Dr. Leonides Schanz  09/28/2021: end radiation therapy 10/10/2021: Bone marrow biopsy. Results confirm plasma cell neoplasm consistent with multiple myeloma, kappa restricted.  10/26/2021: Cycle 1 Day 1 of VRD 11/18/2021: Cycle 2 Day 1 of VRD 11/25/2021: Velcade HELD due to full body rash. Resolved with steroid therapy.  12/16/2021: Cycle 1 Day 1 of Dara/Rev/Dex 12/30/2021: HELD Daratumumab due to toxicity. Patient unable to tolerate. Requested holding the medication  01/06/2022: Cycle 1 Day 1 of Kd  02/03/2022: Cycle 2 Day 1 of Kd 03/03/2022: Cycle 3 Day 1 of Kd 03/31/2022: Cycle 4 Day 1 of Kd 04/28/2022: Cycle 5 Day 1 of Kd 05/26/2022: Cycle 6 Day 1 of Kd  06/30/2022: Cycle 7 Day 1 of Kd  07/20/2022: Cycle 8 Day 1 of Kd  Sept 2023: start of maintenance revlimid 10 mg PO daily 21 of 28 day cycles.  01/05/2023: HELD Revlimid due to cytopenias. 06/07/2023: restart maintenance revlimid 5 mg PO daily 21 days of 28 days.  09/07/2023: Dose reduce maintenance revlimid to 2.5 mg PO daily 21 days on 7 days off.   Interval History:  Carol [name] 67 y.o. female with medical history significant for multiple myeloma with plasmacytoma who presents for a follow up visit. The patient's last visit was on 09/07/2023.  In the interim since her last visit she has continued on revlimid.    On exam today Carol [name] reports she has been well  overall in the room since her last visit.  She continues taking her Revlimid 2.5 mg daily, 21 days of 28-day cycle.  She reports that she started 1.5 weeks ago.  She notes that it was about 3-week.  Before she can actually get her hands on the medication.  She is not noticing any side effects.  She reports that she is taking her Lasix pills and used to urinate 4-5 times per day but now feels like she is only down to about 2.  She notes that today is her last full day of work and she looks forward to going down to part-time 20 hours/week.  She notes that her energy levels are so-so but she really cannot complain.  She notes that she has not had any infectious symptoms such as runny nose, sore throat, or cough.  She reports she is received all 3 of her vaccines for COVID, RSV, and flu.. She denies fevers, chills, sweats, shortness of breath, chest pain or cough. She has no other complaints. Rest of the ROS is below.    MEDICAL HISTORY:  Past Medical History:  Diagnosis Date   Allergy    Anemia    Ankle edema    both ankles   Arthritis    hands,knees,elbows   Cancer (HCC) 2016   tumor kidney   Diabetes mellitus    type 2    Eczema    History of kidney stones    Hypothyroidism    Obesity    Sleep apnea    cpap machine setting of  3   Thyroid disease     SURGICAL HISTORY: Past Surgical History:  Procedure Laterality Date   BREAST BIOPSY Right 2001   CHOLECYSTECTOMY  2009   open   COLONOSCOPY     CYSTOSCOPY W/ RETROGRADES Left 11/22/2018   Procedure: CYSTOSCOPY WITH RETROGRADE PYELOGRAM;  Surgeon: Crist Fat, MD;  Location: [facility];  Service: Urology;  Laterality: Left;   CYSTOSCOPY WITH RETROGRADE PYELOGRAM, URETEROSCOPY AND STENT PLACEMENT Right 10/03/2021   Procedure: CYSTOSCOPY WITH RETROGRADE PYELOGRAM, URETEROSCOPY,STONE EXTRACTION AND STENT PLACEMENT;  Surgeon: Rene Paci, MD;  Location: [facility];  Service: Urology;  Laterality: Right;   DILATATION &  CURRETTAGE/HYSTEROSCOPY WITH RESECTOCOPE N/A 01/21/2013   Procedure: DILATATION & CURETTAGE/HYSTEROSCOPY WITH RESECTOCOPE AND RESECTION OF ENDOMETRIAL;  Surgeon: Gretta Cool, MD;  Location[facility];  Service: Gynecology;  Laterality: N/A;   HARDWARE REMOVAL N/A 06/05/2022   Procedure: Removal of Posterior Cervical Hardware from Occiput to Cervical Five;  Surgeon: Jadene Pierini, MD;  Location: [facility];  Service: Neurosurgery;  Laterality: N/A;   IR IMAGING GUIDED PORT INSERTION  02/01/2022   KNEE ARTHROSCOPY     left    POLYPECTOMY     POSTERIOR CERVICAL FUSION/FORAMINOTOMY N/A 08/25/2021   Procedure: Occiput to Cervical 5 Posterior cervical instrumented fusion with open biopsy of Cervical 2 Mass. Cervical Two Ganglionectomy;  Surgeon: Jadene Pierini, MD;  Location: [facility];  Service: Neurosurgery;  Laterality: N/A;   ROBOT ASSISTED PYELOPLASTY Left 11/22/2018   Procedure: XI ROBOTIC ASSISTED ATTEMPTED PYELOPLASTY CONVERTED TO NEPHRECTOMY/LYSIS OF ADHESIONS/UMBILICAL HERNIA REPAIR;  Surgeon: Crist Fat, MD;  Location: [facility];  Service: Urology;  Laterality: Left;   ROBOTIC ASSITED PARTIAL NEPHRECTOMY Left 06/11/2015   Procedure: LEFT ROBOTIC ASSISTED LAPAROSCOPIC  PARTIAL NEPHRECTOMY;  Surgeon: Crist Fat, MD;  Location: [facility];  Service: Urology;  Laterality: Left;   ROTATOR CUFF REPAIR Left 2011   URETERAL BIOPSY Left 02/14/2017   Procedure: URETERAL BIOPSY;  Surgeon: Crist Fat, MD;  Location: [facility];  Service: Urology;  Laterality: Left;   URETEROSCOPY WITH HOLMIUM LASER LITHOTRIPSY Left 02/14/2017   Procedure: URETEROSCOPY LITHOTRIPSY, URETERAL BALLOON DILATION;  Surgeon: Crist Fat, MD;  Location: [facility];  Service: Urology;  Laterality: Left;  With STENT    SOCIAL HISTORY: Social History   Socioeconomic History   Marital status: Married    Spouse name: Not on file   Number of children: Not on file   Years of education: Not on file    Highest education level: Not on file  Occupational History   Not on file  Tobacco Use   Smoking status: Never   Smokeless tobacco: Never  Vaping Use   Vaping status: Never Used  Substance and Sexual Activity   Alcohol use: Yes    Comment: occ   Drug use: No   Sexual activity: Not on file  Other Topics Concern   Not on file  Social History Narrative   Not on file   Social Determinants of Health   Financial Resource Strain: Low Risk  (01/04/2023)   Received from [facility], [facility]   Overall Financial Resource Strain (CARDIA)    Difficulty of Paying Living Expenses: Not hard at all  Food Insecurity: No Food Insecurity (01/04/2023)   Received from [facility], [facility]   Hunger Vital Sign    Worried About Running Out of Food in the Last Year: Never true    Ran Out of Food  in the Last Year: Never true  Transportation Needs: No Transportation Needs (01/04/2023)   Received from [facility], [facility]   [facility] - Transportation    Lack of Transportation (Medical): No    Lack of Transportation (Non-Medical): No  Physical Activity: Sufficiently Active (08/07/2022)   Received from [facility], [facility]   Exercise Vital Sign    Days of Exercise per Week: 3 days    Minutes of Exercise per Session: 60 min  Stress: No Stress Concern Present (10/18/2022)   Received from [facility], [facility] - Occupational Stress Questionnaire    Feeling of Stress : Only a little  Recent Concern: Stress - Stress Concern Present (08/07/2022)   Received from [facility] of Occupational Health - Occupational Stress Questionnaire    Feeling of Stress : To some extent  Social Connections: Unknown (08/11/2023)   Received from [facility]   Social Network    Social Network: Not on file  Intimate Partner Violence: Unknown (08/11/2023)   Received from [facility]   HITS    Physically Hurt: Not on file     Insult or Talk Down To: Not on file    Threaten Physical Harm: Not on file    Scream or Curse: Not on file    FAMILY HISTORY: Family History  Problem Relation Age of Onset   Cancer Father     ALLERGIES:  is allergic to morphine.  MEDICATIONS:  Current Outpatient Medications  Medication Sig Dispense Refill   Ascorbic Acid (VITAMIN C ADULT GUMMIES PO) Take 1 tablet by mouth daily.     ASPIRIN LOW DOSE 81 MG tablet TAKE 1 TABLET (81 MG TOTAL) BY MOUTH DAILY. SWALLOW WHOLE. 90 tablet 3   atorvastatin (LIPITOR) 20 MG tablet Take 20 mg by mouth at bedtime.     Calcium Carbonate-Vitamin D 600-5 MG-MCG CAPS Take 1 capsule by mouth every morning.     Cholecalciferol (VITAMIN D3 PO) Take by mouth.     Cyanocobalamin (VITAMIN B12) 1000 MCG TBCR Take 1 tablet by mouth every morning.     furosemide (LASIX) 20 MG tablet Take 20 mg by mouth daily.     KLOR-CON M20 20 MEQ tablet TAKE 1 TABLET BY MOUTH EVERY DAY 30 tablet 1   lenalidomide (REVLIMID) 2.5 MG capsule Take 1 capsule (2.5 mg total) by mouth daily. Siri Cole #  ?[id number]    Date Obtained 09/04/23 Take 1 capsule by mouth daily for 21 days then none for 7 days. 21 capsule 0   levothyroxine (SYNTHROID) 50 MCG tablet Take 50 mcg by mouth daily before breakfast.     lidocaine-prilocaine (EMLA) cream Apply 1 Application topically as needed. 30 g 0   Magnesium Hydroxide (MILK OF MAGNESIA PO) Take 15 mLs by mouth daily as needed (constipation/diarrhea).     ondansetron (ZOFRAN) 8 MG tablet Take 1 tablet (8 mg total) by mouth every 8 (eight) hours as needed. 30 tablet 0   OZEMPIC, 0.25 OR 0.5 MG/DOSE, 2 MG/1.5ML SOPN Inject 0.5 mg into the skin every Saturday.     prednisoLONE acetate (PRED FORTE) 1 % ophthalmic suspension Place 1 drop into the left eye 4 (four) times daily.     PROAIR HFA 108 (90 BASE) MCG/ACT inhaler Inhale 2 puffs into the lungs every 4 (four) hours as needed for wheezing or shortness of breath. Uses seasonal  3    prochlorperazine (COMPAZINE) 10 MG tablet Take  1 tablet (10 mg total) by mouth every 6 (six) hours as needed for nausea or vomiting. 30 tablet 0   PROLENSA 0.07 % SOLN Place 1 drop into the right eye at bedtime.     Sennosides (SENOKOT PO) Take 1 tablet by mouth daily as needed (constipation). Bid     No current facility-administered medications for this visit.    REVIEW OF SYSTEMS:   Constitutional: ( - ) fevers, ( - )  chills , ( - ) night sweats Eyes: ( - ) blurriness of vision, ( - ) double vision, ( - ) watery eyes Ears, nose, mouth, throat, and face: ( - ) mucositis, ( - ) sore throat Respiratory: ( - ) cough, ( - ) dyspnea, ( - ) wheezes Cardiovascular: ( - ) palpitation, ( - ) chest discomfort, ( - ) lower extremity swelling Gastrointestinal:  ( - ) nausea, ( - ) heartburn, ( -) change in bowel habits Skin: ( - ) abnormal skin rashes Lymphatics: ( - ) new lymphadenopathy, ( - ) easy bruising Neurological: ( - ) numbness, ( - ) tingling, ( - ) new weaknesses Behavioral/Psych: ( - ) mood change, ( - ) new changes  All other systems were reviewed with the patient and are negative.  PHYSICAL EXAMINATION: ECOG PERFORMANCE STATUS: 1 - Symptomatic but completely ambulatory  Vitals:   10/05/23 0817  BP: 118/69  Pulse: 67  Resp: 16  Temp: (!) 97.5 F (36.4 C)  SpO2: 99%     Filed Weights   10/05/23 0817  Weight: 208 lb 14.4 oz (94.8 kg)      GENERAL: Well-appearing middle-age Caucasian female alert, no distress and comfortable SKIN: No evidence of rash, erythema, dry skin, or raised lesions. EYES: conjunctiva are pink and non-injected, sclera clear LUNGS: clear to auscultation and percussion with normal breathing effort HEART: regular rate & rhythm and no murmurs and no lower extremity edema Musculoskeletal: no cyanosis of digits and no clubbing  PSYCH: alert & oriented x 3, fluent speech NEURO: no focal motor/sensory deficits  LABORATORY DATA:  I have reviewed the  data as listed    Latest Ref Rng & Units 10/05/2023    7:51 AM 09/07/2023    7:44 AM 08/10/2023    7:54 AM  CBC  WBC 4.0 - 10.5 K/uL 2.5  2.2  2.4   Hemoglobin 12.0 - 15.0 g/dL 16.1  09.6  04.5   Hematocrit 36.0 - 46.0 % 39.0  36.0  38.2   Platelets 150 - 400 K/uL 150  113  112        Latest Ref Rng & Units 10/05/2023    7:51 AM 09/07/2023    7:44 AM 08/10/2023    7:54 AM  CMP  Glucose 70 - 99 mg/dL 409  811  914   BUN 8 - 23 mg/dL 17  16  15    Creatinine 0.44 - 1.00 mg/dL 7.82  9.56  2.13   Sodium 135 - 145 mmol/L 140  142  143   Potassium 3.5 - 5.1 mmol/L 3.7  3.8  4.0   Chloride 98 - 111 mmol/L 107  110  110   CO2 22 - 32 mmol/L 25  25  26    Calcium 8.9 - 10.3 mg/dL 9.0  8.5  8.6   Total Protein 6.5 - 8.1 g/dL 6.7  6.2  6.4   Total Bilirubin <1.2 mg/dL 1.3  0.9  0.8   Alkaline Phos 38 - 126 U/L 64  61  79   AST 15 - 41 U/L 15  13  14    ALT 0 - 44 U/L 11  12  18      Lab Results  Component Value Date   MPROTEIN Not Observed 09/07/2023   MPROTEIN Not Observed 08/10/2023   MPROTEIN Not Observed 07/13/2023   Lab Results  Component Value Date   KPAFRELGTCHN 17.8 09/07/2023   KPAFRELGTCHN 18.0 08/10/2023   KPAFRELGTCHN 17.3 07/13/2023   LAMBDASER 15.3 09/07/2023   LAMBDASER 15.2 08/10/2023   LAMBDASER 16.5 07/13/2023   KAPLAMBRATIO 1.16 09/07/2023   KAPLAMBRATIO 1.18 08/10/2023   KAPLAMBRATIO 1.05 07/13/2023    RADIOGRAPHIC STUDIES: No results found.  ASSESSMENT & PLAN Carol [name] is a 67 y.o. female with medical history significant for multiple myeloma with plasmacytoma who presents for a follow up visit.   # Free Kappa Multiple Myeloma # Plasmacytoma  -- original finding of L2 plasmacytoma on biopsy of back lesion, bone marrow biopsy confirms greater than 60% plasma cells consistent with multiple myeloma. --Cycle 1 Day 1 of VRD treatment started on 10/26/2021.  --VRD therapy stopped on 11/25/2021 due to rash. Started Dara/Rev/Dex on 12/16/2021.  --will order  monthly SPEP, UPEP, SFLC and beta 2 microglobulin --weekly CBC, CMP, and LDH --Cycle 1 Day 1 of Kd started on 01/06/2022 Plan:  --Labs show white blood cell 2.5, Hgb 13.7, MCV 87.1, Plt 150 --Most recent myeloma labs from 09/07/2023 showed no M-protein was undetectable with normal serum free light chains. Continue monthly protein labs with SPEP and serum free light chains. --due to worsening neutropenia, we dose reduced Revlimid from 5 mg to 2.5 mg PO daily, 21 days on 7 days off.  --Neutropenic precautions given including monitoring for fevers and chills.  --RTC in 4 weeks for clinic visit/labs  #Neck pain: --Present for the last month involving more the left lateral side with decreased mobility --CT cervical spine did not show any acute abnormalities on 04/19/2023.   #Supportive Care -- chemotherapy education complete  -- port placement complete. -- sent prescription for EMLA cream -- zofran 8mg  q8H PRN and compazine 10mg  PO q6H for nausea -- Started zometa on 02/03/2022. Will continue q 3 months.HOLD zometa in June 2023 due to upcoming spine surgery. Last dose on 07/28/2022. Restarted in August 2024. Due for next dose Nov 2024.  -- no pain medication required at this time.   No orders of the defined types were placed in this encounter.  All questions were answered. The patient knows to call the clinic with any problems, questions or concerns.  I have spent a total of 30 minutes minutes of face-to-face and non-face-to-face time, preparing to see the patient, performing a medically appropriate examination, counseling and educating the patient, ordering medications/tests, documenting clinical information in the electronic health record, and care coordination.   Ulysees Barns, MD Department of Hematology/Oncology [facility] Phone: [phone] Pager: [phone] Email: [email or web address]   10/05/2023 9:17 AM

## 2023-10-08 LAB — KAPPA/LAMBDA LIGHT CHAINS
Kappa free light chain: 15.8 mg/L (ref 3.3–19.4)
Kappa, lambda light chain ratio: 1.03 (ref 0.26–1.65)
Lambda free light chains: 15.4 mg/L (ref 5.7–26.3)

## 2023-10-11 ENCOUNTER — Inpatient Hospital Stay: Payer: BC Managed Care – PPO

## 2023-10-11 VITALS — BP 124/80 | HR 76 | Temp 98.0°F | Resp 16

## 2023-10-11 DIAGNOSIS — Z95828 Presence of other vascular implants and grafts: Secondary | ICD-10-CM

## 2023-10-11 DIAGNOSIS — C9000 Multiple myeloma not having achieved remission: Secondary | ICD-10-CM | POA: Diagnosis not present

## 2023-10-11 MED ORDER — SODIUM CHLORIDE 0.9% FLUSH
10.0000 mL | Freq: Once | INTRAVENOUS | Status: AC | PRN
  Administered 2023-10-11: 10 mL

## 2023-10-11 MED ORDER — ZOLEDRONIC ACID 4 MG/100ML IV SOLN
4.0000 mg | Freq: Once | INTRAVENOUS | Status: AC
Start: 2023-10-11 — End: 2023-10-11
  Administered 2023-10-11: 4 mg via INTRAVENOUS
  Filled 2023-10-11: qty 100

## 2023-10-11 MED ORDER — SODIUM CHLORIDE 0.9 % IV SOLN: Freq: Once | INTRAVENOUS | Status: AC

## 2023-10-11 MED ORDER — HEPARIN SOD (PORK) LOCK FLUSH 100 UNIT/ML IV SOLN
250.0000 [IU] | Freq: Once | INTRAVENOUS | Status: AC | PRN
  Administered 2023-10-11: 500 [IU]

## 2023-10-11 NOTE — Patient Instructions (Signed)

## 2023-10-15 ENCOUNTER — Other Ambulatory Visit: Payer: Self-pay | Admitting: *Deleted

## 2023-10-15 ENCOUNTER — Other Ambulatory Visit: Payer: Self-pay | Admitting: Hematology and Oncology

## 2023-10-15 MED ORDER — LENALIDOMIDE 2.5 MG PO CAPS: 2.5000 mg | ORAL_CAPSULE | Freq: Every day | ORAL | 0 refills | Status: DC

## 2023-11-06 ENCOUNTER — Inpatient Hospital Stay: Payer: BC Managed Care – PPO | Attending: Radiation Oncology | Admitting: Hematology and Oncology

## 2023-11-06 ENCOUNTER — Inpatient Hospital Stay: Payer: BC Managed Care – PPO | Attending: Radiation Oncology

## 2023-11-06 VITALS — BP 143/74 | HR 73 | Temp 97.3°F | Resp 14 | Wt 210.1 lb

## 2023-11-06 DIAGNOSIS — C9001 Multiple myeloma in remission: Secondary | ICD-10-CM

## 2023-11-06 DIAGNOSIS — Z79899 Other long term (current) drug therapy: Secondary | ICD-10-CM | POA: Insufficient documentation

## 2023-11-06 DIAGNOSIS — C9000 Multiple myeloma not having achieved remission: Secondary | ICD-10-CM | POA: Diagnosis present

## 2023-11-06 DIAGNOSIS — Z95828 Presence of other vascular implants and grafts: Secondary | ICD-10-CM

## 2023-11-06 DIAGNOSIS — Z7982 Long term (current) use of aspirin: Secondary | ICD-10-CM | POA: Insufficient documentation

## 2023-11-06 DIAGNOSIS — M542 Cervicalgia: Secondary | ICD-10-CM | POA: Diagnosis not present

## 2023-11-06 LAB — CMP (CANCER CENTER ONLY)
ALT: 13 U/L (ref 0–44)
AST: 14 U/L — ABNORMAL LOW (ref 15–41)
Albumin: 4.4 g/dL (ref 3.5–5.0)
Alkaline Phosphatase: 66 U/L (ref 38–126)
Anion gap: 7 (ref 5–15)
BUN: 15 mg/dL (ref 8–23)
CO2: 31 mmol/L (ref 22–32)
Calcium: 8.8 mg/dL — ABNORMAL LOW (ref 8.9–10.3)
Chloride: 104 mmol/L (ref 98–111)
Creatinine: 1.08 mg/dL — ABNORMAL HIGH (ref 0.44–1.00)
GFR, Estimated: 56 mL/min — ABNORMAL LOW (ref >60)
Glucose, Bld: 104 mg/dL — ABNORMAL HIGH (ref 70–99)
Potassium: 3.2 mmol/L — ABNORMAL LOW (ref 3.5–5.1)
Sodium: 142 mmol/L (ref 135–145)
Total Bilirubin: 1.2 mg/dL — ABNORMAL HIGH (ref <1.2)
Total Protein: 6.4 g/dL — ABNORMAL LOW (ref 6.5–8.1)

## 2023-11-06 LAB — CBC WITH DIFFERENTIAL (CANCER CENTER ONLY)
Abs Immature Granulocytes: 0.01 10*3/uL (ref 0.00–0.07)
Basophils Absolute: 0 10*3/uL (ref 0.0–0.1)
Basophils Relative: 1 %
Eosinophils Absolute: 0.1 10*3/uL (ref 0.0–0.5)
Eosinophils Relative: 4 %
HCT: 37.3 % (ref 36.0–46.0)
Hemoglobin: 13.3 g/dL (ref 12.0–15.0)
Immature Granulocytes: 0 %
Lymphocytes Relative: 33 %
Lymphs Abs: 1.1 10*3/uL (ref 0.7–4.0)
MCH: 30.9 pg (ref 26.0–34.0)
MCHC: 35.7 g/dL (ref 30.0–36.0)
MCV: 86.5 fL (ref 80.0–100.0)
Monocytes Absolute: 0.2 10*3/uL (ref 0.1–1.0)
Monocytes Relative: 7 %
Neutro Abs: 1.8 10*3/uL (ref 1.7–7.7)
Neutrophils Relative %: 55 %
Platelet Count: 134 10*3/uL — ABNORMAL LOW (ref 150–400)
RBC: 4.31 MIL/uL (ref 3.87–5.11)
RDW: 14.5 % (ref 11.5–15.5)
WBC Count: 3.3 10*3/uL — ABNORMAL LOW (ref 4.0–10.5)
nRBC: 0 % (ref 0.0–0.2)

## 2023-11-06 LAB — LACTATE DEHYDROGENASE: LDH: 209 U/L — ABNORMAL HIGH (ref 98–192)

## 2023-11-06 MED ORDER — SODIUM CHLORIDE 0.9% FLUSH
10.0000 mL | Freq: Once | INTRAVENOUS | Status: AC
  Administered 2023-11-06: 10 mL

## 2023-11-06 MED ORDER — HEPARIN SOD (PORK) LOCK FLUSH 100 UNIT/ML IV SOLN
500.0000 [IU] | Freq: Once | INTRAVENOUS | Status: AC
  Administered 2023-11-06: 500 [IU]

## 2023-11-06 NOTE — Progress Notes (Signed)
[facility] [phone]   [phone]  PROGRESS NOTE  Patient Care Team: Barbarann Ehlers as PCP - General (Physician Assistant)  Hematological/Oncological History # Plasmacytoma with Multiple Myeloma  07/29/2021: CT cervical spine shows expansile lytic lesion which almost completely replaces the normal C2 vertebral body  08/24/2021: PET CT scan shows increased metabolic activity at C2 and C7  08/25/2021: biopsy of C2 lesion showed finding consistent with plasma cell neoplasm.  09/08/2021: start radiation therapy 09/12/2021: establish care with Dr. Leonides Schanz  09/28/2021: end radiation therapy 10/10/2021: Bone marrow biopsy. Results confirm plasma cell neoplasm consistent with multiple myeloma, kappa restricted.  10/26/2021: Cycle 1 Day 1 of VRD 11/18/2021: Cycle 2 Day 1 of VRD 11/25/2021: Velcade HELD due to full body rash. Resolved with steroid therapy.  12/16/2021: Cycle 1 Day 1 of Dara/Rev/Dex 12/30/2021: HELD Daratumumab due to toxicity. Patient unable to tolerate. Requested holding the medication  01/06/2022: Cycle 1 Day 1 of Kd  02/03/2022: Cycle 2 Day 1 of Kd 03/03/2022: Cycle 3 Day 1 of Kd 03/31/2022: Cycle 4 Day 1 of Kd 04/28/2022: Cycle 5 Day 1 of Kd 05/26/2022: Cycle 6 Day 1 of Kd  06/30/2022: Cycle 7 Day 1 of Kd  07/20/2022: Cycle 8 Day 1 of Kd  Sept 2023: start of maintenance revlimid 10 mg PO daily 21 of 28 day cycles.  01/05/2023: HELD Revlimid due to cytopenias. 06/07/2023: restart maintenance revlimid 5 mg PO daily 21 days of 28 days.  09/07/2023: Dose reduce maintenance revlimid to 2.5 mg PO daily 21 days on 7 days off.   Interval History:  Carol [name] 67 y.o. female with medical history significant for multiple myeloma with plasmacytoma who presents for a follow up visit. The patient's last visit was on 10/05/2023.  In the interim since her last visit she has continued on revlimid.    On exam today Carol [name] reports she is now wearing her  neck brace more often try to keep her neck up.  She reports that when she sits down for long periods of time she does not need it but when standing it is most helpful.  She reports that she is taking her Revlimid 2.5 mg daily faithfully.  She notes that she is still on her first bottle and is about 3 weeks in.  Her week off is coming up.  She is tolerating it well with no major side effects.  She reports that she is not having any runny nose, sore throat, or cough.  She denies any other infectious symptoms.  Her energy level still are not very good and she reports them is about a 4 or 5 out of 10.  She reports she is also not sleeping particularly well.  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 She denies fevers, chills, sweats, shortness of breath, chest pain or cough. She has no other complaints. Rest of the ROS is below.    MEDICAL HISTORY:  Past Medical History:  Diagnosis Date   Allergy    Anemia    Ankle edema    both ankles   Arthritis    hands,knees,elbows   Cancer (HCC) 2016   tumor kidney   Diabetes mellitus    type 2    Eczema    History of kidney stones    Hypothyroidism    Obesity    Sleep apnea  cpap machine setting of 3   Thyroid disease     SURGICAL HISTORY: Past Surgical History:  Procedure Laterality Date   BREAST BIOPSY Right 2001   CHOLECYSTECTOMY  2009   open   COLONOSCOPY     CYSTOSCOPY W/ RETROGRADES Left 11/22/2018   Procedure: CYSTOSCOPY WITH RETROGRADE PYELOGRAM;  Surgeon: Crist Fat, MD;  Location: [facility];  Service: Urology;  Laterality: Left;   CYSTOSCOPY WITH RETROGRADE PYELOGRAM, URETEROSCOPY AND STENT PLACEMENT Right 10/03/2021   Procedure: CYSTOSCOPY WITH RETROGRADE PYELOGRAM, URETEROSCOPY,STONE EXTRACTION AND STENT PLACEMENT;  Surgeon: Rene Paci, MD;  Location: [facility];  Service: Urology;  Laterality: Right;   DILATATION &  CURRETTAGE/HYSTEROSCOPY WITH RESECTOCOPE N/A 01/21/2013   Procedure: DILATATION & CURETTAGE/HYSTEROSCOPY WITH RESECTOCOPE AND RESECTION OF ENDOMETRIAL;  Surgeon: Gretta Cool, MD;  [facility]: [facility];  Service: Gynecology;  Laterality: N/A;   HARDWARE REMOVAL N/A 06/05/2022   Procedure: Removal of Posterior Cervical Hardware from Occiput to Cervical Five;  Surgeon: Jadene Pierini, MD;  Location[facility];  Service: Neurosurgery;  Laterality: N/A;   IR IMAGING GUIDED PORT INSERTION  02/01/2022   KNEE ARTHROSCOPY     left    POLYPECTOMY     POSTERIOR CERVICAL FUSION/FORAMINOTOMY N/A 08/25/2021   Procedure: Occiput to Cervical 5 Posterior cervical instrumented fusion with open biopsy of Cervical 2 Mass. Cervical Two Ganglionectomy;  Surgeon: Jadene Pierini, MD;  Location: [facility];  Service: Neurosurgery;  Laterality: N/A;   ROBOT ASSISTED PYELOPLASTY Left 11/22/2018   Procedure: XI ROBOTIC ASSISTED ATTEMPTED PYELOPLASTY CONVERTED TO NEPHRECTOMY/LYSIS OF ADHESIONS/UMBILICAL HERNIA REPAIR;  Surgeon: Crist Fat, MD;  Location: [facility];  Service: Urology;  Laterality: Left;   ROBOTIC ASSITED PARTIAL NEPHRECTOMY Left 06/11/2015   Procedure: LEFT ROBOTIC ASSISTED LAPAROSCOPIC  PARTIAL NEPHRECTOMY;  Surgeon: Crist Fat, MD;  Location: [facility];  Service: Urology;  Laterality: Left;   ROTATOR CUFF REPAIR Left 2011   URETERAL BIOPSY Left 02/14/2017   Procedure: URETERAL BIOPSY;  Surgeon: Crist Fat, MD;  Location: [facility];  Service: Urology;  Laterality: Left;   URETEROSCOPY WITH HOLMIUM LASER LITHOTRIPSY Left 02/14/2017   Procedure: URETEROSCOPY LITHOTRIPSY, URETERAL BALLOON DILATION;  Surgeon: Crist Fat, MD;  Location: [facility];  Service: Urology;  Laterality: Left;  With STENT    SOCIAL HISTORY: Social History   Socioeconomic History   Marital status: Married    Spouse name: Not on file   Number of children: Not on file   Years of education: Not on file    Highest education level: Not on file  Occupational History   Not on file  Tobacco Use   Smoking status: Never   Smokeless tobacco: Never  Vaping Use   Vaping status: Never Used  Substance and Sexual Activity   Alcohol use: Yes    Comment: occ   Drug use: No   Sexual activity: Not on file  Other Topics Concern   Not on file  Social History Narrative   Not on file   Social Drivers of Health   Financial Resource Strain: Low Risk  (01/04/2023)   Received from [facility], [facility]   Overall Financial Resource Strain (CARDIA)    Difficulty of Paying Living Expenses: Not hard at all  Food Insecurity: No Food Insecurity (01/04/2023)   Received from [facility], [facility]   Hunger Vital Sign    Worried About Running Out of Food in the Last Year: Never true  Ran Out of Food in the Last Year: Never true  Transportation Needs: No Transportation Needs (01/04/2023)   Received from [facility], [facility]   PRAPARE - Transportation    Lack of Transportation (Medical): No    Lack of Transportation (Non-Medical): No  Physical Activity: Sufficiently Active (08/07/2022)   Received from [facility], [facility]   Exercise Vital Sign    Days of Exercise per Week: 3 days    Minutes of Exercise per Session: 60 min  Stress: No Stress Concern Present (10/18/2022)   Received from [facility], [facility] - Occupational Stress Questionnaire    Feeling of Stress : Only a little  Recent Concern: Stress - Stress Concern Present (08/07/2022)   Received from [facility] Health - Occupational Stress Questionnaire    Feeling of Stress : To some extent  Social Connections: Unknown (08/11/2023)   Received from [facility]   Social Network    Social Network: Not on file  Intimate Partner Violence: Unknown (08/11/2023)   Received from [facility]   HITS    Physically Hurt: Not on file     Insult or Talk Down To: Not on file    Threaten Physical Harm: Not on file    Scream or Curse: Not on file    FAMILY HISTORY: Family History  Problem Relation Age of Onset   Cancer Father     ALLERGIES:  is allergic to morphine.  MEDICATIONS:  Current Outpatient Medications  Medication Sig Dispense Refill   Ascorbic Acid (VITAMIN C ADULT GUMMIES PO) Take 1 tablet by mouth daily.     ASPIRIN LOW DOSE 81 MG tablet TAKE 1 TABLET (81 MG TOTAL) BY MOUTH DAILY. SWALLOW WHOLE. 90 tablet 3   atorvastatin (LIPITOR) 20 MG tablet Take 20 mg by mouth at bedtime.     Calcium Carbonate-Vitamin D 600-5 MG-MCG CAPS Take 1 capsule by mouth every morning.     Cholecalciferol (VITAMIN D3 PO) Take by mouth.     Cyanocobalamin (VITAMIN B12) 1000 MCG TBCR Take 1 tablet by mouth every morning.     furosemide (LASIX) 20 MG tablet Take 20 mg by mouth daily.     KLOR-CON M20 20 MEQ tablet TAKE 1 TABLET BY MOUTH EVERY DAY 30 tablet 1   lenalidomide (REVLIMID) 2.5 MG capsule Take 1 capsule (2.5 mg total) by mouth daily. [employer] Auth # [id number]   Date Obtained 10/15/23 Take 1 capsule by mouth daily for 21 days then none for 7 days. 21 capsule 0   levothyroxine (SYNTHROID) 50 MCG tablet Take 50 mcg by mouth daily before breakfast.     lidocaine-prilocaine (EMLA) cream Apply 1 Application topically as needed. 30 g 0   Magnesium Hydroxide (MILK OF MAGNESIA PO) Take 15 mLs by mouth daily as needed (constipation/diarrhea).     ondansetron (ZOFRAN) 8 MG tablet Take 1 tablet (8 mg total) by mouth every 8 (eight) hours as needed. 30 tablet 0   OZEMPIC, 0.25 OR 0.5 MG/DOSE, 2 MG/1.5ML SOPN Inject 0.5 mg into the skin every Saturday.     prednisoLONE acetate (PRED FORTE) 1 % ophthalmic suspension Place 1 drop into the left eye 4 (four) times daily.     PROAIR HFA 108 (90 BASE) MCG/ACT inhaler Inhale 2 puffs into the lungs every 4 (four) hours as needed for wheezing or shortness of breath. Uses seasonal  3    prochlorperazine (COMPAZINE) 10 MG  tablet Take 1 tablet (10 mg total) by mouth every 6 (six) hours as needed for nausea or vomiting. 30 tablet 0   PROLENSA 0.07 % SOLN Place 1 drop into the right eye at bedtime.     Sennosides (SENOKOT PO) Take 1 tablet by mouth daily as needed (constipation). Bid     No current facility-administered medications for this visit.    REVIEW OF SYSTEMS:   Constitutional: ( - ) fevers, ( - )  chills , ( - ) night sweats Eyes: ( - ) blurriness of vision, ( - ) double vision, ( - ) watery eyes Ears, nose, mouth, throat, and face: ( - ) mucositis, ( - ) sore throat Respiratory: ( - ) cough, ( - ) dyspnea, ( - ) wheezes Cardiovascular: ( - ) palpitation, ( - ) chest discomfort, ( - ) lower extremity swelling Gastrointestinal:  ( - ) nausea, ( - ) heartburn, ( -) change in bowel habits Skin: ( - ) abnormal skin rashes Lymphatics: ( - ) new lymphadenopathy, ( - ) easy bruising Neurological: ( - ) numbness, ( - ) tingling, ( - ) new weaknesses Behavioral/Psych: ( - ) mood change, ( - ) new changes  All other systems were reviewed with the patient and are negative.  PHYSICAL EXAMINATION: ECOG PERFORMANCE STATUS: 1 - Symptomatic but completely ambulatory  Vitals:   11/06/23 1112  BP: (!) 143/74  Pulse: 73  Resp: 14  Temp: (!) 97.3 F (36.3 C)  SpO2: 100%      Filed Weights   11/06/23 1112  Weight: 210 lb 1.6 oz (95.3 kg)       GENERAL: Well-appearing middle-age Caucasian female alert, no distress and comfortable SKIN: No evidence of rash, erythema, dry skin, or raised lesions. EYES: conjunctiva are pink and non-injected, sclera clear LUNGS: clear to auscultation and percussion with normal breathing effort HEART: regular rate & rhythm and no murmurs and no lower extremity edema Musculoskeletal: no cyanosis of digits and no clubbing  PSYCH: alert & oriented x 3, fluent speech NEURO: no focal motor/sensory deficits  LABORATORY DATA:  I have  reviewed the data as listed    Latest Ref Rng & Units 11/06/2023   10:34 AM 10/05/2023    7:51 AM 09/07/2023    7:44 AM  CBC  WBC 4.0 - 10.5 K/uL 3.3  2.5  2.2   Hemoglobin 12.0 - 15.0 g/dL 32.9  51.8  84.1   Hematocrit 36.0 - 46.0 % 37.3  39.0  36.0   Platelets 150 - 400 K/uL 134  150  113        Latest Ref Rng & Units 11/06/2023   10:34 AM 10/05/2023    7:51 AM 09/07/2023    7:44 AM  CMP  Glucose 70 - 99 mg/dL 660  630  160   BUN 8 - 23 mg/dL 15  17  16    Creatinine 0.44 - 1.00 mg/dL 1.09  3.23  5.57   Sodium 135 - 145 mmol/L 142  140  142   Potassium 3.5 - 5.1 mmol/L 3.2  3.7  3.8   Chloride 98 - 111 mmol/L 104  107  110   CO2 22 - 32 mmol/L 31  25  25    Calcium 8.9 - 10.3 mg/dL 8.8  9.0  8.5   Total Protein 6.5 - 8.1 g/dL 6.4  6.7  6.2   Total Bilirubin <1.2 mg/dL 1.2  1.3  0.9   Alkaline Phos 38 -  126 U/L 66  64  61   AST 15 - 41 U/L 14  15  13    ALT 0 - 44 U/L 13  11  12      Lab Results  Component Value Date   MPROTEIN Not Observed 09/07/2023   MPROTEIN Not Observed 08/10/2023   MPROTEIN Not Observed 07/13/2023   Lab Results  Component Value Date   KPAFRELGTCHN 15.8 10/05/2023   KPAFRELGTCHN 17.8 09/07/2023   KPAFRELGTCHN 18.0 08/10/2023   LAMBDASER 15.4 10/05/2023   LAMBDASER 15.3 09/07/2023   LAMBDASER 15.2 08/10/2023   KAPLAMBRATIO 1.03 10/05/2023   KAPLAMBRATIO 1.16 09/07/2023   KAPLAMBRATIO 1.18 08/10/2023    RADIOGRAPHIC STUDIES: No results found.  ASSESSMENT & PLAN YAMELI TRULOCK is a 67 y.o. female with medical history significant for multiple myeloma with plasmacytoma who presents for a follow up visit.   # Free Kappa Multiple Myeloma # Plasmacytoma  -- original finding of L2 plasmacytoma on biopsy of back lesion, bone marrow biopsy confirms greater than 60% plasma cells consistent with multiple myeloma. --Cycle 1 Day 1 of VRD treatment started on 10/26/2021.  --VRD therapy stopped on 11/25/2021 due to rash. Started Dara/Rev/Dex on 12/16/2021.   --will order monthly SPEP, UPEP, SFLC and beta 2 microglobulin --weekly CBC, CMP, and LDH --Cycle 1 Day 1 of Kd started on 01/06/2022 Plan:  --Labs show white blood cell 3.3, hemoglobin 13.3, MCV 86.5, platelets 134 --Most recent myeloma labs from 09/07/2023 showed no M-protein was undetectable with normal serum free light chains. Continue monthly protein labs with SPEP and serum free light chains. --due to worsening neutropenia, we dose reduced Revlimid 2.5 mg PO daily, 21 days on 7 days off.  --Neutropenic precautions given including monitoring for fevers and chills.  ANC is improved today to 1.8. --RTC q 4 weeks for labs and 12 weeks for clinic visit   #Neck pain: --Present for the last month involving more the left lateral side with decreased mobility --CT cervical spine did not show any acute abnormalities on 04/19/2023.   #Supportive Care -- chemotherapy education complete  -- port placement complete. -- sent prescription for EMLA cream -- zofran 8mg  q8H PRN and compazine 10mg  PO q6H for nausea -- Started zometa on 02/03/2022. Will continue q 3 months.HOLD zometa in June 2023 due to upcoming spine surgery. Last dose on 07/28/2022. Restarted in August 2024. Due for next dose Feb 2025.  -- no pain medication required at this time.   No orders of the defined types were placed in this encounter.  All questions were answered. The patient knows to call the clinic with any problems, questions or concerns.  I have spent a total of 30 minutes minutes of face-to-face and non-face-to-face time, preparing to see the patient, performing a medically appropriate examination, counseling and educating the patient, ordering medications/tests, documenting clinical information in the electronic health record, and care coordination.   Ulysees Barns, MD Department of Hematology/Oncology [facility] at [facility] Phone: [phone] Pager: [phone] Email:  [email or web address]   11/06/2023 2:51 PM

## 2023-11-07 ENCOUNTER — Other Ambulatory Visit (HOSPITAL_COMMUNITY): Payer: Self-pay

## 2023-11-07 ENCOUNTER — Telehealth: Payer: Self-pay | Admitting: Pharmacy Technician

## 2023-11-07 LAB — KAPPA/LAMBDA LIGHT CHAINS
Kappa free light chain: 18.9 mg/L (ref 3.3–19.4)
Kappa, lambda light chain ratio: 1.22 (ref 0.26–1.65)
Lambda free light chains: 15.5 mg/L (ref 5.7–26.3)

## 2023-11-07 NOTE — Telephone Encounter (Signed)
Oral Oncology Patient Advocate Encounter  Prior Authorization for lenalidomide has been approved.    PA# [id number] Effective dates: 11/07/23 through 11/06/24  Patient may continue to fill at [facility].    Jinger Neighbors, CPhT-Adv Oncology Pharmacy Patient Advocate [facility] Direct Number: [phone]  Fax: [phone]

## 2023-11-07 NOTE — Telephone Encounter (Signed)
Oral Oncology Patient Advocate Encounter   Received notification that prior authorization for lenalidomide is due for renewal.   PA submitted on 11/07/23 Key [id number] Status is pending     Jinger Neighbors, CPhT-Adv Oncology Pharmacy Patient Advocate [facility] Direct Number: [phone]  Fax: [phone]

## 2023-11-12 ENCOUNTER — Other Ambulatory Visit: Payer: Self-pay | Admitting: Hematology and Oncology

## 2023-11-15 ENCOUNTER — Other Ambulatory Visit: Payer: Self-pay | Admitting: *Deleted

## 2023-11-15 MED ORDER — LENALIDOMIDE 2.5 MG PO CAPS: 2.5000 mg | ORAL_CAPSULE | Freq: Every day | ORAL | 0 refills | Status: DC

## 2023-11-19 ENCOUNTER — Telehealth: Payer: Self-pay | Admitting: *Deleted

## 2023-11-19 NOTE — Telephone Encounter (Signed)
Received call from pt. She states she has an URI-whole family has it. She tested negative for Covid.She has some dental work done and was on Amoxicillin for that. She states she is starting to feel better. She just wanted Dr. Leonides Schanz to know her situation as her WBCs tend to run low on her Revlimid. Advised to see her PCP should she start to feel worse and to let us know as well. Pt voiced understanding.

## 2023-12-06 ENCOUNTER — Inpatient Hospital Stay: Payer: BC Managed Care – PPO | Attending: Radiation Oncology

## 2023-12-06 DIAGNOSIS — C9001 Multiple myeloma in remission: Secondary | ICD-10-CM

## 2023-12-06 DIAGNOSIS — C9000 Multiple myeloma not having achieved remission: Secondary | ICD-10-CM | POA: Diagnosis present

## 2023-12-06 LAB — CMP (CANCER CENTER ONLY)
ALT: 11 U/L (ref 0–44)
AST: 13 U/L — ABNORMAL LOW (ref 15–41)
Albumin: 4.2 g/dL (ref 3.5–5.0)
Alkaline Phosphatase: 66 U/L (ref 38–126)
Anion gap: 11 (ref 5–15)
BUN: 16 mg/dL (ref 8–23)
CO2: 24 mmol/L (ref 22–32)
Calcium: 8.8 mg/dL — ABNORMAL LOW (ref 8.9–10.3)
Chloride: 106 mmol/L (ref 98–111)
Creatinine: 1.14 mg/dL — ABNORMAL HIGH (ref 0.44–1.00)
GFR, Estimated: 52 mL/min — ABNORMAL LOW (ref >60)
Glucose, Bld: 114 mg/dL — ABNORMAL HIGH (ref 70–99)
Potassium: 3.8 mmol/L (ref 3.5–5.1)
Sodium: 141 mmol/L (ref 135–145)
Total Bilirubin: 1.1 mg/dL (ref 0.0–1.2)
Total Protein: 6.4 g/dL — ABNORMAL LOW (ref 6.5–8.1)

## 2023-12-06 LAB — CBC WITH DIFFERENTIAL (CANCER CENTER ONLY)
Abs Immature Granulocytes: 0.01 10*3/uL (ref 0.00–0.07)
Basophils Absolute: 0.1 10*3/uL (ref 0.0–0.1)
Basophils Relative: 2 %
Eosinophils Absolute: 0.1 10*3/uL (ref 0.0–0.5)
Eosinophils Relative: 4 %
HCT: 37.8 % (ref 36.0–46.0)
Hemoglobin: 13.6 g/dL (ref 12.0–15.0)
Immature Granulocytes: 0 %
Lymphocytes Relative: 36 %
Lymphs Abs: 1.2 10*3/uL (ref 0.7–4.0)
MCH: 31.1 pg (ref 26.0–34.0)
MCHC: 36 g/dL (ref 30.0–36.0)
MCV: 86.5 fL (ref 80.0–100.0)
Monocytes Absolute: 0.2 10*3/uL (ref 0.1–1.0)
Monocytes Relative: 7 %
Neutro Abs: 1.7 10*3/uL (ref 1.7–7.7)
Neutrophils Relative %: 51 %
Platelet Count: 155 10*3/uL (ref 150–400)
RBC: 4.37 MIL/uL (ref 3.87–5.11)
RDW: 13.9 % (ref 11.5–15.5)
WBC Count: 3.3 10*3/uL — ABNORMAL LOW (ref 4.0–10.5)
nRBC: 0 % (ref 0.0–0.2)

## 2023-12-06 LAB — LACTATE DEHYDROGENASE: LDH: 189 U/L (ref 98–192)

## 2023-12-07 ENCOUNTER — Other Ambulatory Visit: Payer: Self-pay | Admitting: Hematology and Oncology

## 2023-12-07 ENCOUNTER — Other Ambulatory Visit: Payer: Self-pay | Admitting: *Deleted

## 2023-12-07 LAB — KAPPA/LAMBDA LIGHT CHAINS
Kappa free light chain: 18.2 mg/L (ref 3.3–19.4)
Kappa, lambda light chain ratio: 1.27 (ref 0.26–1.65)
Lambda free light chains: 14.3 mg/L (ref 5.7–26.3)

## 2023-12-07 MED ORDER — LENALIDOMIDE 2.5 MG PO CAPS: 2.5000 mg | ORAL_CAPSULE | Freq: Every day | ORAL | 0 refills | Status: DC

## 2023-12-27 ENCOUNTER — Encounter: Payer: Self-pay | Admitting: Hematology and Oncology

## 2024-01-03 ENCOUNTER — Inpatient Hospital Stay: Payer: BC Managed Care – PPO | Attending: Radiation Oncology

## 2024-01-03 DIAGNOSIS — C9000 Multiple myeloma not having achieved remission: Secondary | ICD-10-CM | POA: Insufficient documentation

## 2024-01-03 DIAGNOSIS — Z95828 Presence of other vascular implants and grafts: Secondary | ICD-10-CM

## 2024-01-03 DIAGNOSIS — C9001 Multiple myeloma in remission: Secondary | ICD-10-CM

## 2024-01-03 LAB — CMP (CANCER CENTER ONLY)
ALT: 14 U/L (ref 0–44)
AST: 13 U/L — ABNORMAL LOW (ref 15–41)
Albumin: 4.2 g/dL (ref 3.5–5.0)
Alkaline Phosphatase: 77 U/L (ref 38–126)
Anion gap: 7 (ref 5–15)
BUN: 13 mg/dL (ref 8–23)
CO2: 25 mmol/L (ref 22–32)
Calcium: 8.9 mg/dL (ref 8.9–10.3)
Chloride: 109 mmol/L (ref 98–111)
Creatinine: 1.09 mg/dL — ABNORMAL HIGH (ref 0.44–1.00)
GFR, Estimated: 55 mL/min — ABNORMAL LOW (ref >60)
Glucose, Bld: 110 mg/dL — ABNORMAL HIGH (ref 70–99)
Potassium: 4 mmol/L (ref 3.5–5.1)
Sodium: 141 mmol/L (ref 135–145)
Total Bilirubin: 1 mg/dL (ref 0.0–1.2)
Total Protein: 6.3 g/dL — ABNORMAL LOW (ref 6.5–8.1)

## 2024-01-03 LAB — CBC WITH DIFFERENTIAL (CANCER CENTER ONLY)
Abs Immature Granulocytes: 0.01 10*3/uL (ref 0.00–0.07)
Basophils Absolute: 0.1 10*3/uL (ref 0.0–0.1)
Basophils Relative: 2 %
Eosinophils Absolute: 0.1 10*3/uL (ref 0.0–0.5)
Eosinophils Relative: 4 %
HCT: 37.6 % (ref 36.0–46.0)
Hemoglobin: 13 g/dL (ref 12.0–15.0)
Immature Granulocytes: 0 %
Lymphocytes Relative: 35 %
Lymphs Abs: 1 10*3/uL (ref 0.7–4.0)
MCH: 30.6 pg (ref 26.0–34.0)
MCHC: 34.6 g/dL (ref 30.0–36.0)
MCV: 88.5 fL (ref 80.0–100.0)
Monocytes Absolute: 0.2 10*3/uL (ref 0.1–1.0)
Monocytes Relative: 8 %
Neutro Abs: 1.4 10*3/uL — ABNORMAL LOW (ref 1.7–7.7)
Neutrophils Relative %: 51 %
Platelet Count: 120 10*3/uL — ABNORMAL LOW (ref 150–400)
RBC: 4.25 MIL/uL (ref 3.87–5.11)
RDW: 13.8 % (ref 11.5–15.5)
WBC Count: 2.7 10*3/uL — ABNORMAL LOW (ref 4.0–10.5)
nRBC: 0 % (ref 0.0–0.2)

## 2024-01-03 LAB — LACTATE DEHYDROGENASE: LDH: 189 U/L (ref 98–192)

## 2024-01-03 MED ORDER — SODIUM CHLORIDE 0.9% FLUSH
10.0000 mL | Freq: Once | INTRAVENOUS | Status: AC
  Administered 2024-01-03: 10 mL

## 2024-01-03 MED ORDER — HEPARIN SOD (PORK) LOCK FLUSH 100 UNIT/ML IV SOLN
500.0000 [IU] | Freq: Once | INTRAVENOUS | Status: AC
  Administered 2024-01-03: 500 [IU]

## 2024-01-04 ENCOUNTER — Other Ambulatory Visit: Payer: Self-pay

## 2024-01-04 LAB — KAPPA/LAMBDA LIGHT CHAINS
Kappa free light chain: 18.7 mg/L (ref 3.3–19.4)
Kappa, lambda light chain ratio: 1.12 (ref 0.26–1.65)
Lambda free light chains: 16.7 mg/L (ref 5.7–26.3)

## 2024-01-07 ENCOUNTER — Other Ambulatory Visit: Payer: Self-pay | Admitting: Hematology and Oncology

## 2024-01-09 ENCOUNTER — Other Ambulatory Visit: Payer: Self-pay | Admitting: *Deleted

## 2024-01-09 MED ORDER — LENALIDOMIDE 2.5 MG PO CAPS: 2.5000 mg | ORAL_CAPSULE | Freq: Every day | ORAL | 0 refills | Status: DC

## 2024-01-31 ENCOUNTER — Other Ambulatory Visit: Payer: Self-pay | Admitting: *Deleted

## 2024-01-31 DIAGNOSIS — C9001 Multiple myeloma in remission: Secondary | ICD-10-CM

## 2024-02-01 ENCOUNTER — Other Ambulatory Visit: Payer: Self-pay | Admitting: Hematology and Oncology

## 2024-02-01 ENCOUNTER — Inpatient Hospital Stay: Payer: BC Managed Care – PPO | Attending: Radiation Oncology

## 2024-02-01 ENCOUNTER — Inpatient Hospital Stay: Payer: BC Managed Care – PPO

## 2024-02-01 ENCOUNTER — Inpatient Hospital Stay (HOSPITAL_BASED_OUTPATIENT_CLINIC_OR_DEPARTMENT_OTHER): Payer: BC Managed Care – PPO | Admitting: Hematology and Oncology

## 2024-02-01 VITALS — BP 136/79 | HR 70 | Temp 97.3°F | Resp 15 | Wt 219.4 lb

## 2024-02-01 DIAGNOSIS — C9000 Multiple myeloma not having achieved remission: Secondary | ICD-10-CM | POA: Diagnosis present

## 2024-02-01 DIAGNOSIS — C9001 Multiple myeloma in remission: Secondary | ICD-10-CM | POA: Diagnosis not present

## 2024-02-01 DIAGNOSIS — Z79899 Other long term (current) drug therapy: Secondary | ICD-10-CM | POA: Insufficient documentation

## 2024-02-01 DIAGNOSIS — T451X5A Adverse effect of antineoplastic and immunosuppressive drugs, initial encounter: Secondary | ICD-10-CM

## 2024-02-01 DIAGNOSIS — D701 Agranulocytosis secondary to cancer chemotherapy: Secondary | ICD-10-CM | POA: Diagnosis not present

## 2024-02-01 DIAGNOSIS — Z95828 Presence of other vascular implants and grafts: Secondary | ICD-10-CM

## 2024-02-01 LAB — CBC WITH DIFFERENTIAL (CANCER CENTER ONLY)
Abs Immature Granulocytes: 0.02 10*3/uL (ref 0.00–0.07)
Basophils Absolute: 0 10*3/uL (ref 0.0–0.1)
Basophils Relative: 1 %
Eosinophils Absolute: 0.1 10*3/uL (ref 0.0–0.5)
Eosinophils Relative: 3 %
HCT: 36.4 % (ref 36.0–46.0)
Hemoglobin: 13.2 g/dL (ref 12.0–15.0)
Immature Granulocytes: 1 %
Lymphocytes Relative: 36 %
Lymphs Abs: 1 10*3/uL (ref 0.7–4.0)
MCH: 31.1 pg (ref 26.0–34.0)
MCHC: 36.3 g/dL — ABNORMAL HIGH (ref 30.0–36.0)
MCV: 85.8 fL (ref 80.0–100.0)
Monocytes Absolute: 0.2 10*3/uL (ref 0.1–1.0)
Monocytes Relative: 7 %
Neutro Abs: 1.5 10*3/uL — ABNORMAL LOW (ref 1.7–7.7)
Neutrophils Relative %: 52 %
Platelet Count: 126 10*3/uL — ABNORMAL LOW (ref 150–400)
RBC: 4.24 MIL/uL (ref 3.87–5.11)
RDW: 13.6 % (ref 11.5–15.5)
WBC Count: 2.8 10*3/uL — ABNORMAL LOW (ref 4.0–10.5)
nRBC: 0 % (ref 0.0–0.2)

## 2024-02-01 LAB — CMP (CANCER CENTER ONLY)
ALT: 13 U/L (ref 0–44)
AST: 14 U/L — ABNORMAL LOW (ref 15–41)
Albumin: 4.2 g/dL (ref 3.5–5.0)
Alkaline Phosphatase: 78 U/L (ref 38–126)
Anion gap: 7 (ref 5–15)
BUN: 16 mg/dL (ref 8–23)
CO2: 26 mmol/L (ref 22–32)
Calcium: 8.6 mg/dL — ABNORMAL LOW (ref 8.9–10.3)
Chloride: 109 mmol/L (ref 98–111)
Creatinine: 1.04 mg/dL — ABNORMAL HIGH (ref 0.44–1.00)
GFR, Estimated: 59 mL/min — ABNORMAL LOW (ref >60)
Glucose, Bld: 117 mg/dL — ABNORMAL HIGH (ref 70–99)
Potassium: 3.7 mmol/L (ref 3.5–5.1)
Sodium: 142 mmol/L (ref 135–145)
Total Bilirubin: 1.2 mg/dL (ref 0.0–1.2)
Total Protein: 6.4 g/dL — ABNORMAL LOW (ref 6.5–8.1)

## 2024-02-01 LAB — LACTATE DEHYDROGENASE: LDH: 182 U/L (ref 98–192)

## 2024-02-01 MED ORDER — SODIUM CHLORIDE 0.9 % IV SOLN: Freq: Once | INTRAVENOUS | Status: AC

## 2024-02-01 MED ORDER — SODIUM CHLORIDE 0.9% FLUSH
3.0000 mL | Freq: Once | INTRAVENOUS | Status: AC | PRN
  Administered 2024-02-01: 3 mL

## 2024-02-01 MED ORDER — ZOLEDRONIC ACID 4 MG/100ML IV SOLN
4.0000 mg | Freq: Once | INTRAVENOUS | Status: AC
Start: 2024-02-01 — End: 2024-02-01
  Administered 2024-02-01: 4 mg via INTRAVENOUS
  Filled 2024-02-01: qty 100

## 2024-02-01 MED ORDER — SODIUM CHLORIDE 0.9% FLUSH
10.0000 mL | Freq: Once | INTRAVENOUS | Status: AC
  Administered 2024-02-01: 10 mL

## 2024-02-01 MED ORDER — HEPARIN SOD (PORK) LOCK FLUSH 100 UNIT/ML IV SOLN
500.0000 [IU] | Freq: Once | INTRAVENOUS | Status: AC | PRN
  Administered 2024-02-01: 500 [IU]

## 2024-02-01 NOTE — Patient Instructions (Signed)

## 2024-02-03 ENCOUNTER — Encounter: Payer: Self-pay | Admitting: Hematology and Oncology

## 2024-02-03 NOTE — Progress Notes (Signed)
 [facility] [phone]   [phone]  PROGRESS NOTE  Patient Care Team: Barbarann Ehlers as PCP - General (Physician Assistant)  Hematological/Oncological History # Plasmacytoma with Multiple Myeloma  07/29/2021: CT cervical spine shows expansile lytic lesion which almost completely replaces the normal C2 vertebral body  08/24/2021: PET CT scan shows increased metabolic activity at C2 and C7  08/25/2021: biopsy of C2 lesion showed finding consistent with plasma cell neoplasm.  09/08/2021: start radiation therapy 09/12/2021: establish care with Dr. Leonides Schanz  09/28/2021: end radiation therapy 10/10/2021: Bone marrow biopsy. Results confirm plasma cell neoplasm consistent with multiple myeloma, kappa restricted.  10/26/2021: Cycle 1 Day 1 of VRD 11/18/2021: Cycle 2 Day 1 of VRD 11/25/2021: Velcade HELD due to full body rash. Resolved with steroid therapy.  12/16/2021: Cycle 1 Day 1 of Dara/Rev/Dex 12/30/2021: HELD Daratumumab due to toxicity. Patient unable to tolerate. Requested holding the medication  01/06/2022: Cycle 1 Day 1 of Kd  02/03/2022: Cycle 2 Day 1 of Kd 03/03/2022: Cycle 3 Day 1 of Kd 03/31/2022: Cycle 4 Day 1 of Kd 04/28/2022: Cycle 5 Day 1 of Kd 05/26/2022: Cycle 6 Day 1 of Kd  06/30/2022: Cycle 7 Day 1 of Kd  07/20/2022: Cycle 8 Day 1 of Kd  Sept 2023: start of maintenance revlimid 10 mg PO daily 21 of 28 day cycles.  01/05/2023: HELD Revlimid due to cytopenias. 06/07/2023: restart maintenance revlimid 5 mg PO daily 21 days of 28 days.  09/07/2023: Dose reduce maintenance revlimid to 2.5 mg PO daily 21 days on 7 days off.   Interval History:  MAELEY [name] 68 y.o. female with medical history significant for multiple myeloma with plasmacytoma who presents for a follow up visit. The patient's last visit was on 10/05/2023.  In the interim since her last visit she has continued on revlimid.    On exam today [name] reports she was recently  diagnosed with trigeminal neuralgia.  She reports it is "off-and-on".  She notes that she gets very sensitive on her face when this occurs.  She reports she was prescribed gabapentin but has not been taking it.  She reports that she is tolerating her Revlimid well with no nausea, Oni, or diarrhea.  She is also taking with aspirin with no bleeding, bruising, or dark stools.  She reports that she is doing her best to try to go back to the gym.  She is not having any bone or back pain.  She reports that she is staying up-to-date on her cancer screenings having undergone mammogram and colonoscopy as scheduled.  She denies fevers, chills, sweats, shortness of breath, chest pain or cough. She has no other complaints. Rest of the ROS is below.    MEDICAL HISTORY:  Past Medical History:  Diagnosis Date   Allergy    Anemia    Ankle edema    both ankles   Arthritis    hands,knees,elbows   Cancer (HCC) 2016   tumor kidney   Diabetes mellitus    type 2    Eczema    History of kidney stones    Hypothyroidism    Obesity    Sleep apnea    cpap machine setting of 3   Thyroid disease     SURGICAL HISTORY: Past Surgical History:  Procedure Laterality Date   BREAST BIOPSY Right 2001   CHOLECYSTECTOMY  2009   open   COLONOSCOPY     CYSTOSCOPY W/ RETROGRADES Left 11/22/2018  Procedure: CYSTOSCOPY WITH RETROGRADE PYELOGRAM;  Surgeon: Crist Fat, MD;  Location: [facility];  Service: Urology;  Laterality: Left;   CYSTOSCOPY WITH RETROGRADE PYELOGRAM, URETEROSCOPY AND STENT PLACEMENT Right 10/03/2021   Procedure: CYSTOSCOPY WITH RETROGRADE PYELOGRAM, URETEROSCOPY,STONE EXTRACTION AND STENT PLACEMENT;  Surgeon: Rene Paci, MD;  Location: [facility];  Service: Urology;  Laterality: Right;   DILATATION & CURRETTAGE/HYSTEROSCOPY WITH RESECTOCOPE N/A 01/21/2013   Procedure: DILATATION & CURETTAGE/HYSTEROSCOPY WITH RESECTOCOPE AND RESECTION OF ENDOMETRIAL;  Surgeon: Gretta Cool, MD;  Location:  [facility];  Service: Gynecology;  Laterality: N/A;   HARDWARE REMOVAL N/A 06/05/2022   Procedure: Removal of Posterior Cervical Hardware from Occiput to Cervical Five;  Surgeon: Jadene Pierini, MD;  Location: [facility] OR;  Service: Neurosurgery;  Laterality: N/A;   IR IMAGING GUIDED PORT INSERTION  02/01/2022   KNEE ARTHROSCOPY     left    POLYPECTOMY     POSTERIOR CERVICAL FUSION/FORAMINOTOMY N/A 08/25/2021   Procedure: Occiput to Cervical 5 Posterior cervical instrumented fusion with open biopsy of Cervical 2 Mass. Cervical Two Ganglionectomy;  Surgeon: Jadene Pierini, MD;  Location: [facility];  Service: Neurosurgery;  Laterality: N/A;   ROBOT ASSISTED PYELOPLASTY Left 11/22/2018   Procedure: XI ROBOTIC ASSISTED ATTEMPTED PYELOPLASTY CONVERTED TO NEPHRECTOMY/LYSIS OF ADHESIONS/UMBILICAL HERNIA REPAIR;  Surgeon: Crist Fat, MD;  Location: [facility];  Service: Urology;  Laterality: Left;   ROBOTIC ASSITED PARTIAL NEPHRECTOMY Left 06/11/2015   Procedure: LEFT ROBOTIC ASSISTED LAPAROSCOPIC  PARTIAL NEPHRECTOMY;  Surgeon: Crist Fat, MD;  Location: [facility];  Service: Urology;  Laterality: Left;   ROTATOR CUFF REPAIR Left 2011   URETERAL BIOPSY Left 02/14/2017   Procedure: URETERAL BIOPSY;  Surgeon: Crist Fat, MD;  Location: [facility];  Service: Urology;  Laterality: Left;   URETEROSCOPY WITH HOLMIUM LASER LITHOTRIPSY Left 02/14/2017   Procedure: URETEROSCOPY LITHOTRIPSY, URETERAL BALLOON DILATION;  Surgeon: Crist Fat, MD;  Location: [facility];  Service: Urology;  Laterality: Left;  With STENT    SOCIAL HISTORY: Social History   Socioeconomic History   Marital status: Married    Spouse name: Not on file   Number of children: Not on file   Years of education: Not on file   Highest education level: Not on file  Occupational History   Not on file  Tobacco Use   Smoking status: Never   Smokeless tobacco: Never  Vaping Use   Vaping status: Never Used   Substance and Sexual Activity   Alcohol use: Yes    Comment: occ   Drug use: No   Sexual activity: Not on file  Other Topics Concern   Not on file  Social History Narrative   Not on file   Social Drivers of Health   Financial Resource Strain: Low Risk  (12/24/2023)   Received from [facility]   Overall Financial Resource Strain (CARDIA)    Difficulty of Paying Living Expenses: Not very hard  Food Insecurity: No Food Insecurity (12/24/2023)   Received from [facility]   Hunger Vital Sign    Worried About Running Out of Food in the Last Year: Never true    Ran Out of Food in the Last Year: Never true  Transportation Needs: No Transportation Needs (12/24/2023)   Received from [facility] - Transportation    Lack of Transportation (Medical): No    Lack of Transportation (Non-Medical): No  Physical Activity: Unknown (12/24/2023)   Received from [facility]  Exercise Vital Sign    Days of Exercise per Week: 0 days    Minutes of Exercise per Session: Not on file  Stress: Stress Concern Present (12/24/2023)   Received from [facility] - Occupational Stress Questionnaire    Feeling of Stress : To some extent  Social Connections: Socially Integrated (12/24/2023)   Received from [facility]   Social Network    How would you rate your social network (family, work, friends)?: Good participation with social networks  Intimate Partner Violence: Not At Risk (12/24/2023)   Received from [facility]   HITS    Over the last 12 months how often did your partner physically hurt you?: Never    Over the last 12 months how often did your partner insult you or talk down to you?: Never    Over the last 12 months how often did your partner threaten you with physical harm?: Never    Over the last 12 months how often did your partner scream or curse at you?: Never    FAMILY HISTORY: Family History  Problem Relation Age of Onset   Cancer  Father     ALLERGIES:  is allergic to morphine.  MEDICATIONS:  Current Outpatient Medications  Medication Sig Dispense Refill   Ascorbic Acid (VITAMIN C ADULT GUMMIES PO) Take 1 tablet by mouth daily.     ASPIRIN LOW DOSE 81 MG tablet TAKE 1 TABLET (81 MG TOTAL) BY MOUTH DAILY. SWALLOW WHOLE. 90 tablet 3   atorvastatin (LIPITOR) 20 MG tablet Take 20 mg by mouth at bedtime.     Calcium Carbonate-Vitamin D 600-5 MG-MCG CAPS Take 1 capsule by mouth every morning.     Cholecalciferol (VITAMIN D3 PO) Take by mouth.     Cyanocobalamin (VITAMIN B12) 1000 MCG TBCR Take 1 tablet by mouth every morning.     furosemide (LASIX) 20 MG tablet Take 20 mg by mouth daily.     KLOR-CON M20 20 MEQ tablet TAKE 1 TABLET BY MOUTH EVERY DAY 30 tablet 1   lenalidomide (REVLIMID) 2.5 MG capsule TAKE 1 CAPSULE BY MOUTH 1 TIME A DAY FOR 21 DAYS ON THEN 7 DAYS OFF 21 capsule 0   levothyroxine (SYNTHROID) 50 MCG tablet Take 50 mcg by mouth daily before breakfast.     lidocaine-prilocaine (EMLA) cream Apply 1 Application topically as needed. 30 g 0   Magnesium Hydroxide (MILK OF MAGNESIA PO) Take 15 mLs by mouth daily as needed (constipation/diarrhea).     ondansetron (ZOFRAN) 8 MG tablet Take 1 tablet (8 mg total) by mouth every 8 (eight) hours as needed. 30 tablet 0   OZEMPIC, 0.25 OR 0.5 MG/DOSE, 2 MG/1.5ML SOPN Inject 0.5 mg into the skin every Saturday.     prednisoLONE acetate (PRED FORTE) 1 % ophthalmic suspension Place 1 drop into the left eye 4 (four) times daily.     PROAIR HFA 108 (90 BASE) MCG/ACT inhaler Inhale 2 puffs into the lungs every 4 (four) hours as needed for wheezing or shortness of breath. Uses seasonal  3   prochlorperazine (COMPAZINE) 10 MG tablet Take 1 tablet (10 mg total) by mouth every 6 (six) hours as needed for nausea or vomiting. 30 tablet 0   PROLENSA 0.07 % SOLN Place 1 drop into the right eye at bedtime.     Sennosides (SENOKOT PO) Take 1 tablet by mouth daily as needed  (constipation). Bid     No current facility-administered medications for  this visit.    REVIEW OF SYSTEMS:   Constitutional: ( - ) fevers, ( - )  chills , ( - ) night sweats Eyes: ( - ) blurriness of vision, ( - ) double vision, ( - ) watery eyes Ears, nose, mouth, throat, and face: ( - ) mucositis, ( - ) sore throat Respiratory: ( - ) cough, ( - ) dyspnea, ( - ) wheezes Cardiovascular: ( - ) palpitation, ( - ) chest discomfort, ( - ) lower extremity swelling Gastrointestinal:  ( - ) nausea, ( - ) heartburn, ( -) change in bowel habits Skin: ( - ) abnormal skin rashes Lymphatics: ( - ) new lymphadenopathy, ( - ) easy bruising Neurological: ( - ) numbness, ( - ) tingling, ( - ) new weaknesses Behavioral/Psych: ( - ) mood change, ( - ) new changes  All other systems were reviewed with the patient and are negative.  PHYSICAL EXAMINATION: ECOG PERFORMANCE STATUS: 1 - Symptomatic but completely ambulatory  Vitals:   02/01/24 0848  BP: 136/79  Pulse: 70  Resp: 15  Temp: (!) 97.3 F (36.3 C)  SpO2: 100%      Filed Weights   02/01/24 0848  Weight: 219 lb 6.4 oz (99.5 kg)       GENERAL: Well-appearing middle-age Caucasian female alert, no distress and comfortable SKIN: No evidence of rash, erythema, dry skin, or raised lesions. EYES: conjunctiva are pink and non-injected, sclera clear LUNGS: clear to auscultation and percussion with normal breathing effort HEART: regular rate & rhythm and no murmurs and no lower extremity edema Musculoskeletal: no cyanosis of digits and no clubbing  PSYCH: alert & oriented x 3, fluent speech NEURO: no focal motor/sensory deficits  LABORATORY DATA:  I have reviewed the data as listed    Latest Ref Rng & Units 02/01/2024    8:31 AM 01/03/2024    7:58 AM 12/06/2023    7:36 AM  CBC  WBC 4.0 - 10.5 K/uL 2.8  2.7  3.3   Hemoglobin 12.0 - 15.0 g/dL 78.2  95.6  21.3   Hematocrit 36.0 - 46.0 % 36.4  37.6  37.8   Platelets 150 - 400 K/uL 126   120  155        Latest Ref Rng & Units 02/01/2024    8:31 AM 01/03/2024    7:58 AM 12/06/2023    7:36 AM  CMP  Glucose 70 - 99 mg/dL 086  578  469   BUN 8 - 23 mg/dL 16  13  16    Creatinine 0.44 - 1.00 mg/dL 6.29  5.28  4.13   Sodium 135 - 145 mmol/L 142  141  141   Potassium 3.5 - 5.1 mmol/L 3.7  4.0  3.8   Chloride 98 - 111 mmol/L 109  109  106   CO2 22 - 32 mmol/L 26  25  24    Calcium 8.9 - 10.3 mg/dL 8.6  8.9  8.8   Total Protein 6.5 - 8.1 g/dL 6.4  6.3  6.4   Total Bilirubin 0.0 - 1.2 mg/dL 1.2  1.0  1.1   Alkaline Phos 38 - 126 U/L 78  77  66   AST 15 - 41 U/L 14  13  13    ALT 0 - 44 U/L 13  14  11      Lab Results  Component Value Date   MPROTEIN Not Observed 09/07/2023   MPROTEIN Not Observed 08/10/2023   MPROTEIN Not Observed 07/13/2023  Lab Results  Component Value Date   KPAFRELGTCHN 18.7 01/03/2024   KPAFRELGTCHN 18.2 12/06/2023   KPAFRELGTCHN 18.9 11/06/2023   LAMBDASER 16.7 01/03/2024   LAMBDASER 14.3 12/06/2023   LAMBDASER 15.5 11/06/2023   KAPLAMBRATIO 1.12 01/03/2024   KAPLAMBRATIO 1.27 12/06/2023   KAPLAMBRATIO 1.22 11/06/2023    RADIOGRAPHIC STUDIES: No results found.  ASSESSMENT & PLAN SHADASIA OLDFIELD is a 68 y.o. female with medical history significant for multiple myeloma with plasmacytoma who presents for a follow up visit.   # Free Kappa Multiple Myeloma # Plasmacytoma  -- original finding of L2 plasmacytoma on biopsy of back lesion, bone marrow biopsy confirms greater than 60% plasma cells consistent with multiple myeloma. --Cycle 1 Day 1 of VRD treatment started on 10/26/2021.  --VRD therapy stopped on 11/25/2021 due to rash. Started Dara/Rev/Dex on 12/16/2021.  --will order monthly SPEP, UPEP, SFLC and beta 2 microglobulin --weekly CBC, CMP, and LDH --Cycle 1 Day 1 of Kd started on 01/06/2022 Plan:  --Labs show white blood cell 2.8, hemoglobin 13.2, MCV 85.8, platelets 126 --Most recent myeloma labs from 09/07/2023 showed no M-protein was  undetectable with normal serum free light chains. Continue monthly protein labs with SPEP and serum free light chains. --due to worsening neutropenia, we dose reduced Revlimid 2.5 mg PO daily, 21 days on 7 days off.  --Neutropenic precautions given including monitoring for fevers and chills.  ANC at 1.5. --RTC q 4 weeks for labs and 12 weeks for clinic visit   #Neck pain: --Present for the last month involving more the left lateral side with decreased mobility --CT cervical spine did not show any acute abnormalities on 04/19/2023.   #Supportive Care -- chemotherapy education complete  -- port placement complete. -- sent prescription for EMLA cream -- zofran 8mg  q8H PRN and compazine 10mg  PO q6H for nausea -- Started zometa on 02/03/2022. Will continue q 3 months.HOLD zometa in June 2023 due to upcoming spine surgery. Last dose on 07/28/2022. Restarted in August 2024. Due for next dose Feb 2025.  -- no pain medication required at this time.   No orders of the defined types were placed in this encounter.  All questions were answered. The patient knows to call the clinic with any problems, questions or concerns.  I have spent a total of 30 minutes minutes of face-to-face and non-face-to-face time, preparing to see the patient, performing a medically appropriate examination, counseling and educating the patient, ordering medications/tests, documenting clinical information in the electronic health record, and care coordination.   Ulysees Barns, MD Department of Hematology/Oncology [facility] Phone: [phone] Pager: [phone] Email: [email or web address]   02/03/2024 1:13 PM

## 2024-02-04 ENCOUNTER — Telehealth: Payer: Self-pay | Admitting: Hematology and Oncology

## 2024-02-04 LAB — KAPPA/LAMBDA LIGHT CHAINS
Kappa free light chain: 14.8 mg/L (ref 3.3–19.4)
Kappa, lambda light chain ratio: 1.05 (ref 0.26–1.65)
Lambda free light chains: 14.1 mg/L (ref 5.7–26.3)

## 2024-02-04 LAB — MULTIPLE MYELOMA PANEL, SERUM
Albumin SerPl Elph-Mcnc: 3.8 g/dL (ref 2.9–4.4)
Albumin/Glob SerPl: 1.7 (ref 0.7–1.7)
Alpha 1: 0.2 g/dL (ref 0.0–0.4)
Alpha2 Glob SerPl Elph-Mcnc: 0.6 g/dL (ref 0.4–1.0)
B-Globulin SerPl Elph-Mcnc: 1 g/dL (ref 0.7–1.3)
Gamma Glob SerPl Elph-Mcnc: 0.5 g/dL (ref 0.4–1.8)
Globulin, Total: 2.3 g/dL (ref 2.2–3.9)
IgA: 78 mg/dL — ABNORMAL LOW (ref 87–352)
IgG (Immunoglobin G), Serum: 547 mg/dL — ABNORMAL LOW (ref 586–1602)
IgM (Immunoglobulin M), Srm: 26 mg/dL (ref 26–217)
Total Protein ELP: 6.1 g/dL (ref 6.0–8.5)

## 2024-02-27 ENCOUNTER — Other Ambulatory Visit: Payer: Self-pay | Admitting: Physician Assistant

## 2024-02-27 DIAGNOSIS — C9001 Multiple myeloma in remission: Secondary | ICD-10-CM

## 2024-02-28 ENCOUNTER — Inpatient Hospital Stay: Attending: Radiation Oncology

## 2024-02-28 ENCOUNTER — Other Ambulatory Visit: Payer: Self-pay | Admitting: Hematology and Oncology

## 2024-02-28 DIAGNOSIS — C9000 Multiple myeloma not having achieved remission: Secondary | ICD-10-CM | POA: Insufficient documentation

## 2024-02-28 DIAGNOSIS — C9001 Multiple myeloma in remission: Secondary | ICD-10-CM

## 2024-02-28 DIAGNOSIS — Z95828 Presence of other vascular implants and grafts: Secondary | ICD-10-CM

## 2024-02-28 LAB — CMP (CANCER CENTER ONLY)
ALT: 12 U/L (ref 0–44)
AST: 13 U/L — ABNORMAL LOW (ref 15–41)
Albumin: 4.3 g/dL (ref 3.5–5.0)
Alkaline Phosphatase: 65 U/L (ref 38–126)
Anion gap: 6 (ref 5–15)
BUN: 17 mg/dL (ref 8–23)
CO2: 24 mmol/L (ref 22–32)
Calcium: 9 mg/dL (ref 8.9–10.3)
Chloride: 111 mmol/L (ref 98–111)
Creatinine: 1.08 mg/dL — ABNORMAL HIGH (ref 0.44–1.00)
GFR, Estimated: 56 mL/min — ABNORMAL LOW (ref >60)
Glucose, Bld: 116 mg/dL — ABNORMAL HIGH (ref 70–99)
Potassium: 3.8 mmol/L (ref 3.5–5.1)
Sodium: 141 mmol/L (ref 135–145)
Total Bilirubin: 1.2 mg/dL (ref 0.0–1.2)
Total Protein: 6.4 g/dL — ABNORMAL LOW (ref 6.5–8.1)

## 2024-02-28 LAB — CBC WITH DIFFERENTIAL (CANCER CENTER ONLY)
Abs Immature Granulocytes: 0.01 10*3/uL (ref 0.00–0.07)
Basophils Absolute: 0 10*3/uL (ref 0.0–0.1)
Basophils Relative: 2 %
Eosinophils Absolute: 0.1 10*3/uL (ref 0.0–0.5)
Eosinophils Relative: 2 %
HCT: 35.5 % — ABNORMAL LOW (ref 36.0–46.0)
Hemoglobin: 12.8 g/dL (ref 12.0–15.0)
Immature Granulocytes: 1 %
Lymphocytes Relative: 42 %
Lymphs Abs: 0.9 10*3/uL (ref 0.7–4.0)
MCH: 31.2 pg (ref 26.0–34.0)
MCHC: 36.1 g/dL — ABNORMAL HIGH (ref 30.0–36.0)
MCV: 86.6 fL (ref 80.0–100.0)
Monocytes Absolute: 0.1 10*3/uL (ref 0.1–1.0)
Monocytes Relative: 7 %
Neutro Abs: 1 10*3/uL — ABNORMAL LOW (ref 1.7–7.7)
Neutrophils Relative %: 46 %
Platelet Count: 123 10*3/uL — ABNORMAL LOW (ref 150–400)
RBC: 4.1 MIL/uL (ref 3.87–5.11)
RDW: 13.9 % (ref 11.5–15.5)
WBC Count: 2.1 10*3/uL — ABNORMAL LOW (ref 4.0–10.5)
nRBC: 0 % (ref 0.0–0.2)

## 2024-02-28 LAB — LACTATE DEHYDROGENASE: LDH: 179 U/L (ref 98–192)

## 2024-02-28 MED ORDER — SODIUM CHLORIDE 0.9% FLUSH
10.0000 mL | Freq: Once | INTRAVENOUS | Status: AC
  Administered 2024-02-28: 10 mL

## 2024-02-28 MED ORDER — HEPARIN SOD (PORK) LOCK FLUSH 100 UNIT/ML IV SOLN
500.0000 [IU] | Freq: Once | INTRAVENOUS | Status: AC
  Administered 2024-02-28: 500 [IU]

## 2024-02-29 LAB — KAPPA/LAMBDA LIGHT CHAINS
Kappa free light chain: 15.4 mg/L (ref 3.3–19.4)
Kappa, lambda light chain ratio: 1.12 (ref 0.26–1.65)
Lambda free light chains: 13.7 mg/L (ref 5.7–26.3)

## 2024-03-02 LAB — MULTIPLE MYELOMA PANEL, SERUM
Albumin SerPl Elph-Mcnc: 3.9 g/dL (ref 2.9–4.4)
Albumin/Glob SerPl: 1.9 — ABNORMAL HIGH (ref 0.7–1.7)
Alpha 1: 0.2 g/dL (ref 0.0–0.4)
Alpha2 Glob SerPl Elph-Mcnc: 0.5 g/dL (ref 0.4–1.0)
B-Globulin SerPl Elph-Mcnc: 0.9 g/dL (ref 0.7–1.3)
Gamma Glob SerPl Elph-Mcnc: 0.5 g/dL (ref 0.4–1.8)
Globulin, Total: 2.1 g/dL — ABNORMAL LOW (ref 2.2–3.9)
IgA: 79 mg/dL — ABNORMAL LOW (ref 87–352)
IgG (Immunoglobin G), Serum: 535 mg/dL — ABNORMAL LOW (ref 586–1602)
IgM (Immunoglobulin M), Srm: 22 mg/dL — ABNORMAL LOW (ref 26–217)
Total Protein ELP: 6 g/dL (ref 6.0–8.5)

## 2024-03-06 ENCOUNTER — Other Ambulatory Visit: Payer: Self-pay | Admitting: *Deleted

## 2024-03-06 ENCOUNTER — Other Ambulatory Visit: Payer: Self-pay | Admitting: Hematology and Oncology

## 2024-03-06 MED ORDER — LENALIDOMIDE 2.5 MG PO CAPS: 2.5000 mg | ORAL_CAPSULE | Freq: Every day | ORAL | 0 refills | Status: DC

## 2024-03-26 ENCOUNTER — Other Ambulatory Visit: Payer: Self-pay | Admitting: Physician Assistant

## 2024-03-26 DIAGNOSIS — C9001 Multiple myeloma in remission: Secondary | ICD-10-CM

## 2024-03-27 ENCOUNTER — Inpatient Hospital Stay: Attending: Radiation Oncology

## 2024-03-27 DIAGNOSIS — C9000 Multiple myeloma not having achieved remission: Secondary | ICD-10-CM | POA: Diagnosis present

## 2024-03-27 DIAGNOSIS — C9001 Multiple myeloma in remission: Secondary | ICD-10-CM

## 2024-03-27 DIAGNOSIS — Z452 Encounter for adjustment and management of vascular access device: Secondary | ICD-10-CM | POA: Diagnosis not present

## 2024-03-27 DIAGNOSIS — Z95828 Presence of other vascular implants and grafts: Secondary | ICD-10-CM

## 2024-03-27 LAB — CBC WITH DIFFERENTIAL (CANCER CENTER ONLY)
Abs Immature Granulocytes: 0 10*3/uL (ref 0.00–0.07)
Basophils Absolute: 0 10*3/uL (ref 0.0–0.1)
Basophils Relative: 2 %
Eosinophils Absolute: 0.1 10*3/uL (ref 0.0–0.5)
Eosinophils Relative: 3 %
HCT: 35.9 % — ABNORMAL LOW (ref 36.0–46.0)
Hemoglobin: 13.2 g/dL (ref 12.0–15.0)
Immature Granulocytes: 0 %
Lymphocytes Relative: 50 %
Lymphs Abs: 0.9 10*3/uL (ref 0.7–4.0)
MCH: 31.2 pg (ref 26.0–34.0)
MCHC: 36.8 g/dL — ABNORMAL HIGH (ref 30.0–36.0)
MCV: 84.9 fL (ref 80.0–100.0)
Monocytes Absolute: 0.2 10*3/uL (ref 0.1–1.0)
Monocytes Relative: 11 %
Neutro Abs: 0.6 10*3/uL — ABNORMAL LOW (ref 1.7–7.7)
Neutrophils Relative %: 34 %
Platelet Count: 122 10*3/uL — ABNORMAL LOW (ref 150–400)
RBC: 4.23 MIL/uL (ref 3.87–5.11)
RDW: 14.3 % (ref 11.5–15.5)
WBC Count: 1.8 10*3/uL — ABNORMAL LOW (ref 4.0–10.5)
nRBC: 0 % (ref 0.0–0.2)

## 2024-03-27 LAB — CMP (CANCER CENTER ONLY)
ALT: 16 U/L (ref 0–44)
AST: 17 U/L (ref 15–41)
Albumin: 4.3 g/dL (ref 3.5–5.0)
Alkaline Phosphatase: 51 U/L (ref 38–126)
Anion gap: 8 (ref 5–15)
BUN: 15 mg/dL (ref 8–23)
CO2: 27 mmol/L (ref 22–32)
Calcium: 8.9 mg/dL (ref 8.9–10.3)
Chloride: 107 mmol/L (ref 98–111)
Creatinine: 1.02 mg/dL — ABNORMAL HIGH (ref 0.44–1.00)
GFR, Estimated: 60 mL/min — ABNORMAL LOW (ref >60)
Glucose, Bld: 106 mg/dL — ABNORMAL HIGH (ref 70–99)
Potassium: 3.4 mmol/L — ABNORMAL LOW (ref 3.5–5.1)
Sodium: 142 mmol/L (ref 135–145)
Total Bilirubin: 2.2 mg/dL — ABNORMAL HIGH (ref 0.0–1.2)
Total Protein: 6.3 g/dL — ABNORMAL LOW (ref 6.5–8.1)

## 2024-03-27 LAB — LACTATE DEHYDROGENASE: LDH: 189 U/L (ref 98–192)

## 2024-03-27 MED ORDER — SODIUM CHLORIDE 0.9% FLUSH
10.0000 mL | Freq: Once | INTRAVENOUS | Status: AC
  Administered 2024-03-27: 10 mL

## 2024-03-27 MED ORDER — HEPARIN SOD (PORK) LOCK FLUSH 100 UNIT/ML IV SOLN
500.0000 [IU] | Freq: Once | INTRAVENOUS | Status: AC
  Administered 2024-03-27: 500 [IU]

## 2024-03-28 LAB — KAPPA/LAMBDA LIGHT CHAINS
Kappa free light chain: 15.9 mg/L (ref 3.3–19.4)
Kappa, lambda light chain ratio: 1.16 (ref 0.26–1.65)
Lambda free light chains: 13.7 mg/L (ref 5.7–26.3)

## 2024-03-31 ENCOUNTER — Telehealth: Payer: Self-pay | Admitting: *Deleted

## 2024-03-31 ENCOUNTER — Encounter: Payer: Self-pay | Admitting: Physician Assistant

## 2024-03-31 ENCOUNTER — Other Ambulatory Visit: Payer: Self-pay | Admitting: *Deleted

## 2024-03-31 DIAGNOSIS — D701 Agranulocytosis secondary to cancer chemotherapy: Secondary | ICD-10-CM

## 2024-03-31 LAB — MULTIPLE MYELOMA PANEL, SERUM
Albumin SerPl Elph-Mcnc: 3.7 g/dL (ref 2.9–4.4)
Albumin/Glob SerPl: 1.9 — ABNORMAL HIGH (ref 0.7–1.7)
Alpha 1: 0.2 g/dL (ref 0.0–0.4)
Alpha2 Glob SerPl Elph-Mcnc: 0.5 g/dL (ref 0.4–1.0)
B-Globulin SerPl Elph-Mcnc: 0.8 g/dL (ref 0.7–1.3)
Gamma Glob SerPl Elph-Mcnc: 0.4 g/dL (ref 0.4–1.8)
Globulin, Total: 2 g/dL — ABNORMAL LOW (ref 2.2–3.9)
IgA: 77 mg/dL — ABNORMAL LOW (ref 87–352)
IgG (Immunoglobin G), Serum: 516 mg/dL — ABNORMAL LOW (ref 586–1602)
IgM (Immunoglobulin M), Srm: 20 mg/dL — ABNORMAL LOW (ref 26–217)
Total Protein ELP: 5.7 g/dL — ABNORMAL LOW (ref 6.0–8.5)

## 2024-03-31 NOTE — Telephone Encounter (Signed)
 TCT patient regarding her CBC results from 03/27/24. Spoke with her. Advised that her WBC and ANC are low. Advised that Wyline Hearing, PA-C recommended she hold the rest of this current cycle's Revlimid , have her CBC checked prior to starting the next cycle of Revlimid .  Mira voiced understanding and is agreeable to hold the 6-7 days worth of Revlimid  and coming in for repeat labs on 04/11/24. Scheduling message sent

## 2024-04-01 ENCOUNTER — Other Ambulatory Visit: Payer: Self-pay | Admitting: Hematology and Oncology

## 2024-04-02 ENCOUNTER — Other Ambulatory Visit: Payer: Self-pay | Admitting: *Deleted

## 2024-04-02 MED ORDER — LENALIDOMIDE 2.5 MG PO CAPS: 2.5000 mg | ORAL_CAPSULE | Freq: Every day | ORAL | 0 refills | Status: DC

## 2024-04-11 ENCOUNTER — Inpatient Hospital Stay

## 2024-04-11 DIAGNOSIS — D701 Agranulocytosis secondary to cancer chemotherapy: Secondary | ICD-10-CM

## 2024-04-11 DIAGNOSIS — Z452 Encounter for adjustment and management of vascular access device: Secondary | ICD-10-CM | POA: Diagnosis not present

## 2024-04-11 DIAGNOSIS — Z95828 Presence of other vascular implants and grafts: Secondary | ICD-10-CM

## 2024-04-11 LAB — CMP (CANCER CENTER ONLY)
ALT: 16 U/L (ref 0–44)
AST: 19 U/L (ref 15–41)
Albumin: 4.5 g/dL (ref 3.5–5.0)
Alkaline Phosphatase: 52 U/L (ref 38–126)
Anion gap: 9 (ref 5–15)
BUN: 21 mg/dL (ref 8–23)
CO2: 24 mmol/L (ref 22–32)
Calcium: 9.3 mg/dL (ref 8.9–10.3)
Chloride: 108 mmol/L (ref 98–111)
Creatinine: 1.03 mg/dL — ABNORMAL HIGH (ref 0.44–1.00)
GFR, Estimated: 59 mL/min — ABNORMAL LOW (ref >60)
Glucose, Bld: 111 mg/dL — ABNORMAL HIGH (ref 70–99)
Potassium: 3.8 mmol/L (ref 3.5–5.1)
Sodium: 141 mmol/L (ref 135–145)
Total Bilirubin: 1.8 mg/dL — ABNORMAL HIGH (ref 0.0–1.2)
Total Protein: 6.6 g/dL (ref 6.5–8.1)

## 2024-04-11 LAB — CBC WITH DIFFERENTIAL (CANCER CENTER ONLY)
Abs Immature Granulocytes: 0.01 10*3/uL (ref 0.00–0.07)
Basophils Absolute: 0 10*3/uL (ref 0.0–0.1)
Basophils Relative: 2 %
Eosinophils Absolute: 0 10*3/uL (ref 0.0–0.5)
Eosinophils Relative: 2 %
HCT: 37 % (ref 36.0–46.0)
Hemoglobin: 13.5 g/dL (ref 12.0–15.0)
Immature Granulocytes: 1 %
Lymphocytes Relative: 45 %
Lymphs Abs: 0.9 10*3/uL (ref 0.7–4.0)
MCH: 31.2 pg (ref 26.0–34.0)
MCHC: 36.5 g/dL — ABNORMAL HIGH (ref 30.0–36.0)
MCV: 85.5 fL (ref 80.0–100.0)
Monocytes Absolute: 0.3 10*3/uL (ref 0.1–1.0)
Monocytes Relative: 17 %
Neutro Abs: 0.6 10*3/uL — ABNORMAL LOW (ref 1.7–7.7)
Neutrophils Relative %: 33 %
Platelet Count: 92 10*3/uL — ABNORMAL LOW (ref 150–400)
RBC: 4.33 MIL/uL (ref 3.87–5.11)
RDW: 14 % (ref 11.5–15.5)
WBC Count: 1.9 10*3/uL — ABNORMAL LOW (ref 4.0–10.5)
nRBC: 0 % (ref 0.0–0.2)

## 2024-04-11 MED ORDER — SODIUM CHLORIDE 0.9% FLUSH
10.0000 mL | Freq: Once | INTRAVENOUS | Status: AC
  Administered 2024-04-11: 10 mL

## 2024-04-11 MED ORDER — HEPARIN SOD (PORK) LOCK FLUSH 100 UNIT/ML IV SOLN
500.0000 [IU] | Freq: Once | INTRAVENOUS | Status: AC
  Administered 2024-04-11: 500 [IU]

## 2024-04-18 ENCOUNTER — Telehealth: Payer: Self-pay | Admitting: *Deleted

## 2024-04-18 NOTE — Telephone Encounter (Signed)
 TCT patient regarding recent lab results. Spoke with her. Advised that her WBC and ANC remain low and to continue to hold her Revlimid . She thought that would be the case and has not taken it. She is aware of her appts here next week and Dr. Rosaline Coma will see how her labs are then. No questions or concerns.

## 2024-04-24 ENCOUNTER — Inpatient Hospital Stay

## 2024-04-24 ENCOUNTER — Other Ambulatory Visit: Payer: Self-pay | Admitting: Hematology and Oncology

## 2024-04-24 ENCOUNTER — Inpatient Hospital Stay: Attending: Radiation Oncology | Admitting: Hematology and Oncology

## 2024-04-24 VITALS — BP 115/56 | HR 72 | Temp 97.7°F | Resp 14 | Wt 198.0 lb

## 2024-04-24 DIAGNOSIS — Z95828 Presence of other vascular implants and grafts: Secondary | ICD-10-CM

## 2024-04-24 DIAGNOSIS — C9001 Multiple myeloma in remission: Secondary | ICD-10-CM

## 2024-04-24 DIAGNOSIS — C9000 Multiple myeloma not having achieved remission: Secondary | ICD-10-CM | POA: Diagnosis present

## 2024-04-24 LAB — CMP (CANCER CENTER ONLY)
ALT: 19 U/L (ref 0–44)
AST: 20 U/L (ref 15–41)
Albumin: 4.3 g/dL (ref 3.5–5.0)
Alkaline Phosphatase: 53 U/L (ref 38–126)
Anion gap: 8 (ref 5–15)
BUN: 21 mg/dL (ref 8–23)
CO2: 25 mmol/L (ref 22–32)
Calcium: 9.3 mg/dL (ref 8.9–10.3)
Chloride: 109 mmol/L (ref 98–111)
Creatinine: 1.06 mg/dL — ABNORMAL HIGH (ref 0.44–1.00)
GFR, Estimated: 57 mL/min — ABNORMAL LOW (ref >60)
Glucose, Bld: 113 mg/dL — ABNORMAL HIGH (ref 70–99)
Potassium: 3.9 mmol/L (ref 3.5–5.1)
Sodium: 142 mmol/L (ref 135–145)
Total Bilirubin: 1.4 mg/dL — ABNORMAL HIGH (ref 0.0–1.2)
Total Protein: 6.3 g/dL — ABNORMAL LOW (ref 6.5–8.1)

## 2024-04-24 LAB — CBC WITH DIFFERENTIAL (CANCER CENTER ONLY)
Abs Immature Granulocytes: 0.01 10*3/uL (ref 0.00–0.07)
Basophils Absolute: 0 10*3/uL (ref 0.0–0.1)
Basophils Relative: 1 %
Eosinophils Absolute: 0 10*3/uL (ref 0.0–0.5)
Eosinophils Relative: 1 %
HCT: 36.3 % (ref 36.0–46.0)
Hemoglobin: 13 g/dL (ref 12.0–15.0)
Immature Granulocytes: 0 %
Lymphocytes Relative: 37 %
Lymphs Abs: 0.9 10*3/uL (ref 0.7–4.0)
MCH: 30.8 pg (ref 26.0–34.0)
MCHC: 35.8 g/dL (ref 30.0–36.0)
MCV: 86 fL (ref 80.0–100.0)
Monocytes Absolute: 0.3 10*3/uL (ref 0.1–1.0)
Monocytes Relative: 11 %
Neutro Abs: 1.2 10*3/uL — ABNORMAL LOW (ref 1.7–7.7)
Neutrophils Relative %: 50 %
Platelet Count: 139 10*3/uL — ABNORMAL LOW (ref 150–400)
RBC: 4.22 MIL/uL (ref 3.87–5.11)
RDW: 14.2 % (ref 11.5–15.5)
WBC Count: 2.5 10*3/uL — ABNORMAL LOW (ref 4.0–10.5)
nRBC: 0 % (ref 0.0–0.2)

## 2024-04-24 LAB — LACTATE DEHYDROGENASE: LDH: 180 U/L (ref 98–192)

## 2024-04-24 MED ORDER — ALTEPLASE 2 MG IJ SOLR
2.0000 mg | Freq: Once | INTRAMUSCULAR | Status: DC | PRN
Start: 2024-04-24 — End: 2024-04-24

## 2024-04-24 MED ORDER — HEPARIN SOD (PORK) LOCK FLUSH 100 UNIT/ML IV SOLN: 500.0000 [IU] | Freq: Once | INTRAVENOUS | Status: DC

## 2024-04-24 MED ORDER — SODIUM CHLORIDE 0.9% FLUSH: 10.0000 mL | Freq: Once | INTRAVENOUS | Status: DC

## 2024-04-24 MED ORDER — SODIUM CHLORIDE 0.9% FLUSH
3.0000 mL | Freq: Once | INTRAVENOUS | Status: AC | PRN
  Administered 2024-04-24: 3 mL

## 2024-04-24 MED ORDER — ZOLEDRONIC ACID 4 MG/100ML IV SOLN
4.0000 mg | Freq: Once | INTRAVENOUS | Status: AC
  Administered 2024-04-24: 4 mg via INTRAVENOUS
  Filled 2024-04-24: qty 100

## 2024-04-24 MED ORDER — SODIUM CHLORIDE 0.9 % IV SOLN: Freq: Once | INTRAVENOUS | Status: AC

## 2024-04-24 MED ORDER — HEPARIN SOD (PORK) LOCK FLUSH 100 UNIT/ML IV SOLN
500.0000 [IU] | Freq: Once | INTRAVENOUS | Status: DC | PRN
Start: 2024-04-24 — End: 2024-04-24

## 2024-04-24 MED ORDER — HEPARIN SOD (PORK) LOCK FLUSH 100 UNIT/ML IV SOLN
250.0000 [IU] | Freq: Once | INTRAVENOUS | Status: AC | PRN
  Administered 2024-04-24: 250 [IU]

## 2024-04-24 MED ORDER — SODIUM CHLORIDE 0.9% FLUSH
10.0000 mL | Freq: Once | INTRAVENOUS | Status: AC
  Administered 2024-04-24: 10 mL

## 2024-04-24 NOTE — Patient Instructions (Signed)

## 2024-04-24 NOTE — Progress Notes (Signed)
 [facility] [phone]   [phone]  PROGRESS NOTE  Patient Care Team: Helena Loach as PCP - General (Physician Assistant)  Hematological/Oncological History # Plasmacytoma with Multiple Myeloma  07/29/2021: CT cervical spine shows expansile lytic lesion which almost completely replaces the normal C2 vertebral body  08/24/2021: PET CT scan shows increased metabolic activity at C2 and C7  08/25/2021: biopsy of C2 lesion showed finding consistent with plasma cell neoplasm.  09/08/2021: start radiation therapy 09/12/2021: establish care with Dr. Rosaline Coma  09/28/2021: end radiation therapy 10/10/2021: Bone marrow biopsy. Results confirm plasma cell neoplasm consistent with multiple myeloma, kappa restricted.  10/26/2021: Cycle 1 Day 1 of VRD 11/18/2021: Cycle 2 Day 1 of VRD 11/25/2021: Velcade  HELD due to full body rash. Resolved with steroid therapy.  12/16/2021: Cycle 1 Day 1 of Dara/Rev/Dex 12/30/2021: HELD Daratumumab  due to toxicity. Patient unable to tolerate. Requested holding the medication  01/06/2022: Cycle 1 Day 1 of Kd  02/03/2022: Cycle 2 Day 1 of Kd 03/03/2022: Cycle 3 Day 1 of Kd 03/31/2022: Cycle 4 Day 1 of Kd 04/28/2022: Cycle 5 Day 1 of Kd 05/26/2022: Cycle 6 Day 1 of Kd  06/30/2022: Cycle 7 Day 1 of Kd  07/20/2022: Cycle 8 Day 1 of Kd  Sept 2023: start of maintenance revlimid  10 mg PO daily 21 of 28 day cycles.  01/05/2023: HELD Revlimid  due to cytopenias. 06/07/2023: restart maintenance revlimid  5 mg PO daily 21 days of 28 days.  09/07/2023: Dose reduce maintenance revlimid  to 2.5 mg PO daily 21 days on 7 days off.   Interval History:  Carol [name] 68 y.o. female with medical history significant for multiple myeloma with plasmacytoma who presents for a follow up visit. The patient's last visit was on 02/01/2024.  In the interim since her last visit she has continued on revlimid .    On exam today Mrs. Patton reports he has been well overall  in the interim since her last visit.  She notes that she does have some occasional fatigue but no other major new symptoms.  She reports her predental neuralgia is under good control and she is not required any pain medication for it.  She remains off her Revlimid  therapy and has not had any for 7 weeks.  She reports that she is not having any lightheadedness, dizziness, or shortness of breath.  She also denies any overt signs of bleeding, bruising, or dark stools.  She is losing a considerable amount of weight but this is intentional as she has dropped her sugar from her diet.  She notes that she would like to go to the gym but she is nervous about her immune system being too weak.  Otherwise she denies any fevers, chills, sweats, nausea, vomiting or diarrhea.  A full 10 point ROS is otherwise negative.  Today we discussed options moving forward.  We discussed that once her blood counts have improved we can restart her Revlimid  at 2.5 mg p.o. every other day for 21 days.  She voiced understanding of our findings and plan moving forward.  She is agreeable to holding the Revlimid  at this time in order to allow her blood counts to improve.   MEDICAL HISTORY:  Past Medical History:  Diagnosis Date   Allergy    Anemia    Ankle edema    both ankles   Arthritis    hands,knees,elbows   Cancer (HCC) 2016   tumor kidney   Diabetes mellitus  type 2    Eczema    History of kidney stones    Hypothyroidism    Obesity    Sleep apnea    cpap machine setting of 3   Thyroid disease     SURGICAL HISTORY: Past Surgical History:  Procedure Laterality Date   BREAST BIOPSY Right 2001   CHOLECYSTECTOMY  2009   open   COLONOSCOPY     CYSTOSCOPY W/ RETROGRADES Left 11/22/2018   Procedure: CYSTOSCOPY WITH RETROGRADE PYELOGRAM;  Surgeon: Andrez Banker, MD;  Location: [facility];  Service: Urology;  Laterality: Left;   CYSTOSCOPY WITH RETROGRADE PYELOGRAM, URETEROSCOPY AND STENT PLACEMENT Right 10/03/2021    Procedure: CYSTOSCOPY WITH RETROGRADE PYELOGRAM, URETEROSCOPY,STONE EXTRACTION AND STENT PLACEMENT;  Surgeon: Adelbert Homans, MD;  Location: [facility];  Service: Urology;  Laterality: Right;   DILATATION & CURRETTAGE/HYSTEROSCOPY WITH RESECTOCOPE N/A 01/21/2013   Procedure: DILATATION & CURETTAGE/HYSTEROSCOPY WITH RESECTOCOPE AND RESECTION OF ENDOMETRIAL;  Surgeon: Mills Alma, MD;  Location: [facility];  Service: Gynecology;  Laterality: N/A;   HARDWARE REMOVAL N/A 06/05/2022   Procedure: Removal of Posterior Cervical Hardware from Occiput to Cervical Five;  Surgeon: Cannon Champion, MD;  Location: [facility] OR;  Service: Neurosurgery;  Laterality: N/A;   IR IMAGING GUIDED PORT INSERTION  02/01/2022   KNEE ARTHROSCOPY     left    POLYPECTOMY     POSTERIOR CERVICAL FUSION/FORAMINOTOMY N/A 08/25/2021   Procedure: Occiput to Cervical 5 Posterior cervical instrumented fusion with open biopsy of Cervical 2 Mass. Cervical Two Ganglionectomy;  Surgeon: Cannon Champion, MD;  Location: [facility];  Service: Neurosurgery;  Laterality: N/A;   ROBOT ASSISTED PYELOPLASTY Left 11/22/2018   Procedure: XI ROBOTIC ASSISTED ATTEMPTED PYELOPLASTY CONVERTED TO NEPHRECTOMY/LYSIS OF ADHESIONS/UMBILICAL HERNIA REPAIR;  Surgeon: Andrez Banker, MD;  Location: [facility];  Service: Urology;  Laterality: Left;   ROBOTIC ASSITED PARTIAL NEPHRECTOMY Left 06/11/2015   Procedure: LEFT ROBOTIC ASSISTED LAPAROSCOPIC  PARTIAL NEPHRECTOMY;  Surgeon: Andrez Banker, MD;  Location: [facility];  Service: Urology;  Laterality: Left;   ROTATOR CUFF REPAIR Left 2011   URETERAL BIOPSY Left 02/14/2017   Procedure: URETERAL BIOPSY;  Surgeon: Andrez Banker, MD;  Location: [facility];  Service: Urology;  Laterality: Left;   URETEROSCOPY WITH HOLMIUM LASER LITHOTRIPSY Left 02/14/2017   Procedure: URETEROSCOPY LITHOTRIPSY, URETERAL BALLOON DILATION;  Surgeon: Andrez Banker, MD;  Location: [facility];  Service: Urology;   Laterality: Left;  With STENT    SOCIAL HISTORY: Social History   Socioeconomic History   Marital status: Married    Spouse name: Not on file   Number of children: Not on file   Years of education: Not on file   Highest education level: Not on file  Occupational History   Not on file  Tobacco Use   Smoking status: Never   Smokeless tobacco: Never  Vaping Use   Vaping status: Never Used  Substance and Sexual Activity   Alcohol use: Yes    Comment: occ   Drug use: No   Sexual activity: Not on file  Other Topics Concern   Not on file  Social History Narrative   Not on file   Social Drivers of Health   Financial Resource Strain: Low Risk  (12/24/2023)   Received from [facility]   Overall Financial Resource Strain (CARDIA)    Difficulty of Paying Living Expenses: Not very hard  Food Insecurity: No Food Insecurity (12/24/2023)   Received from [facility]   Hunger Vital Sign    Worried About Running Out of Food in the Last Year: Never true    Ran Out of Food in the Last Year: Never true  Transportation Needs: No Transportation Needs (12/24/2023)   Received from [facility] - Transportation    Lack of Transportation (Medical): No    Lack of Transportation (Non-Medical): No  Physical Activity: Unknown (12/24/2023)   Received from [facility]   Exercise Vital Sign    Days of Exercise per Week: 0 days    Minutes of Exercise per Session: Not on file  Stress: Stress Concern Present (12/24/2023)   Received from [facility] - Occupational Stress Questionnaire    Feeling of Stress : To some extent  Social Connections: Socially Integrated (12/24/2023)   Received from [facility]   Social Network    How would you rate your social network (family, work, friends)?: Good participation with social networks  Intimate Partner Violence: Not At Risk (12/24/2023)   Received from [facility]   HITS    Over the last 12 months how  often did your partner physically hurt you?: Never    Over the last 12 months how often did your partner insult you or talk down to you?: Never    Over the last 12 months how often did your partner threaten you with physical harm?: Never    Over the last 12 months how often did your partner scream or curse at you?: Never    FAMILY HISTORY: Family History  Problem Relation Age of Onset   Cancer Father     ALLERGIES:  is allergic to morphine .  MEDICATIONS:  Current Outpatient Medications  Medication Sig Dispense Refill   Ascorbic Acid (VITAMIN C ADULT GUMMIES PO) Take 1 tablet by mouth daily.     ASPIRIN  LOW DOSE 81 MG tablet TAKE 1 TABLET (81 MG TOTAL) BY MOUTH DAILY. SWALLOW WHOLE. 90 tablet 3   atorvastatin  (LIPITOR) 20 MG tablet Take 20 mg by mouth at bedtime.     Calcium  Carbonate-Vitamin D  600-5 MG-MCG CAPS Take 1 capsule by mouth every morning.     Cholecalciferol (VITAMIN D3 PO) Take by mouth.     Cyanocobalamin (VITAMIN B12) 1000 MCG TBCR Take 1 tablet by mouth every morning.     furosemide  (LASIX ) 20 MG tablet Take 20 mg by mouth daily.     KLOR-CON  M20 20 MEQ tablet TAKE 1 TABLET BY MOUTH EVERY DAY 30 tablet 1   lenalidomide  (REVLIMID ) 2.5 MG capsule Take 1 capsule (2.5 mg total) by mouth daily. Auth # [id number]   Date Obtained 04/02/24 Take one capsule daily for 21 days then none for 7 days 21 capsule 0   levothyroxine  (SYNTHROID ) 50 MCG tablet Take 50 mcg by mouth daily before breakfast.     lidocaine -prilocaine  (EMLA ) cream Apply 1 Application topically as needed. 30 g 0   Magnesium Hydroxide (MILK OF MAGNESIA PO) Take 15 mLs by mouth daily as needed (constipation/diarrhea).     ondansetron  (ZOFRAN ) 8 MG tablet Take 1 tablet (8 mg total) by mouth every 8 (eight) hours as needed. 30 tablet 0   OZEMPIC , 0.25 OR 0.5 MG/DOSE, 2 MG/1.5ML SOPN Inject 0.5 mg into the skin every Saturday.     prednisoLONE acetate (PRED FORTE) 1 % ophthalmic suspension Place 1 drop into the left eye 4  (four) times daily.     PROAIR  HFA 108 (90 BASE)  MCG/ACT inhaler Inhale 2 puffs into the lungs every 4 (four) hours as needed for wheezing or shortness of breath. Uses seasonal  3   prochlorperazine  (COMPAZINE ) 10 MG tablet Take 1 tablet (10 mg total) by mouth every 6 (six) hours as needed for nausea or vomiting. 30 tablet 0   PROLENSA 0.07 % SOLN Place 1 drop into the right eye at bedtime.     Sennosides (SENOKOT PO) Take 1 tablet by mouth daily as needed (constipation). Bid     No current facility-administered medications for this visit.    REVIEW OF SYSTEMS:   Constitutional: ( - ) fevers, ( - )  chills , ( - ) night sweats Eyes: ( - ) blurriness of vision, ( - ) double vision, ( - ) watery eyes Ears, nose, mouth, throat, and face: ( - ) mucositis, ( - ) sore throat Respiratory: ( - ) cough, ( - ) dyspnea, ( - ) wheezes Cardiovascular: ( - ) palpitation, ( - ) chest discomfort, ( - ) lower extremity swelling Gastrointestinal:  ( - ) nausea, ( - ) heartburn, ( -) change in bowel habits Skin: ( - ) abnormal skin rashes Lymphatics: ( - ) new lymphadenopathy, ( - ) easy bruising Neurological: ( - ) numbness, ( - ) tingling, ( - ) new weaknesses Behavioral/Psych: ( - ) mood change, ( - ) new changes  All other systems were reviewed with the patient and are negative.  PHYSICAL EXAMINATION: ECOG PERFORMANCE STATUS: 1 - Symptomatic but completely ambulatory  Vitals:   04/24/24 0836  BP: (!) 115/56  Pulse: 72  Resp: 14  Temp: 97.7 F (36.5 C)  SpO2: 100%       Filed Weights   04/24/24 0836  Weight: 198 lb (89.8 kg)        GENERAL: Well-appearing middle-age Caucasian female alert, no distress and comfortable SKIN: No evidence of rash, erythema, dry skin, or raised lesions. EYES: conjunctiva are pink and non-injected, sclera clear LUNGS: clear to auscultation and percussion with normal breathing effort HEART: regular rate & rhythm and no murmurs and no lower extremity  edema Musculoskeletal: no cyanosis of digits and no clubbing  PSYCH: alert & oriented x 3, fluent speech NEURO: no focal motor/sensory deficits  LABORATORY DATA:  I have reviewed the data as listed    Latest Ref Rng & Units 04/24/2024    8:01 AM 04/11/2024    7:38 AM 03/27/2024    7:41 AM  CBC  WBC 4.0 - 10.5 K/uL 2.5  1.9  1.8   Hemoglobin 12.0 - 15.0 g/dL 16.1  09.6  04.5   Hematocrit 36.0 - 46.0 % 36.3  37.0  35.9   Platelets 150 - 400 K/uL 139  92  122        Latest Ref Rng & Units 04/24/2024    8:01 AM 04/11/2024    7:38 AM 03/27/2024    7:41 AM  CMP  Glucose 70 - 99 mg/dL 409  811  914   BUN 8 - 23 mg/dL 21  21  15    Creatinine 0.44 - 1.00 mg/dL 7.82  9.56  2.13   Sodium 135 - 145 mmol/L 142  141  142   Potassium 3.5 - 5.1 mmol/L 3.9  3.8  3.4   Chloride 98 - 111 mmol/L 109  108  107   CO2 22 - 32 mmol/L 25  24  27    Calcium  8.9 - 10.3 mg/dL 9.3  9.3  8.9   Total Protein 6.5 - 8.1 g/dL 6.3  6.6  6.3   Total Bilirubin 0.0 - 1.2 mg/dL 1.4  1.8  2.2   Alkaline Phos 38 - 126 U/L 53  52  51   AST 15 - 41 U/L 20  19  17    ALT 0 - 44 U/L 19  16  16      Lab Results  Component Value Date   MPROTEIN Not Observed 03/27/2024   MPROTEIN Not Observed 02/28/2024   MPROTEIN Not Observed 02/01/2024   Lab Results  Component Value Date   KPAFRELGTCHN 15.9 03/27/2024   KPAFRELGTCHN 15.4 02/28/2024   KPAFRELGTCHN 14.8 02/01/2024   LAMBDASER 13.7 03/27/2024   LAMBDASER 13.7 02/28/2024   LAMBDASER 14.1 02/01/2024   KAPLAMBRATIO 1.16 03/27/2024   KAPLAMBRATIO 1.12 02/28/2024   KAPLAMBRATIO 1.05 02/01/2024    RADIOGRAPHIC STUDIES: No results found.  ASSESSMENT & PLAN Carol [name] is a 68 y.o. female with medical history significant for multiple myeloma with plasmacytoma who presents for a follow up visit.   # Free Kappa Multiple Myeloma # Plasmacytoma  -- original finding of L2 plasmacytoma on biopsy of back lesion, bone marrow biopsy confirms greater than 60% plasma cells  consistent with multiple myeloma. --Cycle 1 Day 1 of VRD treatment started on 10/26/2021.  --VRD therapy stopped on 11/25/2021 due to rash. Started Dara/Rev/Dex on 12/16/2021.  --will order monthly SPEP, UPEP, SFLC and beta 2 microglobulin --weekly CBC, CMP, and LDH --Cycle 1 Day 1 of Kd started on 01/06/2022 Plan:  --Labs show white blood cell 2.5, hemoglobin 13.0, MCV 86, platelets 139 --Most recent myeloma labs from 09/07/2023 showed no M-protein was undetectable with normal serum free light chains. Continue monthly protein labs with SPEP and serum free light chains. --due to worsening neutropenia, we are holding her Revlimid .  Once her blood counts improved we will restart with dose reduced Revlimid  2.5 mg PO QOD, 21 days on 7 days off.  --Neutropenic precautions given including monitoring for fevers and chills.  ANC at 1.2. --RTC q 4 weeks for labs and 8 weeks for clinic visit   #Neck pain: --Present for the last month involving more the left lateral side with decreased mobility --CT cervical spine did not show any acute abnormalities on 04/19/2023.   #Supportive Care -- chemotherapy education complete  -- port placement complete. -- sent prescription for EMLA  cream -- zofran  8mg  q8H PRN and compazine  10mg  PO q6H for nausea -- Started zometa  on 02/03/2022. Will continue q 3 months.HOLD zometa  in June 2023 due to upcoming spine surgery. Last dose today, next due Sept 2025.  -- no pain medication required at this time.   No orders of the defined types were placed in this encounter.  All questions were answered. The patient knows to call the clinic with any problems, questions or concerns.  I have spent a total of 30 minutes minutes of face-to-face and non-face-to-face time, preparing to see the patient, performing a medically appropriate examination, counseling and educating the patient, ordering medications/tests, documenting clinical information in the electronic health record, and care  coordination.   Rogerio Clay, MD Department of Hematology/Oncology [facility] Phone: [phone] Pager: [phone] Email: [email or web address]   04/24/2024 3:09 PM

## 2024-04-25 LAB — KAPPA/LAMBDA LIGHT CHAINS
Kappa free light chain: 12 mg/L (ref 3.3–19.4)
Kappa, lambda light chain ratio: 0.98 (ref 0.26–1.65)
Lambda free light chains: 12.3 mg/L (ref 5.7–26.3)

## 2024-04-28 LAB — MULTIPLE MYELOMA PANEL, SERUM
Albumin SerPl Elph-Mcnc: 4.1 g/dL (ref 2.9–4.4)
Albumin/Glob SerPl: 2.2 — ABNORMAL HIGH (ref 0.7–1.7)
Alpha 1: 0.2 g/dL (ref 0.0–0.4)
Alpha2 Glob SerPl Elph-Mcnc: 0.5 g/dL (ref 0.4–1.0)
B-Globulin SerPl Elph-Mcnc: 0.8 g/dL (ref 0.7–1.3)
Gamma Glob SerPl Elph-Mcnc: 0.4 g/dL (ref 0.4–1.8)
Globulin, Total: 1.9 g/dL — ABNORMAL LOW (ref 2.2–3.9)
IgA: 75 mg/dL — ABNORMAL LOW (ref 87–352)
IgG (Immunoglobin G), Serum: 515 mg/dL — ABNORMAL LOW (ref 586–1602)
IgM (Immunoglobulin M), Srm: 26 mg/dL (ref 26–217)
Total Protein ELP: 6 g/dL (ref 6.0–8.5)

## 2024-05-07 ENCOUNTER — Other Ambulatory Visit: Payer: Self-pay | Admitting: *Deleted

## 2024-05-07 ENCOUNTER — Other Ambulatory Visit: Payer: Self-pay | Admitting: Hematology and Oncology

## 2024-05-07 NOTE — Progress Notes (Signed)
 ERROR

## 2024-05-08 ENCOUNTER — Inpatient Hospital Stay

## 2024-05-08 DIAGNOSIS — Z95828 Presence of other vascular implants and grafts: Secondary | ICD-10-CM

## 2024-05-08 DIAGNOSIS — C9000 Multiple myeloma not having achieved remission: Secondary | ICD-10-CM | POA: Diagnosis not present

## 2024-05-08 LAB — CMP (CANCER CENTER ONLY)
ALT: 16 U/L (ref 0–44)
AST: 17 U/L (ref 15–41)
Albumin: 4.3 g/dL (ref 3.5–5.0)
Alkaline Phosphatase: 58 U/L (ref 38–126)
Anion gap: 7 (ref 5–15)
BUN: 20 mg/dL (ref 8–23)
CO2: 26 mmol/L (ref 22–32)
Calcium: 9.1 mg/dL (ref 8.9–10.3)
Chloride: 110 mmol/L (ref 98–111)
Creatinine: 1.09 mg/dL — ABNORMAL HIGH (ref 0.44–1.00)
GFR, Estimated: 55 mL/min — ABNORMAL LOW (ref >60)
Glucose, Bld: 110 mg/dL — ABNORMAL HIGH (ref 70–99)
Potassium: 4 mmol/L (ref 3.5–5.1)
Sodium: 143 mmol/L (ref 135–145)
Total Bilirubin: 1.1 mg/dL (ref 0.0–1.2)
Total Protein: 6.2 g/dL — ABNORMAL LOW (ref 6.5–8.1)

## 2024-05-08 LAB — CBC WITH DIFFERENTIAL (CANCER CENTER ONLY)
Abs Immature Granulocytes: 0.01 10*3/uL (ref 0.00–0.07)
Basophils Absolute: 0 10*3/uL (ref 0.0–0.1)
Basophils Relative: 0 %
Eosinophils Absolute: 0.1 10*3/uL (ref 0.0–0.5)
Eosinophils Relative: 2 %
HCT: 35.5 % — ABNORMAL LOW (ref 36.0–46.0)
Hemoglobin: 12.8 g/dL (ref 12.0–15.0)
Immature Granulocytes: 0 %
Lymphocytes Relative: 36 %
Lymphs Abs: 1 10*3/uL (ref 0.7–4.0)
MCH: 31.2 pg (ref 26.0–34.0)
MCHC: 36.1 g/dL — ABNORMAL HIGH (ref 30.0–36.0)
MCV: 86.6 fL (ref 80.0–100.0)
Monocytes Absolute: 0.2 10*3/uL (ref 0.1–1.0)
Monocytes Relative: 9 %
Neutro Abs: 1.5 10*3/uL — ABNORMAL LOW (ref 1.7–7.7)
Neutrophils Relative %: 53 %
Platelet Count: 101 10*3/uL — ABNORMAL LOW (ref 150–400)
RBC: 4.1 MIL/uL (ref 3.87–5.11)
RDW: 13.9 % (ref 11.5–15.5)
WBC Count: 2.8 10*3/uL — ABNORMAL LOW (ref 4.0–10.5)
nRBC: 0 % (ref 0.0–0.2)

## 2024-05-08 MED ORDER — HEPARIN SOD (PORK) LOCK FLUSH 100 UNIT/ML IV SOLN
500.0000 [IU] | Freq: Once | INTRAVENOUS | Status: AC
  Administered 2024-05-08: 500 [IU]

## 2024-05-08 MED ORDER — SODIUM CHLORIDE 0.9% FLUSH
10.0000 mL | Freq: Once | INTRAVENOUS | Status: AC
  Administered 2024-05-08: 10 mL

## 2024-05-09 ENCOUNTER — Telehealth: Payer: Self-pay | Admitting: *Deleted

## 2024-05-09 NOTE — Telephone Encounter (Signed)
 TCT patient regarding her lab results from yesterday and her Revlimid . No answer. Was able to leave vm message for pt to call back at her convenience @ [phone]. Pt's WBC/ANC remain low. She is to continue to hold her Revlimid  for now. She will have her labs rechecked on 05/21/24

## 2024-05-14 NOTE — Telephone Encounter (Signed)
 Received call back from pt. Advised to continue to hold Revlimid  until next lab draw to check WBC. Pt voiced understanding.

## 2024-05-20 ENCOUNTER — Other Ambulatory Visit: Payer: Self-pay | Admitting: Physician Assistant

## 2024-05-20 DIAGNOSIS — C9001 Multiple myeloma in remission: Secondary | ICD-10-CM

## 2024-05-21 ENCOUNTER — Inpatient Hospital Stay

## 2024-05-21 ENCOUNTER — Other Ambulatory Visit: Payer: Self-pay | Admitting: *Deleted

## 2024-05-21 ENCOUNTER — Inpatient Hospital Stay: Attending: Radiation Oncology | Admitting: Physician Assistant

## 2024-05-21 VITALS — BP 107/59 | HR 68 | Temp 97.3°F | Resp 20 | Wt 195.3 lb

## 2024-05-21 DIAGNOSIS — Z7961 Long term (current) use of immunomodulator: Secondary | ICD-10-CM | POA: Insufficient documentation

## 2024-05-21 DIAGNOSIS — Z79899 Other long term (current) drug therapy: Secondary | ICD-10-CM | POA: Diagnosis not present

## 2024-05-21 DIAGNOSIS — Z95828 Presence of other vascular implants and grafts: Secondary | ICD-10-CM

## 2024-05-21 DIAGNOSIS — M542 Cervicalgia: Secondary | ICD-10-CM | POA: Diagnosis not present

## 2024-05-21 DIAGNOSIS — C9000 Multiple myeloma not having achieved remission: Secondary | ICD-10-CM | POA: Diagnosis present

## 2024-05-21 DIAGNOSIS — C9001 Multiple myeloma in remission: Secondary | ICD-10-CM | POA: Diagnosis not present

## 2024-05-21 DIAGNOSIS — Z7982 Long term (current) use of aspirin: Secondary | ICD-10-CM | POA: Diagnosis not present

## 2024-05-21 LAB — CBC WITH DIFFERENTIAL (CANCER CENTER ONLY)
Abs Immature Granulocytes: 0.01 10*3/uL (ref 0.00–0.07)
Basophils Absolute: 0 10*3/uL (ref 0.0–0.1)
Basophils Relative: 0 %
Eosinophils Absolute: 0 10*3/uL (ref 0.0–0.5)
Eosinophils Relative: 1 %
HCT: 37.2 % (ref 36.0–46.0)
Hemoglobin: 13.3 g/dL (ref 12.0–15.0)
Immature Granulocytes: 0 %
Lymphocytes Relative: 36 %
Lymphs Abs: 1.2 10*3/uL (ref 0.7–4.0)
MCH: 31.7 pg (ref 26.0–34.0)
MCHC: 35.8 g/dL (ref 30.0–36.0)
MCV: 88.8 fL (ref 80.0–100.0)
Monocytes Absolute: 0.2 10*3/uL (ref 0.1–1.0)
Monocytes Relative: 7 %
Neutro Abs: 1.8 10*3/uL (ref 1.7–7.7)
Neutrophils Relative %: 56 %
Platelet Count: 146 10*3/uL — ABNORMAL LOW (ref 150–400)
RBC: 4.19 MIL/uL (ref 3.87–5.11)
RDW: 14.6 % (ref 11.5–15.5)
WBC Count: 3.2 10*3/uL — ABNORMAL LOW (ref 4.0–10.5)
nRBC: 0 % (ref 0.0–0.2)

## 2024-05-21 LAB — CMP (CANCER CENTER ONLY)
ALT: 19 U/L (ref 0–44)
AST: 20 U/L (ref 15–41)
Albumin: 4.6 g/dL (ref 3.5–5.0)
Alkaline Phosphatase: 59 U/L (ref 38–126)
Anion gap: 7 (ref 5–15)
BUN: 17 mg/dL (ref 8–23)
CO2: 29 mmol/L (ref 22–32)
Calcium: 9.5 mg/dL (ref 8.9–10.3)
Chloride: 108 mmol/L (ref 98–111)
Creatinine: 1.08 mg/dL — ABNORMAL HIGH (ref 0.44–1.00)
GFR, Estimated: 56 mL/min — ABNORMAL LOW (ref >60)
Glucose, Bld: 105 mg/dL — ABNORMAL HIGH (ref 70–99)
Potassium: 3.8 mmol/L (ref 3.5–5.1)
Sodium: 144 mmol/L (ref 135–145)
Total Bilirubin: 1.2 mg/dL (ref 0.0–1.2)
Total Protein: 6.7 g/dL (ref 6.5–8.1)

## 2024-05-21 LAB — LACTATE DEHYDROGENASE: LDH: 186 U/L (ref 98–192)

## 2024-05-21 MED ORDER — LENALIDOMIDE 2.5 MG PO CAPS: 2.5000 mg | ORAL_CAPSULE | ORAL | 0 refills | Status: DC

## 2024-05-21 NOTE — Progress Notes (Signed)
 [facility] [phone]   [phone]  PROGRESS NOTE  Patient Care Team: Vaughn Lauraine KATHEE DEVONNA as PCP - General (Physician Assistant)  Hematological/Oncological History # Plasmacytoma with Multiple Myeloma  07/29/2021: CT cervical spine shows expansile lytic lesion which almost completely replaces the normal C2 vertebral body  08/24/2021: PET CT scan shows increased metabolic activity at C2 and C7  08/25/2021: biopsy of C2 lesion showed finding consistent with plasma cell neoplasm.  09/08/2021: start radiation therapy 09/12/2021: establish care with Dr. Federico  09/28/2021: end radiation therapy 10/10/2021: Bone marrow biopsy. Results confirm plasma cell neoplasm consistent with multiple myeloma, kappa restricted.  10/26/2021: Cycle 1 Day 1 of VRD 11/18/2021: Cycle 2 Day 1 of VRD 11/25/2021: Velcade  HELD due to full body rash. Resolved with steroid therapy.  12/16/2021: Cycle 1 Day 1 of Dara/Rev/Dex 12/30/2021: HELD Daratumumab  due to toxicity. Patient unable to tolerate. Requested holding the medication  01/06/2022: Cycle 1 Day 1 of Kd  02/03/2022: Cycle 2 Day 1 of Kd 03/03/2022: Cycle 3 Day 1 of Kd 03/31/2022: Cycle 4 Day 1 of Kd 04/28/2022: Cycle 5 Day 1 of Kd 05/26/2022: Cycle 6 Day 1 of Kd  06/30/2022: Cycle 7 Day 1 of Kd  07/20/2022: Cycle 8 Day 1 of Kd  Sept 2023: start of maintenance revlimid  10 mg PO daily 21 of 28 day cycles.  01/05/2023: HELD Revlimid  due to cytopenias. 06/07/2023: restart maintenance revlimid  5 mg PO daily 21 days of 28 days.  09/07/2023: Dose reduce maintenance revlimid  to 2.5 mg PO daily 21 days on 7 days off.  03/2024: HELD Revlimid  due to cytopenias.  05/21/2024: Resume Revlimid  dose reduce 2.5 mg PO EOD 21 days on 7 days off.   Interval History:  Carol [name] 68 y.o. female with medical history significant for multiple myeloma with plasmacytoma who presents for a follow up visit. The patient's last visit was on 04/24/2024.  In the  interim since her last visit she has continued to hold Revlimid .    On exam today Carol [name] reports she is doing well without any new symptoms.  Her energy and appetite are overall stable.  She is able to complete all her daily activities on her own.  She denies nausea, vomiting or bowel habit changes.  She denies easy bruising or signs of active bleeding.  She denies fevers, chills, night sweats, shortness of breath, chest pain or cough. A full 10 point ROS is otherwise negative.   MEDICAL HISTORY:  Past Medical History:  Diagnosis Date   Allergy    Anemia    Ankle edema    both ankles   Arthritis    hands,knees,elbows   Cancer (HCC) 2016   tumor kidney   Diabetes mellitus    type 2    Eczema    History of kidney stones    Hypothyroidism    Obesity    Sleep apnea    cpap machine setting of 3   Thyroid disease     SURGICAL HISTORY: Past Surgical History:  Procedure Laterality Date   BREAST BIOPSY Right 2001   CHOLECYSTECTOMY  2009   open   COLONOSCOPY     CYSTOSCOPY W/ RETROGRADES Left 11/22/2018   Procedure: CYSTOSCOPY WITH RETROGRADE PYELOGRAM;  Surgeon: Cam Morene ORN, MD;  Location: [facility];  Service: Urology;  Laterality: Left;   CYSTOSCOPY WITH RETROGRADE PYELOGRAM, URETEROSCOPY AND STENT PLACEMENT Right 10/03/2021   Procedure: CYSTOSCOPY WITH RETROGRADE PYELOGRAM, URETEROSCOPY,STONE EXTRACTION AND STENT PLACEMENT;  Surgeon:  Devere Lonni Righter, MD;  Location: [facility];  Service: Urology;  Laterality: Right;   DILATATION & CURRETTAGE/HYSTEROSCOPY WITH RESECTOCOPE N/A 01/21/2013   Procedure: DILATATION & CURETTAGE/HYSTEROSCOPY WITH RESECTOCOPE AND RESECTION OF ENDOMETRIAL;  Surgeon: Carlin LELON Forbes, MD;  Location: [facility];  Service: Gynecology;  Laterality: N/A;   HARDWARE REMOVAL N/A 06/05/2022   Procedure: Removal of Posterior Cervical Hardware from Occiput to Cervical Five;  Surgeon: Cheryle Debby LABOR, MD;  Location: [facility] OR;  Service:  Neurosurgery;  Laterality: N/A;   IR IMAGING GUIDED PORT INSERTION  02/01/2022   KNEE ARTHROSCOPY     left    POLYPECTOMY     POSTERIOR CERVICAL FUSION/FORAMINOTOMY N/A 08/25/2021   Procedure: Occiput to Cervical 5 Posterior cervical instrumented fusion with open biopsy of Cervical 2 Mass. Cervical Two Ganglionectomy;  Surgeon: Cheryle Debby LABOR, MD;  Location: [facility];  Service: Neurosurgery;  Laterality: N/A;   ROBOT ASSISTED PYELOPLASTY Left 11/22/2018   Procedure: XI ROBOTIC ASSISTED ATTEMPTED PYELOPLASTY CONVERTED TO NEPHRECTOMY/LYSIS OF ADHESIONS/UMBILICAL HERNIA REPAIR;  Surgeon: Cam Morene LELON, MD;  Location: [facility];  Service: Urology;  Laterality: Left;   ROBOTIC ASSITED PARTIAL NEPHRECTOMY Left 06/11/2015   Procedure: LEFT ROBOTIC ASSISTED LAPAROSCOPIC  PARTIAL NEPHRECTOMY;  Surgeon: Morene LELON Cam, MD;  Location: [facility];  Service: Urology;  Laterality: Left;   ROTATOR CUFF REPAIR Left 2011   URETERAL BIOPSY Left 02/14/2017   Procedure: URETERAL BIOPSY;  Surgeon: Morene LELON Cam, MD;  Location: [facility];  Service: Urology;  Laterality: Left;   URETEROSCOPY WITH HOLMIUM LASER LITHOTRIPSY Left 02/14/2017   Procedure: URETEROSCOPY LITHOTRIPSY, URETERAL BALLOON DILATION;  Surgeon: Morene LELON Cam, MD;  Location: [facility];  Service: Urology;  Laterality: Left;  With STENT    SOCIAL HISTORY: Social History   Socioeconomic History   Marital status: Married    Spouse name: Not on file   Number of children: Not on file   Years of education: Not on file   Highest education level: Not on file  Occupational History   Not on file  Tobacco Use   Smoking status: Never   Smokeless tobacco: Never  Vaping Use   Vaping status: Never Used  Substance and Sexual Activity   Alcohol use: Yes    Comment: occ   Drug use: No   Sexual activity: Not on file  Other Topics Concern   Not on file  Social History Narrative   Not on file   Social Drivers of Health   Financial Resource  Strain: Low Risk  (12/24/2023)   Received from [facility]   Overall Financial Resource Strain (CARDIA)    Difficulty of Paying Living Expenses: Not very hard  Food Insecurity: No Food Insecurity (12/24/2023)   Received from [facility]   Hunger Vital Sign    Within the past 12 months, you worried that your food would run out before you got the money to buy more.: Never true    Within the past 12 months, the food you bought just didn't last and you didn't have money to get more.: Never true  Transportation Needs: No Transportation Needs (12/24/2023)   Received from [facility] - Transportation    Lack of Transportation (Medical): No    Lack of Transportation (Non-Medical): No  Physical Activity: Unknown (12/24/2023)   Received from [facility]   Exercise Vital Sign    On average, how many days per week do you engage in moderate to strenuous exercise (like  a brisk walk)?: 0 days    Minutes of Exercise per Session: Not on file  Stress: Stress Concern Present (12/24/2023)   Received from [facility] - Occupational Stress Questionnaire    Feeling of Stress : To some extent  Social Connections: Socially Integrated (12/24/2023)   Received from [facility]   Social Network    How would you rate your social network (family, work, friends)?: Good participation with social networks  Intimate Partner Violence: Not At Risk (12/24/2023)   Received from [facility]   HITS    Over the last 12 months how often did your partner physically hurt you?: Never    Over the last 12 months how often did your partner insult you or talk down to you?: Never    Over the last 12 months how often did your partner threaten you with physical harm?: Never    Over the last 12 months how often did your partner scream or curse at you?: Never    FAMILY HISTORY: Family History  Problem Relation Age of Onset   Cancer Father     ALLERGIES:  is allergic to  morphine .  MEDICATIONS:  Current Outpatient Medications  Medication Sig Dispense Refill   Ascorbic Acid (VITAMIN C ADULT GUMMIES PO) Take 1 tablet by mouth daily.     ASPIRIN  LOW DOSE 81 MG tablet TAKE 1 TABLET (81 MG TOTAL) BY MOUTH DAILY. SWALLOW WHOLE. 90 tablet 3   atorvastatin  (LIPITOR) 20 MG tablet Take 20 mg by mouth at bedtime.     Calcium  Carbonate-Vitamin D  600-5 MG-MCG CAPS Take 1 capsule by mouth every morning.     Cholecalciferol (VITAMIN D3 PO) Take by mouth.     Cyanocobalamin (VITAMIN B12) 1000 MCG TBCR Take 1 tablet by mouth every morning.     furosemide  (LASIX ) 20 MG tablet Take 20 mg by mouth daily.     KLOR-CON  M20 20 MEQ tablet TAKE 1 TABLET BY MOUTH EVERY DAY 30 tablet 1   lenalidomide  (REVLIMID ) 2.5 MG capsule Take 1 capsule (2.5 mg total) by mouth daily. Auth # [id number]   Date Obtained 04/02/24 Take one capsule daily for 21 days then none for 7 days 21 capsule 0   levothyroxine  (SYNTHROID ) 50 MCG tablet Take 50 mcg by mouth daily before breakfast.     lidocaine -prilocaine  (EMLA ) cream Apply 1 Application topically as needed. 30 g 0   Magnesium Hydroxide (MILK OF MAGNESIA PO) Take 15 mLs by mouth daily as needed (constipation/diarrhea).     ondansetron  (ZOFRAN ) 8 MG tablet Take 1 tablet (8 mg total) by mouth every 8 (eight) hours as needed. 30 tablet 0   OZEMPIC , 0.25 OR 0.5 MG/DOSE, 2 MG/1.5ML SOPN Inject 0.5 mg into the skin every Saturday.     prednisoLONE acetate (PRED FORTE) 1 % ophthalmic suspension Place 1 drop into the left eye 4 (four) times daily.     PROAIR  HFA 108 (90 BASE) MCG/ACT inhaler Inhale 2 puffs into the lungs every 4 (four) hours as needed for wheezing or shortness of breath. Uses seasonal  3   prochlorperazine  (COMPAZINE ) 10 MG tablet Take 1 tablet (10 mg total) by mouth every 6 (six) hours as needed for nausea or vomiting. 30 tablet 0   PROLENSA 0.07 % SOLN Place 1 drop into the right eye at bedtime.     Sennosides (SENOKOT PO) Take 1 tablet by  mouth daily as needed (constipation). Bid     No  current facility-administered medications for this visit.    REVIEW OF SYSTEMS:   Constitutional: ( - ) fevers, ( - )  chills , ( - ) night sweats Eyes: ( - ) blurriness of vision, ( - ) double vision, ( - ) watery eyes Ears, nose, mouth, throat, and face: ( - ) mucositis, ( - ) sore throat Respiratory: ( - ) cough, ( - ) dyspnea, ( - ) wheezes Cardiovascular: ( - ) palpitation, ( - ) chest discomfort, ( - ) lower extremity swelling Gastrointestinal:  ( - ) nausea, ( - ) heartburn, ( -) change in bowel habits Skin: ( - ) abnormal skin rashes Lymphatics: ( - ) new lymphadenopathy, ( - ) easy bruising Neurological: ( - ) numbness, ( - ) tingling, ( - ) new weaknesses Behavioral/Psych: ( - ) mood change, ( - ) new changes  All other systems were reviewed with the patient and are negative.  PHYSICAL EXAMINATION: ECOG PERFORMANCE STATUS: 0 - Asymptomatic  Vitals:   05/21/24 1515  BP: (!) 107/59  Pulse: 68  Resp: 20  Temp: (!) 97.3 F (36.3 C)  SpO2: 99%       Filed Weights   05/21/24 1515  Weight: 195 lb 4.8 oz (88.6 kg)    GENERAL: Well-appearing middle-age Caucasian female alert, no distress and comfortable SKIN: No evidence of rash, erythema, dry skin, or raised lesions. EYES: conjunctiva are pink and non-injected, sclera clear LUNGS: clear to auscultation and percussion with normal breathing effort HEART: regular rate & rhythm and no murmurs and no lower extremity edema Musculoskeletal: no cyanosis of digits and no clubbing  PSYCH: alert & oriented x 3, fluent speech NEURO: no focal motor/sensory deficits  LABORATORY DATA:  I have reviewed the data as listed    Latest Ref Rng & Units 05/21/2024    2:59 PM 05/08/2024    7:55 AM 04/24/2024    8:01 AM  CBC  WBC 4.0 - 10.5 K/uL 3.2  2.8  2.5   Hemoglobin 12.0 - 15.0 g/dL 86.6  87.1  86.9   Hematocrit 36.0 - 46.0 % 37.2  35.5  36.3   Platelets 150 - 400 K/uL 146  101   139        Latest Ref Rng & Units 05/21/2024    2:59 PM 05/08/2024    7:55 AM 04/24/2024    8:01 AM  CMP  Glucose 70 - 99 mg/dL 894  889  886   BUN 8 - 23 mg/dL 17  20  21    Creatinine 0.44 - 1.00 mg/dL 8.91  8.90  8.93   Sodium 135 - 145 mmol/L 144  143  142   Potassium 3.5 - 5.1 mmol/L 3.8  4.0  3.9   Chloride 98 - 111 mmol/L 108  110  109   CO2 22 - 32 mmol/L 29  26  25    Calcium  8.9 - 10.3 mg/dL 9.5  9.1  9.3   Total Protein 6.5 - 8.1 g/dL 6.7  6.2  6.3   Total Bilirubin 0.0 - 1.2 mg/dL 1.2  1.1  1.4   Alkaline Phos 38 - 126 U/L 59  58  53   AST 15 - 41 U/L 20  17  20    ALT 0 - 44 U/L 19  16  19      Lab Results  Component Value Date   MPROTEIN Not Observed 04/24/2024   MPROTEIN Not Observed 03/27/2024   MPROTEIN Not Observed 02/28/2024  Lab Results  Component Value Date   KPAFRELGTCHN 12.0 04/24/2024   KPAFRELGTCHN 15.9 03/27/2024   KPAFRELGTCHN 15.4 02/28/2024   LAMBDASER 12.3 04/24/2024   LAMBDASER 13.7 03/27/2024   LAMBDASER 13.7 02/28/2024   KAPLAMBRATIO 0.98 04/24/2024   KAPLAMBRATIO 1.16 03/27/2024   KAPLAMBRATIO 1.12 02/28/2024    RADIOGRAPHIC STUDIES: No results found.  ASSESSMENT & PLAN HARJIT DOUDS is a 68 y.o. female with medical history significant for multiple myeloma with plasmacytoma who presents for a follow up visit.   # Free Kappa Multiple Myeloma # Plasmacytoma  -- original finding of L2 plasmacytoma on biopsy of back lesion, bone marrow biopsy confirms greater than 60% plasma cells consistent with multiple myeloma. --Cycle 1 Day 1 of VRD treatment started on 10/26/2021.  --VRD therapy stopped on 11/25/2021 due to rash. Started Dara/Rev/Dex on 12/16/2021.  --will order monthly SPEP, UPEP, SFLC and beta 2 microglobulin --weekly CBC, CMP, and LDH --Cycle 1 Day 1 of Kd started on 01/06/2022 Plan:  --Labs show white blood cell 3.2, hemoglobin 13.3, MCV 88.8, platelets 146, ANC 1.8.  --Most recent myeloma labs from 04/24/2024 showed no M-protein  was undetectable with normal serum free light chains. Continue monthly protein labs with SPEP and serum free light chains. --Since blood counts have recovered, we will restart with dose reduced Revlimid  2.5 mg PO QOD, 21 days on 7 days off.  --RTC q 4 weeks for labs and 8 weeks for clinic visit   #Port-a-cath issue: --Unable to draw blood from port.  --Will request IR evaluation  #Neck pain: --Present for the last month involving more the left lateral side with decreased mobility --CT cervical spine did not show any acute abnormalities on 04/19/2023.   #Supportive Care -- chemotherapy education complete  -- port placement complete. -- sent prescription for EMLA  cream -- zofran  8mg  q8H PRN and compazine  10mg  PO q6H for nausea -- Started zometa  on 02/03/2022. Will continue q 3 months. Next due Sept 2025.  -- no pain medication required at this time.   Orders Placed This Encounter  Procedures   IR Port Repair Central Venous Access Device    Standing Status:   Future    Expiration Date:   05/21/2025    Reason for Exam (SYMPTOM  OR DIAGNOSIS REQUIRED):   port not drawing blood    Preferred Imaging Location?:   [facility]   All questions were answered. The patient knows to call the clinic with any problems, questions or concerns.  I have spent a total of 30 minutes minutes of face-to-face and non-face-to-face time, preparing to see the patient, performing a medically appropriate examination, counseling and educating the patient, ordering medications/tests, documenting clinical information in the electronic health record, and care coordination.   Johnston Police PA-C Dept of Hematology and Oncology [facility] Phone: [phone]    05/21/2024 3:51 PM

## 2024-05-22 ENCOUNTER — Inpatient Hospital Stay

## 2024-05-22 ENCOUNTER — Ambulatory Visit (HOSPITAL_COMMUNITY)
Admission: RE | Admit: 2024-05-22 | Discharge: 2024-05-22 | Disposition: A | Discharge status: Home / Self Care | Source: Ambulatory Visit | Attending: Physician Assistant | Admitting: Physician Assistant

## 2024-05-22 DIAGNOSIS — T859XXA Unspecified complication of internal prosthetic device, implant and graft, initial encounter: Secondary | ICD-10-CM | POA: Insufficient documentation

## 2024-05-22 DIAGNOSIS — Z95828 Presence of other vascular implants and grafts: Secondary | ICD-10-CM

## 2024-05-22 HISTORY — PX: IR CV LINE INJECTION: IMG2294

## 2024-05-22 LAB — KAPPA/LAMBDA LIGHT CHAINS
Kappa free light chain: 11.2 mg/L (ref 3.3–19.4)
Kappa, lambda light chain ratio: 0.96 (ref 0.26–1.65)
Lambda free light chains: 11.7 mg/L (ref 5.7–26.3)

## 2024-05-22 MED ORDER — IOHEXOL 300 MG/ML  SOLN
50.0000 mL | Freq: Once | INTRAMUSCULAR | Status: AC | PRN
  Administered 2024-05-22: 10 mL via INTRAVENOUS

## 2024-05-22 MED ORDER — HEPARIN SOD (PORK) LOCK FLUSH 100 UNIT/ML IV SOLN
500.0000 [IU] | Freq: Once | INTRAVENOUS | Status: AC
  Administered 2024-05-22: 500 [IU] via INTRAVENOUS

## 2024-05-22 MED ORDER — HEPARIN SOD (PORK) LOCK FLUSH 100 UNIT/ML IV SOLN
INTRAVENOUS | Status: AC
  Filled 2024-05-22: qty 5

## 2024-05-26 LAB — MULTIPLE MYELOMA PANEL, SERUM
Albumin SerPl Elph-Mcnc: 4.4 g/dL (ref 2.9–4.4)
Albumin/Glob SerPl: 2.3 — ABNORMAL HIGH (ref 0.7–1.7)
Alpha 1: 0.2 g/dL (ref 0.0–0.4)
Alpha2 Glob SerPl Elph-Mcnc: 0.5 g/dL (ref 0.4–1.0)
B-Globulin SerPl Elph-Mcnc: 0.8 g/dL (ref 0.7–1.3)
Gamma Glob SerPl Elph-Mcnc: 0.4 g/dL (ref 0.4–1.8)
Globulin, Total: 2 g/dL — ABNORMAL LOW (ref 2.2–3.9)
IgA: 75 mg/dL — ABNORMAL LOW (ref 87–352)
IgG (Immunoglobin G), Serum: 551 mg/dL — ABNORMAL LOW (ref 586–1602)
IgM (Immunoglobulin M), Srm: 33 mg/dL (ref 26–217)
Total Protein ELP: 6.4 g/dL (ref 6.0–8.5)

## 2024-06-19 ENCOUNTER — Inpatient Hospital Stay

## 2024-06-19 DIAGNOSIS — C9000 Multiple myeloma not having achieved remission: Secondary | ICD-10-CM | POA: Diagnosis not present

## 2024-06-19 DIAGNOSIS — Z95828 Presence of other vascular implants and grafts: Secondary | ICD-10-CM

## 2024-06-19 DIAGNOSIS — C9001 Multiple myeloma in remission: Secondary | ICD-10-CM

## 2024-06-19 LAB — CBC WITH DIFFERENTIAL (CANCER CENTER ONLY)
Abs Immature Granulocytes: 0.01 K/uL (ref 0.00–0.07)
Basophils Absolute: 0 K/uL (ref 0.0–0.1)
Basophils Relative: 2 %
Eosinophils Absolute: 0.1 K/uL (ref 0.0–0.5)
Eosinophils Relative: 3 %
HCT: 35.6 % — ABNORMAL LOW (ref 36.0–46.0)
Hemoglobin: 12.4 g/dL (ref 12.0–15.0)
Immature Granulocytes: 0 %
Lymphocytes Relative: 40 %
Lymphs Abs: 1 K/uL (ref 0.7–4.0)
MCH: 30.8 pg (ref 26.0–34.0)
MCHC: 34.8 g/dL (ref 30.0–36.0)
MCV: 88.6 fL (ref 80.0–100.0)
Monocytes Absolute: 0.3 K/uL (ref 0.1–1.0)
Monocytes Relative: 11 %
Neutro Abs: 1.2 K/uL — ABNORMAL LOW (ref 1.7–7.7)
Neutrophils Relative %: 44 %
Platelet Count: 134 K/uL — ABNORMAL LOW (ref 150–400)
RBC: 4.02 MIL/uL (ref 3.87–5.11)
RDW: 13.8 % (ref 11.5–15.5)
WBC Count: 2.6 K/uL — ABNORMAL LOW (ref 4.0–10.5)
nRBC: 0 % (ref 0.0–0.2)

## 2024-06-19 LAB — CMP (CANCER CENTER ONLY)
ALT: 14 U/L (ref 0–44)
AST: 14 U/L — ABNORMAL LOW (ref 15–41)
Albumin: 3.9 g/dL (ref 3.5–5.0)
Alkaline Phosphatase: 70 U/L (ref 38–126)
Anion gap: 8 (ref 5–15)
BUN: 16 mg/dL (ref 8–23)
CO2: 23 mmol/L (ref 22–32)
Calcium: 8.4 mg/dL — ABNORMAL LOW (ref 8.9–10.3)
Chloride: 113 mmol/L — ABNORMAL HIGH (ref 98–111)
Creatinine: 0.98 mg/dL (ref 0.44–1.00)
GFR, Estimated: >60 mL/min (ref >60)
Glucose, Bld: 108 mg/dL — ABNORMAL HIGH (ref 70–99)
Potassium: 4.1 mmol/L (ref 3.5–5.1)
Sodium: 144 mmol/L (ref 135–145)
Total Bilirubin: 0.9 mg/dL (ref 0.0–1.2)
Total Protein: 6.1 g/dL — ABNORMAL LOW (ref 6.5–8.1)

## 2024-06-19 LAB — LACTATE DEHYDROGENASE: LDH: 173 U/L (ref 98–192)

## 2024-06-19 MED ORDER — HEPARIN SOD (PORK) LOCK FLUSH 100 UNIT/ML IV SOLN
500.0000 [IU] | Freq: Once | INTRAVENOUS | Status: AC
Start: 2024-06-19 — End: 2024-06-19
  Administered 2024-06-19: 500 [IU]

## 2024-06-19 MED ORDER — SODIUM CHLORIDE 0.9% FLUSH
10.0000 mL | Freq: Once | INTRAVENOUS | Status: AC
Start: 2024-06-19 — End: 2024-06-19
  Administered 2024-06-19: 10 mL

## 2024-06-20 LAB — KAPPA/LAMBDA LIGHT CHAINS
Kappa free light chain: 11.9 mg/L (ref 3.3–19.4)
Kappa, lambda light chain ratio: 0.96 (ref 0.26–1.65)
Lambda free light chains: 12.4 mg/L (ref 5.7–26.3)

## 2024-06-22 LAB — MULTIPLE MYELOMA PANEL, SERUM
Albumin SerPl Elph-Mcnc: 4 g/dL (ref 2.9–4.4)
Albumin/Glob SerPl: 2.6 — ABNORMAL HIGH (ref 0.7–1.7)
Alpha 1: 0.2 g/dL (ref 0.0–0.4)
Alpha2 Glob SerPl Elph-Mcnc: 0.4 g/dL (ref 0.4–1.0)
B-Globulin SerPl Elph-Mcnc: 0.7 g/dL (ref 0.7–1.3)
Gamma Glob SerPl Elph-Mcnc: 0.3 g/dL — ABNORMAL LOW (ref 0.4–1.8)
Globulin, Total: 1.6 g/dL — ABNORMAL LOW (ref 2.2–3.9)
IgA: 67 mg/dL — ABNORMAL LOW (ref 87–352)
IgG (Immunoglobin G), Serum: 462 mg/dL — ABNORMAL LOW (ref 586–1602)
IgM (Immunoglobulin M), Srm: 29 mg/dL (ref 26–217)
Total Protein ELP: 5.6 g/dL — ABNORMAL LOW (ref 6.0–8.5)

## 2024-06-24 ENCOUNTER — Other Ambulatory Visit: Payer: Self-pay | Admitting: Hematology and Oncology

## 2024-06-25 ENCOUNTER — Other Ambulatory Visit: Payer: Self-pay | Admitting: *Deleted

## 2024-06-25 MED ORDER — LENALIDOMIDE 2.5 MG PO CAPS: 2.5000 mg | ORAL_CAPSULE | ORAL | 0 refills | Status: DC

## 2024-07-17 ENCOUNTER — Inpatient Hospital Stay

## 2024-07-17 ENCOUNTER — Inpatient Hospital Stay: Attending: Radiation Oncology | Admitting: Hematology and Oncology

## 2024-07-17 ENCOUNTER — Other Ambulatory Visit: Payer: Self-pay | Admitting: Hematology and Oncology

## 2024-07-17 VITALS — BP 126/81 | HR 73 | Temp 97.3°F | Resp 14 | Wt 202.0 lb

## 2024-07-17 DIAGNOSIS — Z7961 Long term (current) use of immunomodulator: Secondary | ICD-10-CM | POA: Diagnosis not present

## 2024-07-17 DIAGNOSIS — Z7982 Long term (current) use of aspirin: Secondary | ICD-10-CM | POA: Insufficient documentation

## 2024-07-17 DIAGNOSIS — Z79899 Other long term (current) drug therapy: Secondary | ICD-10-CM | POA: Diagnosis not present

## 2024-07-17 DIAGNOSIS — C9000 Multiple myeloma not having achieved remission: Secondary | ICD-10-CM | POA: Insufficient documentation

## 2024-07-17 DIAGNOSIS — Z95828 Presence of other vascular implants and grafts: Secondary | ICD-10-CM

## 2024-07-17 DIAGNOSIS — C9001 Multiple myeloma in remission: Secondary | ICD-10-CM

## 2024-07-17 LAB — CBC WITH DIFFERENTIAL (CANCER CENTER ONLY)
Abs Immature Granulocytes: 0.01 K/uL (ref 0.00–0.07)
Basophils Absolute: 0 K/uL (ref 0.0–0.1)
Basophils Relative: 1 %
Eosinophils Absolute: 0.1 K/uL (ref 0.0–0.5)
Eosinophils Relative: 2 %
HCT: 35.3 % — ABNORMAL LOW (ref 36.0–46.0)
Hemoglobin: 12.7 g/dL (ref 12.0–15.0)
Immature Granulocytes: 0 %
Lymphocytes Relative: 28 %
Lymphs Abs: 0.9 K/uL (ref 0.7–4.0)
MCH: 31.4 pg (ref 26.0–34.0)
MCHC: 36 g/dL (ref 30.0–36.0)
MCV: 87.2 fL (ref 80.0–100.0)
Monocytes Absolute: 0.3 K/uL (ref 0.1–1.0)
Monocytes Relative: 11 %
Neutro Abs: 1.8 K/uL (ref 1.7–7.7)
Neutrophils Relative %: 58 %
Platelet Count: 141 K/uL — ABNORMAL LOW (ref 150–400)
RBC: 4.05 MIL/uL (ref 3.87–5.11)
RDW: 13.4 % (ref 11.5–15.5)
WBC Count: 3.1 K/uL — ABNORMAL LOW (ref 4.0–10.5)
nRBC: 0 % (ref 0.0–0.2)

## 2024-07-17 LAB — CMP (CANCER CENTER ONLY)
ALT: 13 U/L (ref 0–44)
AST: 13 U/L — ABNORMAL LOW (ref 15–41)
Albumin: 3.9 g/dL (ref 3.5–5.0)
Alkaline Phosphatase: 66 U/L (ref 38–126)
Anion gap: 6 (ref 5–15)
BUN: 12 mg/dL (ref 8–23)
CO2: 25 mmol/L (ref 22–32)
Calcium: 8.7 mg/dL — ABNORMAL LOW (ref 8.9–10.3)
Chloride: 111 mmol/L (ref 98–111)
Creatinine: 1 mg/dL (ref 0.44–1.00)
GFR, Estimated: >60 mL/min (ref >60)
Glucose, Bld: 106 mg/dL — ABNORMAL HIGH (ref 70–99)
Potassium: 4 mmol/L (ref 3.5–5.1)
Sodium: 142 mmol/L (ref 135–145)
Total Bilirubin: 0.9 mg/dL (ref 0.0–1.2)
Total Protein: 5.8 g/dL — ABNORMAL LOW (ref 6.5–8.1)

## 2024-07-17 LAB — LACTATE DEHYDROGENASE: LDH: 166 U/L (ref 98–192)

## 2024-07-17 MED ORDER — ZOLEDRONIC ACID 4 MG/100ML IV SOLN
4.0000 mg | Freq: Once | INTRAVENOUS | Status: AC
  Administered 2024-07-17: 4 mg via INTRAVENOUS
  Filled 2024-07-17: qty 100

## 2024-07-17 MED ORDER — SODIUM CHLORIDE 0.9 % IV SOLN: INTRAVENOUS | Status: DC

## 2024-07-17 NOTE — Patient Instructions (Signed)

## 2024-07-17 NOTE — Progress Notes (Signed)
 [facility] [phone]   [phone]  PROGRESS NOTE  Patient Care Team: Vaughn Lauraine KATHEE DEVONNA as PCP - General (Physician Assistant)  Hematological/Oncological History # Plasmacytoma with Multiple Myeloma  07/29/2021: CT cervical spine shows expansile lytic lesion which almost completely replaces the normal C2 vertebral body  08/24/2021: PET CT scan shows increased metabolic activity at C2 and C7  08/25/2021: biopsy of C2 lesion showed finding consistent with plasma cell neoplasm.  09/08/2021: start radiation therapy 09/12/2021: establish care with Dr. Federico  09/28/2021: end radiation therapy 10/10/2021: Bone marrow biopsy. Results confirm plasma cell neoplasm consistent with multiple myeloma, kappa restricted.  10/26/2021: Cycle 1 Day 1 of VRD 11/18/2021: Cycle 2 Day 1 of VRD 11/25/2021: Velcade  HELD due to full body rash. Resolved with steroid therapy.  12/16/2021: Cycle 1 Day 1 of Dara/Rev/Dex 12/30/2021: HELD Daratumumab  due to toxicity. Patient unable to tolerate. Requested holding the medication  01/06/2022: Cycle 1 Day 1 of Kd  02/03/2022: Cycle 2 Day 1 of Kd 03/03/2022: Cycle 3 Day 1 of Kd 03/31/2022: Cycle 4 Day 1 of Kd 04/28/2022: Cycle 5 Day 1 of Kd 05/26/2022: Cycle 6 Day 1 of Kd  06/30/2022: Cycle 7 Day 1 of Kd  07/20/2022: Cycle 8 Day 1 of Kd  Sept 2023: start of maintenance revlimid  10 mg PO daily 21 of 28 day cycles.  01/05/2023: HELD Revlimid  due to cytopenias. 06/07/2023: restart maintenance revlimid  5 mg PO daily 21 days of 28 days.  09/07/2023: Dose reduce maintenance revlimid  to 2.5 mg PO daily 21 days on 7 days off.  03/2024: HELD Revlimid  due to cytopenias.  05/21/2024: Resume Revlimid  dose reduce 2.5 mg PO QOD 21 days on 7 days off.   Interval History:  ARMONI KLUDT 68 y.o. female with medical history significant for multiple myeloma with plasmacytoma who presents for a follow up visit. The patient's last visit was on 05/21/2024.  In the  interim since her last visit she has continued to hold Revlimid .    On exam today Mrs. Risse reports she has been well overall since her last visit and is now working part-time.  She reports that she is able to read a lot and go out to lunch with her friends.  She reports her energy levels are not too bad.  She notes that she has been going to the gym.  Her energy today is about a 6 out of 10.  Her appetite remains good for the most part but does come and go.  She reports that she is on a good streak of eating well.  She reports that she has had no issues with runny nose, sore throat, cough.  She denies any fevers, chills, sweats.  She has been having some issues with her arm and has a neuropathic burning sensation.  She reports it is mostly at night.  She will be looking into having a neurologist to evaluate this.  She reports overall she has been feeling good.  She has had no other complaints.  She is tolerating her Revlimid  without difficulty and is taking 2.5 mg every other day.  She reports that she just finished her week off and is restarting the medication.  A full 10 point ROS is otherwise negative.   MEDICAL HISTORY:  Past Medical History:  Diagnosis Date   Allergy    Anemia    Ankle edema    both ankles   Arthritis    hands,knees,elbows   Cancer (HCC) 2016  tumor kidney   Diabetes mellitus    type 2    Eczema    History of kidney stones    Hypothyroidism    Obesity    Sleep apnea    cpap machine setting of 3   Thyroid disease     SURGICAL HISTORY: Past Surgical History:  Procedure Laterality Date   BREAST BIOPSY Right 2001   CHOLECYSTECTOMY  2009   open   COLONOSCOPY     CYSTOSCOPY W/ RETROGRADES Left 11/22/2018   Procedure: CYSTOSCOPY WITH RETROGRADE PYELOGRAM;  Surgeon: Cam Morene ORN, MD;  Location: [facility];  Service: Urology;  Laterality: Left;   CYSTOSCOPY WITH RETROGRADE PYELOGRAM, URETEROSCOPY AND STENT PLACEMENT Right 10/03/2021   Procedure: CYSTOSCOPY  WITH RETROGRADE PYELOGRAM, URETEROSCOPY,STONE EXTRACTION AND STENT PLACEMENT;  Surgeon: Devere Lonni Righter, MD;  Location: [facility];  Service: Urology;  Laterality: Right;   DILATATION & CURRETTAGE/HYSTEROSCOPY WITH RESECTOCOPE N/A 01/21/2013   Procedure: DILATATION & CURETTAGE/HYSTEROSCOPY WITH RESECTOCOPE AND RESECTION OF ENDOMETRIAL;  Surgeon: Carlin ORN Forbes, MD;  Location: [facility];  Service: Gynecology;  Laterality: N/A;   HARDWARE REMOVAL N/A 06/05/2022   Procedure: Removal of Posterior Cervical Hardware from Occiput to Cervical Five;  Surgeon: Cheryle Debby LABOR, MD;  Location: [facility] OR;  Service: Neurosurgery;  Laterality: N/A;   IR CV LINE INJECTION  05/22/2024   IR IMAGING GUIDED PORT INSERTION  02/01/2022   KNEE ARTHROSCOPY     left    POLYPECTOMY     POSTERIOR CERVICAL FUSION/FORAMINOTOMY N/A 08/25/2021   Procedure: Occiput to Cervical 5 Posterior cervical instrumented fusion with open biopsy of Cervical 2 Mass. Cervical Two Ganglionectomy;  Surgeon: Cheryle Debby LABOR, MD;  Location: [facility];  Service: Neurosurgery;  Laterality: N/A;   ROBOT ASSISTED PYELOPLASTY Left 11/22/2018   Procedure: XI ROBOTIC ASSISTED ATTEMPTED PYELOPLASTY CONVERTED TO NEPHRECTOMY/LYSIS OF ADHESIONS/UMBILICAL HERNIA REPAIR;  Surgeon: Cam Morene ORN, MD;  Location: [facility];  Service: Urology;  Laterality: Left;   ROBOTIC ASSITED PARTIAL NEPHRECTOMY Left 06/11/2015   Procedure: LEFT ROBOTIC ASSISTED LAPAROSCOPIC  PARTIAL NEPHRECTOMY;  Surgeon: Morene ORN Cam, MD;  Location: [facility];  Service: Urology;  Laterality: Left;   ROTATOR CUFF REPAIR Left 2011   URETERAL BIOPSY Left 02/14/2017   Procedure: URETERAL BIOPSY;  Surgeon: Morene ORN Cam, MD;  Location: [facility];  Service: Urology;  Laterality: Left;   URETEROSCOPY WITH HOLMIUM LASER LITHOTRIPSY Left 02/14/2017   Procedure: URETEROSCOPY LITHOTRIPSY, URETERAL BALLOON DILATION;  Surgeon: Morene ORN Cam, MD;  Location: [facility];  Service:  Urology;  Laterality: Left;  With STENT    SOCIAL HISTORY: Social History   Socioeconomic History   Marital status: Married    Spouse name: Not on file   Number of children: Not on file   Years of education: Not on file   Highest education level: Not on file  Occupational History   Not on file  Tobacco Use   Smoking status: Never   Smokeless tobacco: Never  Vaping Use   Vaping status: Never Used  Substance and Sexual Activity   Alcohol use: Yes    Comment: occ   Drug use: No   Sexual activity: Not on file  Other Topics Concern   Not on file  Social History Narrative   Not on file   Social Drivers of Health   Financial Resource Strain: Low Risk  (12/24/2023)   Received from [facility]   Overall Financial Resource Strain (CARDIA)    Difficulty of Paying  Living Expenses: Not very hard  Food Insecurity: No Food Insecurity (12/24/2023)   Received from [facility]   Hunger Vital Sign    Within the past 12 months, you worried that your food would run out before you got the money to buy more.: Never true    Within the past 12 months, the food you bought just didn't last and you didn't have money to get more.: Never true  Transportation Needs: No Transportation Needs (12/24/2023)   Received from [facility]   PRAPARE - Transportation    Lack of Transportation (Medical): No    Lack of Transportation (Non-Medical): No  Physical Activity: Unknown (12/24/2023)   Received from [facility]   Exercise Vital Sign    On average, how many days per week do you engage in moderate to strenuous exercise (like a brisk walk)?: 0 days    Minutes of Exercise per Session: Not on file  Stress: Stress Concern Present (12/24/2023)   Received from [facility] of Occupational Health - Occupational Stress Questionnaire    Feeling of Stress : To some extent  Social Connections: Socially Integrated (12/24/2023)   Received from [facility]   Social Network    How would you  rate your social network (family, work, friends)?: Good participation with social networks  Intimate Partner Violence: Not At Risk (12/24/2023)   Received from [facility]   HITS    Over the last 12 months how often did your partner physically hurt you?: Never    Over the last 12 months how often did your partner insult you or talk down to you?: Never    Over the last 12 months how often did your partner threaten you with physical harm?: Never    Over the last 12 months how often did your partner scream or curse at you?: Never    FAMILY HISTORY: Family History  Problem Relation Age of Onset   Cancer Father     ALLERGIES:  is allergic to morphine .  MEDICATIONS:  Current Outpatient Medications  Medication Sig Dispense Refill   Ascorbic Acid (VITAMIN C ADULT GUMMIES PO) Take 1 tablet by mouth daily.     ASPIRIN  LOW DOSE 81 MG tablet TAKE 1 TABLET (81 MG TOTAL) BY MOUTH DAILY. SWALLOW WHOLE. 90 tablet 3   atorvastatin  (LIPITOR) 20 MG tablet Take 20 mg by mouth at bedtime.     Calcium  Carbonate-Vitamin D  600-5 MG-MCG CAPS Take 1 capsule by mouth every morning.     Cholecalciferol (VITAMIN D3 PO) Take by mouth.     Cyanocobalamin (VITAMIN B12) 1000 MCG TBCR Take 1 tablet by mouth every morning.     furosemide  (LASIX ) 20 MG tablet Take 20 mg by mouth daily.     KLOR-CON  M20 20 MEQ tablet TAKE 1 TABLET BY MOUTH EVERY DAY 30 tablet 1   lenalidomide  (REVLIMID ) 2.5 MG capsule Take 1 capsule (2.5 mg total) by mouth every other day. [employer] Auth #[id number]      Date Obtained 06/25/2024 take for 21 days and then none for 7 days. 11 capsule 0   levothyroxine  (SYNTHROID ) 50 MCG tablet Take 50 mcg by mouth daily before breakfast.     lidocaine -prilocaine  (EMLA ) cream Apply 1 Application topically as needed. 30 g 0   Magnesium Hydroxide (MILK OF MAGNESIA PO) Take 15 mLs by mouth daily as needed (constipation/diarrhea).     ondansetron  (ZOFRAN ) 8 MG tablet Take 1 tablet (8 mg total) by mouth every 8  (  eight) hours as needed. 30 tablet 0   OZEMPIC , 0.25 OR 0.5 MG/DOSE, 2 MG/1.5ML SOPN Inject 0.5 mg into the skin every Saturday.     prednisoLONE acetate (PRED FORTE) 1 % ophthalmic suspension Place 1 drop into the left eye 4 (four) times daily.     PROAIR  HFA 108 (90 BASE) MCG/ACT inhaler Inhale 2 puffs into the lungs every 4 (four) hours as needed for wheezing or shortness of breath. Uses seasonal  3   prochlorperazine  (COMPAZINE ) 10 MG tablet Take 1 tablet (10 mg total) by mouth every 6 (six) hours as needed for nausea or vomiting. 30 tablet 0   PROLENSA 0.07 % SOLN Place 1 drop into the right eye at bedtime.     Sennosides (SENOKOT PO) Take 1 tablet by mouth daily as needed (constipation). Bid     No current facility-administered medications for this visit.    REVIEW OF SYSTEMS:   Constitutional: ( - ) fevers, ( - )  chills , ( - ) night sweats Eyes: ( - ) blurriness of vision, ( - ) double vision, ( - ) watery eyes Ears, nose, mouth, throat, and face: ( - ) mucositis, ( - ) sore throat Respiratory: ( - ) cough, ( - ) dyspnea, ( - ) wheezes Cardiovascular: ( - ) palpitation, ( - ) chest discomfort, ( - ) lower extremity swelling Gastrointestinal:  ( - ) nausea, ( - ) heartburn, ( -) change in bowel habits Skin: ( - ) abnormal skin rashes Lymphatics: ( - ) new lymphadenopathy, ( - ) easy bruising Neurological: ( - ) numbness, ( - ) tingling, ( - ) new weaknesses Behavioral/Psych: ( - ) mood change, ( - ) new changes  All other systems were reviewed with the patient and are negative.  PHYSICAL EXAMINATION: ECOG PERFORMANCE STATUS: 0 - Asymptomatic  Vitals:   07/17/24 0826  BP: 126/81  Pulse: 73  Resp: 14  Temp: (!) 97.3 F (36.3 C)  SpO2: 100%        Filed Weights   07/17/24 0826  Weight: 202 lb (91.6 kg)     GENERAL: Well-appearing middle-age Caucasian female alert, no distress and comfortable SKIN: No evidence of rash, erythema, dry skin, or raised lesions. EYES:  conjunctiva are pink and non-injected, sclera clear LUNGS: clear to auscultation and percussion with normal breathing effort HEART: regular rate & rhythm and no murmurs and no lower extremity edema Musculoskeletal: no cyanosis of digits and no clubbing  PSYCH: alert & oriented x 3, fluent speech NEURO: no focal motor/sensory deficits  LABORATORY DATA:  I have reviewed the data as listed    Latest Ref Rng & Units 07/17/2024    8:04 AM 06/19/2024    7:40 AM 05/21/2024    2:59 PM  CBC  WBC 4.0 - 10.5 K/uL 3.1  2.6  3.2   Hemoglobin 12.0 - 15.0 g/dL 87.2  87.5  86.6   Hematocrit 36.0 - 46.0 % 35.3  35.6  37.2   Platelets 150 - 400 K/uL 141  134  146        Latest Ref Rng & Units 07/17/2024    8:04 AM 06/19/2024    7:40 AM 05/21/2024    2:59 PM  CMP  Glucose 70 - 99 mg/dL 893  891  894   BUN 8 - 23 mg/dL 12  16  17    Creatinine 0.44 - 1.00 mg/dL 8.99  9.01  8.91   Sodium 135 - 145 mmol/L  142  144  144   Potassium 3.5 - 5.1 mmol/L 4.0  4.1  3.8   Chloride 98 - 111 mmol/L 111  113  108   CO2 22 - 32 mmol/L 25  23  29    Calcium  8.9 - 10.3 mg/dL 8.7  8.4  9.5   Total Protein 6.5 - 8.1 g/dL 5.8  6.1  6.7   Total Bilirubin 0.0 - 1.2 mg/dL 0.9  0.9  1.2   Alkaline Phos 38 - 126 U/L 66  70  59   AST 15 - 41 U/L 13  14  20    ALT 0 - 44 U/L 13  14  19      Lab Results  Component Value Date   MPROTEIN Not Observed 06/19/2024   MPROTEIN Not Observed 05/21/2024   MPROTEIN Not Observed 04/24/2024   Lab Results  Component Value Date   KPAFRELGTCHN 11.9 06/19/2024   KPAFRELGTCHN 11.2 05/21/2024   KPAFRELGTCHN 12.0 04/24/2024   LAMBDASER 12.4 06/19/2024   LAMBDASER 11.7 05/21/2024   LAMBDASER 12.3 04/24/2024   KAPLAMBRATIO 0.96 06/19/2024   KAPLAMBRATIO 0.96 05/21/2024   KAPLAMBRATIO 0.98 04/24/2024    RADIOGRAPHIC STUDIES: No results found.  ASSESSMENT & PLAN KAYCI BELLEVILLE is a 68 y.o. female with medical history significant for multiple myeloma with plasmacytoma who presents for a  follow up visit.   # Free Kappa Multiple Myeloma # Plasmacytoma  -- original finding of L2 plasmacytoma on biopsy of back lesion, bone marrow biopsy confirms greater than 60% plasma cells consistent with multiple myeloma. --Cycle 1 Day 1 of VRD treatment started on 10/26/2021.  --VRD therapy stopped on 11/25/2021 due to rash. Started Dara/Rev/Dex on 12/16/2021.  --will order monthly SPEP, UPEP, SFLC and beta 2 microglobulin --weekly CBC, CMP, and LDH --Cycle 1 Day 1 of Kd started on 01/06/2022 Plan:  --Labs show white blood cell 3.1, Hgb 12.7, MCV 87.2, Plt 141  --Most recent myeloma labs from 06/19/2024 showed no M-protein was undetectable with normal serum free light chains. Continue monthly protein labs with SPEP and serum free light chains. --Since blood counts have recovered, we will restart with dose reduced Revlimid  2.5 mg PO QOD, 21 days on 7 days off.  --RTC q 4 weeks for labs and 8 weeks for clinic visit   #Neck pain: --Present for the last month involving more the left lateral side with decreased mobility --CT cervical spine did not show any acute abnormalities on 04/19/2023.   #Supportive Care -- chemotherapy education complete  -- port placement complete. -- sent prescription for EMLA  cream -- zofran  8mg  q8H PRN and compazine  10mg  PO q6H for nausea -- Started zometa  on 02/03/2022. Will continue q 3 months. Next due today, due again in Nov/Dec 2025.  -- no pain medication required at this time.   No orders of the defined types were placed in this encounter.  All questions were answered. The patient knows to call the clinic with any problems, questions or concerns.  I have spent a total of 30 minutes minutes of face-to-face and non-face-to-face time, preparing to see the patient, performing a medically appropriate examination, counseling and educating the patient, ordering medications/tests, documenting clinical information in the electronic health record, and care coordination.    Norleen IVAR Kidney, MD Department of Hematology/Oncology [facility] Phone: [phone] Pager: [phone] Email: [email or web address]  07/17/2024 9:02 AM

## 2024-07-18 LAB — KAPPA/LAMBDA LIGHT CHAINS
Kappa free light chain: 15.7 mg/L (ref 3.3–19.4)
Kappa, lambda light chain ratio: 1.11 (ref 0.26–1.65)
Lambda free light chains: 14.2 mg/L (ref 5.7–26.3)

## 2024-07-23 LAB — MULTIPLE MYELOMA PANEL, SERUM
Albumin SerPl Elph-Mcnc: 3.5 g/dL (ref 2.9–4.4)
Albumin/Glob SerPl: 1.6 (ref 0.7–1.7)
Alpha 1: 0.2 g/dL (ref 0.0–0.4)
Alpha2 Glob SerPl Elph-Mcnc: 0.6 g/dL (ref 0.4–1.0)
B-Globulin SerPl Elph-Mcnc: 0.9 g/dL (ref 0.7–1.3)
Gamma Glob SerPl Elph-Mcnc: 0.5 g/dL (ref 0.4–1.8)
Globulin, Total: 2.2 g/dL (ref 2.2–3.9)
IgA: 84 mg/dL — ABNORMAL LOW (ref 87–352)
IgG (Immunoglobin G), Serum: 511 mg/dL — ABNORMAL LOW (ref 586–1602)
IgM (Immunoglobulin M), Srm: 31 mg/dL (ref 26–217)
Total Protein ELP: 5.7 g/dL — ABNORMAL LOW (ref 6.0–8.5)

## 2024-07-28 ENCOUNTER — Other Ambulatory Visit: Payer: Self-pay | Admitting: Hematology and Oncology

## 2024-07-28 ENCOUNTER — Other Ambulatory Visit: Payer: Self-pay | Admitting: *Deleted

## 2024-07-28 MED ORDER — LENALIDOMIDE 2.5 MG PO CAPS: 2.5000 mg | ORAL_CAPSULE | ORAL | 0 refills | Status: DC

## 2024-08-14 ENCOUNTER — Inpatient Hospital Stay

## 2024-08-14 ENCOUNTER — Inpatient Hospital Stay: Attending: Radiation Oncology

## 2024-08-14 DIAGNOSIS — C9000 Multiple myeloma not having achieved remission: Secondary | ICD-10-CM | POA: Diagnosis present

## 2024-08-14 DIAGNOSIS — C9001 Multiple myeloma in remission: Secondary | ICD-10-CM

## 2024-08-14 LAB — CBC WITH DIFFERENTIAL (CANCER CENTER ONLY)
Abs Immature Granulocytes: 0.02 K/uL (ref 0.00–0.07)
Basophils Absolute: 0 K/uL (ref 0.0–0.1)
Basophils Relative: 1 %
Eosinophils Absolute: 0.1 K/uL (ref 0.0–0.5)
Eosinophils Relative: 2 %
HCT: 35.7 % — ABNORMAL LOW (ref 36.0–46.0)
Hemoglobin: 12.8 g/dL (ref 12.0–15.0)
Immature Granulocytes: 1 %
Lymphocytes Relative: 35 %
Lymphs Abs: 1.3 K/uL (ref 0.7–4.0)
MCH: 31.1 pg (ref 26.0–34.0)
MCHC: 35.9 g/dL (ref 30.0–36.0)
MCV: 86.9 fL (ref 80.0–100.0)
Monocytes Absolute: 0.3 K/uL (ref 0.1–1.0)
Monocytes Relative: 8 %
Neutro Abs: 2 K/uL (ref 1.7–7.7)
Neutrophils Relative %: 53 %
Platelet Count: 129 K/uL — ABNORMAL LOW (ref 150–400)
RBC: 4.11 MIL/uL (ref 3.87–5.11)
RDW: 13.2 % (ref 11.5–15.5)
WBC Count: 3.7 K/uL — ABNORMAL LOW (ref 4.0–10.5)
nRBC: 0 % (ref 0.0–0.2)

## 2024-08-14 LAB — CMP (CANCER CENTER ONLY)
ALT: 14 U/L (ref 0–44)
AST: 15 U/L (ref 15–41)
Albumin: 4.1 g/dL (ref 3.5–5.0)
Alkaline Phosphatase: 76 U/L (ref 38–126)
Anion gap: 5 (ref 5–15)
BUN: 18 mg/dL (ref 8–23)
CO2: 26 mmol/L (ref 22–32)
Calcium: 8.8 mg/dL — ABNORMAL LOW (ref 8.9–10.3)
Chloride: 111 mmol/L (ref 98–111)
Creatinine: 1.01 mg/dL — ABNORMAL HIGH (ref 0.44–1.00)
GFR, Estimated: >60 mL/min (ref >60)
Glucose, Bld: 106 mg/dL — ABNORMAL HIGH (ref 70–99)
Potassium: 4.3 mmol/L (ref 3.5–5.1)
Sodium: 142 mmol/L (ref 135–145)
Total Bilirubin: 0.7 mg/dL (ref 0.0–1.2)
Total Protein: 6.1 g/dL — ABNORMAL LOW (ref 6.5–8.1)

## 2024-08-14 LAB — LACTATE DEHYDROGENASE: LDH: 170 U/L (ref 98–192)

## 2024-08-14 NOTE — Patient Instructions (Signed)

## 2024-08-15 LAB — KAPPA/LAMBDA LIGHT CHAINS
Kappa free light chain: 15.3 mg/L (ref 3.3–19.4)
Kappa, lambda light chain ratio: 1.07 (ref 0.26–1.65)
Lambda free light chains: 14.3 mg/L (ref 5.7–26.3)

## 2024-08-18 LAB — MULTIPLE MYELOMA PANEL, SERUM
Albumin SerPl Elph-Mcnc: 3.5 g/dL (ref 2.9–4.4)
Albumin/Glob SerPl: 1.6 (ref 0.7–1.7)
Alpha 1: 0.2 g/dL (ref 0.0–0.4)
Alpha2 Glob SerPl Elph-Mcnc: 0.6 g/dL (ref 0.4–1.0)
B-Globulin SerPl Elph-Mcnc: 0.9 g/dL (ref 0.7–1.3)
Gamma Glob SerPl Elph-Mcnc: 0.5 g/dL (ref 0.4–1.8)
Globulin, Total: 2.2 g/dL (ref 2.2–3.9)
IgA: 83 mg/dL — ABNORMAL LOW (ref 87–352)
IgG (Immunoglobin G), Serum: 530 mg/dL — ABNORMAL LOW (ref 586–1602)
IgM (Immunoglobulin M), Srm: 32 mg/dL (ref 26–217)
Total Protein ELP: 5.7 g/dL — ABNORMAL LOW (ref 6.0–8.5)

## 2024-09-02 ENCOUNTER — Other Ambulatory Visit: Payer: Self-pay | Admitting: Hematology and Oncology

## 2024-09-02 ENCOUNTER — Other Ambulatory Visit: Payer: Self-pay

## 2024-09-02 DIAGNOSIS — C9000 Multiple myeloma not having achieved remission: Secondary | ICD-10-CM

## 2024-09-02 MED ORDER — LENALIDOMIDE 2.5 MG PO CAPS: 2.5000 mg | ORAL_CAPSULE | ORAL | 0 refills | Status: DC

## 2024-09-03 ENCOUNTER — Encounter: Payer: Self-pay | Admitting: Hematology and Oncology

## 2024-09-11 ENCOUNTER — Inpatient Hospital Stay: Attending: Radiation Oncology

## 2024-09-11 ENCOUNTER — Inpatient Hospital Stay

## 2024-09-11 DIAGNOSIS — C9000 Multiple myeloma not having achieved remission: Secondary | ICD-10-CM | POA: Diagnosis present

## 2024-09-11 DIAGNOSIS — C9001 Multiple myeloma in remission: Secondary | ICD-10-CM

## 2024-09-11 LAB — CBC WITH DIFFERENTIAL (CANCER CENTER ONLY)
Abs Immature Granulocytes: 0.01 K/uL (ref 0.00–0.07)
Basophils Absolute: 0 K/uL (ref 0.0–0.1)
Basophils Relative: 1 %
Eosinophils Absolute: 0.2 K/uL (ref 0.0–0.5)
Eosinophils Relative: 6 %
HCT: 36.6 % (ref 36.0–46.0)
Hemoglobin: 12.8 g/dL (ref 12.0–15.0)
Immature Granulocytes: 0 %
Lymphocytes Relative: 33 %
Lymphs Abs: 1 K/uL (ref 0.7–4.0)
MCH: 30.2 pg (ref 26.0–34.0)
MCHC: 35 g/dL (ref 30.0–36.0)
MCV: 86.3 fL (ref 80.0–100.0)
Monocytes Absolute: 0.5 K/uL (ref 0.1–1.0)
Monocytes Relative: 16 %
Neutro Abs: 1.4 K/uL — ABNORMAL LOW (ref 1.7–7.7)
Neutrophils Relative %: 44 %
Platelet Count: 122 K/uL — ABNORMAL LOW (ref 150–400)
RBC: 4.24 MIL/uL (ref 3.87–5.11)
RDW: 13.3 % (ref 11.5–15.5)
WBC Count: 3.2 K/uL — ABNORMAL LOW (ref 4.0–10.5)
nRBC: 0 % (ref 0.0–0.2)

## 2024-09-11 LAB — CMP (CANCER CENTER ONLY)
ALT: 13 U/L (ref 0–44)
AST: 13 U/L — ABNORMAL LOW (ref 15–41)
Albumin: 4 g/dL (ref 3.5–5.0)
Alkaline Phosphatase: 79 U/L (ref 38–126)
Anion gap: 8 (ref 5–15)
BUN: 17 mg/dL (ref 8–23)
CO2: 26 mmol/L (ref 22–32)
Calcium: 9.2 mg/dL (ref 8.9–10.3)
Chloride: 108 mmol/L (ref 98–111)
Creatinine: 1.07 mg/dL — ABNORMAL HIGH (ref 0.44–1.00)
GFR, Estimated: 57 mL/min — ABNORMAL LOW (ref >60)
Glucose, Bld: 117 mg/dL — ABNORMAL HIGH (ref 70–99)
Potassium: 3.7 mmol/L (ref 3.5–5.1)
Sodium: 142 mmol/L (ref 135–145)
Total Bilirubin: 0.9 mg/dL (ref 0.0–1.2)
Total Protein: 6.4 g/dL — ABNORMAL LOW (ref 6.5–8.1)

## 2024-09-11 LAB — LACTATE DEHYDROGENASE: LDH: 173 U/L (ref 98–192)

## 2024-09-12 LAB — KAPPA/LAMBDA LIGHT CHAINS
Kappa free light chain: 17.8 mg/L (ref 3.3–19.4)
Kappa, lambda light chain ratio: 0.95 (ref 0.26–1.65)
Lambda free light chains: 18.7 mg/L (ref 5.7–26.3)

## 2024-09-15 LAB — MULTIPLE MYELOMA PANEL, SERUM
Albumin SerPl Elph-Mcnc: 3.4 g/dL (ref 2.9–4.4)
Albumin/Glob SerPl: 1.4 (ref 0.7–1.7)
Alpha 1: 0.3 g/dL (ref 0.0–0.4)
Alpha2 Glob SerPl Elph-Mcnc: 0.8 g/dL (ref 0.4–1.0)
B-Globulin SerPl Elph-Mcnc: 1 g/dL (ref 0.7–1.3)
Gamma Glob SerPl Elph-Mcnc: 0.4 g/dL (ref 0.4–1.8)
Globulin, Total: 2.6 g/dL (ref 2.2–3.9)
IgA: 88 mg/dL (ref 87–352)
IgG (Immunoglobin G), Serum: 536 mg/dL — ABNORMAL LOW (ref 586–1602)
IgM (Immunoglobulin M), Srm: 43 mg/dL (ref 26–217)
Total Protein ELP: 6 g/dL (ref 6.0–8.5)

## 2024-09-25 ENCOUNTER — Telehealth: Payer: Self-pay

## 2024-09-25 NOTE — Telephone Encounter (Signed)
 Oral Oncology Patient Advocate Encounter  Prior Authorization for Revlimid  has been approved.    PA# [id number] Effective dates: 09/25/24 through 09/25/25   Lucie Lamer, CPhT [facility] Oncology Pharmacy Patient Advocate Specialist [facility] [place] Phone: [phone]  Fax: [phone] [email or web address]

## 2024-09-25 NOTE — Telephone Encounter (Signed)
 Oral Oncology Patient Advocate Encounter   Received notification that prior authorization for Revlimid  is required.   PA submitted on 09/25/24 Key [id number] Status is pending     Lucie Lamer, CPhT [place]  [facility] Oncology Pharmacy Patient Advocate Specialist [facility] [place] Phone: [phone]  Fax: [phone] [email or web address]

## 2024-09-29 ENCOUNTER — Other Ambulatory Visit: Payer: Self-pay | Admitting: Hematology and Oncology

## 2024-09-29 ENCOUNTER — Other Ambulatory Visit: Payer: Self-pay | Admitting: *Deleted

## 2024-09-29 DIAGNOSIS — C9000 Multiple myeloma not having achieved remission: Secondary | ICD-10-CM

## 2024-09-29 MED ORDER — LENALIDOMIDE 2.5 MG PO CAPS: 2.5000 mg | ORAL_CAPSULE | ORAL | 0 refills | Status: DC

## 2024-10-09 NOTE — Progress Notes (Signed)
 [facility] [phone]   [phone]  PROGRESS NOTE  Patient Care Team: Vaughn Lauraine KATHEE DEVONNA as PCP - General (Physician Assistant)  Hematological/Oncological History # Plasmacytoma with Multiple Myeloma  07/29/2021: CT cervical spine shows expansile lytic lesion which almost completely replaces the normal C2 vertebral body  08/24/2021: PET CT scan shows increased metabolic activity at C2 and C7  08/25/2021: biopsy of C2 lesion showed finding consistent with plasma cell neoplasm.  09/08/2021: start radiation therapy 09/12/2021: establish care with Dr. Federico  09/28/2021: end radiation therapy 10/10/2021: Bone marrow biopsy. Results confirm plasma cell neoplasm consistent with multiple myeloma, kappa restricted.  10/26/2021: Cycle 1 Day 1 of VRD 11/18/2021: Cycle 2 Day 1 of VRD 11/25/2021: Velcade  HELD due to full body rash. Resolved with steroid therapy.  12/16/2021: Cycle 1 Day 1 of Dara/Rev/Dex 12/30/2021: HELD Daratumumab  due to toxicity. Patient unable to tolerate. Requested holding the medication  01/06/2022: Cycle 1 Day 1 of Kd  02/03/2022: Cycle 2 Day 1 of Kd 03/03/2022: Cycle 3 Day 1 of Kd 03/31/2022: Cycle 4 Day 1 of Kd 04/28/2022: Cycle 5 Day 1 of Kd 05/26/2022: Cycle 6 Day 1 of Kd  06/30/2022: Cycle 7 Day 1 of Kd  07/20/2022: Cycle 8 Day 1 of Kd  Sept 2023: start of maintenance revlimid  10 mg PO daily 21 of 28 day cycles.  01/05/2023: HELD Revlimid  due to cytopenias. 06/07/2023: restart maintenance revlimid  5 mg PO daily 21 days of 28 days.  09/07/2023: Dose reduce maintenance revlimid  to 2.5 mg PO daily 21 days on 7 days off.  03/2024: HELD Revlimid  due to cytopenias.  05/21/2024: Resume Revlimid  dose reduce 2.5 mg PO QOD 21 days on 7 days off.   Interval History:  Carol [name] 68 y.o. female with medical history significant for multiple myeloma with plasmacytoma who presents for a follow up visit. The patient's last visit was on 07/17/2024.  In the  interim since her last visit she has continued to hold Revlimid .    On exam today Mrs. Strength reports she has been well overall in the interim since our last visit.  She did have a sinus infection that lasted for about 3 weeks time but subsequently resolved.  She reports that she is going to have a smaller Thanksgiving this year with 6 people where she normally has 12-20 people.  She reports her energy level has been okay and her appetite has been too good.  Her weight is up to 216 pounds, up from 202 at her last visit.  She reports she continues to have pain in her neck and is been working with her neurologist.  Further physical therapy is recommended but she does not feel that has been helpful.  She continues to wear her neck brace.  She notes that she is taking her Revlimid  2.5 mg faithfully and is not having any major side effects.  She denies any nausea, vomiting, diarrhea.  She reports no fevers, chills, sweats.  Full 10 point ROS otherwise negative.   MEDICAL HISTORY:  Past Medical History:  Diagnosis Date   Allergy    Anemia    Ankle edema    both ankles   Arthritis    hands,knees,elbows   Cancer (HCC) 2016   tumor kidney   Diabetes mellitus    type 2    Eczema    History of kidney stones    Hypothyroidism    Obesity    Sleep apnea    cpap machine  setting of 3   Thyroid disease     SURGICAL HISTORY: Past Surgical History:  Procedure Laterality Date   BREAST BIOPSY Right 2001   CHOLECYSTECTOMY  2009   open   COLONOSCOPY     CYSTOSCOPY W/ RETROGRADES Left 11/22/2018   Procedure: CYSTOSCOPY WITH RETROGRADE PYELOGRAM;  Surgeon: Cam Morene ORN, MD;  Location: [facility];  Service: Urology;  Laterality: Left;   CYSTOSCOPY WITH RETROGRADE PYELOGRAM, URETEROSCOPY AND STENT PLACEMENT Right 10/03/2021   Procedure: CYSTOSCOPY WITH RETROGRADE PYELOGRAM, URETEROSCOPY,STONE EXTRACTION AND STENT PLACEMENT;  Surgeon: Devere Lonni Righter, MD;  Location: [facility];  Service: Urology;   Laterality: Right;   DILATATION & CURRETTAGE/HYSTEROSCOPY WITH RESECTOCOPE N/A 01/21/2013   Procedure: DILATATION & CURETTAGE/HYSTEROSCOPY WITH RESECTOCOPE AND RESECTION OF ENDOMETRIAL;  Surgeon: Carlin ORN Forbes, MD;  Location[facility];  Service: Gynecology;  Laterality: N/A;   HARDWARE REMOVAL N/A 06/05/2022   Procedure: Removal of Posterior Cervical Hardware from Occiput to Cervical Five;  Surgeon: Cheryle Debby LABOR, MD;  Location: [facility];  Service: Neurosurgery;  Laterality: N/A;   IR CV LINE INJECTION  05/22/2024   IR IMAGING GUIDED PORT INSERTION  02/01/2022   KNEE ARTHROSCOPY     left    POLYPECTOMY     POSTERIOR CERVICAL FUSION/FORAMINOTOMY N/A 08/25/2021   Procedure: Occiput to Cervical 5 Posterior cervical instrumented fusion with open biopsy of Cervical 2 Mass. Cervical Two Ganglionectomy;  Surgeon: Cheryle Debby LABOR, MD;  Location: [facility];  Service: Neurosurgery;  Laterality: N/A;   ROBOT ASSISTED PYELOPLASTY Left 11/22/2018   Procedure: XI ROBOTIC ASSISTED ATTEMPTED PYELOPLASTY CONVERTED TO NEPHRECTOMY/LYSIS OF ADHESIONS/UMBILICAL HERNIA REPAIR;  Surgeon: Cam Morene ORN, MD;  Location: [facility];  Service: Urology;  Laterality: Left;   ROBOTIC ASSITED PARTIAL NEPHRECTOMY Left 06/11/2015   Procedure: LEFT ROBOTIC ASSISTED LAPAROSCOPIC  PARTIAL NEPHRECTOMY;  Surgeon: Morene ORN Cam, MD;  Location: [facility];  Service: Urology;  Laterality: Left;   ROTATOR CUFF REPAIR Left 2011   URETERAL BIOPSY Left 02/14/2017   Procedure: URETERAL BIOPSY;  Surgeon: Morene ORN Cam, MD;  Location: [facility];  Service: Urology;  Laterality: Left;   URETEROSCOPY WITH HOLMIUM LASER LITHOTRIPSY Left 02/14/2017   Procedure: URETEROSCOPY LITHOTRIPSY, URETERAL BALLOON DILATION;  Surgeon: Morene ORN Cam, MD;  Location: [facility];  Service: Urology;  Laterality: Left;  With STENT    SOCIAL HISTORY: Social History   Socioeconomic History   Marital status: Married    Spouse name: Not on file    Number of children: Not on file   Years of education: Not on file   Highest education level: Not on file  Occupational History   Not on file  Tobacco Use   Smoking status: Never   Smokeless tobacco: Never  Vaping Use   Vaping status: Never Used  Substance and Sexual Activity   Alcohol use: Yes    Comment: occ   Drug use: No   Sexual activity: Not on file  Other Topics Concern   Not on file  Social History Narrative   Not on file   Social Drivers of Health   Financial Resource Strain: Low Risk  (12/24/2023)   Received from [facility]   Overall Financial Resource Strain (CARDIA)    Difficulty of Paying Living Expenses: Not very hard  Food Insecurity: No Food Insecurity (12/24/2023)   Received from [facility]   Hunger Vital Sign    Within the past 12 months, you worried that your food would run out before  you got the money to buy more.: Never true    Within the past 12 months, the food you bought just didn't last and you didn't have money to get more.: Never true  Transportation Needs: No Transportation Needs (12/24/2023)   Received from [facility]   PRAPARE - Transportation    Lack of Transportation (Medical): No    Lack of Transportation (Non-Medical): No  Physical Activity: Unknown (12/24/2023)   Received from [facility]   Exercise Vital Sign    On average, how many days per week do you engage in moderate to strenuous exercise (like a brisk walk)?: 0 days    Minutes of Exercise per Session: Not on file  Stress: Stress Concern Present (12/24/2023)   Received from [facility] - Occupational Stress Questionnaire    Feeling of Stress : To some extent  Social Connections: Socially Integrated (12/24/2023)   Received from [facility]   Social Network    How would you rate your social network (family, work, friends)?: Good participation with social networks  Intimate Partner Violence: Not At Risk (12/24/2023)   Received from  [facility]   HITS    Over the last 12 months how often did your partner physically hurt you?: Never    Over the last 12 months how often did your partner insult you or talk down to you?: Never    Over the last 12 months how often did your partner threaten you with physical harm?: Never    Over the last 12 months how often did your partner scream or curse at you?: Never    FAMILY HISTORY: Family History  Problem Relation Age of Onset   Cancer Father     ALLERGIES:  is allergic to morphine .  MEDICATIONS:  Current Outpatient Medications  Medication Sig Dispense Refill   Ascorbic Acid (VITAMIN C ADULT GUMMIES PO) Take 1 tablet by mouth daily.     ASPIRIN  LOW DOSE 81 MG tablet TAKE 1 TABLET (81 MG TOTAL) BY MOUTH DAILY. SWALLOW WHOLE. 90 tablet 3   atorvastatin  (LIPITOR) 20 MG tablet Take 20 mg by mouth at bedtime.     Calcium  Carbonate-Vitamin D  600-5 MG-MCG CAPS Take 1 capsule by mouth every morning.     Cholecalciferol (VITAMIN D3 PO) Take by mouth.     Cyanocobalamin (VITAMIN B12) 1000 MCG TBCR Take 1 tablet by mouth every morning.     furosemide  (LASIX ) 20 MG tablet Take 20 mg by mouth daily.     KLOR-CON  M20 20 MEQ tablet TAKE 1 TABLET BY MOUTH EVERY DAY 30 tablet 1   lenalidomide  (REVLIMID ) 2.5 MG capsule Take 1 capsule (2.5 mg total) by mouth every other day. Auth #  [id number]  Date Obtained 09/29/2024. Take for 21 days and then none for 7 days. 11 capsule 0   levothyroxine  (SYNTHROID ) 50 MCG tablet Take 50 mcg by mouth daily before breakfast.     lidocaine -prilocaine  (EMLA ) cream Apply 1 Application topically as needed. 30 g 0   Magnesium Hydroxide (MILK OF MAGNESIA PO) Take 15 mLs by mouth daily as needed (constipation/diarrhea).     ondansetron  (ZOFRAN ) 8 MG tablet Take 1 tablet (8 mg total) by mouth every 8 (eight) hours as needed. 30 tablet 0   OZEMPIC , 0.25 OR 0.5 MG/DOSE, 2 MG/1.5ML SOPN Inject 0.5 mg into the skin every Saturday.     prednisoLONE acetate (PRED FORTE) 1 %  ophthalmic suspension Place 1 drop into the left  eye 4 (four) times daily.     PROAIR  HFA 108 (90 BASE) MCG/ACT inhaler Inhale 2 puffs into the lungs every 4 (four) hours as needed for wheezing or shortness of breath. Uses seasonal  3   prochlorperazine  (COMPAZINE ) 10 MG tablet Take 1 tablet (10 mg total) by mouth every 6 (six) hours as needed for nausea or vomiting. 30 tablet 0   PROLENSA 0.07 % SOLN Place 1 drop into the right eye at bedtime.     Sennosides (SENOKOT PO) Take 1 tablet by mouth daily as needed (constipation). Bid     No current facility-administered medications for this visit.    REVIEW OF SYSTEMS:   Constitutional: ( - ) fevers, ( - )  chills , ( - ) night sweats Eyes: ( - ) blurriness of vision, ( - ) double vision, ( - ) watery eyes Ears, nose, mouth, throat, and face: ( - ) mucositis, ( - ) sore throat Respiratory: ( - ) cough, ( - ) dyspnea, ( - ) wheezes Cardiovascular: ( - ) palpitation, ( - ) chest discomfort, ( - ) lower extremity swelling Gastrointestinal:  ( - ) nausea, ( - ) heartburn, ( -) change in bowel habits Skin: ( - ) abnormal skin rashes Lymphatics: ( - ) new lymphadenopathy, ( - ) easy bruising Neurological: ( - ) numbness, ( - ) tingling, ( - ) new weaknesses Behavioral/Psych: ( - ) mood change, ( - ) new changes  All other systems were reviewed with the patient and are negative.  PHYSICAL EXAMINATION: ECOG PERFORMANCE STATUS: 0 - Asymptomatic  Vitals:   10/10/24 0845  BP: 127/62  Pulse: 78  Resp: 13  Temp: 97.7 F (36.5 C)  SpO2: 100%         Filed Weights   10/10/24 0845  Weight: 216 lb (98 kg)      GENERAL: Well-appearing middle-age Caucasian female alert, no distress and comfortable SKIN: No evidence of rash, erythema, dry skin, or raised lesions. EYES: conjunctiva are pink and non-injected, sclera clear LUNGS: clear to auscultation and percussion with normal breathing effort HEART: regular rate & rhythm and no murmurs and  no lower extremity edema Musculoskeletal: no cyanosis of digits and no clubbing  PSYCH: alert & oriented x 3, fluent speech NEURO: no focal motor/sensory deficits  LABORATORY DATA:  I have reviewed the data as listed    Latest Ref Rng & Units 10/10/2024    8:19 AM 09/11/2024    7:30 AM 08/14/2024    7:39 AM  CBC  WBC 4.0 - 10.5 K/uL 3.6  3.2  3.7   Hemoglobin 12.0 - 15.0 g/dL 86.3  87.1  87.1   Hematocrit 36.0 - 46.0 % 38.7  36.6  35.7   Platelets 150 - 400 K/uL 124  122  129        Latest Ref Rng & Units 10/10/2024    8:19 AM 09/11/2024    7:30 AM 08/14/2024    7:39 AM  CMP  Glucose 70 - 99 mg/dL 854  882  893   BUN 8 - 23 mg/dL 19  17  18    Creatinine 0.44 - 1.00 mg/dL 8.91  8.92  8.98   Sodium 135 - 145 mmol/L 142  142  142   Potassium 3.5 - 5.1 mmol/L 3.9  3.7  4.3   Chloride 98 - 111 mmol/L 107  108  111   CO2 22 - 32 mmol/L 25  26  26  Calcium  8.9 - 10.3 mg/dL 9.2  9.2  8.8   Total Protein 6.5 - 8.1 g/dL 6.3  6.4  6.1   Total Bilirubin 0.0 - 1.2 mg/dL 0.9  0.9  0.7   Alkaline Phos 38 - 126 U/L 81  79  76   AST 15 - 41 U/L 21  13  15    ALT 0 - 44 U/L 17  13  14      Lab Results  Component Value Date   MPROTEIN Not Observed 09/11/2024   MPROTEIN Not Observed 08/14/2024   MPROTEIN Not Observed 07/17/2024   Lab Results  Component Value Date   KPAFRELGTCHN 17.8 09/11/2024   KPAFRELGTCHN 15.3 08/14/2024   KPAFRELGTCHN 15.7 07/17/2024   LAMBDASER 18.7 09/11/2024   LAMBDASER 14.3 08/14/2024   LAMBDASER 14.2 07/17/2024   KAPLAMBRATIO 0.95 09/11/2024   KAPLAMBRATIO 1.07 08/14/2024   KAPLAMBRATIO 1.11 07/17/2024    RADIOGRAPHIC STUDIES: No results found.  ASSESSMENT & PLAN Carol [name] is a 68 y.o. female with medical history significant for multiple myeloma with plasmacytoma who presents for a follow up visit.   # Free Kappa Multiple Myeloma # Plasmacytoma  -- original finding of L2 plasmacytoma on biopsy of back lesion, bone marrow biopsy confirms  greater than 60% plasma cells consistent with multiple myeloma. --Cycle 1 Day 1 of VRD treatment started on 10/26/2021.  --VRD therapy stopped on 11/25/2021 due to rash. Started Dara/Rev/Dex on 12/16/2021.  --will order monthly SPEP, UPEP, SFLC and beta 2 microglobulin --weekly CBC, CMP, and LDH --Cycle 1 Day 1 of Kd started on 01/06/2022 Plan:  --Labs show white blood cell 3.6, Hgb 13.6, MCV 86.8, Plt 124  --Most recent myeloma labs from 09/11/2024 showed no M-protein was undetectable with normal serum free light chains. Continue monthly protein labs with SPEP and serum free light chains. --Since blood counts have recovered, we will restart with dose reduced Revlimid  2.5 mg PO QOD, 21 days on 7 days off.  --RTC q 4 weeks for labs and 8 weeks for clinic visit   #Neck pain: --Present for the last month involving more the left lateral side with decreased mobility --CT cervical spine did not show any acute abnormalities on 04/19/2023.   #Supportive Care -- chemotherapy education complete  -- port placement complete. -- sent prescription for EMLA  cream -- zofran  8mg  q8H PRN and compazine  10mg  PO q6H for nausea -- Started zometa  on 02/03/2022. Will continue q 3 months. Next due today, due again in Nov/Dec 2025. Last dose to be given in Feb 2026 (will have completed 8 doses or roughly 2 years worth).  -- no pain medication required at this time.   No orders of the defined types were placed in this encounter.  All questions were answered. The patient knows to call the clinic with any problems, questions or concerns.  I have spent a total of 30 minutes minutes of face-to-face and non-face-to-face time, preparing to see the patient, performing a medically appropriate examination, counseling and educating the patient, ordering medications/tests, documenting clinical information in the electronic health record, and care coordination.   Norleen IVAR Kidney, MD Department of Hematology/Oncology [facility] Phone: [phone] Pager: [phone] Email: [email or web address]  10/10/2024 9:04 AM

## 2024-10-10 ENCOUNTER — Inpatient Hospital Stay

## 2024-10-10 ENCOUNTER — Inpatient Hospital Stay: Attending: Radiation Oncology | Admitting: Hematology and Oncology

## 2024-10-10 VITALS — BP 127/62 | HR 78 | Temp 97.7°F | Resp 13 | Wt 216.0 lb

## 2024-10-10 DIAGNOSIS — Z95828 Presence of other vascular implants and grafts: Secondary | ICD-10-CM

## 2024-10-10 DIAGNOSIS — Z885 Allergy status to narcotic agent status: Secondary | ICD-10-CM | POA: Insufficient documentation

## 2024-10-10 DIAGNOSIS — Z809 Family history of malignant neoplasm, unspecified: Secondary | ICD-10-CM | POA: Insufficient documentation

## 2024-10-10 DIAGNOSIS — C9000 Multiple myeloma not having achieved remission: Secondary | ICD-10-CM

## 2024-10-10 DIAGNOSIS — D759 Disease of blood and blood-forming organs, unspecified: Secondary | ICD-10-CM | POA: Insufficient documentation

## 2024-10-10 DIAGNOSIS — Z905 Acquired absence of kidney: Secondary | ICD-10-CM | POA: Insufficient documentation

## 2024-10-10 DIAGNOSIS — Z7982 Long term (current) use of aspirin: Secondary | ICD-10-CM | POA: Insufficient documentation

## 2024-10-10 DIAGNOSIS — Z9049 Acquired absence of other specified parts of digestive tract: Secondary | ICD-10-CM | POA: Insufficient documentation

## 2024-10-10 DIAGNOSIS — Z87442 Personal history of urinary calculi: Secondary | ICD-10-CM | POA: Insufficient documentation

## 2024-10-10 DIAGNOSIS — C9001 Multiple myeloma in remission: Secondary | ICD-10-CM

## 2024-10-10 DIAGNOSIS — M542 Cervicalgia: Secondary | ICD-10-CM | POA: Insufficient documentation

## 2024-10-10 DIAGNOSIS — J329 Chronic sinusitis, unspecified: Secondary | ICD-10-CM | POA: Insufficient documentation

## 2024-10-10 DIAGNOSIS — Z79899 Other long term (current) drug therapy: Secondary | ICD-10-CM | POA: Insufficient documentation

## 2024-10-10 LAB — CMP (CANCER CENTER ONLY)
ALT: 17 U/L (ref 0–44)
AST: 21 U/L (ref 15–41)
Albumin: 4.2 g/dL (ref 3.5–5.0)
Alkaline Phosphatase: 81 U/L (ref 38–126)
Anion gap: 10 (ref 5–15)
BUN: 19 mg/dL (ref 8–23)
CO2: 25 mmol/L (ref 22–32)
Calcium: 9.2 mg/dL (ref 8.9–10.3)
Chloride: 107 mmol/L (ref 98–111)
Creatinine: 1.08 mg/dL — ABNORMAL HIGH (ref 0.44–1.00)
GFR, Estimated: 56 mL/min — ABNORMAL LOW (ref >60)
Glucose, Bld: 145 mg/dL — ABNORMAL HIGH (ref 70–99)
Potassium: 3.9 mmol/L (ref 3.5–5.1)
Sodium: 142 mmol/L (ref 135–145)
Total Bilirubin: 0.9 mg/dL (ref 0.0–1.2)
Total Protein: 6.3 g/dL — ABNORMAL LOW (ref 6.5–8.1)

## 2024-10-10 LAB — CBC WITH DIFFERENTIAL (CANCER CENTER ONLY)
Abs Immature Granulocytes: 0.01 K/uL (ref 0.00–0.07)
Basophils Absolute: 0 K/uL (ref 0.0–0.1)
Basophils Relative: 1 %
Eosinophils Absolute: 0.1 K/uL (ref 0.0–0.5)
Eosinophils Relative: 2 %
HCT: 38.7 % (ref 36.0–46.0)
Hemoglobin: 13.6 g/dL (ref 12.0–15.0)
Immature Granulocytes: 0 %
Lymphocytes Relative: 31 %
Lymphs Abs: 1.1 K/uL (ref 0.7–4.0)
MCH: 30.5 pg (ref 26.0–34.0)
MCHC: 35.1 g/dL (ref 30.0–36.0)
MCV: 86.8 fL (ref 80.0–100.0)
Monocytes Absolute: 0.3 K/uL (ref 0.1–1.0)
Monocytes Relative: 9 %
Neutro Abs: 2 K/uL (ref 1.7–7.7)
Neutrophils Relative %: 57 %
Platelet Count: 124 K/uL — ABNORMAL LOW (ref 150–400)
RBC: 4.46 MIL/uL (ref 3.87–5.11)
RDW: 13.4 % (ref 11.5–15.5)
WBC Count: 3.6 K/uL — ABNORMAL LOW (ref 4.0–10.5)
nRBC: 0 % (ref 0.0–0.2)

## 2024-10-10 LAB — LACTATE DEHYDROGENASE: LDH: 205 U/L (ref 105–235)

## 2024-10-10 MED ORDER — SODIUM CHLORIDE 0.9 % IV SOLN: INTRAVENOUS | Status: DC

## 2024-10-10 MED ORDER — ZOLEDRONIC ACID 4 MG/100ML IV SOLN
4.0000 mg | Freq: Once | INTRAVENOUS | Status: AC
  Administered 2024-10-10: 4 mg via INTRAVENOUS
  Filled 2024-10-10: qty 100

## 2024-10-10 NOTE — Progress Notes (Signed)
 Patient here for Zometa  infusion. Calcium  and creatine within range. No dental concerns.

## 2024-10-10 NOTE — Patient Instructions (Signed)

## 2024-10-13 LAB — MULTIPLE MYELOMA PANEL, SERUM
Albumin SerPl Elph-Mcnc: 3.6 g/dL (ref 2.9–4.4)
Albumin/Glob SerPl: 1.6 (ref 0.7–1.7)
Alpha 1: 0.2 g/dL (ref 0.0–0.4)
Alpha2 Glob SerPl Elph-Mcnc: 0.7 g/dL (ref 0.4–1.0)
B-Globulin SerPl Elph-Mcnc: 1 g/dL (ref 0.7–1.3)
Gamma Glob SerPl Elph-Mcnc: 0.5 g/dL (ref 0.4–1.8)
Globulin, Total: 2.4 g/dL (ref 2.2–3.9)
IgA: 79 mg/dL — ABNORMAL LOW (ref 87–352)
IgG (Immunoglobin G), Serum: 546 mg/dL — ABNORMAL LOW (ref 586–1602)
IgM (Immunoglobulin M), Srm: 34 mg/dL (ref 26–217)
Total Protein ELP: 6 g/dL (ref 6.0–8.5)

## 2024-10-13 LAB — KAPPA/LAMBDA LIGHT CHAINS
Kappa free light chain: 13.7 mg/L (ref 3.3–19.4)
Kappa, lambda light chain ratio: 0.95 (ref 0.26–1.65)
Lambda free light chains: 14.4 mg/L (ref 5.7–26.3)

## 2024-10-22 ENCOUNTER — Other Ambulatory Visit: Payer: Self-pay | Admitting: Hematology and Oncology

## 2024-10-22 DIAGNOSIS — C9000 Multiple myeloma not having achieved remission: Secondary | ICD-10-CM

## 2024-11-06 ENCOUNTER — Inpatient Hospital Stay: Attending: Radiation Oncology

## 2024-11-06 ENCOUNTER — Inpatient Hospital Stay

## 2024-11-06 DIAGNOSIS — C9001 Multiple myeloma in remission: Secondary | ICD-10-CM

## 2024-11-06 DIAGNOSIS — C9000 Multiple myeloma not having achieved remission: Secondary | ICD-10-CM | POA: Diagnosis present

## 2024-11-06 LAB — CBC WITH DIFFERENTIAL (CANCER CENTER ONLY)
Abs Immature Granulocytes: 0.01 K/uL (ref 0.00–0.07)
Basophils Absolute: 0.1 K/uL (ref 0.0–0.1)
Basophils Relative: 2 %
Eosinophils Absolute: 0.1 K/uL (ref 0.0–0.5)
Eosinophils Relative: 2 %
HCT: 36.7 % (ref 36.0–46.0)
Hemoglobin: 13 g/dL (ref 12.0–15.0)
Immature Granulocytes: 0 %
Lymphocytes Relative: 32 %
Lymphs Abs: 1.3 K/uL (ref 0.7–4.0)
MCH: 30.4 pg (ref 26.0–34.0)
MCHC: 35.4 g/dL (ref 30.0–36.0)
MCV: 85.7 fL (ref 80.0–100.0)
Monocytes Absolute: 0.4 K/uL (ref 0.1–1.0)
Monocytes Relative: 9 %
Neutro Abs: 2.2 K/uL (ref 1.7–7.7)
Neutrophils Relative %: 55 %
Platelet Count: 140 K/uL — ABNORMAL LOW (ref 150–400)
RBC: 4.28 MIL/uL (ref 3.87–5.11)
RDW: 13.3 % (ref 11.5–15.5)
WBC Count: 4 K/uL (ref 4.0–10.5)
nRBC: 0 % (ref 0.0–0.2)

## 2024-11-06 LAB — CMP (CANCER CENTER ONLY)
ALT: 15 U/L (ref 0–44)
AST: 20 U/L (ref 15–41)
Albumin: 4.2 g/dL (ref 3.5–5.0)
Alkaline Phosphatase: 95 U/L (ref 38–126)
Anion gap: 12 (ref 5–15)
BUN: 20 mg/dL (ref 8–23)
CO2: 25 mmol/L (ref 22–32)
Calcium: 9.1 mg/dL (ref 8.9–10.3)
Chloride: 105 mmol/L (ref 98–111)
Creatinine: 1.14 mg/dL — ABNORMAL HIGH (ref 0.44–1.00)
GFR, Estimated: 52 mL/min — ABNORMAL LOW (ref >60)
Glucose, Bld: 117 mg/dL — ABNORMAL HIGH (ref 70–99)
Potassium: 3.7 mmol/L (ref 3.5–5.1)
Sodium: 141 mmol/L (ref 135–145)
Total Bilirubin: 0.9 mg/dL (ref 0.0–1.2)
Total Protein: 6.5 g/dL (ref 6.5–8.1)

## 2024-11-06 LAB — LACTATE DEHYDROGENASE: LDH: 236 U/L — ABNORMAL HIGH (ref 105–235)

## 2024-11-07 LAB — KAPPA/LAMBDA LIGHT CHAINS
Kappa free light chain: 15.6 mg/L (ref 3.3–19.4)
Kappa, lambda light chain ratio: 0.96 (ref 0.26–1.65)
Lambda free light chains: 16.3 mg/L (ref 5.7–26.3)

## 2024-11-11 LAB — MULTIPLE MYELOMA PANEL, SERUM
Albumin SerPl Elph-Mcnc: 3.6 g/dL (ref 2.9–4.4)
Albumin/Glob SerPl: 1.5 (ref 0.7–1.7)
Alpha 1: 0.3 g/dL (ref 0.0–0.4)
Alpha2 Glob SerPl Elph-Mcnc: 0.7 g/dL (ref 0.4–1.0)
B-Globulin SerPl Elph-Mcnc: 1 g/dL (ref 0.7–1.3)
Gamma Glob SerPl Elph-Mcnc: 0.5 g/dL (ref 0.4–1.8)
Globulin, Total: 2.5 g/dL (ref 2.2–3.9)
IgA: 84 mg/dL — ABNORMAL LOW (ref 87–352)
IgG (Immunoglobin G), Serum: 556 mg/dL — ABNORMAL LOW (ref 586–1602)
IgM (Immunoglobulin M), Srm: 32 mg/dL (ref 26–217)
Total Protein ELP: 6.1 g/dL (ref 6.0–8.5)

## 2024-11-17 ENCOUNTER — Other Ambulatory Visit: Payer: Self-pay

## 2024-11-17 ENCOUNTER — Other Ambulatory Visit: Payer: Self-pay | Admitting: Hematology and Oncology

## 2024-11-17 DIAGNOSIS — C9000 Multiple myeloma not having achieved remission: Secondary | ICD-10-CM

## 2024-12-04 ENCOUNTER — Inpatient Hospital Stay

## 2024-12-04 ENCOUNTER — Inpatient Hospital Stay: Attending: Radiation Oncology

## 2024-12-04 DIAGNOSIS — C9001 Multiple myeloma in remission: Secondary | ICD-10-CM

## 2024-12-04 LAB — CBC WITH DIFFERENTIAL (CANCER CENTER ONLY)
Abs Immature Granulocytes: 0.01 K/uL (ref 0.00–0.07)
Basophils Absolute: 0 K/uL (ref 0.0–0.1)
Basophils Relative: 1 %
Eosinophils Absolute: 0.1 K/uL (ref 0.0–0.5)
Eosinophils Relative: 3 %
HCT: 37.3 % (ref 36.0–46.0)
Hemoglobin: 13.2 g/dL (ref 12.0–15.0)
Immature Granulocytes: 0 %
Lymphocytes Relative: 29 %
Lymphs Abs: 1 K/uL (ref 0.7–4.0)
MCH: 30.5 pg (ref 26.0–34.0)
MCHC: 35.4 g/dL (ref 30.0–36.0)
MCV: 86.1 fL (ref 80.0–100.0)
Monocytes Absolute: 0.4 K/uL (ref 0.1–1.0)
Monocytes Relative: 10 %
Neutro Abs: 1.9 K/uL (ref 1.7–7.7)
Neutrophils Relative %: 57 %
Platelet Count: 107 K/uL — ABNORMAL LOW (ref 150–400)
RBC: 4.33 MIL/uL (ref 3.87–5.11)
RDW: 13.6 % (ref 11.5–15.5)
WBC Count: 3.4 K/uL — ABNORMAL LOW (ref 4.0–10.5)
nRBC: 0 % (ref 0.0–0.2)

## 2024-12-04 LAB — CMP (CANCER CENTER ONLY)
ALT: 17 U/L (ref 0–44)
AST: 22 U/L (ref 15–41)
Albumin: 4.3 g/dL (ref 3.5–5.0)
Alkaline Phosphatase: 98 U/L (ref 38–126)
Anion gap: 13 (ref 5–15)
BUN: 25 mg/dL — ABNORMAL HIGH (ref 8–23)
CO2: 24 mmol/L (ref 22–32)
Calcium: 9.1 mg/dL (ref 8.9–10.3)
Chloride: 104 mmol/L (ref 98–111)
Creatinine: 1.27 mg/dL — ABNORMAL HIGH (ref 0.44–1.00)
GFR, Estimated: 46 mL/min — ABNORMAL LOW
Glucose, Bld: 125 mg/dL — ABNORMAL HIGH (ref 70–99)
Potassium: 4 mmol/L (ref 3.5–5.1)
Sodium: 141 mmol/L (ref 135–145)
Total Bilirubin: 0.9 mg/dL (ref 0.0–1.2)
Total Protein: 6.5 g/dL (ref 6.5–8.1)

## 2024-12-04 LAB — LACTATE DEHYDROGENASE: LDH: 211 U/L (ref 105–235)

## 2024-12-05 LAB — KAPPA/LAMBDA LIGHT CHAINS
Kappa free light chain: 18.3 mg/L (ref 3.3–19.4)
Kappa, lambda light chain ratio: 1.24 (ref 0.26–1.65)
Lambda free light chains: 14.7 mg/L (ref 5.7–26.3)

## 2024-12-06 LAB — MULTIPLE MYELOMA PANEL, SERUM
Albumin SerPl Elph-Mcnc: 3.7 g/dL (ref 2.9–4.4)
Albumin/Glob SerPl: 1.6 (ref 0.7–1.7)
Alpha 1: 0.2 g/dL (ref 0.0–0.4)
Alpha2 Glob SerPl Elph-Mcnc: 0.6 g/dL (ref 0.4–1.0)
B-Globulin SerPl Elph-Mcnc: 0.9 g/dL (ref 0.7–1.3)
Gamma Glob SerPl Elph-Mcnc: 0.6 g/dL (ref 0.4–1.8)
Globulin, Total: 2.4 g/dL (ref 2.2–3.9)
IgA: 85 mg/dL — ABNORMAL LOW (ref 87–352)
IgG (Immunoglobin G), Serum: 557 mg/dL — ABNORMAL LOW (ref 586–1602)
IgM (Immunoglobulin M), Srm: 35 mg/dL (ref 26–217)
Total Protein ELP: 6.1 g/dL (ref 6.0–8.5)

## 2024-12-24 ENCOUNTER — Other Ambulatory Visit: Payer: Self-pay | Admitting: Hematology and Oncology

## 2024-12-24 ENCOUNTER — Other Ambulatory Visit: Payer: Self-pay | Admitting: *Deleted

## 2024-12-24 DIAGNOSIS — C9000 Multiple myeloma not having achieved remission: Secondary | ICD-10-CM

## 2024-12-24 MED ORDER — LENALIDOMIDE 2.5 MG PO CAPS: 2.5000 mg | ORAL_CAPSULE | Freq: Every day | ORAL | 0 refills | Status: AC

## 2025-01-02 ENCOUNTER — Inpatient Hospital Stay

## 2025-01-02 ENCOUNTER — Inpatient Hospital Stay: Attending: Radiation Oncology | Admitting: Hematology and Oncology
